# Patient Record
Sex: Male | Born: 1950
Health system: Southern US, Community
[De-identification: ages and names within clinical notes are randomized; demographics above are authoritative.]

## PROBLEM LIST (undated history)

## (undated) DIAGNOSIS — C9110 Chronic lymphocytic leukemia of B-cell type not having achieved remission: Secondary | ICD-10-CM

## (undated) DIAGNOSIS — D72820 Lymphocytosis (symptomatic): Secondary | ICD-10-CM

## (undated) DIAGNOSIS — N400 Enlarged prostate without lower urinary tract symptoms: Secondary | ICD-10-CM

## (undated) DIAGNOSIS — I2510 Atherosclerotic heart disease of native coronary artery without angina pectoris: Secondary | ICD-10-CM

## (undated) DIAGNOSIS — C4491 Basal cell carcinoma of skin, unspecified: Secondary | ICD-10-CM

## (undated) DIAGNOSIS — N529 Male erectile dysfunction, unspecified: Secondary | ICD-10-CM

## (undated) HISTORY — DX: Benign prostatic hyperplasia without lower urinary tract symptoms: N40.0

## (undated) HISTORY — DX: Male erectile dysfunction, unspecified: N52.9

## (undated) HISTORY — PX: LAMINECTOMY: SHX219

## (undated) HISTORY — DX: Basal cell carcinoma of skin, unspecified: C44.91

## (undated) HISTORY — PX: TONSILLECTOMY: SUR1361

---

## 2002-08-28 ENCOUNTER — Emergency Department (HOSPITAL_COMMUNITY): Admission: EM | Admit: 2002-08-28 | Discharge: 2002-08-28 | Payer: Self-pay | Admitting: Emergency Medicine

## 2002-08-28 ENCOUNTER — Encounter: Payer: Self-pay | Admitting: Emergency Medicine

## 2003-09-16 ENCOUNTER — Ambulatory Visit (HOSPITAL_COMMUNITY): Admission: RE | Admit: 2003-09-16 | Discharge: 2003-09-16 | Payer: Self-pay | Admitting: Neurosurgery

## 2006-01-03 ENCOUNTER — Ambulatory Visit (HOSPITAL_COMMUNITY): Admission: RE | Admit: 2006-01-03 | Discharge: 2006-01-03 | Payer: Self-pay | Admitting: Orthopedic Surgery

## 2013-02-07 ENCOUNTER — Ambulatory Visit (INDEPENDENT_AMBULATORY_CARE_PROVIDER_SITE_OTHER): Payer: BC Managed Care – PPO | Admitting: Physician Assistant

## 2013-02-07 VITALS — BP 130/80 | HR 80 | Temp 97.8°F | Resp 16 | Ht 74.0 in | Wt 180.0 lb

## 2013-02-07 DIAGNOSIS — R350 Frequency of micturition: Secondary | ICD-10-CM

## 2013-02-07 DIAGNOSIS — R300 Dysuria: Secondary | ICD-10-CM

## 2013-02-07 DIAGNOSIS — N390 Urinary tract infection, site not specified: Secondary | ICD-10-CM

## 2013-02-07 LAB — POCT URINALYSIS DIPSTICK
Bilirubin, UA: NEGATIVE
Glucose, UA: NEGATIVE
Ketones, UA: NEGATIVE
Nitrite, UA: NEGATIVE
Protein, UA: NEGATIVE
Spec Grav, UA: <=1.005
Urobilinogen, UA: 0.2
pH, UA: 6

## 2013-02-07 LAB — POCT UA - MICROSCOPIC ONLY
Casts, Ur, LPF, POC: NEGATIVE
Crystals, Ur, HPF, POC: NEGATIVE
Mucus, UA: NEGATIVE
Yeast, UA: NEGATIVE

## 2013-02-07 MED ORDER — CIPROFLOXACIN HCL 500 MG PO TABS: 500.0000 mg | ORAL_TABLET | Freq: Two times a day (BID) | ORAL | Status: DC

## 2013-02-07 MED ORDER — PHENAZOPYRIDINE HCL 200 MG PO TABS: 200.0000 mg | ORAL_TABLET | Freq: Three times a day (TID) | ORAL | Status: DC | PRN

## 2013-02-08 NOTE — Progress Notes (Signed)
Patient ID: Jake Kennedy MRN: [id number], DOB: 1950-10-18, 63 y.o. Date of Encounter: 02/08/2013, 11:34 AM  Primary Physician: No primary provider on file.  Chief Complaint: urinary frequency and dysuria  HPI: 63 y.o. year old male with presents with 5-6 day history of urinary frequency, dysuria, and suprapubic pressure. Denies flank pain, nausea, vomiting, fever, chills, abdominal pain. Has tried drinking cranberry juice. Believes that symptoms started after taking Tamiflu 1 week ago.  Has also taken OTC cold medications. Has had hx of difficulty urinating after taking OTC preparations - typically resolves spontaneously. He is married and has no concerns about STI's.  Patient is otherwise doing well without issues or complaints.  History reviewed. No pertinent past medical history.   Home Meds: Prior to Admission medications   Medication Sig Start Date End Date Taking? Authorizing Provider  aspirin 81 MG tablet Take 81 mg by mouth daily.   Yes Historical Provider, MD  beta carotene w/minerals (OCUVITE) tablet Take 1 tablet by mouth daily.   Yes Historical Provider, MD  Multiple Vitamin (MULTIVITAMIN) tablet Take 1 tablet by mouth daily.   Yes Historical Provider, MD  Omega-3 Fatty Acids (FISH OIL CONCENTRATE PO) Take by mouth.   Yes Historical Provider, MD  ciprofloxacin (CIPRO) 500 MG tablet Take 1 tablet (500 mg total) by mouth 2 (two) times daily. 02/07/13   Shikita Vaillancourt Elnora Morrison, PA-C  phenazopyridine (PYRIDIUM) 200 MG tablet Take 1 tablet (200 mg total) by mouth 3 (three) times daily as needed for pain. 02/07/13   Collene Leyden, PA-C    Allergies: Not on File  History   Social History  . Marital Status: Married    Spouse Name: N/A    Number of Children: N/A  . Years of Education: N/A   Occupational History  . Not on file.   Social History Main Topics  . Smoking status: Never Smoker   . Smokeless tobacco: Not on file  . Alcohol Use: Not on file  . Drug Use: Not on file  . Sexual  Activity: Not on file   Other Topics Concern  . Not on file   Social History Narrative  . No narrative on file     Review of Systems: Constitutional: negative for chills, fever, night sweats, weight changes, or fatigue  HEENT: negative for vision changes, hearing loss, congestion, rhinorrhea, ST, epistaxis, or sinus pressure Cardiovascular: negative for chest pain or palpitations Respiratory: negative for cough, hemoptysis, wheezing, shortness of breath. Abdominal: positive for suprapubic abdominal pain,   No nausea, vomiting, diarrhea, or constipation Genitourinary: positive for urinary frequency and dysuria.   Dermatological: negative for rashes. Neurologic: negative for headache, dizziness, or syncope   Physical Exam: Blood pressure 130/80, pulse 80, temperature 97.8 F (36.6 C), temperature source Oral, resp. rate 16, height 6\' 2"  (1.88 m), weight 180 lb (81.647 kg), SpO2 97.00%., Body mass index is 23.1 kg/(m^2). General: Well developed, well nourished, in no acute distress. Head: Normocephalic, atraumatic, eyes without discharge, sclera non-icteric, nares are without discharge. External ear normal in appearance. Neck: Supple. No thyromegaly. Full ROM. No lymphadenopathy. Lungs: Clear bilaterally to auscultation without wheezes, rales, or rhonchi. Breathing is unlabored. Heart: RRR with S1 S2. No murmurs, rubs, or gallops appreciated. Abdominal: +BS x 4. No hepatosplenomegaly, rebound tenderness, or guarding. Positive suprapubic tenderness. No CVA tenderness bilaterally.  Msk:  Strength and tone normal for age. Extremities/Skin: Warm and dry. No clubbing or cyanosis. No edema.  Neuro: Alert and oriented X 3. Moves all extremities  spontaneously. Gait is normal. CNII-XII grossly in tact. Psych:  Responds to questions appropriately with a normal affect.   Labs: Results for orders placed in visit on 02/07/13  POCT URINALYSIS DIPSTICK      Result Value Range   Color, UA light  yellow     Clarity, UA clear     Glucose, UA neg     Bilirubin, UA neg     Ketones, UA neg     Spec Grav, UA <=1.005     Blood, UA trace-lysed     pH, UA 6.0     Protein, UA neg     Urobilinogen, UA 0.2     Nitrite, UA neg     Leukocytes, UA moderate (2+)    POCT UA - MICROSCOPIC ONLY      Result Value Range   WBC, Ur, HPF, POC 3-5     RBC, urine, microscopic 1-3     Bacteria, U Microscopic trace     Mucus, UA neg     Epithelial cells, urine per micros 0-1     Crystals, Ur, HPF, POC neg     Casts, Ur, LPF, POC neg     Yeast, UA neg       ASSESSMENT AND PLAN:  63 y.o. year old male with urinary tract infection Urine culture sent Increase fluids Cipro 500 mg bid x 14 days Pyridium tid as needed for symptomatic relief Follow up if symptoms worsen or fail to improve.  Angela Nevin, PA-C 02/08/2013 11:34 AM

## 2013-02-09 ENCOUNTER — Telehealth: Payer: Self-pay | Admitting: Radiology

## 2013-02-09 NOTE — Telephone Encounter (Signed)
Pt notified. Still symptomatic but he is getting better. Let him know we would call him when the urine culture was back

## 2013-02-09 NOTE — Telephone Encounter (Signed)
Cx is not final yet, await sensitivity.  He will be notified when results are final

## 2013-02-09 NOTE — Telephone Encounter (Signed)
Patient has called wanting results for his urine culture, please review and advise.

## 2013-02-10 LAB — URINE CULTURE: Colony Count: 40000

## 2013-02-11 ENCOUNTER — Encounter: Payer: Self-pay | Admitting: Family Medicine

## 2015-01-31 ENCOUNTER — Telehealth (HOSPITAL_COMMUNITY): Payer: Self-pay | Admitting: *Deleted

## 2015-01-31 ENCOUNTER — Other Ambulatory Visit (HOSPITAL_COMMUNITY): Payer: Self-pay | Admitting: Internal Medicine

## 2015-01-31 DIAGNOSIS — R079 Chest pain, unspecified: Secondary | ICD-10-CM

## 2015-01-31 NOTE — Telephone Encounter (Signed)
Patient given detailed instructions per Myocardial Perfusion Study Information Sheet for the test on 02/02/15 at 0745. Patient notified to arrive 15 minutes early and that it is imperative to arrive on time for appointment to keep from having the test rescheduled.  If you need to cancel or reschedule your appointment, please call the office within 24 hours of your appointment. Failure to do so may result in a cancellation of your appointment, and a $50 no show fee. Patient verbalized understanding.Donta Fuster, Ranae Palms

## 2015-02-02 ENCOUNTER — Ambulatory Visit (HOSPITAL_COMMUNITY): Payer: BLUE CROSS/BLUE SHIELD | Attending: Cardiovascular Disease

## 2015-02-02 ENCOUNTER — Encounter: Payer: Self-pay | Admitting: Physician Assistant

## 2015-02-02 ENCOUNTER — Ambulatory Visit (INDEPENDENT_AMBULATORY_CARE_PROVIDER_SITE_OTHER): Payer: BLUE CROSS/BLUE SHIELD | Admitting: Physician Assistant

## 2015-02-02 ENCOUNTER — Other Ambulatory Visit: Payer: Self-pay | Admitting: Physician Assistant

## 2015-02-02 VITALS — BP 140/87 | HR 56 | Ht 74.0 in | Wt 180.0 lb

## 2015-02-02 DIAGNOSIS — R079 Chest pain, unspecified: Secondary | ICD-10-CM

## 2015-02-02 DIAGNOSIS — R9439 Abnormal result of other cardiovascular function study: Secondary | ICD-10-CM | POA: Diagnosis not present

## 2015-02-02 LAB — MYOCARDIAL PERFUSION IMAGING
CHL CUP RESTING HR STRESS: 56 {beats}/min
CHL RATE OF PERCEIVED EXERTION: 18
CSEPED: 10 min
CSEPEDS: 45 s
CSEPEW: 12.9 METS
CSEPPHR: 137 {beats}/min
LV dias vol: 135 mL
LVSYSVOL: 65 mL
MPHR: 156 {beats}/min
Percent HR: 88 %
RATE: 0.24
SDS: 2
SRS: 12
SSS: 14
TID: 0.97

## 2015-02-02 MED ORDER — TECHNETIUM TC 99M SESTAMIBI GENERIC - CARDIOLITE
10.2000 | Freq: Once | INTRAVENOUS | Status: AC | PRN
  Administered 2015-02-02: 10 via INTRAVENOUS

## 2015-02-02 MED ORDER — TECHNETIUM TC 99M SESTAMIBI GENERIC - CARDIOLITE
32.8000 | Freq: Once | INTRAVENOUS | Status: AC | PRN
  Administered 2015-02-02: 32.8 via INTRAVENOUS

## 2015-02-02 MED ORDER — METOPROLOL TARTRATE 25 MG PO TABS: 12.5000 mg | ORAL_TABLET | Freq: Two times a day (BID) | ORAL | Status: DC

## 2015-02-02 MED ORDER — NITROGLYCERIN 0.4 MG SL SUBL: 0.4000 mg | SUBLINGUAL_TABLET | SUBLINGUAL | Status: DC | PRN

## 2015-02-02 NOTE — Progress Notes (Signed)
Cardiology Office Note   Date:  02/02/2015   ID:  Jake Kennedy, DOB [birth year], MRN [id number]  PCP:  Dr. [name] with [facility]  Cardiologist:  New - Dr. Burt Knack  Chief Complaint  Patient presents with  . New Patient (Initial Visit)    seen for Dr. Burt Knack, DOD, add-on after abnormal stress test  . Chest Pain      History of Present Illness: Jake Kennedy is a 64 y.o. male who presents for new office visit directly after he finished treadmill nuc on the same day for abnormal myoview. He started having chest pain on 01/05/2015 while running on the beach. One week later he started having substernal chest discomfort at rest. It is occasionally relieved by burping, he has not noticed other exacerbating or alleviating factors. The symptom was not associated with SOB or diaphoresis. He saw his PCP, Dr. Laurann Kennedy who placed him on Nexium BID dosing. Since he was placed on Nexium, he continue to have intermittent symptom. The last episode was a week ago.   He presents today for outpatient stress test, during the stress test he has chest discomfort on treadmill. EKG portion showed TWI in V1-V2 wilth mild ST elevation during stress and TWI in aVL. Image portion showed some decreased perfusion in inferior and apex with stress, final report pending. Patient was add on to flex schedule for abnormal myoview.  He is currently chest pain free    Past Medical History  Diagnosis Date  . BPH (benign prostatic hypertrophy)   . ED (erectile dysfunction)   . Basal cell carcinoma     moles removed in 2007 and 2012 from L nose and L hip    Past Surgical History  Procedure Laterality Date  . Laminectomy       Current Outpatient Prescriptions  Medication Sig Dispense Refill  . aspirin 81 MG tablet Take 81 mg by mouth daily.    . beta carotene w/minerals (OCUVITE) tablet Take 1 tablet by mouth daily.    . Esomeprazole Magnesium (NEXIUM 24HR PO) Take 1 tablet by mouth 2 (two) times  daily.     Marland Kitchen FLUARIX QUADRIVALENT 0.5 ML injection inject 0.5 milliliter intramuscularly  0  . LEVITRA 20 MG tablet Take 20 mg by mouth daily as needed for erectile dysfunction.   0  . metoprolol tartrate (LOPRESSOR) 25 MG tablet Take 0.5 tablets (12.5 mg total) by mouth 2 (two) times daily. 90 tablet 3  . Multiple Vitamin (MULTIVITAMIN) tablet Take 1 tablet by mouth daily.    . nitroGLYCERIN (NITROSTAT) 0.4 MG SL tablet Place 1 tablet (0.4 mg total) under the tongue every 5 (five) minutes as needed for chest pain. 25 tablet 3  . Omega-3 Fatty Acids (FISH OIL CONCENTRATE PO) Take by mouth.     No current facility-administered medications for this visit.    Allergies:   Latex    Social History:  The patient  reports that he has never smoked. He does not have any smokeless tobacco history on file. He reports that he drinks alcohol. He reports that he does not use illicit drugs.   Family History:  The patient's family history includes Diabetes in his mother; Healthy in his brother, brother, maternal grandfather, maternal grandmother, paternal grandfather, paternal grandmother, and sister; Heart attack in his mother; Heart failure in his mother.    ROS:  Please see the history of present illness.   Otherwise, review of systems are positive for chest pain.  All other systems are reviewed and negative.    PHYSICAL EXAM: VS:  BP 140/87 mmHg  Pulse 56  Ht 6\' 2"  (1.88 m)  Wt 180 lb (81.647 kg)  BMI 23.10 kg/m2 , BMI Body mass index is 23.1 kg/(m^2). GEN: Well nourished, well developed, in no acute distress HEENT: normal Neck: no JVD, carotid bruits, or masses Cardiac: RRR; no murmurs, rubs, or gallops,no edema  Respiratory:  clear to auscultation bilaterally, normal work of breathing GI: soft, nontender, nondistended, + BS MS: no deformity or atrophy Skin: warm and dry, no rash Neuro:  Strength and sensation are intact Psych: euthymic mood, full affect   EKG:  EKG is not ordered today  as patient had EKG during treadmill nuc test   Recent Labs: No results found for requested labs within last 365 days.    Lipid Panel No results found for: CHOL, TRIG, HDL, CHOLHDL, VLDL, LDLCALC, LDLDIRECT    Wt Readings from Last 3 Encounters:  02/02/15 180 lb (81.647 kg)  02/02/15 180 lb (81.647 kg)  02/07/13 180 lb (81.647 kg)      Other studies Reviewed: Additional studies/ records that were reviewed today include:  Abnormal treadmill nuc result, last office visit note  Review of the above records demonstrates:   Intermittent chest pain since 12/1, initially with exertion, then later at rest.    ASSESSMENT AND PLAN:  1.  Abnormal myoview  - symptom concerning for unstable angina, some chest pain with treadmill earlier, resolved now. Start ASA, metorpolol 12.5mg  BID and add PRN nitroglycerine  - Risk and benefit of procedure explained to the patient who display clear understanding and agree to proceed. Discussed with patient possible procedural risk include bleeding, vascular injury, renal injury, arrythmia, MI, stroke and loss of limb or life.  - he says his last lipid test was borderline high, will need to followup on lipid level. Cath arranged with Dr. Burt Knack on 02/09/2014   Current medicines are reviewed at length with the patient today.  The patient does not have concerns regarding medicines.  The following changes have been made:  Add ASA, metoprolol. PRN nitroglycerine  Labs/ tests ordered today include:   Orders Placed This Encounter  Procedures  . CBC w/Diff  . Basic Metabolic Panel (BMET)  . INR/PT     Disposition:   FU with after cardiac catherization.   Hilbert Corrigan PA 02/02/2015 2:03 PM    [facility] [place], [place], [place]  [address] Phone: [phone]; Fax: [phone]

## 2015-02-02 NOTE — Patient Instructions (Addendum)
Medication Instructions:  Your physician has recommended you make the following change in your medication:  1.  START taking Nitroglycerin 0.4 S/L tablet only as needed for chest pain.  Take as directed on the bottle. 2.  START the Metoprolol 25 mg taking 1/2 tablet twice a day  Labwork: Tuesday 02/07/15:  BMET, PT/INR, CBC W/DIFF  Testing/Procedures: Your physician has requested that you have a cardiac catheterization. Please give [place] a call when you are ready to schedule.  Cardiac catheterization is used to diagnose and/or treat various heart conditions. Doctors may recommend this procedure for a number of different reasons. The most common reason is to evaluate chest pain. Chest pain can be a symptom of coronary artery disease (CAD), and cardiac catheterization can show whether plaque is narrowing or blocking your heart's arteries. This procedure is also used to evaluate the valves, as well as measure the blood flow and oxygen levels in different parts of your heart. For further information please visit [email or web address]. Please follow instruction sheet, as given.  You are scheduled for Friday, 02/10/15, arrive at [facility] at registration @ 10:00 a.m.  NOTHING TO EAT OR DRINK AFTER MIDNIGHT THE NIGHT BEFORE, EXCEPT FOR ENOUGH WATER TO TAKE YOUR MEDICATIONS THE MORNING OF YOUR TEST.   Follow-Up: Will depend upon your procedure   Any Other Special Instructions Will Be Listed Below (If Applicable).  Coronary Angiogram A coronary angiogram, also called coronary angiography, is an X-ray procedure used to look at the arteries in the heart. In this procedure, a dye (contrast dye) is injected through a long, hollow tube (catheter). The catheter is about the size of a piece of cooked spaghetti and is inserted through your groin, wrist, or arm. The dye is injected into each artery, and X-rays are then taken to show if there is a blockage in the arteries of your heart. LET [facility] CARE  PROVIDER KNOW ABOUT:  Any allergies you have, including allergies to shellfish or contrast dye.   All medicines you are taking, including vitamins, herbs, eye drops, creams, and over-the-counter medicines.   Previous problems you or members of your family have had with the use of anesthetics.   Any blood disorders you have.   Previous surgeries you have had.  History of kidney problems or failure.   Other medical conditions you have. RISKS AND COMPLICATIONS  Generally, a coronary angiogram is a safe procedure. However, problems can occur and include:  Allergic reaction to the dye.  Bleeding from the access site or other locations.  Kidney injury, especially in people with impaired kidney function.  Stroke (rare).  Heart attack (rare). BEFORE THE PROCEDURE   Do not eat or drink anything after midnight the night before the procedure or as directed by your health care provider.   Ask your health care provider about changing or stopping your regular medicines. This is especially important if you are taking diabetes medicines or blood thinners. PROCEDURE  You may be given a medicine to help you relax (sedative) before the procedure. This medicine is given through an intravenous (IV) access tube that is inserted into one of your veins.   The area where the catheter will be inserted will be washed and shaved. This is usually done in the groin but may be done in the fold of your arm (near your elbow) or in the wrist.   A medicine will be given to numb the area where the catheter will be inserted (local anesthetic).   The  health care provider will insert the catheter into an artery. The catheter will be guided by using a special type of X-ray (fluoroscopy) of the blood vessel being examined.   A special dye will then be injected into the catheter, and X-rays will be taken. The dye will help to show where any narrowing or blockages are located in the heart arteries.   AFTER THE PROCEDURE   If the procedure is done through the leg, you will be kept in bed lying flat for several hours. You will be instructed to not bend or cross your legs.  The insertion site will be checked frequently.   The pulse in your feet or wrist will be checked frequently.   Additional blood tests, X-rays, and an electrocardiogram may be done.    This information is not intended to replace advice given to you by your health care provider. Make sure you discuss any questions you have with your health care provider.   Document Released: 07/28/2002 Document Revised: 02/11/2014 Document Reviewed: 06/15/2012 Elsevier Interactive Patient Education [employer].   If you need a refill on your cardiac medications before your next appointment, please call your pharmacy.

## 2015-02-03 ENCOUNTER — Encounter: Payer: Self-pay | Admitting: Cardiovascular Disease

## 2015-02-03 NOTE — Telephone Encounter (Signed)
This encounter was created in error - please disregard.

## 2015-02-03 NOTE — Telephone Encounter (Signed)
°  New Prob   Pt is requesting a sooner appointment for cardiac cath if possible. Please call.

## 2015-02-07 ENCOUNTER — Encounter (HOSPITAL_COMMUNITY): Admission: RE | Payer: Self-pay | Source: Ambulatory Visit

## 2015-02-07 ENCOUNTER — Other Ambulatory Visit (INDEPENDENT_AMBULATORY_CARE_PROVIDER_SITE_OTHER): Payer: BLUE CROSS/BLUE SHIELD | Admitting: *Deleted

## 2015-02-07 ENCOUNTER — Ambulatory Visit (HOSPITAL_COMMUNITY): Admission: RE | Admit: 2015-02-07 | Payer: BLUE CROSS/BLUE SHIELD | Source: Ambulatory Visit | Admitting: Cardiology

## 2015-02-07 DIAGNOSIS — R079 Chest pain, unspecified: Secondary | ICD-10-CM | POA: Diagnosis not present

## 2015-02-07 DIAGNOSIS — R9439 Abnormal result of other cardiovascular function study: Secondary | ICD-10-CM | POA: Diagnosis not present

## 2015-02-07 LAB — BASIC METABOLIC PANEL
BUN: 19 mg/dL (ref 7–25)
CALCIUM: 8.8 mg/dL (ref 8.6–10.3)
CHLORIDE: 103 mmol/L (ref 98–110)
CO2: 26 mmol/L (ref 20–31)
CREATININE: 0.89 mg/dL (ref 0.70–1.25)
GLUCOSE: 77 mg/dL (ref 65–99)
Potassium: 4.5 mmol/L (ref 3.5–5.3)
Sodium: 140 mmol/L (ref 135–146)

## 2015-02-07 SURGERY — LEFT HEART CATH AND CORONARY ANGIOGRAPHY
Anesthesia: LOCAL

## 2015-02-07 NOTE — Addendum Note (Signed)
Addended by: Eulis Foster on: 02/07/2015 10:56 AM   Modules accepted: Orders

## 2015-02-08 LAB — CBC WITH DIFFERENTIAL/PLATELET
Basophils Absolute: 0 10*3/uL (ref 0.0–0.1)
Basophils Relative: 0 % (ref 0–1)
EOS ABS: 0.2 10*3/uL (ref 0.0–0.7)
Eosinophils Relative: 1 % (ref 0–5)
HEMATOCRIT: 43.6 % (ref 39.0–52.0)
Hemoglobin: 15.2 g/dL (ref 13.0–17.0)
LYMPHS ABS: 12.7 10*3/uL — AB (ref 0.7–4.0)
LYMPHS PCT: 68 % — AB (ref 12–46)
MCH: 31.1 pg (ref 26.0–34.0)
MCHC: 34.9 g/dL (ref 30.0–36.0)
MCV: 89.3 fL (ref 78.0–100.0)
MONOS PCT: 10 % (ref 3–12)
MPV: 10.5 fL (ref 8.6–12.4)
Monocytes Absolute: 1.9 10*3/uL — ABNORMAL HIGH (ref 0.1–1.0)
NEUTROS PCT: 21 % — AB (ref 43–77)
Neutro Abs: 3.9 10*3/uL (ref 1.7–7.7)
PLATELETS: 225 10*3/uL (ref 150–400)
RBC: 4.88 MIL/uL (ref 4.22–5.81)
RDW: 14.1 % (ref 11.5–15.5)
WBC: 18.7 10*3/uL — ABNORMAL HIGH (ref 4.0–10.5)

## 2015-02-08 LAB — PROTIME-INR
INR: 0.89 (ref <1.50)
Prothrombin Time: 12.1 seconds (ref 11.6–15.2)

## 2015-02-08 LAB — PATHOLOGIST SMEAR REVIEW

## 2015-02-10 ENCOUNTER — Ambulatory Visit (HOSPITAL_COMMUNITY)
Admission: RE | Admit: 2015-02-10 | Discharge: 2015-02-11 | Disposition: A | Discharge status: Home / Self Care | Payer: BLUE CROSS/BLUE SHIELD | Source: Ambulatory Visit | Attending: Cardiovascular Disease | Admitting: Cardiovascular Disease

## 2015-02-10 ENCOUNTER — Other Ambulatory Visit: Payer: Self-pay

## 2015-02-10 ENCOUNTER — Encounter (HOSPITAL_COMMUNITY)
Admission: RE | Disposition: A | Discharge status: Home / Self Care | Payer: Self-pay | Source: Ambulatory Visit | Attending: Cardiovascular Disease

## 2015-02-10 ENCOUNTER — Encounter (HOSPITAL_COMMUNITY): Payer: Self-pay | Admitting: General Practice

## 2015-02-10 ENCOUNTER — Ambulatory Visit (HOSPITAL_COMMUNITY): Payer: BLUE CROSS/BLUE SHIELD

## 2015-02-10 DIAGNOSIS — N400 Enlarged prostate without lower urinary tract symptoms: Secondary | ICD-10-CM | POA: Insufficient documentation

## 2015-02-10 DIAGNOSIS — D729 Disorder of white blood cells, unspecified: Secondary | ICD-10-CM | POA: Insufficient documentation

## 2015-02-10 DIAGNOSIS — I208 Other forms of angina pectoris: Secondary | ICD-10-CM | POA: Diagnosis present

## 2015-02-10 DIAGNOSIS — Z85828 Personal history of other malignant neoplasm of skin: Secondary | ICD-10-CM | POA: Insufficient documentation

## 2015-02-10 DIAGNOSIS — I25118 Atherosclerotic heart disease of native coronary artery with other forms of angina pectoris: Secondary | ICD-10-CM

## 2015-02-10 DIAGNOSIS — I25110 Atherosclerotic heart disease of native coronary artery with unstable angina pectoris: Secondary | ICD-10-CM | POA: Insufficient documentation

## 2015-02-10 DIAGNOSIS — Z91040 Latex allergy status: Secondary | ICD-10-CM | POA: Insufficient documentation

## 2015-02-10 DIAGNOSIS — D72820 Lymphocytosis (symptomatic): Secondary | ICD-10-CM | POA: Insufficient documentation

## 2015-02-10 DIAGNOSIS — N529 Male erectile dysfunction, unspecified: Secondary | ICD-10-CM | POA: Insufficient documentation

## 2015-02-10 DIAGNOSIS — Z8249 Family history of ischemic heart disease and other diseases of the circulatory system: Secondary | ICD-10-CM | POA: Diagnosis not present

## 2015-02-10 DIAGNOSIS — Z7982 Long term (current) use of aspirin: Secondary | ICD-10-CM | POA: Diagnosis not present

## 2015-02-10 DIAGNOSIS — R9439 Abnormal result of other cardiovascular function study: Secondary | ICD-10-CM

## 2015-02-10 HISTORY — PX: CARDIAC CATHETERIZATION: SHX172

## 2015-02-10 HISTORY — DX: Atherosclerotic heart disease of native coronary artery without angina pectoris: I25.10

## 2015-02-10 HISTORY — PX: CORONARY STENT PLACEMENT: SHX1402

## 2015-02-10 HISTORY — DX: Lymphocytosis (symptomatic): D72.820

## 2015-02-10 LAB — CBC WITH DIFFERENTIAL/PLATELET
BASOS ABS: 0 10*3/uL (ref 0.0–0.1)
BASOS PCT: 0 %
EOS ABS: 0.2 10*3/uL (ref 0.0–0.7)
Eosinophils Relative: 1 %
HCT: 48.9 % (ref 39.0–52.0)
Hemoglobin: 15.9 g/dL (ref 13.0–17.0)
LYMPHS PCT: 69 %
Lymphs Abs: 12.2 10*3/uL — ABNORMAL HIGH (ref 0.7–4.0)
MCH: 30.6 pg (ref 26.0–34.0)
MCHC: 32.5 g/dL (ref 30.0–36.0)
MCV: 94 fL (ref 78.0–100.0)
MONO ABS: 1.2 10*3/uL — AB (ref 0.1–1.0)
Monocytes Relative: 7 %
NEUTROS ABS: 4 10*3/uL (ref 1.7–7.7)
Neutrophils Relative %: 23 %
PLATELETS: 196 10*3/uL (ref 150–400)
RBC: 5.2 MIL/uL (ref 4.22–5.81)
RDW: 13.5 % (ref 11.5–15.5)
WBC: 17.6 10*3/uL — ABNORMAL HIGH (ref 4.0–10.5)

## 2015-02-10 LAB — URINALYSIS, ROUTINE W REFLEX MICROSCOPIC
BILIRUBIN URINE: NEGATIVE
GLUCOSE, UA: NEGATIVE mg/dL
Hgb urine dipstick: NEGATIVE
KETONES UR: NEGATIVE mg/dL
LEUKOCYTES UA: NEGATIVE
NITRITE: NEGATIVE
PH: 6.5 (ref 5.0–8.0)
PROTEIN: NEGATIVE mg/dL
Specific Gravity, Urine: 1.016 (ref 1.005–1.030)

## 2015-02-10 LAB — POCT ACTIVATED CLOTTING TIME
ACTIVATED CLOTTING TIME: 209 s
ACTIVATED CLOTTING TIME: 255 s

## 2015-02-10 LAB — PATHOLOGIST SMEAR REVIEW

## 2015-02-10 SURGERY — LEFT HEART CATH AND CORONARY ANGIOGRAPHY

## 2015-02-10 MED ORDER — METOPROLOL TARTRATE 12.5 MG HALF TABLET
12.5000 mg | ORAL_TABLET | Freq: Two times a day (BID) | ORAL | Status: DC
  Administered 2015-02-11: 12.5 mg via ORAL
  Filled 2015-02-10: qty 1

## 2015-02-10 MED ORDER — HEPARIN SODIUM (PORCINE) 1000 UNIT/ML IJ SOLN
INTRAMUSCULAR | Status: AC
  Filled 2015-02-10: qty 1

## 2015-02-10 MED ORDER — MIDAZOLAM HCL 2 MG/2ML IJ SOLN
INTRAMUSCULAR | Status: AC
  Filled 2015-02-10: qty 2

## 2015-02-10 MED ORDER — ONDANSETRON HCL 4 MG/2ML IJ SOLN: 4.0000 mg | Freq: Four times a day (QID) | INTRAMUSCULAR | Status: DC | PRN

## 2015-02-10 MED ORDER — SODIUM CHLORIDE 0.9 % IV SOLN: 250.0000 mL | INTRAVENOUS | Status: DC | PRN

## 2015-02-10 MED ORDER — SODIUM CHLORIDE 0.9 % IJ SOLN: 3.0000 mL | Freq: Two times a day (BID) | INTRAMUSCULAR | Status: DC

## 2015-02-10 MED ORDER — HEPARIN SODIUM (PORCINE) 1000 UNIT/ML IJ SOLN
INTRAMUSCULAR | Status: DC | PRN
  Administered 2015-02-10: 5000 [IU] via INTRAVENOUS
  Administered 2015-02-10: 4000 [IU] via INTRAVENOUS
  Administered 2015-02-10: 3000 [IU] via INTRAVENOUS

## 2015-02-10 MED ORDER — SODIUM CHLORIDE 0.9 % IJ SOLN
3.0000 mL | Freq: Two times a day (BID) | INTRAMUSCULAR | Status: DC
  Administered 2015-02-10: 3 mL via INTRAVENOUS

## 2015-02-10 MED ORDER — SODIUM CHLORIDE 0.9 % IV SOLN: INTRAVENOUS | Status: AC

## 2015-02-10 MED ORDER — CLOPIDOGREL BISULFATE 300 MG PO TABS
ORAL_TABLET | ORAL | Status: AC
  Filled 2015-02-10: qty 2

## 2015-02-10 MED ORDER — CLOPIDOGREL BISULFATE 75 MG PO TABS
75.0000 mg | ORAL_TABLET | Freq: Every day | ORAL | Status: DC
  Administered 2015-02-11: 75 mg via ORAL
  Filled 2015-02-10: qty 1

## 2015-02-10 MED ORDER — FENTANYL CITRATE (PF) 100 MCG/2ML IJ SOLN
INTRAMUSCULAR | Status: AC
  Filled 2015-02-10: qty 2

## 2015-02-10 MED ORDER — ASPIRIN 81 MG PO CHEW: 81.0000 mg | CHEWABLE_TABLET | ORAL | Status: DC

## 2015-02-10 MED ORDER — CLOPIDOGREL BISULFATE 300 MG PO TABS
ORAL_TABLET | ORAL | Status: DC | PRN
  Administered 2015-02-10: 600 mg via ORAL

## 2015-02-10 MED ORDER — NITROGLYCERIN 1 MG/10 ML FOR IR/CATH LAB
INTRA_ARTERIAL | Status: DC | PRN
  Administered 2015-02-10: 14:00:00

## 2015-02-10 MED ORDER — ATORVASTATIN CALCIUM 80 MG PO TABS
80.0000 mg | ORAL_TABLET | Freq: Every day | ORAL | Status: DC
  Administered 2015-02-10: 80 mg via ORAL
  Filled 2015-02-10: qty 1

## 2015-02-10 MED ORDER — MIDAZOLAM HCL 2 MG/2ML IJ SOLN
INTRAMUSCULAR | Status: DC | PRN
Start: 2015-02-10 — End: 2015-02-10
  Administered 2015-02-10: 2 mg via INTRAVENOUS

## 2015-02-10 MED ORDER — HEPARIN (PORCINE) IN NACL 2-0.9 UNIT/ML-% IJ SOLN
INTRAMUSCULAR | Status: AC
  Filled 2015-02-10: qty 1000

## 2015-02-10 MED ORDER — SODIUM CHLORIDE 0.9 % WEIGHT BASED INFUSION: 3.0000 mL/kg/h | INTRAVENOUS | Status: DC

## 2015-02-10 MED ORDER — ASPIRIN 81 MG PO CHEW
81.0000 mg | CHEWABLE_TABLET | Freq: Every day | ORAL | Status: DC
  Administered 2015-02-11: 81 mg via ORAL
  Filled 2015-02-10: qty 1

## 2015-02-10 MED ORDER — SODIUM CHLORIDE 0.9 % WEIGHT BASED INFUSION: 1.0000 mL/kg/h | INTRAVENOUS | Status: DC

## 2015-02-10 MED ORDER — SODIUM CHLORIDE 0.9 % IJ SOLN: 3.0000 mL | INTRAMUSCULAR | Status: DC | PRN

## 2015-02-10 MED ORDER — VERAPAMIL HCL 2.5 MG/ML IV SOLN
INTRAVENOUS | Status: AC
  Filled 2015-02-10: qty 2

## 2015-02-10 MED ORDER — IOHEXOL 350 MG/ML SOLN
INTRAVENOUS | Status: DC | PRN
  Administered 2015-02-10: 110 mL via INTRA_ARTERIAL

## 2015-02-10 MED ORDER — ACETAMINOPHEN 325 MG PO TABS: 650.0000 mg | ORAL_TABLET | ORAL | Status: DC | PRN

## 2015-02-10 MED ORDER — MORPHINE SULFATE (PF) 2 MG/ML IV SOLN: 2.0000 mg | INTRAVENOUS | Status: DC | PRN

## 2015-02-10 MED ORDER — ANGIOPLASTY BOOK
Freq: Once | Status: AC
  Administered 2015-02-10: 20:00:00
  Filled 2015-02-10: qty 1

## 2015-02-10 MED ORDER — FENTANYL CITRATE (PF) 100 MCG/2ML IJ SOLN
INTRAMUSCULAR | Status: DC | PRN
  Administered 2015-02-10: 25 ug via INTRAVENOUS

## 2015-02-10 MED ORDER — LIDOCAINE HCL (PF) 1 % IJ SOLN
INTRAMUSCULAR | Status: AC
  Filled 2015-02-10: qty 30

## 2015-02-10 MED ORDER — VERAPAMIL HCL 2.5 MG/ML IV SOLN
INTRAVENOUS | Status: DC | PRN
  Administered 2015-02-10: 14:00:00 via INTRA_ARTERIAL

## 2015-02-10 MED ORDER — OXYCODONE-ACETAMINOPHEN 5-325 MG PO TABS: 1.0000 | ORAL_TABLET | ORAL | Status: DC | PRN

## 2015-02-10 SURGICAL SUPPLY — 22 items
BALLN EMERGE MR 2.5X12 (BALLOONS) ×2
BALLN ~~LOC~~ EMERGE MR 3.25X12 (BALLOONS) ×2
BALLOON EMERGE MR 2.5X12 (BALLOONS) IMPLANT
BALLOON ~~LOC~~ EMERGE MR 3.25X12 (BALLOONS) IMPLANT
CATH INFINITI 5 FR AL2 (CATHETERS) ×1 IMPLANT
CATH INFINITI 5 FR JL3.5 (CATHETERS) ×2 IMPLANT
CATH INFINITI 5FR AL1 (CATHETERS) ×1 IMPLANT
CATH INFINITI 5FR ANG PIGTAIL (CATHETERS) ×2 IMPLANT
CATH INFINITI JR4 5F (CATHETERS) ×2 IMPLANT
CATH VISTA GUIDE 6FR XBLAD3.5 (CATHETERS) ×1 IMPLANT
DEVICE RAD COMP TR BAND LRG (VASCULAR PRODUCTS) ×2 IMPLANT
GLIDESHEATH SLEND SS 6F .021 (SHEATH) ×2 IMPLANT
KIT ENCORE 26 ADVANTAGE (KITS) ×1 IMPLANT
KIT HEART LEFT (KITS) ×2 IMPLANT
PACK CARDIAC CATHETERIZATION (CUSTOM PROCEDURE TRAY) ×2 IMPLANT
STENT PROMUS PREM MR 3.0X16 (Permanent Stent) ×1 IMPLANT
STOPCOCK MORSE 400PSI 3WAY (MISCELLANEOUS) ×1 IMPLANT
TRANSDUCER W/STOPCOCK (MISCELLANEOUS) ×2 IMPLANT
TUBING CIL FLEX 10 FLL-RA (TUBING) ×2 IMPLANT
VALVE MANIFOLD 3 PORT W/RA/ON (MISCELLANEOUS) ×1 IMPLANT
WIRE COUGAR XT STRL 190CM (WIRE) ×1 IMPLANT
WIRE SAFE-T 1.5MM-J .035X260CM (WIRE) ×2 IMPLANT

## 2015-02-10 NOTE — H&P (View-Only) (Signed)
Cardiology Office Note   Date:  02/02/2015   ID:  Keywan Reau, DOB 1950-11-11, MRN [id number]  PCP:  Dr. [name] with [facility]  Cardiologist:  New - Dr. Burt Knack  Chief Complaint  Patient presents with  . New Patient (Initial Visit)    seen for Dr. Burt Knack, DOD, add-on after abnormal stress test  . Chest Pain      History of Present Illness: Jake Kennedy is a 65 y.o. male who presents for new office visit directly after he finished treadmill nuc on the same day for abnormal myoview. He started having chest pain on 01/05/2015 while running on the beach. One week later he started having substernal chest discomfort at rest. It is occasionally relieved by burping, he has not noticed other exacerbating or alleviating factors. The symptom was not associated with SOB or diaphoresis. He saw his PCP, Dr. Laurann Montana who placed him on Nexium BID dosing. Since he was placed on Nexium, he continue to have intermittent symptom. The last episode was a week ago.   He presents today for outpatient stress test, during the stress test he has chest discomfort on treadmill. EKG portion showed TWI in V1-V2 wilth mild ST elevation during stress and TWI in aVL. Image portion showed some decreased perfusion in inferior and apex with stress, final report pending. Patient was add on to flex schedule for abnormal myoview.  He is currently chest pain free    Past Medical History  Diagnosis Date  . BPH (benign prostatic hypertrophy)   . ED (erectile dysfunction)   . Basal cell carcinoma     moles removed in 2007 and 2012 from L nose and L hip    Past Surgical History  Procedure Laterality Date  . Laminectomy       Current Outpatient Prescriptions  Medication Sig Dispense Refill  . aspirin 81 MG tablet Take 81 mg by mouth daily.    . beta carotene w/minerals (OCUVITE) tablet Take 1 tablet by mouth daily.    . Esomeprazole Magnesium (NEXIUM 24HR PO) Take 1 tablet by mouth 2 (two) times  daily.     Marland Kitchen FLUARIX QUADRIVALENT 0.5 ML injection inject 0.5 milliliter intramuscularly  0  . LEVITRA 20 MG tablet Take 20 mg by mouth daily as needed for erectile dysfunction.   0  . metoprolol tartrate (LOPRESSOR) 25 MG tablet Take 0.5 tablets (12.5 mg total) by mouth 2 (two) times daily. 90 tablet 3  . Multiple Vitamin (MULTIVITAMIN) tablet Take 1 tablet by mouth daily.    . nitroGLYCERIN (NITROSTAT) 0.4 MG SL tablet Place 1 tablet (0.4 mg total) under the tongue every 5 (five) minutes as needed for chest pain. 25 tablet 3  . Omega-3 Fatty Acids (FISH OIL CONCENTRATE PO) Take by mouth.     No current facility-administered medications for this visit.    Allergies:   Latex    Social History:  The patient  reports that he has never smoked. He does not have any smokeless tobacco history on file. He reports that he drinks alcohol. He reports that he does not use illicit drugs.   Family History:  The patient's family history includes Diabetes in his mother; Healthy in his brother, brother, maternal grandfather, maternal grandmother, paternal grandfather, paternal grandmother, and sister; Heart attack in his mother; Heart failure in his mother.    ROS:  Please see the history of present illness.   Otherwise, review of systems are positive for chest pain.  All other systems are reviewed and negative.    PHYSICAL EXAM: VS:  BP 140/87 mmHg  Pulse 56  Ht 6\' 2"  (1.88 m)  Wt 180 lb (81.647 kg)  BMI 23.10 kg/m2 , BMI Body mass index is 23.1 kg/(m^2). GEN: Well nourished, well developed, in no acute distress HEENT: normal Neck: no JVD, carotid bruits, or masses Cardiac: RRR; no murmurs, rubs, or gallops,no edema  Respiratory:  clear to auscultation bilaterally, normal work of breathing GI: soft, nontender, nondistended, + BS MS: no deformity or atrophy Skin: warm and dry, no rash Neuro:  Strength and sensation are intact Psych: euthymic mood, full affect   EKG:  EKG is not ordered today  as patient had EKG during treadmill nuc test   Recent Labs: No results found for requested labs within last 365 days.    Lipid Panel No results found for: CHOL, TRIG, HDL, CHOLHDL, VLDL, LDLCALC, LDLDIRECT    Wt Readings from Last 3 Encounters:  02/02/15 180 lb (81.647 kg)  02/02/15 180 lb (81.647 kg)  02/07/13 180 lb (81.647 kg)      Other studies Reviewed: Additional studies/ records that were reviewed today include:  Abnormal treadmill nuc result, last office visit note  Review of the above records demonstrates:   Intermittent chest pain since 12/1, initially with exertion, then later at rest.    ASSESSMENT AND PLAN:  1.  Abnormal myoview  - symptom concerning for unstable angina, some chest pain with treadmill earlier, resolved now. Start ASA, metorpolol 12.5mg  BID and add PRN nitroglycerine  - Risk and benefit of procedure explained to the patient who display clear understanding and agree to proceed. Discussed with patient possible procedural risk include bleeding, vascular injury, renal injury, arrythmia, MI, stroke and loss of limb or life.  - he says his last lipid test was borderline high, will need to followup on lipid level. Cath arranged with Dr. Burt Knack on 02/09/2014   Current medicines are reviewed at length with the patient today.  The patient does not have concerns regarding medicines.  The following changes have been made:  Add ASA, metoprolol. PRN nitroglycerine  Labs/ tests ordered today include:   Orders Placed This Encounter  Procedures  . CBC w/Diff  . Basic Metabolic Panel (BMET)  . INR/PT     Disposition:   FU with after cardiac catherization.   Hilbert Corrigan PA 02/02/2015 2:03 PM    [facility] [place], [place], [place]  [address] Phone: [phone]; Fax: [phone]

## 2015-02-10 NOTE — Research (Signed)
CADLAD Informed Consent   Subject Name: Jake Kennedy  Subject met inclusion and exclusion criteria.  The informed consent form, study requirements and expectations were reviewed with the subject and questions and concerns were addressed prior to the signing of the consent form.  The subject verbalized understanding of the trail requirements.  The subject agreed to participate in the CADLAD trial and signed the informed consent.  The informed consent was obtained prior to performance of any protocol-specific procedures for the subject.  A copy of the signed informed consent was given to the subject and a copy was placed in the subject's medical record.  Jake Bathe Jr. 02/10/2015, 1134 am

## 2015-02-10 NOTE — Progress Notes (Signed)
TR BAND REMOVAL  LOCATION:    right radial  DEFLATED PER PROTOCOL:    Yes.    TIME BAND OFF / DRESSING APPLIED:    1830   SITE UPON ARRIVAL:    Level 1(slight  Swelling/puffinness)  SITE AFTER BAND REMOVAL:    Level 0  CIRCULATION SENSATION AND MOVEMENT:    Within Normal Limits   Yes.    COMMENTS:    Tolerated procedure well

## 2015-02-10 NOTE — Interval H&P Note (Signed)
Cath Lab Visit (complete for each Cath Lab visit)  Clinical Evaluation Leading to the Procedure:   ACS: No.  Non-ACS:    Anginal Classification: CCS III  Anti-ischemic medical therapy: Minimal Therapy (1 class of medications)  Non-Invasive Test Results: High-risk stress test findings: cardiac mortality >3%/year  Prior CABG: No previous CABG      History and Physical Interval Note:  02/10/2015 1:26 PM  Dirk Tessmann  has presented today for surgery, with the diagnosis of cp  The various methods of treatment have been discussed with the patient and family. After consideration of risks, benefits and other options for treatment, the patient has consented to  Procedure(s): Left Heart Cath and Coronary Angiography (N/A) as a surgical intervention .  The patient's history has been reviewed, patient examined, no change in status, stable for surgery.  I have reviewed the patient's chart and labs.  Questions were answered to the patient's satisfaction.     Sherren Mocha

## 2015-02-11 ENCOUNTER — Other Ambulatory Visit: Payer: Self-pay | Admitting: Physician Assistant

## 2015-02-11 ENCOUNTER — Other Ambulatory Visit: Payer: Self-pay

## 2015-02-11 ENCOUNTER — Encounter (HOSPITAL_COMMUNITY): Payer: Self-pay | Admitting: Cardiology

## 2015-02-11 DIAGNOSIS — C9110 Chronic lymphocytic leukemia of B-cell type not having achieved remission: Secondary | ICD-10-CM | POA: Diagnosis not present

## 2015-02-11 DIAGNOSIS — I208 Other forms of angina pectoris: Secondary | ICD-10-CM

## 2015-02-11 DIAGNOSIS — D72820 Lymphocytosis (symptomatic): Secondary | ICD-10-CM | POA: Diagnosis present

## 2015-02-11 DIAGNOSIS — D729 Disorder of white blood cells, unspecified: Secondary | ICD-10-CM

## 2015-02-11 DIAGNOSIS — I25110 Atherosclerotic heart disease of native coronary artery with unstable angina pectoris: Secondary | ICD-10-CM | POA: Diagnosis not present

## 2015-02-11 DIAGNOSIS — N400 Enlarged prostate without lower urinary tract symptoms: Secondary | ICD-10-CM | POA: Diagnosis not present

## 2015-02-11 DIAGNOSIS — Z91040 Latex allergy status: Secondary | ICD-10-CM | POA: Diagnosis not present

## 2015-02-11 DIAGNOSIS — I2510 Atherosclerotic heart disease of native coronary artery without angina pectoris: Secondary | ICD-10-CM

## 2015-02-11 DIAGNOSIS — R9439 Abnormal result of other cardiovascular function study: Secondary | ICD-10-CM

## 2015-02-11 HISTORY — DX: Atherosclerotic heart disease of native coronary artery without angina pectoris: I25.10

## 2015-02-11 HISTORY — DX: Lymphocytosis (symptomatic): D72.820

## 2015-02-11 LAB — BASIC METABOLIC PANEL
Anion gap: 7 (ref 5–15)
BUN: 14 mg/dL (ref 6–20)
CO2: 27 mmol/L (ref 22–32)
Calcium: 8.7 mg/dL — ABNORMAL LOW (ref 8.9–10.3)
Chloride: 107 mmol/L (ref 101–111)
Creatinine, Ser: 0.84 mg/dL (ref 0.61–1.24)
GFR calc Af Amer: >60 mL/min (ref >60)
Glucose, Bld: 102 mg/dL — ABNORMAL HIGH (ref 65–99)
POTASSIUM: 4.4 mmol/L (ref 3.5–5.1)
SODIUM: 141 mmol/L (ref 135–145)

## 2015-02-11 LAB — CBC
HEMATOCRIT: 43.9 % (ref 39.0–52.0)
Hemoglobin: 14.2 g/dL (ref 13.0–17.0)
MCH: 30.2 pg (ref 26.0–34.0)
MCHC: 32.3 g/dL (ref 30.0–36.0)
MCV: 93.4 fL (ref 78.0–100.0)
PLATELETS: 179 10*3/uL (ref 150–400)
RBC: 4.7 MIL/uL (ref 4.22–5.81)
RDW: 13.6 % (ref 11.5–15.5)
WBC: 17 10*3/uL — AB (ref 4.0–10.5)

## 2015-02-11 MED ORDER — ATORVASTATIN CALCIUM 80 MG PO TABS: 80.0000 mg | ORAL_TABLET | Freq: Every day | ORAL | Status: DC

## 2015-02-11 MED ORDER — CLOPIDOGREL BISULFATE 75 MG PO TABS: 75.0000 mg | ORAL_TABLET | Freq: Every day | ORAL | Status: DC

## 2015-02-11 NOTE — Progress Notes (Signed)
SUBJECTIVE:  No complaints  OBJECTIVE:   Vitals:   Filed Vitals:   02/10/15 1928 02/10/15 2000 02/10/15 2127 02/11/15 0300  BP: 99/66   110/67  Pulse: 50 52 48 60  Temp: 97.8 F (36.6 C)   98 F (36.7 C)  TempSrc: Oral   Oral  Resp: 17 18  20   Height:      Weight:    172 lb 9.9 oz (78.3 kg)  SpO2: 96% 95%  95%   I&O's:   Intake/Output Summary (Last 24 hours) at 02/11/15 [id number] Last data filed at 02/11/15 0720  Gross per 24 hour  Intake    500 ml  Output   1475 ml  Net   -975 ml   TELEMETRY: Reviewed telemetry pt in NSR:     PHYSICAL EXAM General: Well developed, well nourished, in no acute distress Head: Eyes PERRLA, No xanthomas.   Normal cephalic and atramatic  Lungs:   Clear bilaterally to auscultation and percussion. Heart:   HRRR S1 S2 Pulses are 2+ & equal. Abdomen: Bowel sounds are positive, abdomen soft and non-tender without masses  Extremities:   No clubbing, cyanosis or edema.  DP +1 Neuro: Alert and oriented X 3. Psych:  Good affect, responds appropriately   LABS: Basic Metabolic Panel:  Recent Labs  02/11/15 0305  NA 141  K 4.4  CL 107  CO2 27  GLUCOSE 102*  BUN 14  CREATININE 0.84  CALCIUM 8.7*   Liver Function Tests: No results for input(s): AST, ALT, ALKPHOS, BILITOT, PROT, ALBUMIN in the last 72 hours. No results for input(s): LIPASE, AMYLASE in the last 72 hours. CBC:  Recent Labs  02/10/15 1125 02/11/15 0305  WBC 17.6* 17.0*  NEUTROABS 4.0  --   HGB 15.9 14.2  HCT 48.9 43.9  MCV 94.0 93.4  PLT 196 179   Cardiac Enzymes: No results for input(s): CKTOTAL, CKMB, CKMBINDEX, TROPONINI in the last 72 hours. BNP: Invalid input(s): POCBNP D-Dimer: No results for input(s): DDIMER in the last 72 hours. Hemoglobin A1C: No results for input(s): HGBA1C in the last 72 hours. Fasting Lipid Panel: No results for input(s): CHOL, HDL, LDLCALC, TRIG, CHOLHDL, LDLDIRECT in the last 72 hours. Thyroid Function Tests: No results for  input(s): TSH, T4TOTAL, T3FREE, THYROIDAB in the last 72 hours.  Invalid input(s): FREET3 Anemia Panel: No results for input(s): VITAMINB12, FOLATE, FERRITIN, TIBC, IRON, RETICCTPCT in the last 72 hours. Coag Panel:   Lab Results  Component Value Date   INR 0.89 02/07/2015    RADIOLOGY: Dg Chest 2 View  02/10/2015  CLINICAL DATA:  Abnormal white blood cell count. EXAM: CHEST  2 VIEW COMPARISON:  None. FINDINGS: The heart size and mediastinal contours are within normal limits. Both lungs are clear. No pneumothorax or pleural effusion is noted. The visualized skeletal structures are unremarkable. IMPRESSION: No active cardiopulmonary disease. Electronically Signed   By: Marijo Conception, M.D.   On: 02/10/2015 13:20     ASSESSMENT AND PLAN:  1. Abnormal myoview - symptom concerning for unstable angina 2.  ASCAD with cath showing 95% mid LAD, 30% mid LAD and 30% D1 and moderate diffuse 50% RCA stenosis s/p PCI of the mid LAD with DES now on DAPT/statin/BB.  Get outpt echo to evaluate LVF. 3.  Lymphocytosis with peripheral smear worrisome for lymphoproliferative disease.  Will ask Hematology to evaluate prior to discharge home.    Patient stable from cardiac standpoint for discharge.  Will get lymphocytosis evaluated by  hematology today and if outpt workup recommended then will discharge home.    Sueanne Margarita, MD  02/11/2015  8:08 AM

## 2015-02-11 NOTE — Consult Note (Signed)
[facility] CONSULT NOTE  Patient Care Team: [name], MD as PCP - General (Internal Medicine)  CHIEF COMPLAINTS/PURPOSE OF CONSULTATION:  Newly diagnosed lymphocytosis  HISTORY OF PRESENTING ILLNESS:  Jake Kennedy 65 y.o. male is here because of recent diagnosis of coronary artery disease who underwent a stent placement and incidentally was found to have elevated white blood cell count primarily elevated lymphocyte count. He was never informed previously that he had an elevated white blood cell count. He tried to find previous blood work but none of them had CBC done previously. His father-in-law had CLL and so they are very familiar with the diagnosis. He does not have any fevers chills night sweats or weight loss. Is otherwise healthy and fit prior to this infant with chest pain that led to a workup which revealed coronary artery disease.  MEDICAL HISTORY:  Past Medical History  Diagnosis Date  . BPH (benign prostatic hypertrophy)   . ED (erectile dysfunction)   . Coronary artery disease   . Basal cell carcinoma     moles removed in 2007 and 2012 from L nose and L hip  . CAD (coronary artery disease), native coronary artery 02/11/2015  . Lymphocytosis 02/11/2015    SURGICAL HISTORY: Past Surgical History  Procedure Laterality Date  . Laminectomy    . Coronary stent placement  02/10/2015    DES to LAD  . Cardiac catheterization  02/10/2015  . Tonsillectomy    . Cardiac catheterization N/A 02/10/2015    Procedure: Left Heart Cath and Coronary Angiography;  Surgeon: Sherren Mocha, MD;  Location: [facility];  Service: Cardiovascular;  Laterality: N/A;  . Cardiac catheterization N/A 02/10/2015    Procedure: Coronary Stent Intervention;  Surgeon: Sherren Mocha, MD;  Location: [facility];  Service: Cardiovascular;  Laterality: N/A;    SOCIAL HISTORY: Social History   Social History  . Marital Status: Married    Spouse Name: N/A  . Number of Children:  N/A  . Years of Education: N/A   Occupational History  . Not on file.   Social History Main Topics  . Smoking status: Never Smoker   . Smokeless tobacco: Never Used  . Alcohol Use: 0.0 oz/week    0 Standard drinks or equivalent per week     Comment: occasional  . Drug Use: No  . Sexual Activity: Not on file   Other Topics Concern  . Not on file   Social History Narrative    FAMILY HISTORY: Family History  Problem Relation Age of Onset  . Diabetes Mother   . Heart failure Mother   . Heart attack Mother     occured in 72s.   . Healthy Sister   . Healthy Brother   . Healthy Maternal Grandmother   . Healthy Maternal Grandfather   . Healthy Paternal Grandmother   . Healthy Paternal Grandfather   . Healthy Brother     ALLERGIES:  is allergic to latex.  MEDICATIONS:  Current Facility-Administered Medications  Medication Dose Route Frequency Provider Last Rate Last Dose  . 0.9 %  sodium chloride infusion  250 mL Intravenous PRN Sherren Mocha, MD      . acetaminophen (TYLENOL) tablet 650 mg  650 mg Oral Q4H PRN Sherren Mocha, MD      . aspirin chewable tablet 81 mg  81 mg Oral Daily Sherren Mocha, MD   81 mg at 02/11/15 [id number]  . atorvastatin (LIPITOR) tablet 80 mg  80 mg Oral q1800  Sherren Mocha, MD   80 mg at 02/10/15 1744  . clopidogrel (PLAVIX) tablet 75 mg  75 mg Oral Q breakfast Sherren Mocha, MD   75 mg at 02/11/15 0900  . metoprolol tartrate (LOPRESSOR) tablet 12.5 mg  12.5 mg Oral BID Sherren Mocha, MD   12.5 mg at 02/11/15 0907  . morphine 2 MG/ML injection 2 mg  2 mg Intravenous Q2H PRN Sherren Mocha, MD      . ondansetron [facility]) injection 4 mg  4 mg Intravenous Q6H PRN Sherren Mocha, MD      . oxyCODONE-acetaminophen (PERCOCET/ROXICET) 5-325 MG per tablet 1-2 tablet  1-2 tablet Oral Q4H PRN Sherren Mocha, MD      . sodium chloride 0.9 % injection 3 mL  3 mL Intravenous Q12H Sherren Mocha, MD   3 mL at 02/10/15 1745  . sodium chloride 0.9 % injection 3  mL  3 mL Intravenous PRN Sherren Mocha, MD        REVIEW OF SYSTEMS:   Constitutional: Denies fevers, chills or abnormal night sweats Eyes: Denies blurriness of vision, double vision or watery eyes Ears, nose, mouth, throat, and face: Denies mucositis or sore throat Respiratory: Denies cough, dyspnea or wheezes Cardiovascular: Denies palpitation, chest discomfort or lower extremity swelling Gastrointestinal:  Denies nausea, heartburn or change in bowel habits Skin: Denies abnormal skin rashes Lymphatics: Denies new lymphadenopathy or easy bruising Neurological:Denies numbness, tingling or new weaknesses Behavioral/Psych: Mood is stable, no new changes  All other systems were reviewed with the patient and are negative.  PHYSICAL EXAMINATION: ECOG PERFORMANCE STATUS: 1 - Symptomatic but completely ambulatory  Filed Vitals:   02/11/15 0720 02/11/15 0837  BP:  138/84  Pulse: 55 71  Temp:  98.1 F (36.7 C)  Resp:  18   Filed Weights   02/10/15 1004 02/11/15 0300  Weight: 182 lb (82.555 kg) 172 lb 9.9 oz (78.3 kg)    GENERAL:alert, no distress and comfortable SKIN: skin color, texture, turgor are normal, no rashes or significant lesions EYES: normal, conjunctiva are pink and non-injected, sclera clear OROPHARYNX:no exudate, no erythema and lips, buccal mucosa, and tongue normal  NECK: supple, thyroid normal size, non-tender, without nodularity LYMPH:  no palpable lymphadenopathy in the cervical, axillary or inguinal LUNGS: clear to auscultation and percussion with normal breathing effort HEART: regular rate & rhythm and no murmurs and no lower extremity edema ABDOMEN:abdomen soft, non-tender and normal bowel sounds Musculoskeletal:no cyanosis of digits and no clubbing  PSYCH: alert & oriented x 3 with fluent speech NEURO: no focal motor/sensory deficits  LABORATORY DATA:  I have reviewed the data as listed Lab Results  Component Value Date   WBC 17.0* 02/11/2015   HGB  14.2 02/11/2015   HCT 43.9 02/11/2015   MCV 93.4 02/11/2015   PLT 179 02/11/2015   Lab Results  Component Value Date   NA 141 02/11/2015   K 4.4 02/11/2015   CL 107 02/11/2015   CO2 27 02/11/2015    ASSESSMENT AND PLAN:  1. Lymphocytosis: Most likely CLL. I discussed with him the pathophysiology and natural history of CLL. I discussed the different stages of CLL I discussed the prognosis and indications for treatment of CLL  Recommendation: 1. Obtain flow cytometry on peripheral blood 2. FISH panel for CLL Unfortunately the status cannot be performed as an inpatient. I will call him on Monday to have him come more to the cancer center to have these tests drawn. I will follow with him in 2  weeks' time to discuss the results of this test and plan further follow-up and surveillance. Patient can be discharged home from hematology standpoint.   All questions were answered. The patient knows to call the clinic with any problems, questions or concerns.    Rulon Eisenmenger, MD @T @

## 2015-02-11 NOTE — Discharge Summary (Signed)
Discharge Summary   Patient ID: Jake Kennedy,  MRN: [id number], DOB/AGE: Jan 23, 1951 65 y.o.  Admit date: 02/10/2015 Discharge date: 02/11/2015  Primary Care Provider: Irven Kennedy Primary Cardiologist: Dr. Burt Kennedy  Discharge Diagnoses Principal Problem:   Exertional angina [facility]) Active Problems:   CAD (coronary artery disease), native coronary artery   Lymphocytosis   Abnormal WBC count   Abnormal stress test   Allergies Allergies  Allergen Reactions  . Latex Itching    Blisters and itching    Procedures  Cardiac catheterization 02/10/2015 Conclusion     Mid LAD-2 lesion, 95% stenosed. Post intervention, there is a 0% residual stenosis.  Mid LAD-1 lesion, 30% stenosed.  1st Diag lesion, 30% stenosed.  1. Severe LAD stenosis, treated successfully with PCI using a drug-eluting stent platform 2. Moderate diffuse RCA stenosis, recommend medical therapy 3. Patent left circumflex without significant stenosis  Recommend dual antiplatelet therapy with aspirin and Plavix for 12 months without interruption. The patient needs aggressive risk reduction. A high-dose statin drug will be initiated.       Hospital Course  Jake Kennedy is a pleasant 65 year old male with no significant PMH who was seen in the office on 02/02/2015 after his treadmill nuclear stress test on the same day was severely abnormal. According to the patient, he started having exertional symptom around December 1st while running on the beach. During the stress test, he did have chest discomfort on the treadmill, which will later went away after rest and EKG portion showed T-wave inversion in V1 and V2 and a mild ST elevation during stress and T-wave inversion in aVL. Image portion showed some decreased perfusion in inferior and apex with stress test. After discussing various options with the patient, he agreed to undergo cardiac catheterization.  Precath workup showed significantly elevated white blood cell  count of 18 despite him not exhibiting any sign of infection. Pathology review showed absolute lymphocytosis and atypical lymphocytes suggestive of a lymphoproliferative process. he presented to [facility] on 02/10/2015 which noted 95 eccentric mid LAD stenosis treated with a 3.0 x 16 mm Promus DES, 30% mid LAD lesion, 30% D1 stenosis. Postprocedure, he was placed on aspirin and Plavix along with Lipitor and metoprolol. He was seen in the morning of 02/11/2015, his white blood cell count continued to be elevated despite negative urinalysis and chest x-ray. Hematology/oncology was consulted, he was seen by Dr. Lindi Kennedy who felt patient likely has CLL however does not warrant inpatient workup. He will need FISH panel for CLL and obtain flow cytometry. He denies any significant chest discomfort or shortness of breath. He is deemed stable for discharge from cardiology perspective. He will need to follow-up with hematology/oncology as outpatient. I have called his pharmacy which will remain open until 6 PM today despite the snow. Emphasis has been placed on compliance with DAPT. I have stopped his Levitra due to interaction with nitroglycerin. He will obtain an outpatient echocardiogram and a follow-up with Dr. Burt Kennedy or his APP in the office in 2-4 weeks.    Discharge Vitals Blood pressure 138/84, pulse 71, temperature 98.1 F (36.7 C), temperature source Oral, resp. rate 18, height 6\' 2"  (1.88 m), weight 172 lb 9.9 oz (78.3 kg), SpO2 96 %.  Filed Weights   02/10/15 1004 02/11/15 0300  Weight: 182 lb (82.555 kg) 172 lb 9.9 oz (78.3 kg)    Labs  CBC  Recent Labs  02/10/15 1125 02/11/15 0305  WBC 17.6* 17.0*  NEUTROABS 4.0  --  HGB 15.9 14.2  HCT 48.9 43.9  MCV 94.0 93.4  PLT 196 0000000   Basic Metabolic Panel  Recent Labs  02/11/15 0305  NA 141  K 4.4  CL 107  CO2 27  GLUCOSE 102*  BUN 14  CREATININE 0.84  CALCIUM 8.7*    Disposition  Pt is being discharged home today in good  condition.  Follow-up Plans & Appointments      Follow-up Information    Follow up with Jake Mocha, MD.   Specialty:  Cardiology   Why:  Office scheduler will contact you to arrange a followup and Echocardiogram, please give [place] a call if you do not hear from [place] in 2 business days   Contact information:   [address] [address] [phone]       Follow up with Jake Eisenmenger, MD.   Specialty:  Hematology and Oncology   Why:  Please followup with hematology as outpatient.   Contact information:   [place] [address] [phone]       Discharge Medications    Medication List    STOP taking these medications        FLUARIX QUADRIVALENT 0.5 ML injection  Generic drug:  Influenza vac split quadrivalent PF     LEVITRA 20 MG tablet  Generic drug:  vardenafil     NEXIUM 24HR PO      TAKE these medications        aspirin 81 MG tablet  Take 81 mg by mouth daily.  Notes to Patient:  Prevents clotting in stent and heart attack     atorvastatin 80 MG tablet  Commonly known as:  LIPITOR  Take 1 tablet (80 mg total) by mouth daily at 6 PM.  Notes to Patient:  Cholesterol       *NEW*     beta carotene w/minerals tablet  Take 1 tablet by mouth daily.  Notes to Patient:  Supplement      clopidogrel 75 MG tablet  Commonly known as:  PLAVIX  Take 1 tablet (75 mg total) by mouth daily with breakfast.  Notes to Patient:  Prevents clotting in stent and heart attack      *NEW*     FISH OIL CONCENTRATE PO  Take 1 capsule by mouth daily.  Notes to Patient:  Cholesterol      metoprolol tartrate 25 MG tablet  Commonly known as:  LOPRESSOR  Take 0.5 tablets (12.5 mg total) by mouth 2 (two) times daily.  Notes to Patient:  Decreases work of the heart, heart rate and blood pressure     multivitamin tablet  Take 1 tablet by mouth daily.  Notes to Patient:  Supplement      nitroGLYCERIN 0.4 MG SL tablet  Commonly  known as:  NITROSTAT  Place 1 tablet (0.4 mg total) under the tongue every 5 (five) minutes as needed for chest pain.  Notes to Patient:  Emergency chest pain        Outstanding Labs/Studies  Outpatient Echocardiogram  Duration of Discharge Encounter   Greater than 30 minutes including physician time.  Hilbert Corrigan PA-C Pager: [phone] 02/11/2015, 12:29 PM

## 2015-02-11 NOTE — Discharge Instructions (Signed)
No driving for 24 hours. No lifting over 5 lbs for 1 week. No sexual activity for 1 week. Keep procedure site clean & dry. If you notice increased pain, swelling, bleeding or pus, call/return!  You may shower, but no soaking baths/hot tubs/pools for 1 week.    Do Not take Levitra within 48 hours of taking nitroglycerin as combination of the 2 can significantly drop blood pressure.

## 2015-02-11 NOTE — Progress Notes (Signed)
CARDIAC REHAB PHASE I   PRE:  Rate/Rhythm: 64 sinus  BP:  Supine: 133/72      SaO2: 98 room air  MODE:  Ambulation: 600 ft   POST:  Rate/Rhythem: 74 sinus  BP:  Sitting: 138/84       Pt ambulated 600 ft without complaint and tolerated well.  VSS. Reviewed diet and exercise guidelines with pt and wife.  Discussed restrictions, stent book, NTG use, and when to call 911/MD.  Also reviewed CR Phase II with referral sent to [place].  Pt and wife voiced understanding. Alberteen Sam, MA, ACSM RCEP  Luan Pulling, Nita Sells

## 2015-02-13 ENCOUNTER — Other Ambulatory Visit: Payer: Self-pay | Admitting: Hematology and Oncology

## 2015-02-13 ENCOUNTER — Other Ambulatory Visit (HOSPITAL_COMMUNITY)
Admission: RE | Admit: 2015-02-13 | Discharge: 2015-02-13 | Disposition: A | Discharge status: Home / Self Care | Payer: BLUE CROSS/BLUE SHIELD | Source: Ambulatory Visit | Attending: Hematology and Oncology | Admitting: Hematology and Oncology

## 2015-02-13 ENCOUNTER — Other Ambulatory Visit (HOSPITAL_BASED_OUTPATIENT_CLINIC_OR_DEPARTMENT_OTHER): Payer: BLUE CROSS/BLUE SHIELD

## 2015-02-13 DIAGNOSIS — D72820 Lymphocytosis (symptomatic): Secondary | ICD-10-CM | POA: Insufficient documentation

## 2015-02-15 LAB — FLOW CYTOMETRY

## 2015-02-16 ENCOUNTER — Telehealth: Payer: Self-pay | Admitting: Cardiovascular Disease

## 2015-02-16 NOTE — Telephone Encounter (Signed)
New Message   Pt calling to speak w/ RN- c/o of bruising and discolored skin near but NOT directly on wound site from recent CATH- same arm- Please call back and discuss.

## 2015-02-16 NOTE — Telephone Encounter (Signed)
I spoke with the pt and he complains of bruising on his right forearm.  The bruise is dark purple.  The pt denies any pain, redness, warmth, swelling, numbness or tingling in extremity. Puncture site is healing with no drainage. The pt will continue with observation at this time and contact the office with any other questions or concerns.

## 2015-02-20 ENCOUNTER — Telehealth: Payer: Self-pay | Admitting: *Deleted

## 2015-02-20 ENCOUNTER — Ambulatory Visit (HOSPITAL_COMMUNITY)
Admission: RE | Admit: 2015-02-20 | Discharge: 2015-02-20 | Disposition: A | Discharge status: Home / Self Care | Payer: BLUE CROSS/BLUE SHIELD | Source: Ambulatory Visit | Attending: Cardiology | Admitting: Cardiology

## 2015-02-20 ENCOUNTER — Telehealth: Payer: Self-pay | Admitting: Cardiovascular Disease

## 2015-02-20 DIAGNOSIS — M79601 Pain in right arm: Secondary | ICD-10-CM | POA: Insufficient documentation

## 2015-02-20 NOTE — Telephone Encounter (Signed)
Cath done 02/10/15 radial approach. See other phone note// pt continues with arm pain down right arm that wakes him up at night. Pt states he is black and blue from wrist to elbow. Denies swelling, warmth or redness. States the numb pain wakes him up at night. Reviewed with Truitt Merle NP and orders followed for an arm doppler. Orders place and patient was updated.

## 2015-02-20 NOTE — Telephone Encounter (Signed)
New Message   Pt calling to speak w/ RN- pt c/o of surgical site/whole arm is becoming more painful since speaking w/ RN last week- especially at night. Please call back and discuss.

## 2015-02-20 NOTE — Telephone Encounter (Signed)
FISH results received from [facility], reviewed by Dr. Lindi Adie, sent to scan.

## 2015-02-22 ENCOUNTER — Encounter: Payer: Self-pay | Admitting: Hematology and Oncology

## 2015-02-27 ENCOUNTER — Other Ambulatory Visit: Payer: Self-pay | Admitting: *Deleted

## 2015-02-27 DIAGNOSIS — D72820 Lymphocytosis (symptomatic): Secondary | ICD-10-CM

## 2015-02-28 ENCOUNTER — Ambulatory Visit (HOSPITAL_COMMUNITY): Payer: BLUE CROSS/BLUE SHIELD | Attending: Cardiovascular Disease

## 2015-02-28 ENCOUNTER — Other Ambulatory Visit: Payer: Self-pay

## 2015-02-28 ENCOUNTER — Telehealth: Payer: Self-pay | Admitting: Hematology and Oncology

## 2015-02-28 ENCOUNTER — Other Ambulatory Visit (HOSPITAL_BASED_OUTPATIENT_CLINIC_OR_DEPARTMENT_OTHER): Payer: BLUE CROSS/BLUE SHIELD

## 2015-02-28 ENCOUNTER — Encounter: Payer: Self-pay | Admitting: Hematology and Oncology

## 2015-02-28 ENCOUNTER — Ambulatory Visit (HOSPITAL_BASED_OUTPATIENT_CLINIC_OR_DEPARTMENT_OTHER): Payer: BLUE CROSS/BLUE SHIELD | Admitting: Hematology and Oncology

## 2015-02-28 VITALS — BP 130/84 | HR 61 | Temp 97.9°F | Resp 18 | Ht 74.0 in | Wt 179.1 lb

## 2015-02-28 DIAGNOSIS — I517 Cardiomegaly: Secondary | ICD-10-CM | POA: Diagnosis not present

## 2015-02-28 DIAGNOSIS — I071 Rheumatic tricuspid insufficiency: Secondary | ICD-10-CM | POA: Diagnosis not present

## 2015-02-28 DIAGNOSIS — I340 Nonrheumatic mitral (valve) insufficiency: Secondary | ICD-10-CM | POA: Diagnosis not present

## 2015-02-28 DIAGNOSIS — D72820 Lymphocytosis (symptomatic): Secondary | ICD-10-CM

## 2015-02-28 DIAGNOSIS — C9110 Chronic lymphocytic leukemia of B-cell type not having achieved remission: Secondary | ICD-10-CM

## 2015-02-28 DIAGNOSIS — I2510 Atherosclerotic heart disease of native coronary artery without angina pectoris: Secondary | ICD-10-CM | POA: Diagnosis not present

## 2015-02-28 DIAGNOSIS — Z8249 Family history of ischemic heart disease and other diseases of the circulatory system: Secondary | ICD-10-CM | POA: Insufficient documentation

## 2015-02-28 LAB — CBC WITH DIFFERENTIAL/PLATELET
BASO%: 0.3 % (ref 0.0–2.0)
Basophils Absolute: 0 10*3/uL (ref 0.0–0.1)
EOS ABS: 0.2 10*3/uL (ref 0.0–0.5)
EOS%: 1.9 % (ref 0.0–7.0)
HEMATOCRIT: 44.9 % (ref 38.4–49.9)
HEMOGLOBIN: 15 g/dL (ref 13.0–17.1)
LYMPH#: 7.3 10*3/uL — AB (ref 0.9–3.3)
LYMPH%: 62 % — ABNORMAL HIGH (ref 14.0–49.0)
MCH: 31.1 pg (ref 27.2–33.4)
MCHC: 33.4 g/dL (ref 32.0–36.0)
MCV: 93 fL (ref 79.3–98.0)
MONO#: 0.9 10*3/uL (ref 0.1–0.9)
MONO%: 7.6 % (ref 0.0–14.0)
NEUT%: 28.2 % — ABNORMAL LOW (ref 39.0–75.0)
NEUTROS ABS: 3.3 10*3/uL (ref 1.5–6.5)
PLATELETS: 157 10*3/uL (ref 140–400)
RBC: 4.83 10*6/uL (ref 4.20–5.82)
RDW: 13.3 % (ref 11.0–14.6)
WBC: 11.8 10*3/uL — AB (ref 4.0–10.3)

## 2015-02-28 LAB — COMPREHENSIVE METABOLIC PANEL
ALBUMIN: 4.1 g/dL (ref 3.5–5.0)
ALK PHOS: 62 U/L (ref 40–150)
ALT: 27 U/L (ref 0–55)
ANION GAP: 7 meq/L (ref 3–11)
AST: 19 U/L (ref 5–34)
BILIRUBIN TOTAL: 0.66 mg/dL (ref 0.20–1.20)
BUN: 16.1 mg/dL (ref 7.0–26.0)
CO2: 29 mEq/L (ref 22–29)
CREATININE: 1 mg/dL (ref 0.7–1.3)
Calcium: 8.9 mg/dL (ref 8.4–10.4)
Chloride: 105 mEq/L (ref 98–109)
EGFR: 83 mL/min/{1.73_m2} — AB (ref >90)
Glucose: 97 mg/dl (ref 70–140)
Potassium: 4.5 mEq/L (ref 3.5–5.1)
Sodium: 141 mEq/L (ref 136–145)
TOTAL PROTEIN: 6.6 g/dL (ref 6.4–8.3)

## 2015-02-28 LAB — TECHNOLOGIST REVIEW

## 2015-02-28 NOTE — Assessment & Plan Note (Addendum)
CLL stage 0: Flow cytometry revealed a monoclonal B-cell population, lambda restricted expressing pan B cell antigens including CD20 with associated CD5 expression. Differential diagnosis CLL versus Mantle cell lymphoma FISH panel for CLL prognostic markers: trisomy 12 ( intermediate prognosis), no evidence of chromosome 13 mutation, p53, ATM deletions   Recommendation: Translocation 11:14 will be requested when he comes back to see [place] in 6 months to rule out mantle cell lymphoma. Since his disease course is extremely indolent, I did not ask him to get additional blood work today.  Previously I discussed with him about pathophysiology, staging, prognosis and indications for treatment of CLL.  Treatment plan: Observation  Return to clinic in 6 months with lab work and follow-up and after that we will see him once a year

## 2015-02-28 NOTE — Progress Notes (Signed)
Unable to get in to room prior to MD.  No assessment performed.

## 2015-02-28 NOTE — Assessment & Plan Note (Deleted)
Lymphocytosis: Flow cytometry revealed a monoclonal B-cell population, lambda restricted expressing pan B cell antigens including CD20 with associated CD5 expression. Differential diagnosis CLL versus Mantle cell lymphoma FISH panel for CLL prognostic markers is pending  Recommendation: Translocation 11:14 will be requested to rule out mantle cell lymphoma. I strongly suspect that the patient has CLL. Previously I discussed with him about pathophysiology, staging, prognosis and indications for treatment of CLL.  Treatment plan: Observation  Return to clinic in 3 months with lab work and follow-up

## 2015-02-28 NOTE — Telephone Encounter (Signed)
Appointments made and avs printed °

## 2015-02-28 NOTE — Progress Notes (Signed)
Patient Care Team: [name], MD as PCP - General (Internal Medicine)  DIAGNOSIS: CLL stage 0  SUMMARY OF ONCOLOGIC HISTORY:   Chronic lymphocytic leukemia (CLL), B-cell ([place])   02/14/2015 Initial Diagnosis Monoclonal B-cell population CD5 and CD20 positive, differential diagnosis CLL versus mantle cell lymphoma ( patient had absolute lymphocyte count of 8000 in 2010), remained stable.    CHIEF COMPLIANT: follow-up to discuss blood results  INTERVAL HISTORY: Jake Kennedy is a 65 year old with above-mentioned history of elevated lymphocyte count when seen in the hospital (admitted for coronary artery disease) and he had extensive blood work performed which included flow cytometry and FISH panel for CLL. He is here today to discuss the results. He does not have any problems with chest pain or shortness of breath.  REVIEW OF SYSTEMS:   Constitutional: Denies fevers, chills or abnormal weight loss Eyes: Denies blurriness of vision Ears, nose, mouth, throat, and face: Denies mucositis or sore throat Respiratory: Denies cough, dyspnea or wheezes Cardiovascular: Denies palpitation, chest discomfort Gastrointestinal:  Denies nausea, heartburn or change in bowel habits Skin: Denies abnormal skin rashes Lymphatics: Denies new lymphadenopathy or easy bruising Neurological:Denies numbness, tingling or new weaknesses Behavioral/Psych: Mood is stable, no new changes  Extremities: No lower extremity edema All other systems were reviewed with the patient and are negative.  I have reviewed the past medical history, past surgical history, social history and family history with the patient and they are unchanged from previous note.  ALLERGIES:  is allergic to latex.  MEDICATIONS:  Current Outpatient Prescriptions  Medication Sig Dispense Refill  . aspirin 81 MG tablet Take 81 mg by mouth daily.    Marland Kitchen atorvastatin (LIPITOR) 80 MG tablet Take 1 tablet (80 mg total) by mouth daily at 6 PM. 90  tablet 3  . beta carotene w/minerals (OCUVITE) tablet Take 1 tablet by mouth daily.    . clopidogrel (PLAVIX) 75 MG tablet Take 1 tablet (75 mg total) by mouth daily with breakfast. 90 tablet 3  . metoprolol tartrate (LOPRESSOR) 25 MG tablet Take 0.5 tablets (12.5 mg total) by mouth 2 (two) times daily. 90 tablet 3  . Multiple Vitamin (MULTIVITAMIN) tablet Take 1 tablet by mouth daily.    . nitroGLYCERIN (NITROSTAT) 0.4 MG SL tablet Place 1 tablet (0.4 mg total) under the tongue every 5 (five) minutes as needed for chest pain. 25 tablet 3  . Omega-3 Fatty Acids (FISH OIL CONCENTRATE PO) Take 1 capsule by mouth daily.      No current facility-administered medications for this visit.    PHYSICAL EXAMINATION: ECOG PERFORMANCE STATUS: 0 - Asymptomatic  Filed Vitals:   02/28/15 1456  BP: 130/84  Pulse: 61  Temp: 97.9 F (36.6 C)  Resp: 18   Filed Weights   02/28/15 1456  Weight: 179 lb 1.6 oz (81.239 kg)    GENERAL:alert, no distress and comfortable SKIN: skin color, texture, turgor are normal, no rashes or significant lesions EYES: normal, Conjunctiva are pink and non-injected, sclera clear OROPHARYNX:no exudate, no erythema and lips, buccal mucosa, and tongue normal  NECK: supple, thyroid normal size, non-tender, without nodularity LYMPH:  no palpable lymphadenopathy in the cervical, axillary or inguinal LUNGS: clear to auscultation and percussion with normal breathing effort HEART: regular rate & rhythm and no murmurs and no lower extremity edema ABDOMEN:abdomen soft, non-tender and normal bowel sounds MUSCULOSKELETAL:no cyanosis of digits and no clubbing  NEURO: alert & oriented x 3 with fluent speech, no focal motor/sensory deficits EXTREMITIES: No lower  extremity edema LABORATORY DATA:  I have reviewed the data as listed   Chemistry      Component Value Date/Time   NA 141 02/28/2015 1444   NA 141 02/11/2015 0305   K 4.5 02/28/2015 1444   K 4.4 02/11/2015 0305   CL 107  02/11/2015 0305   CO2 29 02/28/2015 1444   CO2 27 02/11/2015 0305   BUN 16.1 02/28/2015 1444   BUN 14 02/11/2015 0305   CREATININE 1.0 02/28/2015 1444   CREATININE 0.84 02/11/2015 0305   CREATININE 0.89 02/07/2015 1056      Component Value Date/Time   CALCIUM 8.9 02/28/2015 1444   CALCIUM 8.7* 02/11/2015 0305   ALKPHOS 62 02/28/2015 1444   AST 19 02/28/2015 1444   ALT 27 02/28/2015 1444   BILITOT 0.66 02/28/2015 1444       Lab Results  Component Value Date   WBC 11.8* 02/28/2015   HGB 15.0 02/28/2015   HCT 44.9 02/28/2015   MCV 93.0 02/28/2015   PLT 157 02/28/2015   NEUTROABS 3.3 02/28/2015   ASSESSMENT & PLAN:  Chronic lymphocytic leukemia (CLL), B-cell ([facility]) CLL stage 0: Flow cytometry revealed a monoclonal B-cell population, lambda restricted expressing pan B cell antigens including CD20 with associated CD5 expression. Differential diagnosis CLL versus Mantle cell lymphoma FISH panel for CLL prognostic markers: trisomy 12 ( intermediate prognosis), no evidence of chromosome 13 mutation, p53, ATM deletions   Recommendation: Translocation 11:14 will be requested when he comes back to see [place] in 6 months to rule out mantle cell lymphoma. Since his disease course is extremely indolent, I did not ask him to get additional blood work today.  Previously I discussed with him about pathophysiology, staging, prognosis and indications for treatment of CLL.  Treatment plan: Observation  Return to clinic in 6 months with lab work and follow-up and after that we will see him once a year   Orders Placed This Encounter  Procedures  . CBC with Differential    Standing Status: Future     Number of Occurrences:      Standing Expiration Date: 02/28/2016  . FISH, Peripheral Blood    11:14 Translocation    Standing Status: Future     Number of Occurrences:      Standing Expiration Date: 02/28/2016   The patient has a good understanding of the overall plan. he agrees with it. he will  call with any problems that may develop before the next visit here.   Rulon Eisenmenger, MD 02/28/2015

## 2015-03-03 LAB — FISH,CLL PROGNOSTIC PANEL

## 2015-03-06 ENCOUNTER — Ambulatory Visit (INDEPENDENT_AMBULATORY_CARE_PROVIDER_SITE_OTHER): Payer: BLUE CROSS/BLUE SHIELD | Admitting: Sports Medicine

## 2015-03-06 ENCOUNTER — Encounter: Payer: Self-pay | Admitting: Sports Medicine

## 2015-03-06 VITALS — BP 114/75 | HR 65 | Ht 74.0 in | Wt 182.0 lb

## 2015-03-06 DIAGNOSIS — M7701 Medial epicondylitis, right elbow: Secondary | ICD-10-CM

## 2015-03-06 NOTE — Patient Instructions (Signed)

## 2015-03-06 NOTE — Progress Notes (Signed)
   Subjective:    Patient ID: Jake Kennedy, male    DOB: [birth year], 65 y.o.   MRN: [id number]  HPI chief complaint: Right elbow pain  Very pleasant 65 year old male comes in today complaining of medial sided right elbow pain that has been present since the summer of 2016. He denies any specific injury but rather describes a gradual onset of pain and swelling that he localizes to the medial elbow. He decided to take a period of rest away from both golf as well as from exercise and as a result his symptoms did improve but never completely resolved. In December of last year, he saw Dr. Berenice Primas and was given a cortisone injection which did not help. X-rays at that time were unremarkable per the patient's report. His symptoms are intermittent and present primarily with any sort of activity that requires him to flex his right wrist. He denies numbness and tingling. No prior elbow surgeries. No pain at night. He has been trying some home exercises that he found online and he thinks they have been somewhat helpful.  Past medical history reviewed. It is significant for a cardiac stent which was placed on January 6 Medications reviewed Allergies reviewed    Review of Systems    as above Objective:   Physical Exam  Well-developed, well-nourished. No acute distress. Awake alert and oriented 3. Vital signs reviewed  Right elbow: Full range of motion. No effusion. No soft tissue swelling. There is tenderness to palpation directly over the medial epicondyle with reproducible pain with resisted wrist flexion and ulnar deviation. Negative Tinel's at the cubital tunnel. No tenderness over the lateral epicondyle. Good grip strength. Good radial and ulnar pulses.  MSK ultrasound of the right elbow was performed. Limited images of the medial elbow were obtained. There is an area of calcification in the common flexor tendon just distal to the medial epicondyle. This calcification has several hypoechoic areas  surrounding it. Findings are consistent with chronic medial epicondylitis with interstitial tearing.       Assessment & Plan:  Chronic right elbow pain secondary to medial epicondylitis  Although the patient has failed conservative treatment to date, including home exercises and a cortisone injection, I think we need to continue with conservative treatment for the time being. I would like to send him for formal physical [facility] I would also like to try a topical nitroglycerin protocol. I do not think there is a contraindication with his recent cardiac procedure but he will double check with his cardiologist tomorrow and let me know. As long as the cardiologist does not have any objections, we will call in a prescription when I hear back from the patient. Patient will follow-up with me in 4 weeks for reevaluation to include a repeat ultrasound.

## 2015-03-06 NOTE — Progress Notes (Signed)
Cardiology Office Note   Date:  03/07/2015   ID:  Jake Kennedy, DOB [birth year], MRN [id number]  PCP:  Irven Shelling, MD  Cardiologist:  Dr. Sherren Mocha  Chief Complaint  Patient presents with  . Follow-up    seen for Dr. Burt Knack, CAD      History of Present Illness: Leven Droessler is a 65 y.o. male who presents for office followup. He has a PMH of CAD s/p DES to mid LAD 02/10/2015 and lymphocytosis undergoing workup for possible CLL vs Mantle cell lymphoma. Cardiology originally saw the patient on 02/02/2015 after notified by nuc med staff that [facility] referred by PCP was severely abnormal. After discussing with the patient, it seem he was having exertional symptom since at least Dec 1st 2016. He agreed to undergo cardiac catheterization with possible PCI. Preprocedural labs shows abnormal leukocytosis. He underwent cardiac cath on 02/10/2015 which showed 95% mid LAD, 30% mid LAD and 30% D1 and moderate diffuse 50% RCA stenosis s/p PCI of the mid LAD with DES. He was placed on DAPT, statin and BB. Prior to discharge, hematology/oncology Dr. Lindi Adie was consulted regarding abnormal leukocytosis who felt patient might have either CLL or Mantle cell lymphoma. Flow cytometry revealed monoclonal B-cell population, with differential diagnosis including CLL vs Mantle cell lymphoma. Due to indolent nature of his disease, 6 month followup was recommended. Post cath he did have significant bruising of R elbow, arterial and venous doppler obtained on 03/02/2015 was negative for acute process. Outpatient echo obtained on 02/28/2015 showed EF 60-65%, no RWMA. Due to R elbow pain, he also saw sports medicine Dr. Micheline Chapman who recommended topical nitroglycerin protocol since he failed previous cortisone injection.   He presents today for cardiology followup. He has been doing well ambulating. He denies any recent chest pain or SOB. He has occasional dizziness and fatigue after taking metoprolol.     Past  Medical History  Diagnosis Date  . BPH (benign prostatic hypertrophy)   . ED (erectile dysfunction)   . Coronary artery disease   . Basal cell carcinoma     moles removed in 2007 and 2012 from L nose and L hip  . CAD (coronary artery disease), native coronary artery 02/11/2015    cath 02/10/2015 95 eccentric mid LAD stenosis treated with a 3.0 x 16 mm Promus DES, 30% mid LAD lesion, 30% D1 stenosis  . Lymphocytosis 02/11/2015    seen by Dr. Lindi Adie 02/11/2015, working up for possible CLL    Past Surgical History  Procedure Laterality Date  . Laminectomy    . Coronary stent placement  02/10/2015    DES to LAD  . Cardiac catheterization  02/10/2015  . Tonsillectomy    . Cardiac catheterization N/A 02/10/2015    Procedure: Left Heart Cath and Coronary Angiography;  Surgeon: Sherren Mocha, MD;  Location: [facility];  Service: Cardiovascular;  Laterality: N/A;  . Cardiac catheterization N/A 02/10/2015    Procedure: Coronary Stent Intervention;  Surgeon: Sherren Mocha, MD;  Location: [place] [facility];  Service: Cardiovascular;  Laterality: N/A;     Current Outpatient Prescriptions  Medication Sig Dispense Refill  . aspirin 81 MG tablet Take 81 mg by mouth daily.    Marland Kitchen atorvastatin (LIPITOR) 80 MG tablet Take 1 tablet (80 mg total) by mouth daily at 6 PM. 90 tablet 3  . beta carotene w/minerals (OCUVITE) tablet Take 1 tablet by mouth daily.    . clopidogrel (PLAVIX) 75 MG tablet Take 1 tablet (  75 mg total) by mouth daily with breakfast. 90 tablet 3  . metoprolol tartrate (LOPRESSOR) 25 MG tablet Take 0.5 tablets (12.5 mg total) by mouth 2 (two) times daily. 90 tablet 3  . Multiple Vitamin (MULTIVITAMIN) tablet Take 1 tablet by mouth daily.    . nitroGLYCERIN (NITROSTAT) 0.4 MG SL tablet Place 1 tablet (0.4 mg total) under the tongue every 5 (five) minutes as needed for chest pain. 25 tablet 3  . Omega-3 Fatty Acids (FISH OIL CONCENTRATE PO) Take 1 capsule by mouth daily.      No current  facility-administered medications for this visit.    Allergies:   Latex    Social History:  The patient  reports that he has never smoked. He has never used smokeless tobacco. He reports that he drinks alcohol. He reports that he does not use illicit drugs.   Family History:  The patient's family history includes Diabetes in his mother; Healthy in his brother, brother, maternal grandfather, maternal grandmother, paternal grandfather, paternal grandmother, and sister; Heart attack in his mother; Heart failure in his mother.    ROS:  Please see the history of present illness.   Otherwise, review of systems are positive for none.   All other systems are reviewed and negative.    PHYSICAL EXAM: VS:  BP 100/64 mmHg  Pulse 54  Ht 6\' 2"  (1.88 m)  Wt 179 lb 12.8 oz (81.557 kg)  BMI 23.08 kg/m2 , BMI Body mass index is 23.08 kg/(m^2). GEN: Well nourished, well developed, in no acute distress HEENT: normal Neck: no JVD, carotid bruits, or masses Cardiac: RRR; no murmurs, rubs, or gallops,no edema  Respiratory:  clear to auscultation bilaterally, normal work of breathing GI: soft, nontender, nondistended, + BS MS: no deformity or atrophy Skin: warm and dry, no rash Neuro:  Strength and sensation are intact Psych: euthymic mood, full affect   EKG:  EKG is ordered today. The ekg ordered today demonstrates sinus brady with PAC   Recent Labs: 02/28/2015: ALT 27; BUN 16.1; Creatinine 1.0; HGB 15.0; Platelets 157; Potassium 4.5; Sodium 141    Lipid Panel No results found for: CHOL, TRIG, HDL, CHOLHDL, VLDL, LDLCALC, LDLDIRECT    Wt Readings from Last 3 Encounters:  03/07/15 179 lb 12.8 oz (81.557 kg)  03/06/15 182 lb (82.555 kg)  02/28/15 179 lb 1.6 oz (81.239 kg)      Other studies Reviewed: Additional studies/ records that were reviewed today include:   Cardiac cath 02/10/2015 Conclusion     Mid LAD-2 lesion, 95% stenosed. Post intervention, there is a 0% residual  stenosis.  Mid LAD-1 lesion, 30% stenosed.  1st Diag lesion, 30% stenosed.  1. Severe LAD stenosis, treated successfully with PCI using a drug-eluting stent platform 2. Moderate diffuse RCA stenosis, recommend medical therapy 3. Patent left circumflex without significant stenosis  Recommend dual antiplatelet therapy with aspirin and Plavix for 12 months without interruption. The patient needs aggressive risk reduction. A high-dose statin drug will be initiated.     Echo 02/28/2015 LV EF: 60% -  65%  ------------------------------------------------------------------- Indications:   CAD (I25.10).  ------------------------------------------------------------------- History:  PMH: Basal Cell Carcinoma, Coronary Stent. Coronary artery disease. Risk factors: Family history of coronary artery disease.  ------------------------------------------------------------------- Study Conclusions  - Left ventricle: The cavity size was normal. There was mild focal basal hypertrophy of the septum. Systolic function was normal. The estimated ejection fraction was in the range of 60% to 65%. Wall motion was normal; there were no regional  wall motion abnormalities. Left ventricular diastolic function parameters were normal. - Aortic valve: Transvalvular velocity was within the normal range. There was no stenosis. There was no regurgitation. - Aorta: Ascending aortic diameter: 3.72 mm (S). - Mitral valve: Transvalvular velocity was within the normal range. There was no evidence for stenosis. There was trivial regurgitation. - Right ventricle: The cavity size was mildly dilated. Wall thickness was normal. Systolic function was normal. - Tricuspid valve: There was trivial regurgitation. - Pulmonic valve: Transvalvular velocity was within the normal range. There was no evidence for stenosis. There was mild regurgitation. - Inferior vena cava: The vessel was normal  in size. The respirophasic diameter changes were in the normal range (= 50%), consistent with normal central venous pressure.  Review of the above records demonstrates: Seen in Dec as new patient for exertional chest pain and abnormal myoview. Cathed on 02/10/2015 DES to mid LAD. Normal Echo post cath. Periprocedural labs shows abnormal leukocytosis. Now follows Dr. Lindi Adie for possible CLL vs Mantle cell lymphoma.     ASSESSMENT AND PLAN:  1.  CAD s/p DES to mid LAD on 02/10/2015. Echo obtained on 02/28/2015 shows normal EF and no significant valvular disease. Continue ASA and plavix for at least 1 year. Continue lipitor and metorpolol. Obtain fasting lipid panel, LFT and hgb A1C in 1 month. Followup with Dr. Burt Knack in 3 month. Refer for cardiac rehab. He also asked about his colonoscopy, I told him if his GI doctor is ok with doing it on ASA and plavix, I am ok with it, however if he needs to come off the ASA and plavix prior to GI procedure, I would prefer him to hold off this elective procedure until 1 year after being on DAPT given fresh stent. Although he mentioned some occasional dizziness and fatigue after taking metoprolol, his BP today 100/64, I still think BB is beneficial to him at this point. I told him to keep himself hydrated and keep BP diary with 1 recording at 2 hrs after taking morning med and another recording at [facility] and bring the diary to his next followup.    2. Leukocytosis with possible CLL vs Mantle cell lymphoma: see flow cytometry and FISH Panel test result. Followed by Dr. Lindi Adie. Per Dr. Lindi Adie, due to indolent nature of his disease, plan for 6 month followup and yearly after that. Last CBC shows WBC has improved from previous 18.7 down to 11.  3. R elbow pain: seeing sports medicine. No obvious contraindication with topical nitroglycerin therapy or rehab from cardiac perspective. I told him not to take any Viagra, Levitra and Cialis when taking nitroglycerin.       Current medicines are reviewed at length with the patient today.  The patient does not have concerns regarding medicines.  The following changes have been made:  no change  Labs/ tests ordered today include:   Orders Placed This Encounter  Procedures  . Lipid panel  . Hepatic function panel  . HgB A1c  . EKG 12-Lead     Disposition:   FU with Dr. Burt Knack in 3 months  Signed, Almyra Deforest, [place]  03/07/2015 8:52 AM    [address], [place], [place]  [address] Phone: [phone]; Fax: [phone]

## 2015-03-07 ENCOUNTER — Other Ambulatory Visit: Payer: Self-pay | Admitting: *Deleted

## 2015-03-07 ENCOUNTER — Ambulatory Visit (INDEPENDENT_AMBULATORY_CARE_PROVIDER_SITE_OTHER): Payer: BLUE CROSS/BLUE SHIELD | Admitting: Physician Assistant

## 2015-03-07 ENCOUNTER — Encounter: Payer: Self-pay | Admitting: Physician Assistant

## 2015-03-07 VITALS — BP 100/64 | HR 54 | Ht 74.0 in | Wt 179.8 lb

## 2015-03-07 DIAGNOSIS — C9111 Chronic lymphocytic leukemia of B-cell type in remission: Secondary | ICD-10-CM | POA: Diagnosis not present

## 2015-03-07 DIAGNOSIS — I2510 Atherosclerotic heart disease of native coronary artery without angina pectoris: Secondary | ICD-10-CM | POA: Diagnosis not present

## 2015-03-07 DIAGNOSIS — M25521 Pain in right elbow: Secondary | ICD-10-CM

## 2015-03-07 MED ORDER — NITROGLYCERIN 0.2 MG/HR TD PT24: MEDICATED_PATCH | TRANSDERMAL | Status: DC

## 2015-03-07 NOTE — Patient Instructions (Signed)
Medication Instructions:  Your physician recommends that you continue on your current medications as directed. Please refer to the Current Medication list given to you today.   Labwork: Your physician recommends that you return for a FASTING lipid profile, lft, hgA1c in 1 month   Testing/Procedures: None ordered  Follow-Up: Your physician recommends that you schedule a follow-up appointment in: 3 months with Dr.Cooper  You have been referred to [place]    Any Other Special Instructions Will Be Listed Below (If Applicable). Do not take Viagra, Cialis, or Levitra within 48hrs of using Nitro-glycerin     If you need a refill on your cardiac medications before your next appointment, please call your pharmacy.

## 2015-03-08 ENCOUNTER — Other Ambulatory Visit: Payer: Self-pay | Admitting: *Deleted

## 2015-03-08 DIAGNOSIS — M25521 Pain in right elbow: Secondary | ICD-10-CM

## 2015-03-09 ENCOUNTER — Telehealth (HOSPITAL_COMMUNITY): Payer: Self-pay | Admitting: *Deleted

## 2015-03-09 NOTE — Telephone Encounter (Signed)
Returned pt call from message left earlier today.  Message left on pt voicemail.  Received signed MD from Dr. Burt Knack. Message for pt to please contact cardiac rehab for sign up.  Contact information provided. Cherre Huger, BSN

## 2015-03-10 ENCOUNTER — Telehealth (HOSPITAL_COMMUNITY): Payer: Self-pay | Admitting: *Deleted

## 2015-03-10 NOTE — Telephone Encounter (Signed)
-----   Message from Thurman Coyer, DO sent at 03/10/2015 10:08 AM EST ----- Regarding: RE: Any activity restrictions at Cardiac rehab Only restriction would be if he has any elbow pain with using the hand weights. Otherwise, no restrictions.  ----- Message -----    From: Rowe Pavy, RN    Sent: 03/10/2015   9:42 AM      To: Thurman Coyer, DO Subject: Any activity restrictions at Cardiac rehab     Dr. Micheline Chapman,  Pt has been referred to the Cardiac Rehab program s/p coronary stent to LAD on 1/6. Pt will start exercise on 2/13.  Pt noted to have been seen by you for chronic medial epicondylitis with interstial tearing to his right elbow. Pt will walk on the treadmill, recumbent stepper, air[employer]dy[employer] bike and light hand weights.  Pt will participate on mondays wednesdays and fridays  Any restrictions to his Right arm, any exercises to avoid.?  Thank you for your input Maurice Small RN

## 2015-03-16 ENCOUNTER — Encounter (HOSPITAL_COMMUNITY)
Admission: RE | Admit: 2015-03-16 | Discharge: 2015-03-16 | Disposition: A | Discharge status: Home / Self Care | Payer: BLUE CROSS/BLUE SHIELD | Source: Ambulatory Visit | Attending: Cardiovascular Disease | Admitting: Cardiovascular Disease

## 2015-03-16 DIAGNOSIS — I2582 Chronic total occlusion of coronary artery: Secondary | ICD-10-CM | POA: Insufficient documentation

## 2015-03-16 DIAGNOSIS — Z955 Presence of coronary angioplasty implant and graft: Secondary | ICD-10-CM | POA: Insufficient documentation

## 2015-03-16 NOTE — Progress Notes (Signed)
Cardiac Rehab Medication Review by a Pharmacist  Does the patient  feel that his/her medications are working for him/her?  yes  Has the patient been experiencing any side effects to the medications prescribed?  no  Does the patient measure his/her own blood pressure or blood glucose at home?  yes   Does the patient have any problems obtaining medications due to transportation or finances?   no  Understanding of regimen: excellent Understanding of indications: excellent Potential of compliance: excellent    Pharmacist comments: Patient is new to medications. Does not want to be on medications but is very aware of why he is taking the medications he is on. He is serious about his medical management and being adherent to his providers plans for his therapy.   Melburn Popper, PharmD Clinical Pharmacy Resident Pager: [phone] 03/16/2015 8:31 AM

## 2015-03-20 ENCOUNTER — Encounter (HOSPITAL_COMMUNITY)
Admission: RE | Admit: 2015-03-20 | Discharge: 2015-03-20 | Disposition: A | Discharge status: Home / Self Care | Payer: BLUE CROSS/BLUE SHIELD | Source: Ambulatory Visit | Attending: Cardiovascular Disease | Admitting: Cardiovascular Disease

## 2015-03-20 DIAGNOSIS — I2582 Chronic total occlusion of coronary artery: Secondary | ICD-10-CM | POA: Diagnosis not present

## 2015-03-20 DIAGNOSIS — Z955 Presence of coronary angioplasty implant and graft: Secondary | ICD-10-CM | POA: Diagnosis not present

## 2015-03-20 NOTE — Progress Notes (Signed)
Pt started exercise today in the Phase II cardiac rehab program.  Pt tolerated light exercise without difficulty. VSS, telemetry-Sr with PAC's, asymptomatic.  Medication list reconciled.  Pt verbalized compliance with medications and denies barriers to compliance. PSYCHOSOCIAL ASSESSMENT:  PHQ-0. Pt exhibits positive coping skills, hopeful outlook with supportive family. Pt feels very positive about his outlook and is excited to be in the program.  Pt anticipates being in the program for a short period of time due to receiving the okay to resume actiivites by his cardiologist. No psychosocial needs identified at this time, no psychosocial interventions necessary.    Pt enjoys playing with grand kids, jogging and playing golf.   Pt cardiac rehab  goal is  to back to his exercise routine.  Pt desires to resume light jogging.  Pt encouraged to participate in home exercise and educational classes particular exercising on your own to increase ability to achieve these goals.   Pt long term cardiac rehab goal is be able to play golf. Will periodic check in with pt to monitor progress toward meeting this goal. Pt oriented to exercise equipment and routine.  Understanding verbalized. Cherre Huger, BSN

## 2015-03-22 ENCOUNTER — Encounter (HOSPITAL_COMMUNITY)
Admission: RE | Admit: 2015-03-22 | Discharge: 2015-03-22 | Disposition: A | Discharge status: Home / Self Care | Payer: BLUE CROSS/BLUE SHIELD | Source: Ambulatory Visit | Attending: Cardiovascular Disease | Admitting: Cardiovascular Disease

## 2015-03-22 DIAGNOSIS — Z955 Presence of coronary angioplasty implant and graft: Secondary | ICD-10-CM | POA: Diagnosis not present

## 2015-03-22 NOTE — Progress Notes (Signed)
QUALITY OF LIFE SCORE REVIEW  Pt completed Quality of Life survey as a participant in Cardiac Rehab. Scores 21.0 or below are considered low. Pt scored the following Overall 28.68, Health and Function 28.67, socioeconomic 27.80, physiological and spiritual 28.93, family 30.00.  Pt scored well above the low threshold.  Pt demonstrates positive and healthy outlook on life.  Pt with appropriate healthy coping skills.  Will continue to monitor and intervene as necessary.  Cherre Huger, BSN

## 2015-03-23 ENCOUNTER — Telehealth: Payer: Self-pay | Admitting: Cardiovascular Disease

## 2015-03-23 NOTE — Telephone Encounter (Signed)
Pt had a stent put in yesterday. Pt says he is not feeling right,just a little tightness in his chest and blood pressure is a little low. He wonder if he might need to be seen?

## 2015-03-23 NOTE — Telephone Encounter (Signed)
I spoke with the pt and he has been feeling well since his stent placement 02/10/15.  The pt ate dinner (whole wheat pasta with cajun red sauce and chicken) last night and for the first time the pt felt a tightness in his chest and he belched.  Belching did improve symptoms.  The tightness in his chest was not similar to the sharp pain in his chest he felt prior to stent. The pt does not have a history of reflux.  I advised the pt that his symptoms sound like indigestion and he can try OTC tums if he has similar symptoms in the future. The pt is also concerned about his BP running low. The pt monitors his BP and it runs 90/60 and 130/80 with exercise at cardiac rehab.  The pt would like to know if he needs to stop his metoprolol. I will forward this information to Dr Burt Knack to review.

## 2015-03-23 NOTE — Telephone Encounter (Signed)
I spoke with the pt and made him aware of Dr Antionette Char recommendations.  The pt will stop metoprolol and continue to monitor BP.

## 2015-03-23 NOTE — Telephone Encounter (Signed)
Would be ok to stop metoprolol in setting of low BP and take zantac or pepcid as needed for GERD symptoms

## 2015-03-24 ENCOUNTER — Encounter (HOSPITAL_COMMUNITY): Payer: BLUE CROSS/BLUE SHIELD

## 2015-03-27 ENCOUNTER — Encounter (HOSPITAL_COMMUNITY)
Admission: RE | Admit: 2015-03-27 | Discharge: 2015-03-27 | Disposition: A | Discharge status: Home / Self Care | Payer: BLUE CROSS/BLUE SHIELD | Source: Ambulatory Visit | Attending: Cardiovascular Disease | Admitting: Cardiovascular Disease

## 2015-03-27 ENCOUNTER — Telehealth (HOSPITAL_COMMUNITY): Payer: Self-pay | Admitting: *Deleted

## 2015-03-27 DIAGNOSIS — Z955 Presence of coronary angioplasty implant and graft: Secondary | ICD-10-CM | POA: Diagnosis not present

## 2015-03-27 NOTE — Progress Notes (Signed)
Reviewed home exercise with pt today.  Pt plans to continue walking and jogging at home for exercise.  Reviewed THR, pulse, RPE, sign and symptoms, NTG use, and when to call 911 or MD.  Pt voiced understanding. Alberteen Sam, MA, ACSM RCEP

## 2015-03-27 NOTE — Telephone Encounter (Signed)
-----   Message from Sherren Mocha, MD sent at 03/27/2015  1:28 PM EST ----- Regarding: RE: Exercise Prescription Change All of that is ok. I would try to keep upper limit < 150 bpm. thx ----- Message -----    From: Clotilde Dieter    Sent: 03/27/2015  11:47 AM      To: Sherren Mocha, MD Subject: Exercise Prescription Change                   Dr. Burt Knack,  Pt is interested in doing high intensity interval training (HIIT) in Cardiac Rehab.  They have been in program for 2 weeks and have been doing great.  We would like to change their exercise prescription to include HIIT.  Their current THR is 62-125 (40-80%) and we would like to increase it to 148 max (95 %) for HIIT.  Would this be a good upper limit now that he has stopped he metoprolol.  Or should it be closer to 160?  Their RPE levels for the HIIT would reach up to 16-17 and active rest would be 11-13.  They would start at 2 min of active rest and 30 sec of high intensity and progress as tolerated.   Also, he has returned to jogging at home and would like to do so here as well.  Would this be okay with you?  If you are agreeable to this change in exercise prescription please let us know.   Thanks so much for your help! Alberteen Sam, MA, ACSM RCEP

## 2015-03-29 ENCOUNTER — Encounter (HOSPITAL_COMMUNITY)
Admission: RE | Admit: 2015-03-29 | Discharge: 2015-03-29 | Disposition: A | Discharge status: Home / Self Care | Payer: BLUE CROSS/BLUE SHIELD | Source: Ambulatory Visit | Attending: Cardiovascular Disease | Admitting: Cardiovascular Disease

## 2015-03-29 DIAGNOSIS — Z955 Presence of coronary angioplasty implant and graft: Secondary | ICD-10-CM | POA: Diagnosis not present

## 2015-03-31 ENCOUNTER — Encounter (HOSPITAL_COMMUNITY)
Admission: RE | Admit: 2015-03-31 | Discharge: 2015-03-31 | Disposition: A | Discharge status: Home / Self Care | Payer: BLUE CROSS/BLUE SHIELD | Source: Ambulatory Visit | Attending: Cardiovascular Disease | Admitting: Cardiovascular Disease

## 2015-03-31 DIAGNOSIS — Z955 Presence of coronary angioplasty implant and graft: Secondary | ICD-10-CM | POA: Diagnosis not present

## 2015-04-03 ENCOUNTER — Encounter (HOSPITAL_COMMUNITY)
Admission: RE | Admit: 2015-04-03 | Discharge: 2015-04-03 | Disposition: A | Discharge status: Home / Self Care | Payer: BLUE CROSS/BLUE SHIELD | Source: Ambulatory Visit | Attending: Cardiovascular Disease | Admitting: Cardiovascular Disease

## 2015-04-03 DIAGNOSIS — Z955 Presence of coronary angioplasty implant and graft: Secondary | ICD-10-CM | POA: Diagnosis not present

## 2015-04-04 ENCOUNTER — Encounter: Payer: Self-pay | Admitting: Sports Medicine

## 2015-04-04 ENCOUNTER — Ambulatory Visit (INDEPENDENT_AMBULATORY_CARE_PROVIDER_SITE_OTHER): Payer: BLUE CROSS/BLUE SHIELD | Admitting: Sports Medicine

## 2015-04-04 VITALS — BP 118/84 | Ht 74.0 in | Wt 183.0 lb

## 2015-04-04 DIAGNOSIS — M7701 Medial epicondylitis, right elbow: Secondary | ICD-10-CM

## 2015-04-04 NOTE — Progress Notes (Signed)
   Subjective:    Patient ID: Jake Kennedy, male    DOB: [birth year], 65 y.o.   MRN: [id number]  HPI   Patient comes in today for follow-up on chronic right elbow medial epicondylitis. He has had several physical therapy visits and feels like he is making progress. However, he is not completely symptom free. The numbness and swelling that he was experiencing along the medial elbow has resolved. His pain is more intermittent now. However, it does reoccur with repetitive motion or with lifting heavier objects. He is tolerating his nitroglycerin patches. He is 4 weeks into his treatment. An ultrasound done at his last office visit showed calcification within the common flexor tendon with some surrounding hypoechoic changes consistent with chronic interstitial tearing.    Review of Systems     Objective:   Physical Exam Well-developed, well-nourished. No acute distress. Vital signs reviewed  Right elbow: Full range of motion. No effusion. No soft tissue swelling. Mild tenderness to palpation directly over the medial epicondyle. Mild reproducible pain with resisted wrist flexion and ulnar deviation. Good elbow stability. Neurovascularly intact distally.       Assessment & Plan:   Chronic right elbow medial epicondylitis  Patient is making slow improvement in physical therapy. I decided to wait on a repeat ultrasound today and instead I will repeat his scan at his follow-up visit in 6-8 weeks. He understands the importance of continuing with physical therapy until discharge and he also understands the importance of continuing with his home exercise program thereafter. Continue with nitroglycerin as well. I've also given a new prescription for the physical therapist to consider iontophoresis. Patient is anxious to return to golf but I think he should wait on that for now. I did discuss the possibility of more advanced imaging and surgical referral if his symptoms do not resolve but he is currently on  Plavix so any type of elective surgery will not be entertained until he is off of all anticoagulants.

## 2015-04-05 ENCOUNTER — Encounter (HOSPITAL_COMMUNITY)
Admission: RE | Admit: 2015-04-05 | Discharge: 2015-04-05 | Disposition: A | Discharge status: Home / Self Care | Payer: BLUE CROSS/BLUE SHIELD | Source: Ambulatory Visit | Attending: Cardiovascular Disease | Admitting: Cardiovascular Disease

## 2015-04-05 ENCOUNTER — Other Ambulatory Visit (INDEPENDENT_AMBULATORY_CARE_PROVIDER_SITE_OTHER): Payer: BLUE CROSS/BLUE SHIELD | Admitting: *Deleted

## 2015-04-05 DIAGNOSIS — Z955 Presence of coronary angioplasty implant and graft: Secondary | ICD-10-CM | POA: Diagnosis not present

## 2015-04-05 DIAGNOSIS — I2582 Chronic total occlusion of coronary artery: Secondary | ICD-10-CM | POA: Insufficient documentation

## 2015-04-05 DIAGNOSIS — I2510 Atherosclerotic heart disease of native coronary artery without angina pectoris: Secondary | ICD-10-CM

## 2015-04-05 LAB — LIPID PANEL
CHOL/HDL RATIO: 3.4 ratio (ref <=5.0)
CHOLESTEROL: 105 mg/dL — AB (ref 125–200)
HDL: 31 mg/dL — AB (ref >=40)
LDL Cholesterol: 54 mg/dL (ref <130)
TRIGLYCERIDES: 98 mg/dL (ref <150)
VLDL: 20 mg/dL (ref <30)

## 2015-04-05 LAB — HEPATIC FUNCTION PANEL
ALBUMIN: 4.2 g/dL (ref 3.6–5.1)
ALT: 42 U/L (ref 9–46)
AST: 24 U/L (ref 10–35)
Alkaline Phosphatase: 61 U/L (ref 40–115)
Bilirubin, Direct: 0.2 mg/dL (ref <=0.2)
Indirect Bilirubin: 0.5 mg/dL (ref 0.2–1.2)
TOTAL PROTEIN: 6.3 g/dL (ref 6.1–8.1)
Total Bilirubin: 0.7 mg/dL (ref 0.2–1.2)

## 2015-04-05 LAB — HEMOGLOBIN A1C
HEMOGLOBIN A1C: 5.7 % — AB (ref <5.7)
MEAN PLASMA GLUCOSE: 117 mg/dL — AB (ref <117)

## 2015-04-07 ENCOUNTER — Encounter (HOSPITAL_COMMUNITY)
Admission: RE | Admit: 2015-04-07 | Discharge: 2015-04-07 | Disposition: A | Discharge status: Home / Self Care | Payer: BLUE CROSS/BLUE SHIELD | Source: Ambulatory Visit | Attending: Cardiovascular Disease | Admitting: Cardiovascular Disease

## 2015-04-07 DIAGNOSIS — Z955 Presence of coronary angioplasty implant and graft: Secondary | ICD-10-CM | POA: Diagnosis not present

## 2015-04-10 ENCOUNTER — Telehealth (HOSPITAL_COMMUNITY): Payer: Self-pay | Admitting: *Deleted

## 2015-04-10 ENCOUNTER — Encounter (HOSPITAL_COMMUNITY): Payer: BLUE CROSS/BLUE SHIELD

## 2015-04-12 ENCOUNTER — Encounter (HOSPITAL_COMMUNITY)
Admission: RE | Admit: 2015-04-12 | Discharge: 2015-04-12 | Disposition: A | Discharge status: Home / Self Care | Payer: BLUE CROSS/BLUE SHIELD | Source: Ambulatory Visit | Attending: Cardiovascular Disease | Admitting: Cardiovascular Disease

## 2015-04-12 DIAGNOSIS — Z955 Presence of coronary angioplasty implant and graft: Secondary | ICD-10-CM | POA: Diagnosis not present

## 2015-04-14 ENCOUNTER — Encounter (HOSPITAL_COMMUNITY): Payer: BLUE CROSS/BLUE SHIELD

## 2015-04-17 ENCOUNTER — Encounter (HOSPITAL_COMMUNITY)
Admission: RE | Admit: 2015-04-17 | Discharge: 2015-04-17 | Disposition: A | Discharge status: Home / Self Care | Payer: BLUE CROSS/BLUE SHIELD | Source: Ambulatory Visit | Attending: Cardiovascular Disease | Admitting: Cardiovascular Disease

## 2015-04-17 DIAGNOSIS — Z955 Presence of coronary angioplasty implant and graft: Secondary | ICD-10-CM | POA: Diagnosis not present

## 2015-04-17 NOTE — Progress Notes (Signed)
Jake Kennedy 65 y.o. male Nutrition Note Spoke with pt. Nutrition Survey reviewed with pt. Pt is following Step 2 of the Therapeutic Lifestyle Changes diet. Pt is aware of pre-diabetes dx. Pt states his mom had DM. Pre-DM discussed. Pt expressed understanding of the information reviewed. Pt aware of nutrition education classes offered. Lab Results  Component Value Date   HGBA1C 5.7* 04/05/2015   Wt Readings from Last 3 Encounters:  04/04/15 183 lb (83.008 kg)  03/16/15 182 lb 12.2 oz (82.9 kg)  03/07/15 179 lb 12.8 oz (81.557 kg)   Nutrition Diagnosis ? Food-and nutrition-related knowledge deficit related to lack of exposure to information as related to diagnosis of: ? CVD ? Pre-DM Nutrition Intervention ? Benefits of adopting Therapeutic Lifestyle Changes discussed when Medficts reviewed. ? Pt to attend the Portion Distortion class ? Pt to attend the  ? Nutrition I class                        ? Nutrition II class ? Pt given handouts for: ? Nutrition I class ? Nutrition II class ? Continue client-centered nutrition education by RD, as part of interdisciplinary care.  Goal(s) ? Pt to describe the benefit of including fruits, vegetables, whole grains, and low-fat dairy products in a heart healthy meal plan.  Monitor and Evaluate progress toward nutrition goal with team.  Derek Mound, M.Ed, RD, LDN, CDE 04/17/2015 11:59 AM

## 2015-04-19 ENCOUNTER — Encounter (HOSPITAL_COMMUNITY)
Admission: RE | Admit: 2015-04-19 | Discharge: 2015-04-19 | Disposition: A | Discharge status: Home / Self Care | Payer: BLUE CROSS/BLUE SHIELD | Source: Ambulatory Visit | Attending: Cardiovascular Disease | Admitting: Cardiovascular Disease

## 2015-04-19 DIAGNOSIS — Z955 Presence of coronary angioplasty implant and graft: Secondary | ICD-10-CM | POA: Diagnosis not present

## 2015-04-21 ENCOUNTER — Encounter (HOSPITAL_COMMUNITY)
Admission: RE | Admit: 2015-04-21 | Discharge: 2015-04-21 | Disposition: A | Discharge status: Home / Self Care | Payer: BLUE CROSS/BLUE SHIELD | Source: Ambulatory Visit | Attending: Cardiovascular Disease | Admitting: Cardiovascular Disease

## 2015-04-21 DIAGNOSIS — Z955 Presence of coronary angioplasty implant and graft: Secondary | ICD-10-CM | POA: Diagnosis not present

## 2015-04-24 ENCOUNTER — Encounter (HOSPITAL_COMMUNITY)
Admission: RE | Admit: 2015-04-24 | Discharge: 2015-04-24 | Disposition: A | Discharge status: Home / Self Care | Payer: BLUE CROSS/BLUE SHIELD | Source: Ambulatory Visit | Attending: Cardiovascular Disease | Admitting: Cardiovascular Disease

## 2015-04-24 DIAGNOSIS — Z955 Presence of coronary angioplasty implant and graft: Secondary | ICD-10-CM | POA: Diagnosis not present

## 2015-04-26 ENCOUNTER — Encounter (HOSPITAL_COMMUNITY)
Admission: RE | Admit: 2015-04-26 | Discharge: 2015-04-26 | Disposition: A | Discharge status: Home / Self Care | Payer: BLUE CROSS/BLUE SHIELD | Source: Ambulatory Visit | Attending: Cardiovascular Disease | Admitting: Cardiovascular Disease

## 2015-04-26 DIAGNOSIS — Z955 Presence of coronary angioplasty implant and graft: Secondary | ICD-10-CM | POA: Diagnosis not present

## 2015-04-28 ENCOUNTER — Encounter (HOSPITAL_COMMUNITY)
Admission: RE | Admit: 2015-04-28 | Discharge: 2015-04-28 | Disposition: A | Discharge status: Home / Self Care | Payer: BLUE CROSS/BLUE SHIELD | Source: Ambulatory Visit | Attending: Cardiovascular Disease | Admitting: Cardiovascular Disease

## 2015-04-28 DIAGNOSIS — Z955 Presence of coronary angioplasty implant and graft: Secondary | ICD-10-CM | POA: Diagnosis not present

## 2015-05-01 ENCOUNTER — Encounter (HOSPITAL_COMMUNITY)
Admission: RE | Admit: 2015-05-01 | Discharge: 2015-05-01 | Disposition: A | Discharge status: Home / Self Care | Payer: BLUE CROSS/BLUE SHIELD | Source: Ambulatory Visit | Attending: Cardiovascular Disease | Admitting: Cardiovascular Disease

## 2015-05-01 DIAGNOSIS — Z955 Presence of coronary angioplasty implant and graft: Secondary | ICD-10-CM | POA: Diagnosis not present

## 2015-05-03 ENCOUNTER — Encounter (HOSPITAL_COMMUNITY)
Admission: RE | Admit: 2015-05-03 | Discharge: 2015-05-03 | Disposition: A | Discharge status: Home / Self Care | Payer: BLUE CROSS/BLUE SHIELD | Source: Ambulatory Visit | Attending: Cardiovascular Disease | Admitting: Cardiovascular Disease

## 2015-05-03 DIAGNOSIS — Z955 Presence of coronary angioplasty implant and graft: Secondary | ICD-10-CM | POA: Diagnosis not present

## 2015-05-05 ENCOUNTER — Encounter (HOSPITAL_COMMUNITY): Payer: BLUE CROSS/BLUE SHIELD

## 2015-05-08 ENCOUNTER — Encounter (HOSPITAL_COMMUNITY): Payer: BLUE CROSS/BLUE SHIELD

## 2015-05-09 DIAGNOSIS — M7701 Medial epicondylitis, right elbow: Secondary | ICD-10-CM | POA: Diagnosis not present

## 2015-05-09 DIAGNOSIS — M25521 Pain in right elbow: Secondary | ICD-10-CM | POA: Diagnosis not present

## 2015-05-09 DIAGNOSIS — M6281 Muscle weakness (generalized): Secondary | ICD-10-CM | POA: Diagnosis not present

## 2015-05-09 DIAGNOSIS — R293 Abnormal posture: Secondary | ICD-10-CM | POA: Diagnosis not present

## 2015-05-10 ENCOUNTER — Encounter (HOSPITAL_COMMUNITY)
Admission: RE | Admit: 2015-05-10 | Discharge: 2015-05-10 | Disposition: A | Discharge status: Home / Self Care | Payer: BLUE CROSS/BLUE SHIELD | Source: Ambulatory Visit | Attending: Cardiovascular Disease | Admitting: Cardiovascular Disease

## 2015-05-10 DIAGNOSIS — I2582 Chronic total occlusion of coronary artery: Secondary | ICD-10-CM | POA: Diagnosis not present

## 2015-05-10 DIAGNOSIS — Z955 Presence of coronary angioplasty implant and graft: Secondary | ICD-10-CM | POA: Diagnosis not present

## 2015-05-11 ENCOUNTER — Telehealth: Payer: Self-pay | Admitting: Cardiovascular Disease

## 2015-05-11 DIAGNOSIS — M7701 Medial epicondylitis, right elbow: Secondary | ICD-10-CM | POA: Diagnosis not present

## 2015-05-11 DIAGNOSIS — M25521 Pain in right elbow: Secondary | ICD-10-CM | POA: Diagnosis not present

## 2015-05-11 DIAGNOSIS — R293 Abnormal posture: Secondary | ICD-10-CM | POA: Diagnosis not present

## 2015-05-11 DIAGNOSIS — M6281 Muscle weakness (generalized): Secondary | ICD-10-CM | POA: Diagnosis not present

## 2015-05-11 NOTE — Telephone Encounter (Addendum)
Patient st he has not taken Metoprolol in a while, but was asked about it at Cardiac Rehab. Confirmed with patient he was to STOP METOPROLOL as of 2/16. Med list updated. Patient was grateful for call.

## 2015-05-11 NOTE — Telephone Encounter (Signed)
New message   Pt is calling to speak to rn   He wants to confirm that he is not to take Metoprolol   He is not having any medication issues

## 2015-05-12 ENCOUNTER — Encounter (HOSPITAL_COMMUNITY)
Admission: RE | Admit: 2015-05-12 | Discharge: 2015-05-12 | Disposition: A | Discharge status: Home / Self Care | Payer: BLUE CROSS/BLUE SHIELD | Source: Ambulatory Visit | Attending: Cardiovascular Disease | Admitting: Cardiovascular Disease

## 2015-05-12 DIAGNOSIS — I2582 Chronic total occlusion of coronary artery: Secondary | ICD-10-CM | POA: Diagnosis not present

## 2015-05-12 DIAGNOSIS — Z955 Presence of coronary angioplasty implant and graft: Secondary | ICD-10-CM | POA: Diagnosis not present

## 2015-05-15 ENCOUNTER — Encounter (HOSPITAL_COMMUNITY)
Admission: RE | Admit: 2015-05-15 | Discharge: 2015-05-15 | Disposition: A | Discharge status: Home / Self Care | Payer: BLUE CROSS/BLUE SHIELD | Source: Ambulatory Visit | Attending: Cardiovascular Disease | Admitting: Cardiovascular Disease

## 2015-05-15 DIAGNOSIS — I2582 Chronic total occlusion of coronary artery: Secondary | ICD-10-CM | POA: Diagnosis not present

## 2015-05-15 DIAGNOSIS — Z955 Presence of coronary angioplasty implant and graft: Secondary | ICD-10-CM | POA: Diagnosis not present

## 2015-05-16 DIAGNOSIS — M6281 Muscle weakness (generalized): Secondary | ICD-10-CM | POA: Diagnosis not present

## 2015-05-16 DIAGNOSIS — R293 Abnormal posture: Secondary | ICD-10-CM | POA: Diagnosis not present

## 2015-05-16 DIAGNOSIS — M7701 Medial epicondylitis, right elbow: Secondary | ICD-10-CM | POA: Diagnosis not present

## 2015-05-16 DIAGNOSIS — M25521 Pain in right elbow: Secondary | ICD-10-CM | POA: Diagnosis not present

## 2015-05-17 ENCOUNTER — Encounter (HOSPITAL_COMMUNITY): Payer: BLUE CROSS/BLUE SHIELD

## 2015-05-19 ENCOUNTER — Encounter (HOSPITAL_COMMUNITY): Admission: RE | Admit: 2015-05-19 | Payer: BLUE CROSS/BLUE SHIELD | Source: Ambulatory Visit

## 2015-05-22 ENCOUNTER — Encounter (HOSPITAL_COMMUNITY)
Admission: RE | Admit: 2015-05-22 | Discharge: 2015-05-22 | Disposition: A | Discharge status: Home / Self Care | Payer: BLUE CROSS/BLUE SHIELD | Source: Ambulatory Visit | Attending: Cardiovascular Disease | Admitting: Cardiovascular Disease

## 2015-05-22 DIAGNOSIS — Z955 Presence of coronary angioplasty implant and graft: Secondary | ICD-10-CM | POA: Diagnosis not present

## 2015-05-22 DIAGNOSIS — I2582 Chronic total occlusion of coronary artery: Secondary | ICD-10-CM | POA: Diagnosis not present

## 2015-05-23 ENCOUNTER — Ambulatory Visit (INDEPENDENT_AMBULATORY_CARE_PROVIDER_SITE_OTHER): Payer: BLUE CROSS/BLUE SHIELD | Admitting: Sports Medicine

## 2015-05-23 ENCOUNTER — Encounter: Payer: Self-pay | Admitting: Sports Medicine

## 2015-05-23 VITALS — Ht 74.0 in | Wt 180.0 lb

## 2015-05-23 DIAGNOSIS — M25521 Pain in right elbow: Secondary | ICD-10-CM | POA: Diagnosis not present

## 2015-05-23 DIAGNOSIS — M7701 Medial epicondylitis, right elbow: Secondary | ICD-10-CM | POA: Diagnosis not present

## 2015-05-23 DIAGNOSIS — R293 Abnormal posture: Secondary | ICD-10-CM | POA: Diagnosis not present

## 2015-05-23 DIAGNOSIS — M6281 Muscle weakness (generalized): Secondary | ICD-10-CM | POA: Diagnosis not present

## 2015-05-23 NOTE — Progress Notes (Signed)
   Subjective:    Patient ID: Jake Kennedy, male    DOB: [birth year], 65 y.o.   MRN: [id number]  HPI  Patient comes in today for follow-up on right elbow medial epicondylitis. Overall, he has noticed improvement in his symptoms. In fact, his pain in the medial epicondyle has resolved. He has had no pain for the past 2 weeks. He has been very diligent about attending physical therapy for the past 2 months. They were initially having some success with iontophoresis but when they switched to using a patch to deliver the electrical current, he developed a contact dermatitis and so has been unable to get any more treatments. He continues with his nitroglycerin patch. He is tolerating that without difficulty. He has yet to return to golf. Although his pain has resolved he has still noticing some intermittent swelling in the medial elbow. He denies numbness or tingling. No weakness.   Review of Systems     Objective:   Physical Exam  Well-developed, well-nourished. No acute distress.  Right elbow: Full range of motion. No effusion. There is a resolving contact dermatitis in the volar forearm. No signs of infection. No drainage. There is prominence of the pronator teres muscle as the patient pronates his forearm. It is nontender to palpation. He has no tenderness to palpation over the medial epicondyle. No pain with resisted wrist flexion and ulnar deviation. Good grip strength. Neurovascularly intact distally.  MSK ultrasound of the right elbow was performed. Limited images of the medial elbow were obtained. The calcification and hypoechoic changes seen on his previous ultrasound in the common flexor tendon are much less obvious today. A scan over the pronator teres shows an intact muscle with no abnormality and no soft tissue swelling.       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  I think the bulge that he is noticing along the medial elbow may in fact be due to a small fascial herniation of the bicipital aponeurosis which allows for herniation of the pronator teres muscle with forearm pronation. It is certainly not painful. He will try a compression sleeve with activity. Physical therapy was offering dry needling to the area but I do not think that is necessary. I also think that he needs to hold on further iontophoresis treatments if they are going to have to use the contact pads that caused his previous contact dermatitis. He will continue with his rehab exercises under the guidance of physical therapy and will follow-up with me again in 6 weeks.  Call me with questions or concerns in the interim.

## 2015-05-24 ENCOUNTER — Encounter (HOSPITAL_COMMUNITY)
Admission: RE | Admit: 2015-05-24 | Discharge: 2015-05-24 | Disposition: A | Discharge status: Home / Self Care | Payer: BLUE CROSS/BLUE SHIELD | Source: Ambulatory Visit | Attending: Cardiovascular Disease | Admitting: Cardiovascular Disease

## 2015-05-24 DIAGNOSIS — I2582 Chronic total occlusion of coronary artery: Secondary | ICD-10-CM | POA: Diagnosis not present

## 2015-05-24 DIAGNOSIS — Z955 Presence of coronary angioplasty implant and graft: Secondary | ICD-10-CM | POA: Diagnosis not present

## 2015-05-25 DIAGNOSIS — R293 Abnormal posture: Secondary | ICD-10-CM | POA: Diagnosis not present

## 2015-05-25 DIAGNOSIS — M7701 Medial epicondylitis, right elbow: Secondary | ICD-10-CM | POA: Diagnosis not present

## 2015-05-25 DIAGNOSIS — M25521 Pain in right elbow: Secondary | ICD-10-CM | POA: Diagnosis not present

## 2015-05-25 DIAGNOSIS — M6281 Muscle weakness (generalized): Secondary | ICD-10-CM | POA: Diagnosis not present

## 2015-05-26 ENCOUNTER — Encounter (HOSPITAL_COMMUNITY)
Admission: RE | Admit: 2015-05-26 | Discharge: 2015-05-26 | Disposition: A | Discharge status: Home / Self Care | Payer: BLUE CROSS/BLUE SHIELD | Source: Ambulatory Visit | Attending: Cardiovascular Disease | Admitting: Cardiovascular Disease

## 2015-05-26 DIAGNOSIS — Z955 Presence of coronary angioplasty implant and graft: Secondary | ICD-10-CM | POA: Diagnosis not present

## 2015-05-26 DIAGNOSIS — I2582 Chronic total occlusion of coronary artery: Secondary | ICD-10-CM | POA: Diagnosis not present

## 2015-05-29 ENCOUNTER — Encounter (HOSPITAL_COMMUNITY)
Admission: RE | Admit: 2015-05-29 | Discharge: 2015-05-29 | Disposition: A | Discharge status: Home / Self Care | Payer: BLUE CROSS/BLUE SHIELD | Source: Ambulatory Visit | Attending: Cardiovascular Disease | Admitting: Cardiovascular Disease

## 2015-05-29 DIAGNOSIS — I2582 Chronic total occlusion of coronary artery: Secondary | ICD-10-CM | POA: Diagnosis not present

## 2015-05-29 DIAGNOSIS — Z955 Presence of coronary angioplasty implant and graft: Secondary | ICD-10-CM | POA: Diagnosis not present

## 2015-05-30 DIAGNOSIS — R293 Abnormal posture: Secondary | ICD-10-CM | POA: Diagnosis not present

## 2015-05-30 DIAGNOSIS — M7701 Medial epicondylitis, right elbow: Secondary | ICD-10-CM | POA: Diagnosis not present

## 2015-05-30 DIAGNOSIS — M6281 Muscle weakness (generalized): Secondary | ICD-10-CM | POA: Diagnosis not present

## 2015-05-30 DIAGNOSIS — M25521 Pain in right elbow: Secondary | ICD-10-CM | POA: Diagnosis not present

## 2015-05-31 ENCOUNTER — Encounter (HOSPITAL_COMMUNITY)
Admission: RE | Admit: 2015-05-31 | Discharge: 2015-05-31 | Disposition: A | Discharge status: Home / Self Care | Payer: BLUE CROSS/BLUE SHIELD | Source: Ambulatory Visit | Attending: Cardiovascular Disease | Admitting: Cardiovascular Disease

## 2015-05-31 DIAGNOSIS — I2582 Chronic total occlusion of coronary artery: Secondary | ICD-10-CM | POA: Diagnosis not present

## 2015-05-31 DIAGNOSIS — Z955 Presence of coronary angioplasty implant and graft: Secondary | ICD-10-CM | POA: Diagnosis not present

## 2015-06-01 DIAGNOSIS — M6281 Muscle weakness (generalized): Secondary | ICD-10-CM | POA: Diagnosis not present

## 2015-06-01 DIAGNOSIS — R293 Abnormal posture: Secondary | ICD-10-CM | POA: Diagnosis not present

## 2015-06-01 DIAGNOSIS — M25521 Pain in right elbow: Secondary | ICD-10-CM | POA: Diagnosis not present

## 2015-06-01 DIAGNOSIS — M7701 Medial epicondylitis, right elbow: Secondary | ICD-10-CM | POA: Diagnosis not present

## 2015-06-02 ENCOUNTER — Encounter (HOSPITAL_COMMUNITY)
Admission: RE | Admit: 2015-06-02 | Discharge: 2015-06-02 | Disposition: A | Discharge status: Home / Self Care | Payer: BLUE CROSS/BLUE SHIELD | Source: Ambulatory Visit | Attending: Cardiovascular Disease | Admitting: Cardiovascular Disease

## 2015-06-02 DIAGNOSIS — Z955 Presence of coronary angioplasty implant and graft: Secondary | ICD-10-CM | POA: Diagnosis not present

## 2015-06-02 DIAGNOSIS — I2582 Chronic total occlusion of coronary artery: Secondary | ICD-10-CM | POA: Diagnosis not present

## 2015-06-05 ENCOUNTER — Encounter (HOSPITAL_COMMUNITY)
Admission: RE | Admit: 2015-06-05 | Discharge: 2015-06-05 | Disposition: A | Discharge status: Home / Self Care | Payer: BLUE CROSS/BLUE SHIELD | Source: Ambulatory Visit | Attending: Cardiovascular Disease | Admitting: Cardiovascular Disease

## 2015-06-05 DIAGNOSIS — I2582 Chronic total occlusion of coronary artery: Secondary | ICD-10-CM | POA: Diagnosis not present

## 2015-06-05 DIAGNOSIS — Z955 Presence of coronary angioplasty implant and graft: Secondary | ICD-10-CM | POA: Insufficient documentation

## 2015-06-06 DIAGNOSIS — M25521 Pain in right elbow: Secondary | ICD-10-CM | POA: Diagnosis not present

## 2015-06-06 DIAGNOSIS — R293 Abnormal posture: Secondary | ICD-10-CM | POA: Diagnosis not present

## 2015-06-06 DIAGNOSIS — M7701 Medial epicondylitis, right elbow: Secondary | ICD-10-CM | POA: Diagnosis not present

## 2015-06-06 DIAGNOSIS — M6281 Muscle weakness (generalized): Secondary | ICD-10-CM | POA: Diagnosis not present

## 2015-06-07 ENCOUNTER — Encounter (HOSPITAL_COMMUNITY)
Admission: RE | Admit: 2015-06-07 | Discharge: 2015-06-07 | Disposition: A | Discharge status: Home / Self Care | Payer: BLUE CROSS/BLUE SHIELD | Source: Ambulatory Visit | Attending: Cardiovascular Disease | Admitting: Cardiovascular Disease

## 2015-06-07 DIAGNOSIS — Z955 Presence of coronary angioplasty implant and graft: Secondary | ICD-10-CM | POA: Diagnosis not present

## 2015-06-07 DIAGNOSIS — I2582 Chronic total occlusion of coronary artery: Secondary | ICD-10-CM | POA: Diagnosis not present

## 2015-06-08 DIAGNOSIS — R293 Abnormal posture: Secondary | ICD-10-CM | POA: Diagnosis not present

## 2015-06-08 DIAGNOSIS — M25521 Pain in right elbow: Secondary | ICD-10-CM | POA: Diagnosis not present

## 2015-06-08 DIAGNOSIS — M6281 Muscle weakness (generalized): Secondary | ICD-10-CM | POA: Diagnosis not present

## 2015-06-08 DIAGNOSIS — M7701 Medial epicondylitis, right elbow: Secondary | ICD-10-CM | POA: Diagnosis not present

## 2015-06-09 ENCOUNTER — Encounter (HOSPITAL_COMMUNITY): Payer: BLUE CROSS/BLUE SHIELD

## 2015-06-12 ENCOUNTER — Encounter (HOSPITAL_COMMUNITY): Admission: RE | Admit: 2015-06-12 | Payer: BLUE CROSS/BLUE SHIELD | Source: Ambulatory Visit

## 2015-06-13 DIAGNOSIS — M6281 Muscle weakness (generalized): Secondary | ICD-10-CM | POA: Diagnosis not present

## 2015-06-13 DIAGNOSIS — R293 Abnormal posture: Secondary | ICD-10-CM | POA: Diagnosis not present

## 2015-06-13 DIAGNOSIS — M25521 Pain in right elbow: Secondary | ICD-10-CM | POA: Diagnosis not present

## 2015-06-13 DIAGNOSIS — M7701 Medial epicondylitis, right elbow: Secondary | ICD-10-CM | POA: Diagnosis not present

## 2015-06-14 ENCOUNTER — Encounter (HOSPITAL_COMMUNITY)
Admission: RE | Admit: 2015-06-14 | Discharge: 2015-06-14 | Disposition: A | Discharge status: Home / Self Care | Payer: BLUE CROSS/BLUE SHIELD | Source: Ambulatory Visit | Attending: Cardiovascular Disease | Admitting: Cardiovascular Disease

## 2015-06-14 DIAGNOSIS — I2582 Chronic total occlusion of coronary artery: Secondary | ICD-10-CM | POA: Diagnosis not present

## 2015-06-14 DIAGNOSIS — Z955 Presence of coronary angioplasty implant and graft: Secondary | ICD-10-CM | POA: Diagnosis not present

## 2015-06-15 DIAGNOSIS — M7701 Medial epicondylitis, right elbow: Secondary | ICD-10-CM | POA: Diagnosis not present

## 2015-06-15 DIAGNOSIS — M6281 Muscle weakness (generalized): Secondary | ICD-10-CM | POA: Diagnosis not present

## 2015-06-15 DIAGNOSIS — M25521 Pain in right elbow: Secondary | ICD-10-CM | POA: Diagnosis not present

## 2015-06-15 DIAGNOSIS — R293 Abnormal posture: Secondary | ICD-10-CM | POA: Diagnosis not present

## 2015-06-16 ENCOUNTER — Encounter (HOSPITAL_COMMUNITY)
Admission: RE | Admit: 2015-06-16 | Discharge: 2015-06-16 | Disposition: A | Discharge status: Home / Self Care | Payer: BLUE CROSS/BLUE SHIELD | Source: Ambulatory Visit | Attending: Cardiovascular Disease | Admitting: Cardiovascular Disease

## 2015-06-16 DIAGNOSIS — Z955 Presence of coronary angioplasty implant and graft: Secondary | ICD-10-CM | POA: Diagnosis not present

## 2015-06-16 DIAGNOSIS — I2582 Chronic total occlusion of coronary artery: Secondary | ICD-10-CM | POA: Diagnosis not present

## 2015-06-19 ENCOUNTER — Ambulatory Visit (INDEPENDENT_AMBULATORY_CARE_PROVIDER_SITE_OTHER): Payer: BLUE CROSS/BLUE SHIELD | Admitting: Cardiovascular Disease

## 2015-06-19 ENCOUNTER — Encounter (HOSPITAL_COMMUNITY)
Admission: RE | Admit: 2015-06-19 | Discharge: 2015-06-19 | Disposition: A | Discharge status: Home / Self Care | Payer: BLUE CROSS/BLUE SHIELD | Source: Ambulatory Visit | Attending: Cardiovascular Disease | Admitting: Cardiovascular Disease

## 2015-06-19 ENCOUNTER — Encounter: Payer: Self-pay | Admitting: Cardiovascular Disease

## 2015-06-19 VITALS — BP 112/62 | HR 56 | Ht 74.0 in | Wt 176.8 lb

## 2015-06-19 DIAGNOSIS — I2510 Atherosclerotic heart disease of native coronary artery without angina pectoris: Secondary | ICD-10-CM | POA: Diagnosis not present

## 2015-06-19 DIAGNOSIS — I2582 Chronic total occlusion of coronary artery: Secondary | ICD-10-CM | POA: Diagnosis not present

## 2015-06-19 DIAGNOSIS — Z955 Presence of coronary angioplasty implant and graft: Secondary | ICD-10-CM | POA: Diagnosis not present

## 2015-06-19 MED ORDER — CLOPIDOGREL BISULFATE 75 MG PO TABS: 75.0000 mg | ORAL_TABLET | Freq: Every day | ORAL | Status: DC

## 2015-06-19 MED ORDER — ATORVASTATIN CALCIUM 80 MG PO TABS: 80.0000 mg | ORAL_TABLET | Freq: Every day | ORAL | Status: DC

## 2015-06-19 MED ORDER — NITROGLYCERIN 0.4 MG SL SUBL: 0.4000 mg | SUBLINGUAL_TABLET | SUBLINGUAL | Status: DC | PRN

## 2015-06-19 NOTE — Progress Notes (Signed)
Cardiology Office Note Date:  06/19/2015   ID:  Jake Kennedy, DOB [birth year], MRN [id number]  PCP:  Irven Shelling, MD  Cardiologist:  Sherren Mocha, MD    Chief Complaint  Patient presents with  . CAD native coronary artery    denies cp,sob,lee,or claudication   History of Present Illness: Jake Kennedy is a 65 y.o. male who presents for follow-up of CAD. Presented with exertional angina in December 2016. He underwent stress testing which demonstrated inferoapical ischemia. Catheterization demonstrated severe LAD stenosis and he was treated with a drug-eluting stent. LV function is normal.  He has done well since PCI. He's had no further problems. He was unable to tolerate a beta blocker at low dose because of fatigue. He's participating in cardiac rehab. Today, he denies symptoms of palpitations, chest pain, shortness of breath, orthopnea, PND, lower extremity edema, dizziness, or syncope.  Past Medical History  Diagnosis Date  . BPH (benign prostatic hypertrophy)   . ED (erectile dysfunction)   . Coronary artery disease   . Basal cell carcinoma     moles removed in 2007 and 2012 from L nose and L hip  . CAD (coronary artery disease), native coronary artery 02/11/2015    cath 02/10/2015 95 eccentric mid LAD stenosis treated with a 3.0 x 16 mm Promus DES, 30% mid LAD lesion, 30% D1 stenosis  . Lymphocytosis 02/11/2015    seen by Dr. Lindi Adie 02/11/2015, working up for possible CLL    Past Surgical History  Procedure Laterality Date  . Laminectomy    . Coronary stent placement  02/10/2015    DES to LAD  . Cardiac catheterization  02/10/2015  . Tonsillectomy    . Cardiac catheterization N/A 02/10/2015    Procedure: Left Heart Cath and Coronary Angiography;  Surgeon: Sherren Mocha, MD;  Location: [facility];  Service: Cardiovascular;  Laterality: N/A;  . Cardiac catheterization N/A 02/10/2015    Procedure: Coronary Stent Intervention;  Surgeon: Sherren Mocha, MD;   Location: [facility];  Service: Cardiovascular;  Laterality: N/A;    Current Outpatient Prescriptions  Medication Sig Dispense Refill  . nitroGLYCERIN (NITRODUR - DOSED IN MG/24 HR) 0.2 mg/hr patch Place 0.05 mg onto the skin daily. Place 1/4 patch to affected area daily    . aspirin 81 MG tablet Take 81 mg by mouth daily.    Marland Kitchen atorvastatin (LIPITOR) 80 MG tablet Take 1 tablet (80 mg total) by mouth daily at 6 PM. 90 tablet 3  . beta carotene w/minerals (OCUVITE) tablet Take 1 tablet by mouth daily.    . clopidogrel (PLAVIX) 75 MG tablet Take 1 tablet (75 mg total) by mouth daily with breakfast. 90 tablet 3  . Multiple Vitamin (MULTIVITAMIN) tablet Take 1 tablet by mouth daily.    . nitroGLYCERIN (NITROSTAT) 0.4 MG SL tablet Place 1 tablet (0.4 mg total) under the tongue every 5 (five) minutes as needed for chest pain. 25 tablet 3  . Omega-3 Fatty Acids (FISH OIL CONCENTRATE PO) Take 1 capsule by mouth daily.      No current facility-administered medications for this visit.   Allergies:   Latex   Social History:  The patient  reports that he has never smoked. He has never used smokeless tobacco. He reports that he drinks alcohol. He reports that he does not use illicit drugs.   Family History:  The patient's  family history includes Diabetes in his mother; Healthy in his brother, brother, maternal grandfather,  maternal grandmother, paternal grandfather, paternal grandmother, and sister; Heart attack in his mother; Heart failure in his mother.    ROS:  Please see the history of present illness. All other systems are reviewed and negative.    PHYSICAL EXAM: VS:  BP 112/62 mmHg  Pulse 56  Ht 6' 2" (1.88 m)  Wt 176 lb 12.8 oz (80.196 kg)  BMI 22.69 kg/m2 , BMI Body mass index is 22.69 kg/(m^2). GEN: Well nourished, well developed, in no acute distress HEENT: normal Neck: no JVD, no masses. No carotid bruits Cardiac: RRR without murmur or gallop                Respiratory:   clear to auscultation bilaterally, normal work of breathing GI: soft, nontender, nondistended, + BS MS: no deformity or atrophy Ext: no pretibial edema, pedal pulses 2+= bilaterally Skin: warm and dry, no rash Neuro:  Strength and sensation are intact Psych: euthymic mood, full affect  EKG:  EKG is not ordered today.  Recent Labs: 02/28/2015: BUN 16.1; Creatinine 1.0; HGB 15.0; Platelets 157; Potassium 4.5; Sodium 141 04/05/2015: ALT 42   Lipid Panel     Component Value Date/Time   CHOL 105* 04/05/2015 0757   TRIG 98 04/05/2015 0757   HDL 31* 04/05/2015 0757   CHOLHDL 3.4 04/05/2015 0757   VLDL 20 04/05/2015 0757   LDLCALC 54 04/05/2015 0757      Wt Readings from Last 3 Encounters:  06/19/15 176 lb 12.8 oz (80.196 kg)  05/23/15 180 lb (81.647 kg)  04/04/15 183 lb (83.008 kg)     Cardiac Studies Reviewed: Echo 02/28/2015: Study Conclusions  - Left ventricle: The cavity size was normal. There was mild focal  basal hypertrophy of the septum. Systolic function was normal.  The estimated ejection fraction was in the range of 60% to 65%.  Wall motion was normal; there were no regional wall motion  abnormalities. Left ventricular diastolic function parameters  were normal. - Aortic valve: Transvalvular velocity was within the normal range.  There was no stenosis. There was no regurgitation. - Aorta: Ascending aortic diameter: 3.72 mm (S). - Mitral valve: Transvalvular velocity was within the normal range.  There was no evidence for stenosis. There was trivial  regurgitation. - Right ventricle: The cavity size was mildly dilated. Wall  thickness was normal. Systolic function was normal. - Tricuspid valve: There was trivial regurgitation. - Pulmonic valve: Transvalvular velocity was within the normal  range. There was no evidence for stenosis. There was mild  regurgitation. - Inferior vena cava: The vessel was normal in size. The  respirophasic diameter changes  were in the normal range (= 50%),  consistent with normal central venous pressure.  Cath 02/10/2015: Conclusion     Mid LAD-2 lesion, 95% stenosed. Post intervention, there is a 0% residual stenosis.  Mid LAD-1 lesion, 30% stenosed.  1st Diag lesion, 30% stenosed.  1. Severe LAD stenosis, treated successfully with PCI using a drug-eluting stent platform 2. Moderate diffuse RCA stenosis, recommend medical therapy 3. Patent left circumflex without significant stenosis  Recommend dual antiplatelet therapy with aspirin and Plavix for 12 months without interruption. The patient needs aggressive risk reduction. A high-dose statin drug will be initiated.     Indications    Exertional angina (HCC) [I20.8 (ICD-10-CM)]    Technique and Indications    INDICATION: Exertional angina, high risk nuclear study  PROCEDURAL DETAILS: The right wrist was prepped, draped, and anesthetized with 1% lidocaine. Using the modified Seldinger technique,  a 5/6 [place] Slender sheath was introduced into the right radial artery. 3 mg of verapamil was administered through the sheath, weight-based unfractionated heparin was administered intravenously. Standard Judkins catheters were used for selective coronary angiography. PCI's performed after the diagnostic procedure, see PCI portion of the note for details. Catheter exchanges were performed over an exchange length guidewire. There were no immediate procedural complications. A TR band was used for radial hemostasis at the completion of the procedure. The patient was transferred to the post catheterization recovery area for further monitoring.  Estimated blood loss <50 mL. There were no immediate complications during the procedure.    Coronary Findings    Dominance: Right   Left Anterior Descending   . Mid LAD-1 lesion, 30% stenosed.   . Mid LAD-2 lesion, 95% stenosed. Eccentric.   Marland Kitchen PCI: The pre-interventional distal flow is normal (TIMI 3). Pre-stent  angioplasty was performed. A drug-eluting stent was placed. Post-stent angioplasty was performed. Maximum pressure: 16 atm. The post-interventional distal flow is normal (TIMI 3). The intervention was successful. No complications occurred at this lesion. There is 95% eccentric mid LAD stenosis. This is a large vessel that supplies the apex and wraps around to supply the inferoapex. Heparin is used for anticoagulation. A therapeutic ACT was achieved. The patient is preloaded with clopidogrel 600 mg on the table. An XB LAD 3.5 cm guide catheter was used. A cougar guidewire was advanced beyond the lesion. The lesion is predilated with a 2.5 mm balloon, stented with a 3.0 x 16 mm Promus DES, and the stent was postdilated with a 3.25 mm noncompliant balloon to 16 atm.  . There is no residual stenosis post intervention.     . First Diagonal Branch   . 1st Diag lesion, 30% stenosed.     Ramus Intermedius  . Vessel is angiographically normal.     Left Circumflex  . Vessel is angiographically normal.     Right Coronary Artery   . Mid RCA lesion, 50% stenosed. Diffuse.      Coronary Diagrams    Diagnostic Diagram           Post-Intervention Diagram            Implants     Permanent Stent   Stent Promus Prem Mr 3.0x16 - [id number] - Implanted       ASSESSMENT AND PLAN: 1.  CAD, native vessel, without angina. The patient is doing very well after PCI. He's had no recurrence of exertional symptoms and he is participating in the high-intensity workouts at cardiac rehabilitation. He will continue on dual antiplatelet therapy through January 2018 then discontinue. He should remain on long-term aspirin. I will see him back in one year. He did have moderate stenosis of the RCA that is appropriate for medical therapy. Might consider stress testing down the road.  2. Hyperlipidemia: Recent lipids reviewed and at goal area he does have low HDL cholesterol. Since his PCI procedure, he has  learned that multiple family members have CAD and it seems to run on his mother's side of the famliy.   Current medicines are reviewed with the patient today.  The patient does not have concerns regarding medicines.  Labs/ tests ordered today include:  No orders of the defined types were placed in this encounter.    Disposition:   FU one year  Signed, Sherren Mocha, MD  06/19/2015 11:03 AM    [facility] [place], [place],   [address] Phone: [phone]; Fax: [phone]

## 2015-06-19 NOTE — Patient Instructions (Signed)
Medication Instructions:  Your physician recommends that you continue on your current medications as directed. Please refer to the Current Medication list given to you today.  You will be able to stop Plavix in January 2018.  Labwork: No new orders.   Testing/Procedures: No new orders.   Follow-Up: Your physician wants you to follow-up in: 1 YEAR with Dr Burt Knack.  You will receive a reminder letter in the mail two months in advance. If you don't receive a letter, please call our office to schedule the follow-up appointment.   Any Other Special Instructions Will Be Listed Below (If Applicable).     If you need a refill on your cardiac medications before your next appointment, please call your pharmacy.

## 2015-06-20 DIAGNOSIS — M25521 Pain in right elbow: Secondary | ICD-10-CM | POA: Diagnosis not present

## 2015-06-20 DIAGNOSIS — M6281 Muscle weakness (generalized): Secondary | ICD-10-CM | POA: Diagnosis not present

## 2015-06-20 DIAGNOSIS — M7701 Medial epicondylitis, right elbow: Secondary | ICD-10-CM | POA: Diagnosis not present

## 2015-06-20 DIAGNOSIS — R293 Abnormal posture: Secondary | ICD-10-CM | POA: Diagnosis not present

## 2015-06-21 ENCOUNTER — Encounter (HOSPITAL_COMMUNITY)
Admission: RE | Admit: 2015-06-21 | Discharge: 2015-06-21 | Disposition: A | Discharge status: Home / Self Care | Payer: BLUE CROSS/BLUE SHIELD | Source: Ambulatory Visit | Attending: Cardiovascular Disease | Admitting: Cardiovascular Disease

## 2015-06-21 DIAGNOSIS — I2582 Chronic total occlusion of coronary artery: Secondary | ICD-10-CM | POA: Diagnosis not present

## 2015-06-21 DIAGNOSIS — Z955 Presence of coronary angioplasty implant and graft: Secondary | ICD-10-CM | POA: Diagnosis not present

## 2015-06-22 DIAGNOSIS — M6281 Muscle weakness (generalized): Secondary | ICD-10-CM | POA: Diagnosis not present

## 2015-06-22 DIAGNOSIS — M25521 Pain in right elbow: Secondary | ICD-10-CM | POA: Diagnosis not present

## 2015-06-22 DIAGNOSIS — R293 Abnormal posture: Secondary | ICD-10-CM | POA: Diagnosis not present

## 2015-06-22 DIAGNOSIS — M7701 Medial epicondylitis, right elbow: Secondary | ICD-10-CM | POA: Diagnosis not present

## 2015-06-23 ENCOUNTER — Encounter (HOSPITAL_COMMUNITY)
Admission: RE | Admit: 2015-06-23 | Discharge: 2015-06-23 | Disposition: A | Discharge status: Home / Self Care | Payer: BLUE CROSS/BLUE SHIELD | Source: Ambulatory Visit | Attending: Cardiovascular Disease | Admitting: Cardiovascular Disease

## 2015-06-23 DIAGNOSIS — I2582 Chronic total occlusion of coronary artery: Secondary | ICD-10-CM | POA: Diagnosis not present

## 2015-06-23 DIAGNOSIS — Z955 Presence of coronary angioplasty implant and graft: Secondary | ICD-10-CM | POA: Diagnosis not present

## 2015-06-26 ENCOUNTER — Encounter (HOSPITAL_COMMUNITY)
Admission: RE | Admit: 2015-06-26 | Discharge: 2015-06-26 | Disposition: A | Discharge status: Home / Self Care | Payer: BLUE CROSS/BLUE SHIELD | Source: Ambulatory Visit | Attending: Cardiovascular Disease | Admitting: Cardiovascular Disease

## 2015-06-26 DIAGNOSIS — Z955 Presence of coronary angioplasty implant and graft: Secondary | ICD-10-CM | POA: Diagnosis not present

## 2015-06-26 DIAGNOSIS — I2582 Chronic total occlusion of coronary artery: Secondary | ICD-10-CM | POA: Diagnosis not present

## 2015-06-28 ENCOUNTER — Encounter (HOSPITAL_COMMUNITY)
Admission: RE | Admit: 2015-06-28 | Discharge: 2015-06-28 | Disposition: A | Discharge status: Home / Self Care | Payer: BLUE CROSS/BLUE SHIELD | Source: Ambulatory Visit | Attending: Cardiovascular Disease | Admitting: Cardiovascular Disease

## 2015-06-28 DIAGNOSIS — I2582 Chronic total occlusion of coronary artery: Secondary | ICD-10-CM | POA: Diagnosis not present

## 2015-06-28 DIAGNOSIS — Z955 Presence of coronary angioplasty implant and graft: Secondary | ICD-10-CM | POA: Diagnosis not present

## 2015-06-29 NOTE — Progress Notes (Signed)
Cardiac Individual Treatment Plan  Patient Details  Name: ROSETTA VIETH MRN: [id number] Date of Birth: [birth year] Referring Provider:    Initial Encounter Date:   Visit Diagnosis: No diagnosis found.  Patient's Home Medications on Admission:  Current outpatient prescriptions:  .  aspirin 81 MG tablet, Take 81 mg by mouth daily., Disp: , Rfl:  .  atorvastatin (LIPITOR) 80 MG tablet, Take 1 tablet (80 mg total) by mouth daily at 6 PM., Disp: 90 tablet, Rfl: 3 .  beta carotene w/minerals (OCUVITE) tablet, Take 1 tablet by mouth daily., Disp: , Rfl:  .  clopidogrel (PLAVIX) 75 MG tablet, Take 1 tablet (75 mg total) by mouth daily with breakfast., Disp: 90 tablet, Rfl: 3 .  Multiple Vitamin (MULTIVITAMIN) tablet, Take 1 tablet by mouth daily., Disp: , Rfl:  .  nitroGLYCERIN (NITRODUR - DOSED IN MG/24 HR) 0.2 mg/hr patch, Place 0.05 mg onto the skin daily. Place 1/4 patch to affected area daily, Disp: , Rfl:  .  nitroGLYCERIN (NITROSTAT) 0.4 MG SL tablet, Place 1 tablet (0.4 mg total) under the tongue every 5 (five) minutes as needed for chest pain., Disp: 25 tablet, Rfl: 3 .  Omega-3 Fatty Acids (FISH OIL CONCENTRATE PO), Take 1 capsule by mouth daily. , Disp: , Rfl:   Past Medical History: Past Medical History  Diagnosis Date  . BPH (benign prostatic hypertrophy)   . ED (erectile dysfunction)   . Coronary artery disease   . Basal cell carcinoma     moles removed in 2007 and 2012 from L nose and L hip  . CAD (coronary artery disease), native coronary artery 02/11/2015    cath 02/10/2015 95 eccentric mid LAD stenosis treated with a 3.0 x 16 mm Promus DES, 30% mid LAD lesion, 30% D1 stenosis  . Lymphocytosis 02/11/2015    seen by Dr. Lindi Adie 02/11/2015, working up for possible CLL    Tobacco Use: History  Smoking status  . Never Smoker   Smokeless tobacco  . Never Used    Labs: Recent Review Flowsheet Data    Labs for ITP Cardiac and Pulmonary Rehab Latest Ref Rng 04/05/2015   Cholestrol  125 - 200 mg/dL 105(L)   LDLCALC <130 mg/dL 54   HDL >=40 mg/dL 31(L)   Trlycerides <150 mg/dL 98   Hemoglobin A1c <5.7 % 5.7(H)      Capillary Blood Glucose: No results found for: GLUCAP   Exercise Target Goals:    Exercise Program Goal: Individual exercise prescription set with THRR, safety & activity barriers. Participant demonstrates ability to understand and report RPE using BORG scale, to self-measure pulse accurately, and to acknowledge the importance of the exercise prescription.  Exercise Prescription Goal: Starting with aerobic activity 30 plus minutes a day, 3 days per week for initial exercise prescription. Provide home exercise prescription and guidelines that participant acknowledges understanding prior to discharge.  Activity Barriers & Risk Stratification:   6 Minute Walk:   Initial Exercise Prescription:   Perform Capillary Blood Glucose checks as needed.  Exercise Prescription Changes:   Exercise Comments:   Discharge Exercise Prescription (Final Exercise Prescription Changes):   Nutrition:  Target Goals: Understanding of nutrition guidelines, daily intake of sodium 1500mg , cholesterol 200mg , calories 30% from fat and 7% or less from saturated fats, daily to have 5 or more servings of fruits and vegetables.  Biometrics:    Nutrition Therapy Plan and Nutrition Goals:   Nutrition Discharge: Nutrition Scores:   Nutrition Goals Re-Evaluation:   Psychosocial: Target  Goals: Acknowledge presence or absence of depression, maximize coping skills, provide positive support system. Participant is able to verbalize types and ability to use techniques and skills needed for reducing stress and depression.  Initial Review & Psychosocial Screening:   Quality of Life Scores:   PHQ-9: Recent Review Flowsheet Data    Depression screen [facility] 2/9 04/04/2015 03/20/2015 03/06/2015   Decreased Interest 0 0 0   Down, Depressed, Hopeless 0 0 0   PHQ - 2  Score 0 0 0      Psychosocial Evaluation and Intervention:   Psychosocial Re-Evaluation:   Vocational Rehabilitation: Provide vocational rehab assistance to qualifying candidates.   Vocational Rehab Evaluation & Intervention:   Education: Education Goals: Education classes will be provided on a weekly basis, covering required topics. Participant will state understanding/return demonstration of topics presented.  Learning Barriers/Preferences:   Education Topics: Count Your Pulse:  -Group instruction provided by verbal instruction, demonstration, patient participation and written materials to support subject.  Instructors address importance of being able to find your pulse and how to count your pulse when at home without a heart monitor.  Patients get hands on experience counting their pulse with staff help and individually.   Heart Attack, Angina, and Risk Factor Modification:  -Group instruction provided by verbal instruction, video, and written materials to support subject.  Instructors address signs and symptoms of angina and heart attacks.    Also discuss risk factors for heart disease and how to make changes to improve heart health risk factors.   Functional Fitness:  -Group instruction provided by verbal instruction, demonstration, patient participation, and written materials to support subject.  Instructors address safety measures for doing things around the house.  Discuss how to get up and down off the floor, how to pick things up properly, how to safely get out of a chair without assistance, and balance training.   Meditation and Mindfulness:  -Group instruction provided by verbal instruction, patient participation, and written materials to support subject.  Instructor addresses importance of mindfulness and meditation practice to help reduce stress and improve awareness.  Instructor also leads participants through a meditation exercise.    Stretching for Flexibility  and Mobility:  -Group instruction provided by verbal instruction, patient participation, and written materials to support subject.  Instructors lead participants through series of stretches that are designed to increase flexibility thus improving mobility.  These stretches are additional exercise for major muscle groups that are typically performed during regular warm up and cool down.   Hands Only CPR Anytime:  -Group instruction provided by verbal instruction, video, patient participation and written materials to support subject.  Instructors co-teach with AHA video for hands only CPR.  Participants get hands on experience with mannequins.   Nutrition I class: Heart Healthy Eating:  -Group instruction provided by PowerPoint slides, verbal discussion, and written materials to support subject matter. The instructor gives an explanation and review of the Therapeutic Lifestyle Changes diet recommendations, which includes a discussion on lipid goals, dietary fat, sodium, fiber, plant stanol/sterol esters, sugar, and the components of a well-balanced, healthy diet.   Nutrition II class: Lifestyle Skills:  -Group instruction provided by PowerPoint slides, verbal discussion, and written materials to support subject matter. The instructor gives an explanation and review of label reading, grocery shopping for heart health, heart healthy recipe modifications, and ways to make healthier choices when eating out.   Diabetes Question & Answer:  -Group instruction provided by PowerPoint slides, verbal discussion, and written materials  to support subject matter. The instructor gives an explanation and review of diabetes co-morbidities, pre- and post-prandial blood glucose goals, pre-exercise blood glucose goals, signs, symptoms, and treatment of hypoglycemia and hyperglycemia, and foot care basics.   Diabetes Blitz:  -Group instruction provided by PowerPoint slides, verbal discussion, and written materials to  support subject matter. The instructor gives an explanation and review of the physiology behind type 1 and type 2 diabetes, diabetes medications and rational behind using different medications, pre- and post-prandial blood glucose recommendations and Hemoglobin A1c goals, diabetes diet, and exercise including blood glucose guidelines for exercising safely.    Portion Distortion:  -Group instruction provided by PowerPoint slides, verbal discussion, written materials, and food models to support subject matter. The instructor gives an explanation of serving size versus portion size, changes in portions sizes over the last 20 years, and what consists of a serving from each food group.   Stress Management:  -Group instruction provided by verbal instruction, video, and written materials to support subject matter.  Instructors review role of stress in heart disease and how to cope with stress positively.     Exercising on Your Own:  -Group instruction provided by verbal instruction, power point, and written materials to support subject.  Instructors discuss benefits of exercise, components of exercise, frequency and intensity of exercise, and end points for exercise.  Also discuss use of nitroglycerin and activating EMS.  Review options of places to exercise outside of rehab.  Review guidelines for sex with heart disease.   Cardiac Drugs I:  -Group instruction provided by verbal instruction and written materials to support subject.  Instructor reviews cardiac drug classes: antiplatelets, anticoagulants, beta blockers, and statins.  Instructor discusses reasons, side effects, and lifestyle considerations for each drug class.   Cardiac Drugs II:  -Group instruction provided by verbal instruction and written materials to support subject.  Instructor reviews cardiac drug classes: angiotensin converting enzyme inhibitors (ACE-I), angiotensin II receptor blockers (ARBs), nitrates, and calcium channel  blockers.  Instructor discusses reasons, side effects, and lifestyle considerations for each drug class.   Anatomy and Physiology of the Circulatory System:  -Group instruction provided by verbal instruction, video, and written materials to support subject.  Reviews functional anatomy of heart, how it relates to various diagnoses, and what role the heart plays in the overall system.   Knowledge Questionnaire Score:   Core Components/Risk Factors/Patient Goals at Admission:   Core Components/Risk Factors/Patient Goals Review:    Core Components/Risk Factors/Patient Goals at Discharge (Final Review):    ITP Comments:     ITP Comments      04/05/15 1142           ITP Comments see paper chart for previous documention          Comments:  Pt is making expected progress toward personal goals after completing 35sessions. Recommend continued exercise and life style modification education including  stress management and relaxation techniques to decrease cardiac risk profile. Cherre Huger, BSN

## 2015-06-30 ENCOUNTER — Encounter (HOSPITAL_COMMUNITY)
Admission: RE | Admit: 2015-06-30 | Discharge: 2015-06-30 | Disposition: A | Discharge status: Home / Self Care | Payer: BLUE CROSS/BLUE SHIELD | Source: Ambulatory Visit | Attending: Cardiovascular Disease | Admitting: Cardiovascular Disease

## 2015-06-30 DIAGNOSIS — Z955 Presence of coronary angioplasty implant and graft: Secondary | ICD-10-CM | POA: Diagnosis not present

## 2015-06-30 DIAGNOSIS — I2582 Chronic total occlusion of coronary artery: Secondary | ICD-10-CM | POA: Diagnosis not present

## 2015-06-30 NOTE — Progress Notes (Signed)
Pt graduated from cardiac rehab program today with completion of 36 exercise sessions in Phase II. Pt maintained good attendance to exercise and education.  Pt made excellent progressduring his participation in rehab as evidenced by increased MET level.  Pt increased from 4.1 to 13.4.  This was due to the addition of high intensity interval training. Medication list reconciled. Repeat  PHQ score-0. Pt with improved quality of life scores post exercise.  Pt wife had a recent medical scare that turned out to be breast cyst.  Pt has made significant lifestyle changes and should be commended for his success. Pt feels he has achieved his goals during cardiac rehab.   Pt has returned to his exercise and routine and once his elbow improves he will resume golf.  Pt plans to continue exercise at local gym, walking and jogging 5-6 days a week for 30-45 minutes.  It was a delight to have this patient in the cardiac rehab program. Maurice Small RN, BSN

## 2015-07-04 ENCOUNTER — Ambulatory Visit (INDEPENDENT_AMBULATORY_CARE_PROVIDER_SITE_OTHER): Payer: BLUE CROSS/BLUE SHIELD | Admitting: Sports Medicine

## 2015-07-04 ENCOUNTER — Encounter: Payer: Self-pay | Admitting: Sports Medicine

## 2015-07-04 VITALS — BP 114/84 | Ht 74.0 in | Wt 180.0 lb

## 2015-07-04 DIAGNOSIS — M7701 Medial epicondylitis, right elbow: Secondary | ICD-10-CM | POA: Diagnosis not present

## 2015-07-04 DIAGNOSIS — M25521 Pain in right elbow: Secondary | ICD-10-CM | POA: Diagnosis not present

## 2015-07-04 NOTE — Progress Notes (Signed)
  Jake Kennedy - 65 y.o. male MRN [id number]  Date of birth: 12/30/1950  SUBJECTIVE:     Jake Kennedy is a 65 y.o. male returning for follow up of right medial epicondylitis.   After being symptom-free for several weeks around 6 weeks ago at our last visit, he returned to golfing which caused return of pain to the right elbow. Pain was the same character as previously on the medial elbow, and caused him to stop playing. He also had an episode of pain caused by resisted pronation when playing with his grandson 2 weeks ago. At that time he noted some swelling without bruising. He continues to go to PT (has reached 30 visits) where he is getting ionto periodically and needling, and wearing NTG patch. Not taking medications for pain.    ROS:     No numbness, tingling, or weakness.   PERTINENT  PMH / PSH FH / / SH:  Past Medical, Surgical, Social, and Family History Reviewed & Updated in the EMR.  Pertinent findings include:  CAD: Had DES placed in the first week of January 2017 to the LAD.   OBJECTIVE: BP 114/84 mmHg  Ht 6\' 2"  (1.88 m)  Wt 180 lb (81.647 kg)  BMI 23.10 kg/m2  Physical Exam:  Vital signs are reviewed. Gen: Well-appearing 65 y.o.male in NAD Right elbow: Mild erythema underlying NTG patch anterior to medial epicondyle, otherwise normal inspection. Full ROM with pronation, supination, flexion, extension. There is focal tenderness to palpation at the medial epicondyle. Strength is normal. Stable to varus, valgus stress. Neurovascularly intact.   ASSESSMENT & PLAN: 65 y.o. male with medial epicondylitis of the right elbow, unimproved.  His medial epicondylitis has worsened since the last visit, failing an attempt at graded return to activities. This has continued to be bothersome despite conservative management for almost a year. We will check MRI of the right elbow (safe with drug-eluting stent placed almost 6 months ago), and follow up pending those results.  Patient seen and  evaluated with the resident. I agree with the above plan of care. Patient has chronic medial epicondylitis that has been unresponsive to conservative treatment including exhaustive physical therapy, rest, topical nitroglycerin, and cortisone injections. I would like to proceed with an MRI scan of his elbow to rule out a high-grade tear of the common extensor tendon. I will call him with those results once available. If a high-grade tear is confirmed, patient may not be able to pursue surgery for several months since he is on anticoagulation currently. Alternatively, we could have him continue with a little more PT. He would definitely be interested in trying more iontophoresis (it sounds like the therapist was unsuccessful in getting a consistent treatment plan regarding this modality).

## 2015-07-05 ENCOUNTER — Ambulatory Visit
Admission: RE | Admit: 2015-07-05 | Discharge: 2015-07-05 | Disposition: A | Discharge status: Home / Self Care | Payer: BLUE CROSS/BLUE SHIELD | Source: Ambulatory Visit | Attending: Sports Medicine | Admitting: Sports Medicine

## 2015-07-05 ENCOUNTER — Other Ambulatory Visit: Payer: BLUE CROSS/BLUE SHIELD

## 2015-07-05 DIAGNOSIS — S56811A Strain of other muscles, fascia and tendons at forearm level, right arm, initial encounter: Secondary | ICD-10-CM | POA: Diagnosis not present

## 2015-07-05 DIAGNOSIS — M25521 Pain in right elbow: Secondary | ICD-10-CM

## 2015-07-06 ENCOUNTER — Other Ambulatory Visit: Payer: BLUE CROSS/BLUE SHIELD

## 2015-07-07 ENCOUNTER — Telehealth: Payer: Self-pay | Admitting: Sports Medicine

## 2015-07-07 NOTE — Telephone Encounter (Signed)
I spoke with the patient on the phone today after reviewing the MRI of his right elbow. He has findings consistent with moderate common flexor tendinosis and partial tearing but there is no evidence of a full-thickness tear. The remainder of his elbow is unremarkable. Based on these findings, we are going to continue with conservative treatment for now. He is on a maintenance program with his physical therapist which involves dry needling and a continuation of his home exercises. I think he should also continue with his nitroglycerin patch. Follow-up with me in 6 weeks for reevaluation and a repeat ultrasound. Since there is no evidence of a full-thickness tear, I definitely think we can hold on surgical referral for now. In fact, he is on blood thinners which would likely preclude him from surgery at this time anyway. However, if his symptoms persist, I would reconsider this once he is off of his blood thinners.

## 2015-08-01 DIAGNOSIS — H52213 Irregular astigmatism, bilateral: Secondary | ICD-10-CM | POA: Diagnosis not present

## 2015-08-01 DIAGNOSIS — H5211 Myopia, right eye: Secondary | ICD-10-CM | POA: Diagnosis not present

## 2015-08-14 ENCOUNTER — Ambulatory Visit: Payer: BLUE CROSS/BLUE SHIELD | Admitting: Sports Medicine

## 2015-08-21 ENCOUNTER — Ambulatory Visit: Payer: BLUE CROSS/BLUE SHIELD | Admitting: Sports Medicine

## 2015-08-23 ENCOUNTER — Telehealth: Payer: Self-pay | Admitting: Hematology and Oncology

## 2015-08-23 NOTE — Telephone Encounter (Signed)
Spoke with patient. Appointment confirmed for 09/25/15. Jake Kennedy.

## 2015-08-24 DIAGNOSIS — H5211 Myopia, right eye: Secondary | ICD-10-CM | POA: Diagnosis not present

## 2015-08-24 DIAGNOSIS — H52213 Irregular astigmatism, bilateral: Secondary | ICD-10-CM | POA: Diagnosis not present

## 2015-08-28 ENCOUNTER — Other Ambulatory Visit: Payer: BLUE CROSS/BLUE SHIELD

## 2015-08-28 ENCOUNTER — Ambulatory Visit: Payer: BLUE CROSS/BLUE SHIELD | Admitting: Hematology and Oncology

## 2015-09-05 ENCOUNTER — Encounter: Payer: Self-pay | Admitting: Sports Medicine

## 2015-09-05 ENCOUNTER — Ambulatory Visit (INDEPENDENT_AMBULATORY_CARE_PROVIDER_SITE_OTHER): Payer: BLUE CROSS/BLUE SHIELD | Admitting: Sports Medicine

## 2015-09-05 VITALS — BP 118/74 | HR 63 | Ht 74.0 in | Wt 180.0 lb

## 2015-09-05 DIAGNOSIS — M25521 Pain in right elbow: Secondary | ICD-10-CM

## 2015-09-05 DIAGNOSIS — M7701 Medial epicondylitis, right elbow: Secondary | ICD-10-CM

## 2015-09-05 NOTE — Progress Notes (Signed)
   Subjective:    Patient ID: Jake Kennedy, male    DOB: Nov 13, 1950, 65 y.o.   MRN: [id number]  HPI   Patient comes in today for follow-up on right elbow medial epicondylitis. Despite exhaustive conservative treatment he is still having intermittent pain particularly with activity such as pushing a swing or casting a fishing rod. His symptoms have been present now for about a year. He has had exhaustive physical therapy, is currently using nitroglycerin patches (which he has been doing for the past 6 months), and wearing a counterforce brace with activity. Despite all of this his symptoms persist. Recent MRI of his elbow showed a partial tear through the common flexor tendon which was consistent with what was seen on ultrasound.    Review of Systems     Objective:   Physical Exam  Well-developed, well-nourished. No acute distress  Right elbow: Full range of motion. No effusion. Mild tenderness over the medial epicondyle as well as a little more medial. No soft tissue swelling. Neurovascularly intact distally.      Assessment & Plan:   Chronic right elbow pain secondary to common flexor tendinopathy/partial tearing  Given his failure to improve with conservative treatment along with the fact that his symptoms have been present now for a year, I would like to refer the patient to Dr. Laurelyn Sickle to discuss possible debridement. The patient is currently on Plavix and it is not clear whether or not he would be able to come off of his Plavix before October. Nonetheless, I would still like for him to meet with Dr. Amedeo Plenty to discuss other options for treatment including surgery. I'll defer further workup and treatment to the discretion of Dr. Amedeo Plenty and the patient will follow-up with me as needed.

## 2015-09-05 NOTE — Patient Instructions (Signed)
Referral to  [facility] [place], [place]  [phone] Dr. Amedeo Plenty

## 2015-09-08 DIAGNOSIS — L821 Other seborrheic keratosis: Secondary | ICD-10-CM | POA: Diagnosis not present

## 2015-09-08 DIAGNOSIS — L570 Actinic keratosis: Secondary | ICD-10-CM | POA: Diagnosis not present

## 2015-09-08 DIAGNOSIS — C44519 Basal cell carcinoma of skin of other part of trunk: Secondary | ICD-10-CM | POA: Diagnosis not present

## 2015-09-08 DIAGNOSIS — D1801 Hemangioma of skin and subcutaneous tissue: Secondary | ICD-10-CM | POA: Diagnosis not present

## 2015-09-08 DIAGNOSIS — Z85828 Personal history of other malignant neoplasm of skin: Secondary | ICD-10-CM | POA: Diagnosis not present

## 2015-09-13 DIAGNOSIS — M7701 Medial epicondylitis, right elbow: Secondary | ICD-10-CM | POA: Diagnosis not present

## 2015-09-13 DIAGNOSIS — M25521 Pain in right elbow: Secondary | ICD-10-CM | POA: Diagnosis not present

## 2015-09-25 ENCOUNTER — Encounter: Payer: Self-pay | Admitting: Hematology and Oncology

## 2015-09-25 ENCOUNTER — Other Ambulatory Visit (HOSPITAL_BASED_OUTPATIENT_CLINIC_OR_DEPARTMENT_OTHER): Payer: BLUE CROSS/BLUE SHIELD

## 2015-09-25 ENCOUNTER — Ambulatory Visit (HOSPITAL_BASED_OUTPATIENT_CLINIC_OR_DEPARTMENT_OTHER): Payer: BLUE CROSS/BLUE SHIELD | Admitting: Hematology and Oncology

## 2015-09-25 ENCOUNTER — Other Ambulatory Visit (HOSPITAL_COMMUNITY)
Admission: RE | Admit: 2015-09-25 | Discharge: 2015-09-25 | Disposition: A | Discharge status: Home / Self Care | Payer: BLUE CROSS/BLUE SHIELD | Source: Ambulatory Visit | Attending: Hematology and Oncology | Admitting: Hematology and Oncology

## 2015-09-25 DIAGNOSIS — C9110 Chronic lymphocytic leukemia of B-cell type not having achieved remission: Secondary | ICD-10-CM

## 2015-09-25 LAB — CBC WITH DIFFERENTIAL/PLATELET
BASO%: 0.7 % (ref 0.0–2.0)
Basophils Absolute: 0.1 10*3/uL (ref 0.0–0.1)
EOS ABS: 0.1 10*3/uL (ref 0.0–0.5)
EOS%: 0.4 % (ref 0.0–7.0)
HCT: 46.7 % (ref 38.4–49.9)
HGB: 15.1 g/dL (ref 13.0–17.1)
LYMPH%: 61.4 % — AB (ref 14.0–49.0)
MCH: 29.9 pg (ref 27.2–33.4)
MCHC: 32.2 g/dL (ref 32.0–36.0)
MCV: 92.9 fL (ref 79.3–98.0)
MONO#: 0.8 10*3/uL (ref 0.1–0.9)
MONO%: 5.2 % (ref 0.0–14.0)
NEUT%: 32.3 % — ABNORMAL LOW (ref 39.0–75.0)
NEUTROS ABS: 4.8 10*3/uL (ref 1.5–6.5)
Platelets: 172 10*3/uL (ref 140–400)
RBC: 5.03 10*6/uL (ref 4.20–5.82)
RDW: 14.3 % (ref 11.0–14.6)
WBC: 14.9 10*3/uL — AB (ref 4.0–10.3)
lymph#: 9.1 10*3/uL — ABNORMAL HIGH (ref 0.9–3.3)

## 2015-09-25 LAB — TECHNOLOGIST REVIEW

## 2015-09-25 NOTE — Assessment & Plan Note (Signed)
CLL stage 0: Flow cytometry revealed a monoclonal B-cell population, lambda restricted expressing pan B cell antigens including CD20 with associated CD5 expression. Differential diagnosis CLL versus Mantle cell lymphoma FISH panel for CLL prognostic markers: trisomy 12 ( intermediate prognosis), no evidence of chromosome 13 mutation, p53, ATM deletions   Recommendation: Translocation 11:14 will be requested when he comes back to see [place] in 6 months to rule out mantle cell lymphoma. Since his disease course is extremely indolent, I did not ask him to get additional blood work today.  Previously I discussed with him about pathophysiology, staging, prognosis and indications for treatment of CLL.  Treatment plan: Observation  Return to clinic in 1 year for follow-up.

## 2015-09-25 NOTE — Progress Notes (Addendum)
Patient Care Team: [name], MD as PCP - General (Internal Medicine)  DIAGNOSIS: CLL stage 0 SUMMARY OF ONCOLOGIC HISTORY:   Chronic lymphocytic leukemia (CLL), B-cell ([place])   02/14/2015 Initial Diagnosis    Monoclonal B-cell population CD5 and CD20 positive, differential diagnosis CLL versus mantle cell lymphoma ( patient had absolute lymphocyte count of 8000 in 2010), remained stable.       CHIEF COMPLIANT: Follow-up of CLL  INTERVAL HISTORY: Jake Kennedy is a 65 year old with above-mentioned history of B-cell CLL who is here for his 7 month follow-up. He reports no new problems or concerns. He denies any fevers chills, night sweats, weight loss or any lymphadenopathy. He is staying fairly active and exercises regularly. Denies any fatigue.  REVIEW OF SYSTEMS:   Constitutional: Denies fevers, chills or abnormal weight loss Eyes: Denies blurriness of vision Ears, nose, mouth, throat, and face: Denies mucositis or sore throat Respiratory: Denies cough, dyspnea or wheezes Cardiovascular: Denies palpitation, chest discomfort Gastrointestinal:  Denies nausea, heartburn or change in bowel habits Skin: Denies abnormal skin rashes Lymphatics: Denies new lymphadenopathy or easy bruising Neurological:Denies numbness, tingling or new weaknesses Behavioral/Psych: Mood is stable, no new changes  Extremities: No lower extremity edema Breast:  denies any pain or lumps or nodules in either breasts All other systems were reviewed with the patient and are negative.  I have reviewed the past medical history, past surgical history, social history and family history with the patient and they are unchanged from previous note.  ALLERGIES:  is allergic to latex.  MEDICATIONS:  Current Outpatient Prescriptions  Medication Sig Dispense Refill  . aspirin 81 MG tablet Take 81 mg by mouth daily.    Marland Kitchen atorvastatin (LIPITOR) 80 MG tablet Take 1 tablet (80 mg total) by mouth daily at 6 PM. 90  tablet 3  . beta carotene w/minerals (OCUVITE) tablet Take 1 tablet by mouth daily.    . clopidogrel (PLAVIX) 75 MG tablet Take 1 tablet (75 mg total) by mouth daily with breakfast. 90 tablet 3  . metoprolol tartrate (LOPRESSOR) 25 MG tablet     . Multiple Vitamin (MULTIVITAMIN) tablet Take 1 tablet by mouth daily.    . nitroGLYCERIN (NITRODUR - DOSED IN MG/24 HR) 0.2 mg/hr patch Place 0.05 mg onto the skin daily. Place 1/4 patch to affected area daily    . nitroGLYCERIN (NITROSTAT) 0.4 MG SL tablet Place 1 tablet (0.4 mg total) under the tongue every 5 (five) minutes as needed for chest pain. 25 tablet 3  . Omega-3 Fatty Acids (FISH OIL CONCENTRATE PO) Take 1 capsule by mouth daily.      No current facility-administered medications for this visit.     PHYSICAL EXAMINATION: ECOG PERFORMANCE STATUS: 0 - Asymptomatic  Vitals:   09/25/15 1131  BP: 125/86  Pulse: 64  Resp: 18  Temp: 98.4 F (36.9 C)   Filed Weights   09/25/15 1131  Weight: 178 lb 8 oz (81 kg)    GENERAL:alert, no distress and comfortable SKIN: skin color, texture, turgor are normal, no rashes or significant lesions EYES: normal, Conjunctiva are pink and non-injected, sclera clear OROPHARYNX:no exudate, no erythema and lips, buccal mucosa, and tongue normal  NECK: supple, thyroid normal size, non-tender, without nodularity LYMPH:  no palpable lymphadenopathy in the cervical, axillary or inguinal LUNGS: clear to auscultation and percussion with normal breathing effort HEART: regular rate & rhythm and no murmurs and no lower extremity edema ABDOMEN:abdomen soft, non-tender and normal bowel sounds, No hepatosplenomegaly  MUSCULOSKELETAL:no cyanosis of digits and no clubbing  NEURO: alert & oriented x 3 with fluent speech, no focal motor/sensory deficits EXTREMITIES: No lower extremity edema  LABORATORY DATA:  I have reviewed the data as listed   Chemistry      Component Value Date/Time   NA 141 02/28/2015 1444    K 4.5 02/28/2015 1444   CL 107 02/11/2015 0305   CO2 29 02/28/2015 1444   BUN 16.1 02/28/2015 1444   CREATININE 1.0 02/28/2015 1444      Component Value Date/Time   CALCIUM 8.9 02/28/2015 1444   ALKPHOS 61 04/05/2015 0757   ALKPHOS 62 02/28/2015 1444   AST 24 04/05/2015 0757   AST 19 02/28/2015 1444   ALT 42 04/05/2015 0757   ALT 27 02/28/2015 1444   BILITOT 0.7 04/05/2015 0757   BILITOT 0.66 02/28/2015 1444       Lab Results  Component Value Date   WBC 14.9 (H) 09/25/2015   HGB 15.1 09/25/2015   HCT 46.7 09/25/2015   MCV 92.9 09/25/2015   PLT 172 09/25/2015   NEUTROABS 4.8 09/25/2015     ASSESSMENT & PLAN:  Chronic lymphocytic leukemia (CLL), B-cell ([facility]) CLL stage 0: Flow cytometry revealed a monoclonal B-cell population, lambda restricted expressing pan B cell antigens including CD20 with associated CD5 expression. Differential diagnosis CLL versus Mantle cell lymphoma FISH panel for CLL prognostic markers: trisomy 12 ( intermediate prognosis), no evidence of chromosome 13 mutation, p53, ATM deletions   Recommendation: Translocation 11:14 will be requested when he comes back to see [place] in 6 months to rule out mantle cell lymphoma. Since his disease course is extremely indolent, I did not ask him to get additional blood work today.  Previously I discussed with him about pathophysiology, staging, prognosis and indications for treatment of CLL.  Treatment plan: Observation  Return to clinic in 1 year for follow-up.   No orders of the defined types were placed in this encounter.  The patient has a good understanding of the overall plan. he agrees with it. he will call with any problems that may develop before the next visit here.   Rulon Eisenmenger, MD 09/25/15  Addendum: After discussing the follow-up plan with the patient, he wanted to follow-up with Dr. Laurann Montana his primary care physician once a year with blood work. If there is a change in his blood counts or  his symptoms, he will call and be seen by [place] in the oncology clinic. The following laparotomy for would warrant an immediate reconsultation 1. Rapid doubling of lymphocyte count less than 6 months 2. anemia less than 10 g hemoglobin 3. Thrombocytopenia less than 100,000 platelets 4. B symptoms of fevers chills, night sweats, weight loss, lymphadenopathy that is palpable and painful I request Dr. Laurann Montana to obtain a CBC with differential annually so that this can be monitored. LDH is a tumor marker that would also be helpful if they are able to obtain once a year.

## 2015-09-26 ENCOUNTER — Emergency Department (HOSPITAL_COMMUNITY)
Admit: 2015-09-26 | Discharge: 2015-09-26 | Disposition: A | Discharge status: Home / Self Care | Payer: BLUE CROSS/BLUE SHIELD | Attending: Dermatology | Admitting: Dermatology

## 2015-09-26 ENCOUNTER — Encounter (HOSPITAL_COMMUNITY): Payer: Self-pay | Admitting: Emergency Medicine

## 2015-09-26 DIAGNOSIS — Z5321 Procedure and treatment not carried out due to patient leaving prior to being seen by health care provider: Secondary | ICD-10-CM | POA: Insufficient documentation

## 2015-09-26 DIAGNOSIS — Y829 Unspecified medical devices associated with adverse incidents: Secondary | ICD-10-CM | POA: Diagnosis not present

## 2015-09-26 DIAGNOSIS — C44519 Basal cell carcinoma of skin of other part of trunk: Secondary | ICD-10-CM | POA: Diagnosis not present

## 2015-09-26 DIAGNOSIS — L905 Scar conditions and fibrosis of skin: Secondary | ICD-10-CM | POA: Diagnosis not present

## 2015-09-26 DIAGNOSIS — T8131XA Disruption of external operation (surgical) wound, not elsewhere classified, initial encounter: Secondary | ICD-10-CM | POA: Diagnosis present

## 2015-09-26 NOTE — ED Notes (Signed)
Patient called this Probation officer into room. Spoke with a Dr. Ubaldo Glassing with [facility], who states she is going to meet patient at the office now to see patient for wound dehiscence. Patient is going to leave, A&O x4, in NAD, escorted by wife.

## 2015-09-26 NOTE — ED Triage Notes (Signed)
Patient presents for wound dehiscence. Had mole removed from right shoulder. Reports he was sitting in recliner and felt blood dripping down arm. Plavix therapy. Denies pain, lightheadedness, dizziness. A&O x4.

## 2015-09-26 NOTE — ED Notes (Signed)
Wound reinforced with tefla, a pack of 4X4's and clean dressing. Has not bled through dressing at this time.

## 2015-09-27 ENCOUNTER — Emergency Department (HOSPITAL_COMMUNITY)
Admission: EM | Admit: 2015-09-27 | Discharge: 2015-09-27 | Disposition: A | Discharge status: Home / Self Care | Payer: BLUE CROSS/BLUE SHIELD | Attending: Emergency Medicine | Admitting: Emergency Medicine

## 2015-09-27 ENCOUNTER — Encounter (HOSPITAL_COMMUNITY): Payer: Self-pay

## 2015-09-27 DIAGNOSIS — Z79899 Other long term (current) drug therapy: Secondary | ICD-10-CM | POA: Insufficient documentation

## 2015-09-27 DIAGNOSIS — Z91040 Latex allergy status: Secondary | ICD-10-CM | POA: Insufficient documentation

## 2015-09-27 DIAGNOSIS — Z85828 Personal history of other malignant neoplasm of skin: Secondary | ICD-10-CM | POA: Insufficient documentation

## 2015-09-27 DIAGNOSIS — L7622 Postprocedural hemorrhage and hematoma of skin and subcutaneous tissue following other procedure: Secondary | ICD-10-CM | POA: Insufficient documentation

## 2015-09-27 DIAGNOSIS — I2510 Atherosclerotic heart disease of native coronary artery without angina pectoris: Secondary | ICD-10-CM | POA: Insufficient documentation

## 2015-09-27 DIAGNOSIS — Z955 Presence of coronary angioplasty implant and graft: Secondary | ICD-10-CM | POA: Diagnosis not present

## 2015-09-27 DIAGNOSIS — Z7982 Long term (current) use of aspirin: Secondary | ICD-10-CM | POA: Diagnosis not present

## 2015-09-27 DIAGNOSIS — Z856 Personal history of leukemia: Secondary | ICD-10-CM | POA: Diagnosis not present

## 2015-09-27 DIAGNOSIS — S40011A Contusion of right shoulder, initial encounter: Secondary | ICD-10-CM | POA: Diagnosis not present

## 2015-09-27 DIAGNOSIS — Z48817 Encounter for surgical aftercare following surgery on the skin and subcutaneous tissue: Secondary | ICD-10-CM | POA: Diagnosis present

## 2015-09-27 DIAGNOSIS — T148XXA Other injury of unspecified body region, initial encounter: Secondary | ICD-10-CM

## 2015-09-27 NOTE — ED Notes (Signed)
Wound dressed with Xeroform and gauze

## 2015-09-27 NOTE — ED Notes (Signed)
Bed: WA07 Expected date:  Expected time:  Means of arrival:  Comments: TCI-wound bleeding

## 2015-09-27 NOTE — ED Triage Notes (Signed)
Pt had an excision done at dermatologist office today. Pt noticed bleeding earlier this evening. Pt has hx of cardiac stent and dermatologist reports that pt hematoma continues to expand. Pt A&Ox4 and ambulatory.

## 2015-09-27 NOTE — ED Provider Notes (Signed)
[facility] DEPT Provider Note   CSN: [id number] Arrival date & time: 09/27/15  0019  By signing my name below, I, Jake Kennedy, attest that this documentation has been prepared under the direction and in the presence of Everlene Balls, MD. Electronically Signed: Judithann Kennedy, ED Scribe. 09/27/15. 1:12 AM.    History   Chief Complaint Chief Complaint  Patient presents with  . Post-op Problem    HPI Comments: Jake Kennedy is a 65 y.o. male with a hx of who presents to the Emergency Department for evaluation of bleeding from surgical site to his right posterior shoulder s/p excision that was done at a dermatologist office at 2 pm yesterday. He explains that he noticed the bleeding at 7 pm yesterday. He states that he has applied pressure to the area for the last 2 hours which has controlled the bleeding. No medications PTA. He reports that he is currently on Plavix. No fever, chills, generalized rash, lightheadedness, or any dizziness.   The history is provided by the patient. No language interpreter was used.    Past Medical History:  Diagnosis Date  . Basal cell carcinoma    moles removed in 2007 and 2012 from L nose and L hip  . BPH (benign prostatic hypertrophy)   . CAD (coronary artery disease), native coronary artery 02/11/2015   cath 02/10/2015 95 eccentric mid LAD stenosis treated with a 3.0 x 16 mm Promus DES, 30% mid LAD lesion, 30% D1 stenosis  . Coronary artery disease   . ED (erectile dysfunction)   . Lymphocytosis 02/11/2015   seen by Dr. Lindi Adie 02/11/2015, working up for possible CLL    Patient Active Problem List   Diagnosis Date Noted  . Right elbow pain 03/07/2015  . Chronic lymphocytic leukemia (CLL), B-cell ([place]) 02/28/2015  . CAD (coronary artery disease), native coronary artery 02/11/2015  . Abnormal stress test 02/11/2015  . Abnormal WBC count   . Exertional angina ([place]) 02/10/2015    Past Surgical History:  Procedure Laterality Date  . CARDIAC  CATHETERIZATION  02/10/2015  . CARDIAC CATHETERIZATION N/A 02/10/2015   Procedure: Left Heart Cath and Coronary Angiography;  Surgeon: Sherren Mocha, MD;  Location: [facility];  Service: Cardiovascular;  Laterality: N/A;  . CARDIAC CATHETERIZATION N/A 02/10/2015   Procedure: Coronary Stent Intervention;  Surgeon: Sherren Mocha, MD;  Location: [facility];  Service: Cardiovascular;  Laterality: N/A;  . CORONARY STENT PLACEMENT  02/10/2015   DES to LAD  . LAMINECTOMY    . TONSILLECTOMY         Home Medications    Prior to Admission medications   Medication Sig Start Date End Date Taking? Authorizing Provider  aspirin 81 MG tablet Take 81 mg by mouth daily.   Yes Historical Provider, MD  atorvastatin (LIPITOR) 80 MG tablet Take 1 tablet (80 mg total) by mouth daily at 6 PM. 06/19/15  Yes Sherren Mocha, MD  beta carotene w/minerals (OCUVITE) tablet Take 1 tablet by mouth daily.   Yes Historical Provider, MD  clopidogrel (PLAVIX) 75 MG tablet Take 1 tablet (75 mg total) by mouth daily with breakfast. 06/19/15  Yes Sherren Mocha, MD  Multiple Vitamin (MULTIVITAMIN) tablet Take 1 tablet by mouth daily.   Yes Historical Provider, MD  nitroGLYCERIN (NITRODUR - DOSED IN MG/24 HR) 0.2 mg/hr patch Place 0.05 mg onto the skin daily. Place 1/4 patch to affected area daily   Yes Historical Provider, MD  nitroGLYCERIN (NITROSTAT) 0.4 MG SL tablet Place  1 tablet (0.4 mg total) under the tongue every 5 (five) minutes as needed for chest pain. 06/19/15  Yes Sherren Mocha, MD  Omega-3 Fatty Acids (FISH OIL CONCENTRATE PO) Take 1 capsule by mouth daily.    Yes Historical Provider, MD    Family History Family History  Problem Relation Age of Onset  . Diabetes Mother   . Heart failure Mother   . Heart attack Mother     occured in 30s.   . Healthy Sister   . Healthy Brother   . Healthy Maternal Grandmother   . Healthy Maternal Grandfather   . Healthy Paternal Grandmother   . Healthy  Paternal Grandfather   . Healthy Brother     Social History Social History  Substance Use Topics  . Smoking status: Never Smoker  . Smokeless tobacco: Never Used  . Alcohol use 0.0 oz/week     Comment: occasional     Allergies   Latex   Review of Systems Review of Systems  Constitutional: Negative for chills and fever.  Skin: Positive for wound. Negative for rash.  Neurological: Negative for dizziness and light-headedness.  All other systems reviewed and are negative.    Physical Exam Updated Vital Signs BP 146/100   Pulse (!) 59   Resp 16   SpO2 100%   Physical Exam  Constitutional: He is oriented to person, place, and time. Vital signs are normal. He appears well-developed and well-nourished.  Non-toxic appearance. He does not appear ill. No distress.  HENT:  Head: Normocephalic and atraumatic.  Nose: Nose normal.  Mouth/Throat: Oropharynx is clear and moist. No oropharyngeal exudate.  Eyes: Conjunctivae and EOM are normal. Pupils are equal, round, and reactive to light. No scleral icterus.  Neck: Normal range of motion. Neck supple. No tracheal deviation, no edema, no erythema and normal range of motion present. No thyroid mass and no thyromegaly present.  Cardiovascular: Normal rate, regular rhythm, S1 normal, S2 normal, normal heart sounds, intact distal pulses and normal pulses.  Exam reveals no gallop and no friction rub.   No murmur heard. Pulmonary/Chest: Effort normal and breath sounds normal. No respiratory distress. He has no wheezes. He has no rhonchi. He has no rales.  Abdominal: Soft. Normal appearance and bowel sounds are normal. He exhibits no distension, no ascites and no mass. There is no hepatosplenomegaly. There is no tenderness. There is no rebound, no guarding and no CVA tenderness.  Musculoskeletal: Normal range of motion. He exhibits no edema or tenderness.  Lymphadenopathy:    He has no cervical adenopathy.  Neurological: He is alert and  oriented to person, place, and time. He has normal strength. No cranial nerve deficit or sensory deficit.  Skin: Skin is warm, dry and intact. No petechiae and no rash noted. He is not diaphoretic. No erythema. No pallor.  4 cm linear excision over his right posterior shoulder, 2 stitches remain in place, there is a soft tissue hematoma. Not currently expanding.   Nursing note and vitals reviewed.    ED Treatments / Results  DIAGNOSTIC STUDIES: Oxygen Saturation is 100% on RA, normal by my interpretation.    COORDINATION OF CARE: 1:06 AM- Pt advised of plan for treatment and pt agrees. Pt will be monitored to determine if CT scan is appropriate to determine if hematoma is expanding.    Labs (all labs ordered are listed, but only abnormal results are displayed) Labs Reviewed - No data to display  EKG  EKG Interpretation None  Radiology No results found.  Procedures Procedures (including critical care time)  Medications Ordered in ED Medications - No data to display   Initial Impression / Assessment and Plan / ED Course  Everlene Balls, MD has reviewed the triage vital signs and the nursing notes.  Pertinent labs & imaging results that were available during my care of the patient were reviewed by me and considered in my medical decision making (see chart for details).  Clinical Course    Patient presents to the ED for expanding hematoma on his back after surgical procedure.  He is on ASA and plavix.  2:01 AM He was observed for 1hr and the wound has still not expanded. Ibelieve the bleeding as stopped.  I apply dermabond and and steri strips.  Will cover with xeroform and gauze for protection. Wound care home advice given.  He appears well and in NAD. Vs remain within his normal limits and he is safe for DC.  Final Clinical Impressions(s) / ED Diagnoses   Final diagnoses:  None    New Prescriptions New Prescriptions   No medications on file      I  personally performed the services described in this documentation, which was scribed in my presence. The recorded information has been reviewed and is accurate.      Everlene Balls, MD 09/27/15 [phone]

## 2015-09-28 ENCOUNTER — Telehealth: Payer: Self-pay | Admitting: Cardiovascular Disease

## 2015-09-28 LAB — TISSUE HYBRIDIZATION TO NCBH

## 2015-09-28 NOTE — Telephone Encounter (Signed)
Called patient back with Dr. Antionette Char advise. Per Dr. Burt Knack, patient can hold plavix for 2-3 days, but he should stay on his aspirin. Patient verbalized understanding and thanked the Dr. Burt Knack for a quick response.

## 2015-09-28 NOTE — Telephone Encounter (Signed)
Patient went to the Dermatologist on Tuesday afternoon, at that time the Dermatologist removed a mole off of patient's right posterior shoulder. Patient later in the evening had bleeding from the site and had a hematoma that was reported to be expanding. Patient had reinforcement applied (dermabond and steri strips) in ED early Wednesday morning. Patient stated he went back to the dermatologist yesterday, and they removed dermabond and steri strips, and replaced with stitches. Patient stated he has not noticed any bleeding for the last 24 hours. Patient has been on Plavix and ASA, and did not hold medications at any time during this whole process.  Patient wants to know, if it is safe to continue his Plavix and ASA, or does he need to stop for a couple of days until his wound heals more? Will forward to Dr. Burt Knack.

## 2015-09-28 NOTE — Telephone Encounter (Signed)
He can hold plavix for 2-3 days but should stay on ASA. PCI was > 6 months but less than one year ago.

## 2015-09-28 NOTE — Telephone Encounter (Signed)
Jake Kennedy is calling in reference to some issues that came up when he had dermatology surgery while on Plavix and he has some questions . Please call

## 2015-10-03 ENCOUNTER — Telehealth: Payer: Self-pay | Admitting: Cardiovascular Disease

## 2015-10-03 ENCOUNTER — Telehealth: Payer: Self-pay | Admitting: Interventional Cardiology

## 2015-10-03 NOTE — Telephone Encounter (Signed)
New Message:   Please call,question about his Plavix.

## 2015-10-03 NOTE — Telephone Encounter (Signed)
Do not need this encounter °

## 2015-10-03 NOTE — Telephone Encounter (Signed)
I spoke with the pt and his last dosage of Plavix was last Wednesday.  The pt said he does have a hematoma about the size of a baseball which has remained stable in size.  The pt saw his dermatologist yesterday and they advised the pt to touch base with our office about further instruction in regards to restarting plavix.  Per notes from Dr Burt Knack he had advised that the pt remain off this medication 2-3 days and continue ASA. I instructed the pt to restart plavix at this time and monitor hematoma.  If he has any signs of bleeding or increased size of hematoma then he will apply pressure and contact our office for further instruction in regards to his plavix.  Pt agreed with plan.

## 2015-10-04 NOTE — Telephone Encounter (Signed)
agree

## 2015-10-05 NOTE — Telephone Encounter (Signed)
I spoke with the pt to check on how he is doing since he restarted plavix.  The pt's said he is doing fine and that his wife has been keeping an eye on his hematoma and she felt it was a little smaller today.  The pt will contact the office if he has any further questions or concerns.

## 2015-10-12 DIAGNOSIS — Z23 Encounter for immunization: Secondary | ICD-10-CM | POA: Diagnosis not present

## 2015-10-12 DIAGNOSIS — T148 Other injury of unspecified body region: Secondary | ICD-10-CM | POA: Diagnosis not present

## 2015-10-17 ENCOUNTER — Telehealth: Payer: Self-pay

## 2015-10-17 NOTE — Telephone Encounter (Signed)
Called pt to let him know results from cytogenetics were negative as requested by Dr. Lindi Adie.  Pt without further questions or concerns at time of call.

## 2015-11-03 ENCOUNTER — Other Ambulatory Visit: Payer: Self-pay | Admitting: *Deleted

## 2015-11-03 ENCOUNTER — Encounter: Payer: Self-pay | Admitting: Sports Medicine

## 2015-11-03 ENCOUNTER — Ambulatory Visit (INDEPENDENT_AMBULATORY_CARE_PROVIDER_SITE_OTHER): Payer: BLUE CROSS/BLUE SHIELD | Admitting: Sports Medicine

## 2015-11-03 VITALS — BP 126/77 | HR 53 | Ht 74.0 in | Wt 180.0 lb

## 2015-11-03 DIAGNOSIS — M7701 Medial epicondylitis, right elbow: Secondary | ICD-10-CM | POA: Diagnosis not present

## 2015-11-03 NOTE — Progress Notes (Signed)
   Subjective:    Patient ID: Jake Kennedy, male    DOB: [birth year], 65 y.o.   MRN: [id number]  HPI   Patient comes in today to follow-up on chronic right elbow medial epicondylitis. During his last office visit on August 1, he had made a referral to [facility]. He met with Dr.Gramig who recommended proceeding with conservative treatment. Over the past several weeks, the pain that he was experiencing over the medial epicondyle has improved dramatically. He has been able to slowly increase his activity without returning pain. His main complaint is persistent swelling and mild discomfort just distal to the medial epicondyle in the area of the pronator teres muscle group. This was also an area of concern for him initially in his treatment for medial epicondylitis. He does have a full length elbow compression sleeve that he wears with activity but it does not seem to help. Main reason for today's visit is to see whether or not I think that this area of swelling and discomfort is related to his medial epicondylitis. He is concerned that his pain will start to return to the level that it was at before. He has discontinued his topical nitroglycerin patches and is wondering whether he should restart those and he is also asking about the possibility of PRP.    Review of Systems     Objective:   Physical Exam  Well-developed, well-nourished. No acute distress. Vital signs reviewed  Right elbow: Full range of motion. No effusion. No obvious soft tissue swelling. There is no tenderness to palpation today over the medial epicondyle. No reproducible pain with resisted wrist flexion and ulnar deviation. He points to the pronator teres as his area of concern and states that it is most noticeable to him with pronation of the forearm. There is no palpable tenderness here. No palpable defect. Neurovascularly he is intact distally.        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 His medial epicondylitis is definitely improving. I would not recommend repeating nitroglycerin and I certainly would not recommend proceeding with PRP at this point in time. I think his area of concern correlates with a possible pronator teres muscle hernia through the bicipital aponeurosis. This has not been appreciated on previous imaging including ultrasound and MRI but clinically I definitely think he has some sort of involvement of his pronator teres. He will return to physical therapy briefly to see if there are any additional exercises or treatment that they would like to do for this area. I've reassured him that this should not interfere with his ability to continue to increase his activity over the next several weeks. We've also recommended a different type of body helix compression sleeve which is a smaller sleeve that compresses the area of concern and not the total elbow. He will wear this with activity. Follow-up with me as needed.

## 2015-11-03 NOTE — Patient Instructions (Signed)
  I think the muscle and near forearm that keeps swelling is her pronator teres. You may have a tear through the bicipital aponeurosis (fascia) through which the pronator teres will bolds from time to time. You may see if physical therapy has any suggestions for this. It certainly should not be a source of pain that keeps you from doing what you want to do.

## 2015-11-13 DIAGNOSIS — M7701 Medial epicondylitis, right elbow: Secondary | ICD-10-CM | POA: Diagnosis not present

## 2015-11-13 DIAGNOSIS — M25521 Pain in right elbow: Secondary | ICD-10-CM | POA: Diagnosis not present

## 2015-11-23 LAB — FISH, PERIPHERAL BLOOD

## 2015-11-29 ENCOUNTER — Telehealth: Payer: Self-pay | Admitting: *Deleted

## 2015-11-29 NOTE — Telephone Encounter (Signed)
VM left by pt, returned call to pt. Advised I am unable to locate a scan from August taken at [facility] to access care everywhere. Recommended pt call [facility] for results as this is where pt has advised scan was performed.

## 2016-02-13 DIAGNOSIS — C44612 Basal cell carcinoma of skin of right upper limb, including shoulder: Secondary | ICD-10-CM | POA: Diagnosis not present

## 2016-02-13 DIAGNOSIS — L820 Inflamed seborrheic keratosis: Secondary | ICD-10-CM | POA: Diagnosis not present

## 2016-02-13 DIAGNOSIS — C44519 Basal cell carcinoma of skin of other part of trunk: Secondary | ICD-10-CM | POA: Diagnosis not present

## 2016-02-13 DIAGNOSIS — L910 Hypertrophic scar: Secondary | ICD-10-CM | POA: Diagnosis not present

## 2016-02-13 DIAGNOSIS — L570 Actinic keratosis: Secondary | ICD-10-CM | POA: Diagnosis not present

## 2016-02-28 DIAGNOSIS — Z0000 Encounter for general adult medical examination without abnormal findings: Secondary | ICD-10-CM | POA: Diagnosis not present

## 2016-02-28 DIAGNOSIS — Z23 Encounter for immunization: Secondary | ICD-10-CM | POA: Diagnosis not present

## 2016-02-28 DIAGNOSIS — N400 Enlarged prostate without lower urinary tract symptoms: Secondary | ICD-10-CM | POA: Diagnosis not present

## 2016-02-28 DIAGNOSIS — Z125 Encounter for screening for malignant neoplasm of prostate: Secondary | ICD-10-CM | POA: Diagnosis not present

## 2016-02-28 DIAGNOSIS — N529 Male erectile dysfunction, unspecified: Secondary | ICD-10-CM | POA: Diagnosis not present

## 2016-02-28 DIAGNOSIS — I2510 Atherosclerotic heart disease of native coronary artery without angina pectoris: Secondary | ICD-10-CM | POA: Diagnosis not present

## 2016-02-28 DIAGNOSIS — C9110 Chronic lymphocytic leukemia of B-cell type not having achieved remission: Secondary | ICD-10-CM | POA: Diagnosis not present

## 2016-03-14 DIAGNOSIS — H5213 Myopia, bilateral: Secondary | ICD-10-CM | POA: Diagnosis not present

## 2016-03-22 DIAGNOSIS — M25511 Pain in right shoulder: Secondary | ICD-10-CM | POA: Diagnosis not present

## 2016-03-22 DIAGNOSIS — M6281 Muscle weakness (generalized): Secondary | ICD-10-CM | POA: Diagnosis not present

## 2016-03-22 DIAGNOSIS — M6240 Contracture of muscle, unspecified site: Secondary | ICD-10-CM | POA: Diagnosis not present

## 2016-03-22 DIAGNOSIS — R293 Abnormal posture: Secondary | ICD-10-CM | POA: Diagnosis not present

## 2016-03-28 ENCOUNTER — Other Ambulatory Visit: Payer: Self-pay | Admitting: Gastroenterology

## 2016-03-28 DIAGNOSIS — R293 Abnormal posture: Secondary | ICD-10-CM | POA: Diagnosis not present

## 2016-03-28 DIAGNOSIS — M6240 Contracture of muscle, unspecified site: Secondary | ICD-10-CM | POA: Diagnosis not present

## 2016-03-28 DIAGNOSIS — M25511 Pain in right shoulder: Secondary | ICD-10-CM | POA: Diagnosis not present

## 2016-03-28 DIAGNOSIS — M6281 Muscle weakness (generalized): Secondary | ICD-10-CM | POA: Diagnosis not present

## 2016-04-04 DIAGNOSIS — M25511 Pain in right shoulder: Secondary | ICD-10-CM | POA: Diagnosis not present

## 2016-04-04 DIAGNOSIS — M7541 Impingement syndrome of right shoulder: Secondary | ICD-10-CM | POA: Diagnosis not present

## 2016-06-18 ENCOUNTER — Ambulatory Visit (HOSPITAL_COMMUNITY)
Admission: RE | Admit: 2016-06-18 | Payer: BLUE CROSS/BLUE SHIELD | Source: Ambulatory Visit | Admitting: Gastroenterology

## 2016-06-18 ENCOUNTER — Encounter (HOSPITAL_COMMUNITY): Admission: RE | Payer: Self-pay | Source: Ambulatory Visit

## 2016-06-18 SURGERY — COLONOSCOPY WITH PROPOFOL
Anesthesia: Monitor Anesthesia Care

## 2016-06-24 DIAGNOSIS — T148XXA Other injury of unspecified body region, initial encounter: Secondary | ICD-10-CM | POA: Diagnosis not present

## 2016-06-24 DIAGNOSIS — R1031 Right lower quadrant pain: Secondary | ICD-10-CM | POA: Diagnosis not present

## 2016-06-27 DIAGNOSIS — L910 Hypertrophic scar: Secondary | ICD-10-CM | POA: Diagnosis not present

## 2016-06-27 DIAGNOSIS — C44519 Basal cell carcinoma of skin of other part of trunk: Secondary | ICD-10-CM | POA: Diagnosis not present

## 2016-06-27 DIAGNOSIS — L820 Inflamed seborrheic keratosis: Secondary | ICD-10-CM | POA: Diagnosis not present

## 2016-06-27 DIAGNOSIS — L570 Actinic keratosis: Secondary | ICD-10-CM | POA: Diagnosis not present

## 2016-07-03 ENCOUNTER — Encounter: Payer: Self-pay | Admitting: Cardiovascular Disease

## 2016-07-08 ENCOUNTER — Telehealth: Payer: Self-pay | Admitting: Cardiovascular Disease

## 2016-07-08 NOTE — Telephone Encounter (Signed)
New message     Pt is calling because he said when he exercts himself or is exercising he has a small twinge in his chest. He said it goes away when he quits and it's been going on for the last couple days. Pt states that he has no other symptoms.

## 2016-07-08 NOTE — Telephone Encounter (Signed)
Pt states that when he is exercising or over exerting himself, he has a twinge feeling in his chest which causes him to not finish his work out. He states that is the only feeling he is having and denies symptoms of CP, SOB, left arm tingling, and blurry vision.  He is only taking the atorvastatin and Aspirin. He is not taking Plavix because he stated he only took it for 1 year after his cath back in January of 2017. So he stopped taking the Plavix in January of 2018. He did not take any NTG for his twinge feeling in his chest. He says its not anything to go to the ER for. Pt says before his cath in 2017 he was feeling more aggressive twinges in his chest prior to the cath. He had a stent placed to Mid LAD-2 lesion in Jan 2017. Pt states the twinges started coming back 2 days ago during exercise but they are not as aggressive as what he felt in his chest before his stent. Advised pt to relax and not to overexert himself until his doctor is aware of his symptoms. Advised pt to keep appt with Dr. Burt Knack on 07/24/2016 with Dr. Burt Knack. Will route to Dr. Burt Knack for further recommendation.

## 2016-07-09 NOTE — Telephone Encounter (Signed)
I spoke with the pt and he called the office to find out if Dr Burt Knack had any thoughts about his symptoms. The pt is scheduled to go out of town next week and he is anxious about his symptoms.  I have scheduled the pt to see Dr Burt Knack on 07/11/16.

## 2016-07-09 NOTE — Telephone Encounter (Signed)
Follow up    Pt is calling back about this today. He would like to speak with RN.

## 2016-07-11 ENCOUNTER — Ambulatory Visit (INDEPENDENT_AMBULATORY_CARE_PROVIDER_SITE_OTHER): Payer: BLUE CROSS/BLUE SHIELD | Admitting: Cardiovascular Disease

## 2016-07-11 ENCOUNTER — Encounter: Payer: Self-pay | Admitting: Cardiovascular Disease

## 2016-07-11 VITALS — BP 118/78 | HR 52 | Ht 74.0 in | Wt 176.8 lb

## 2016-07-11 DIAGNOSIS — R079 Chest pain, unspecified: Secondary | ICD-10-CM | POA: Diagnosis not present

## 2016-07-11 DIAGNOSIS — I208 Other forms of angina pectoris: Secondary | ICD-10-CM

## 2016-07-11 MED ORDER — NITROGLYCERIN 0.4 MG SL SUBL: 0.4000 mg | SUBLINGUAL_TABLET | SUBLINGUAL | 3 refills | Status: DC | PRN

## 2016-07-11 MED ORDER — CLOPIDOGREL BISULFATE 75 MG PO TABS: 75.0000 mg | ORAL_TABLET | Freq: Every day | ORAL | 3 refills | Status: DC

## 2016-07-11 MED ORDER — ATORVASTATIN CALCIUM 80 MG PO TABS: 80.0000 mg | ORAL_TABLET | Freq: Every day | ORAL | 3 refills | Status: DC

## 2016-07-11 NOTE — Progress Notes (Signed)
Cardiology Office Note Date:  07/12/2016   ID:  Jake Kennedy, DOB [birth year], MRN [id number]  PCP:  [name], MD  Cardiologist:  Sherren Mocha, MD    Chief Complaint  Patient presents with  . Chest Pain   History of Present Illness: Jake Kennedy is a 66 y.o. male who presents for follow-up of CAD. Presented with exertional angina in December 2016. He underwent stress testing which demonstrated inferoapical ischemia. Catheterization demonstrated severe LAD stenosis and he was treated with a drug-eluting stent. LV function is normal.  The patient has done very well since his PCI procedure until 3 days ago. He was exercise treadmill developed left-sided chest pressure. He was working out at high level at the time. He is otherwise able to physically active problems. The patient stopped to rest his symptoms went away with minutes. He got back on the treadmill to exercise again and symptoms recurred. Again he stopped and his symptoms resolved. Patient has had a few other very brief episodes of chest discomfort. He states his symptoms are very similar to what he experienced before his stent but lower in intensity. He has no shortness of breath, diaphoresis, nausea, vomiting, like swelling, orthopnea, or PND.   Past Medical History:  Diagnosis Date  . Basal cell carcinoma    moles removed in 2007 and 2012 from L nose and L hip  . BPH (benign prostatic hypertrophy)   . CAD (coronary artery disease), native coronary artery 02/11/2015   cath 02/10/2015 95 eccentric mid LAD stenosis treated with a 3.0 x 16 mm Promus DES, 30% mid LAD lesion, 30% D1 stenosis  . Coronary artery disease   . ED (erectile dysfunction)   . Lymphocytosis 02/11/2015   seen by Dr. Lindi Adie 02/11/2015, working up for possible CLL    Past Surgical History:  Procedure Laterality Date  . CARDIAC CATHETERIZATION  02/10/2015  . CARDIAC CATHETERIZATION N/A 02/10/2015   Procedure: Left Heart Cath and Coronary Angiography;   Surgeon: Sherren Mocha, MD;  Location: [facility];  Service: Cardiovascular;  Laterality: N/A;  . CARDIAC CATHETERIZATION N/A 02/10/2015   Procedure: Coronary Stent Intervention;  Surgeon: Sherren Mocha, MD;  Location: [facility];  Service: Cardiovascular;  Laterality: N/A;  . CORONARY STENT PLACEMENT  02/10/2015   DES to LAD  . LAMINECTOMY    . TONSILLECTOMY      Current Outpatient Prescriptions  Medication Sig Dispense Refill  . aspirin 81 MG tablet Take 81 mg by mouth daily.    Marland Kitchen atorvastatin (LIPITOR) 80 MG tablet Take 1 tablet (80 mg total) by mouth daily at 6 PM. 90 tablet 3  . Multiple Vitamin (MULTIVITAMIN) tablet Take 1 tablet by mouth daily.    . Multiple Vitamins-Minerals (PRESERVISION AREDS PO) Take 2 capsules by mouth daily.    . nitroGLYCERIN (NITROSTAT) 0.4 MG SL tablet Place 1 tablet (0.4 mg total) under the tongue every 5 (five) minutes as needed for chest pain. 25 tablet 3  . Omega-3 Fatty Acids (FISH OIL CONCENTRATE PO) Take 1 capsule by mouth daily.     . clopidogrel (PLAVIX) 75 MG tablet Take 1 tablet (75 mg total) by mouth daily. 90 tablet 3   No current facility-administered medications for this visit.     Allergies:   Adhesive  [tape] and Latex   Social History:  The patient  reports that he has never smoked. He has never used smokeless tobacco. He reports that he drinks alcohol. He reports that  he does not use drugs.   Family History:  The patient's  family history includes Diabetes in his mother; Healthy in his brother, brother, maternal grandfather, maternal grandmother, paternal grandfather, paternal grandmother, and sister; Heart attack in his mother; Heart failure in his mother.   ROS:  Please see the history of present illness.  Otherwise, review of systems is positive for dizziness.  All other systems are reviewed and negative.   PHYSICAL EXAM: VS:  BP 118/78   Pulse (!) 52   Ht 6\' 2"  (1.88 m)   Wt 176 lb 12.8 oz (80.2 kg)   BMI 22.70  kg/m  , BMI Body mass index is 22.7 kg/m. GEN: Well nourished, well developed, in no acute distress  HEENT: normal  Neck: no JVD, no masses. No carotid bruits Cardiac: RRR without murmur or gallop                Respiratory:  clear to auscultation bilaterally, normal work of breathing GI: soft, nontender, nondistended, + BS MS: no deformity or atrophy  Ext: no pretibial edema, pedal pulses 2+= bilaterally Skin: warm and dry, no rash Neuro:  Strength and sensation are intact Psych: euthymic mood, full affect  EKG:  EKG is ordered today. The ekg ordered today shows sinus bradycardia 52 bpm, LAFB  Recent Labs: 07/11/2016: BUN 21; Creatinine, Ser 0.96; Hemoglobin 15.7; Platelets 171; Potassium 4.8; Sodium 144   Lipid Panel     Component Value Date/Time   CHOL 105 (L) 04/05/2015 0757   TRIG 98 04/05/2015 0757   HDL 31 (L) 04/05/2015 0757   CHOLHDL 3.4 04/05/2015 0757   VLDL 20 04/05/2015 0757   LDLCALC 54 04/05/2015 0757      Wt Readings from Last 3 Encounters:  07/11/16 176 lb 12.8 oz (80.2 kg)  11/03/15 180 lb (81.6 kg)  09/25/15 178 lb 8 oz (81 kg)     Cardiac Studies Reviewed: Cardiac Cath 02-10-2015 Conclusion    Mid LAD-2 lesion, 95% stenosed. Post intervention, there is a 0% residual stenosis.  Mid LAD-1 lesion, 30% stenosed.  1st Diag lesion, 30% stenosed.   1. Severe LAD stenosis, treated successfully with PCI using a drug-eluting stent platform 2. Moderate diffuse RCA stenosis, recommend medical therapy 3. Patent left circumflex without significant stenosis  Recommend dual antiplatelet therapy with aspirin and Plavix for 12 months without interruption. The patient needs aggressive risk reduction. A high-dose statin drug will be initiated.   ASSESSMENT AND PLAN: 1.  Coronary artery disease, native vessel, with angina: The patient reports new-onset anginal symptoms are typical of his previous cardiac pain. We discussed potential diagnostic strategies including  stress testing versus invasive evaluation with cardiac catheterization. Considering his typical symptoms related to exertion and the fact that his symptoms are identical to his previous cardiac pain, we agree that going directly to cardiac catheterization is the most efficient way to evaluate him. I do not think stress testing will help guide treatment or therapy considering high pretest probability. The patient is instructed on use of sublingual nitroglycerin as needed. He is also counseled to seek immediate medical attention if he has resting symptoms or progressive angina.  I have reviewed the risks, indications, and alternatives to cardiac catheterization, possible angioplasty, and stenting with the patient. Risks include but are not limited to bleeding, infection, vascular injury, stroke, myocardial infection, arrhythmia, kidney injury, radiation-related injury in the case of prolonged fluoroscopy use, emergency cardiac surgery, and death. The patient understands the risks of serious complication is  1-2 in 1000 with diagnostic cardiac cath and 1-2% or less with angioplasty/stenting.   2. Hyperlipidemia: The patient is treated with a high intensity statin drug (atorvastatin 80 mg)  3. CLL: Will check precath labs to include a CBC. The patient appears to be asymptomatic from this perspective.  Current medicines are reviewed with the patient today.  The patient does not have concerns regarding medicines.  Labs/ tests ordered today include:   Orders Placed This Encounter  Procedures  . Basic metabolic panel  . CBC  . INR/PT  . EKG 12-Lead    Disposition:   FU post-cath depending on findings  Signed, Sherren Mocha, MD  07/12/2016 5:20 PM    [facility] [address], [place], [place]  [address] Phone: [phone]; Fax: [phone]

## 2016-07-11 NOTE — Patient Instructions (Addendum)
Medication Instructions:  Your physician has recommended you make the following change in your medication:  1. START Plavix (clopidogrel) 75mg  take one tablet by mouth daily 2. Nitroglycerin refill sent to pharmacy  Labwork: Your physician recommends that you have lab work today: BMP, CBC and PT/INR  Testing/Procedures: Your physician has requested that you have a cardiac catheterization. Cardiac catheterization is used to diagnose and/or treat various heart conditions. Doctors may recommend this procedure for a number of different reasons. The most common reason is to evaluate chest pain. Chest pain can be a symptom of coronary artery disease (CAD), and cardiac catheterization can show whether plaque is narrowing or blocking your heart's arteries. This procedure is also used to evaluate the valves, as well as measure the blood flow and oxygen levels in different parts of your heart. For further information please visit [facility]. Please follow instruction sheet, as given.    [facility] [address], [address] [place] [address] Dept: [phone] Loc: [phone]  RIDER ERMIS  07/11/2016  You are scheduled for a Cardiac Catheterization on Tuesday, June 19 with Dr. Sherren Mocha.  1. Please arrive at the [facility] (Main Entrance A) at [facility]: [address] [place], [place] [address] at 5:30 AM (two hours before your procedure to ensure your preparation). Free valet parking service is available.   Special note: Every effort is made to have your procedure done on time. Please understand that emergencies sometimes delay scheduled procedures.  2. Diet: Do not eat or drink anything after midnight prior to your procedure except sips of water to take medications.  3. Labs: Labs will be performed in the office today.   4. Medication instructions in preparation for your  procedure:  On the morning of your procedure, take your Aspirin and Plavix/Clopidogrel and morning medicines.  You may use sips of water.  5. Plan for one night stay--bring personal belongings.  6. Bring a current list of your medications and current insurance cards.  7. You MUST have a responsible person to drive you home.  8. Someone MUST be with you the first 24 hours after you arrive home or your discharge will be delayed.  9. Please wear clothes that are easy to get on and off and wear slip-on shoes.  Thank you for allowing [place] to care for you!   -- [facility]   Follow-Up: We will arrange further follow-up after cardiac catheterization.   Any Other Special Instructions Will Be Listed Below (If Applicable).     If you need a refill on your cardiac medications before your next appointment, please call your pharmacy.

## 2016-07-12 LAB — BASIC METABOLIC PANEL
BUN/Creatinine Ratio: 22 (ref 10–24)
BUN: 21 mg/dL (ref 8–27)
CALCIUM: 9.6 mg/dL (ref 8.6–10.2)
CHLORIDE: 101 mmol/L (ref 96–106)
CO2: 29 mmol/L (ref 18–29)
Creatinine, Ser: 0.96 mg/dL (ref 0.76–1.27)
GFR calc Af Amer: 95 mL/min/{1.73_m2} (ref >59)
GFR calc non Af Amer: 82 mL/min/{1.73_m2} (ref >59)
GLUCOSE: 100 mg/dL — AB (ref 65–99)
POTASSIUM: 4.8 mmol/L (ref 3.5–5.2)
SODIUM: 144 mmol/L (ref 134–144)

## 2016-07-12 LAB — CBC
HEMOGLOBIN: 15.7 g/dL (ref 13.0–17.7)
Hematocrit: 47.3 % (ref 37.5–51.0)
MCH: 31.8 pg (ref 26.6–33.0)
MCHC: 33.2 g/dL (ref 31.5–35.7)
MCV: 96 fL (ref 79–97)
PLATELETS: 171 10*3/uL (ref 150–379)
RBC: 4.94 x10E6/uL (ref 4.14–5.80)
RDW: 14.1 % (ref 12.3–15.4)
WBC: 17.9 10*3/uL — ABNORMAL HIGH (ref 3.4–10.8)

## 2016-07-12 LAB — PROTIME-INR
INR: 1 (ref 0.8–1.2)
PROTHROMBIN TIME: 10.5 s (ref 9.1–12.0)

## 2016-07-13 ENCOUNTER — Other Ambulatory Visit: Payer: Self-pay

## 2016-07-13 ENCOUNTER — Emergency Department (HOSPITAL_COMMUNITY): Payer: BLUE CROSS/BLUE SHIELD

## 2016-07-13 ENCOUNTER — Observation Stay (HOSPITAL_COMMUNITY)
Admission: EM | Admit: 2016-07-13 | Discharge: 2016-07-15 | Disposition: A | Discharge status: Home / Self Care | Payer: BLUE CROSS/BLUE SHIELD | Attending: Internal Medicine | Admitting: Internal Medicine

## 2016-07-13 ENCOUNTER — Encounter (HOSPITAL_COMMUNITY): Payer: Self-pay | Admitting: Emergency Medicine

## 2016-07-13 DIAGNOSIS — C9110 Chronic lymphocytic leukemia of B-cell type not having achieved remission: Secondary | ICD-10-CM | POA: Diagnosis present

## 2016-07-13 DIAGNOSIS — Z7902 Long term (current) use of antithrombotics/antiplatelets: Secondary | ICD-10-CM | POA: Diagnosis not present

## 2016-07-13 DIAGNOSIS — R0789 Other chest pain: Principal | ICD-10-CM | POA: Insufficient documentation

## 2016-07-13 DIAGNOSIS — Z91040 Latex allergy status: Secondary | ICD-10-CM | POA: Insufficient documentation

## 2016-07-13 DIAGNOSIS — I2510 Atherosclerotic heart disease of native coronary artery without angina pectoris: Secondary | ICD-10-CM | POA: Diagnosis present

## 2016-07-13 DIAGNOSIS — D729 Disorder of white blood cells, unspecified: Secondary | ICD-10-CM

## 2016-07-13 DIAGNOSIS — Z955 Presence of coronary angioplasty implant and graft: Secondary | ICD-10-CM | POA: Diagnosis not present

## 2016-07-13 DIAGNOSIS — R001 Bradycardia, unspecified: Secondary | ICD-10-CM | POA: Insufficient documentation

## 2016-07-13 DIAGNOSIS — N400 Enlarged prostate without lower urinary tract symptoms: Secondary | ICD-10-CM | POA: Insufficient documentation

## 2016-07-13 DIAGNOSIS — R079 Chest pain, unspecified: Secondary | ICD-10-CM | POA: Diagnosis not present

## 2016-07-13 DIAGNOSIS — I208 Other forms of angina pectoris: Secondary | ICD-10-CM

## 2016-07-13 DIAGNOSIS — E785 Hyperlipidemia, unspecified: Secondary | ICD-10-CM | POA: Diagnosis not present

## 2016-07-13 DIAGNOSIS — Z8249 Family history of ischemic heart disease and other diseases of the circulatory system: Secondary | ICD-10-CM | POA: Insufficient documentation

## 2016-07-13 DIAGNOSIS — Z7982 Long term (current) use of aspirin: Secondary | ICD-10-CM | POA: Insufficient documentation

## 2016-07-13 DIAGNOSIS — R0602 Shortness of breath: Secondary | ICD-10-CM | POA: Diagnosis not present

## 2016-07-13 HISTORY — DX: Chronic lymphocytic leukemia of B-cell type not having achieved remission: C91.10

## 2016-07-13 LAB — TROPONIN I: Troponin I: <0.03 ng/mL (ref <0.03)

## 2016-07-13 LAB — CBC
HCT: 50.9 % (ref 39.0–52.0)
HCT: 45.1 % (ref 39.0–52.0)
HEMOGLOBIN: 16.8 g/dL (ref 13.0–17.0)
HEMOGLOBIN: 14.7 g/dL (ref 13.0–17.0)
MCH: 32.3 pg (ref 26.0–34.0)
MCH: 31.2 pg (ref 26.0–34.0)
MCHC: 33 g/dL (ref 30.0–36.0)
MCHC: 32.6 g/dL (ref 30.0–36.0)
MCV: 97.9 fL (ref 78.0–100.0)
MCV: 95.8 fL (ref 78.0–100.0)
PLATELETS: 133 10*3/uL — AB (ref 150–400)
Platelets: 130 10*3/uL — ABNORMAL LOW (ref 150–400)
RBC: 5.2 MIL/uL (ref 4.22–5.81)
RBC: 4.71 MIL/uL (ref 4.22–5.81)
RDW: 13.6 % (ref 11.5–15.5)
RDW: 13.4 % (ref 11.5–15.5)
WBC: 18.2 10*3/uL — ABNORMAL HIGH (ref 4.0–10.5)
WBC: 16.6 10*3/uL — AB (ref 4.0–10.5)

## 2016-07-13 LAB — I-STAT TROPONIN, ED: TROPONIN I, POC: 0 ng/mL (ref 0.00–0.08)

## 2016-07-13 LAB — PROTIME-INR
INR: 0.95
PROTHROMBIN TIME: 12.6 s (ref 11.4–15.2)

## 2016-07-13 LAB — BASIC METABOLIC PANEL
Anion gap: 6 (ref 5–15)
BUN: 22 mg/dL — ABNORMAL HIGH (ref 6–20)
CALCIUM: 9 mg/dL (ref 8.9–10.3)
CHLORIDE: 103 mmol/L (ref 101–111)
CO2: 28 mmol/L (ref 22–32)
CREATININE: 1 mg/dL (ref 0.61–1.24)
GFR calc Af Amer: >60 mL/min (ref >60)
GFR calc non Af Amer: >60 mL/min (ref >60)
GLUCOSE: 111 mg/dL — AB (ref 65–99)
Potassium: 4.6 mmol/L (ref 3.5–5.1)
Sodium: 137 mmol/L (ref 135–145)

## 2016-07-13 LAB — CREATININE, SERUM: CREATININE: 0.94 mg/dL (ref 0.61–1.24)

## 2016-07-13 MED ORDER — ONDANSETRON HCL 4 MG/2ML IJ SOLN: 4.0000 mg | Freq: Four times a day (QID) | INTRAMUSCULAR | Status: DC | PRN

## 2016-07-13 MED ORDER — NITROGLYCERIN 0.4 MG SL SUBL: 0.4000 mg | SUBLINGUAL_TABLET | SUBLINGUAL | Status: DC | PRN

## 2016-07-13 MED ORDER — ASPIRIN 81 MG PO CHEW
324.0000 mg | CHEWABLE_TABLET | Freq: Once | ORAL | Status: AC
  Administered 2016-07-13: 324 mg via ORAL
  Filled 2016-07-13: qty 4

## 2016-07-13 MED ORDER — SODIUM CHLORIDE 0.9% FLUSH: 3.0000 mL | INTRAVENOUS | Status: DC | PRN

## 2016-07-13 MED ORDER — CLOPIDOGREL BISULFATE 75 MG PO TABS
75.0000 mg | ORAL_TABLET | Freq: Every day | ORAL | Status: DC
  Administered 2016-07-14 – 2016-07-15 (×2): 75 mg via ORAL
  Filled 2016-07-13 (×2): qty 1

## 2016-07-13 MED ORDER — SODIUM CHLORIDE 0.9% FLUSH
3.0000 mL | Freq: Two times a day (BID) | INTRAVENOUS | Status: DC
  Administered 2016-07-13 – 2016-07-14 (×3): 3 mL via INTRAVENOUS

## 2016-07-13 MED ORDER — ACETAMINOPHEN 325 MG PO TABS: 650.0000 mg | ORAL_TABLET | ORAL | Status: DC | PRN

## 2016-07-13 MED ORDER — ATORVASTATIN CALCIUM 80 MG PO TABS
80.0000 mg | ORAL_TABLET | Freq: Every day | ORAL | Status: DC
  Administered 2016-07-13 – 2016-07-14 (×2): 80 mg via ORAL
  Filled 2016-07-13 (×2): qty 1

## 2016-07-13 MED ORDER — HEPARIN SODIUM (PORCINE) 5000 UNIT/ML IJ SOLN
5000.0000 [IU] | Freq: Three times a day (TID) | INTRAMUSCULAR | Status: DC
  Administered 2016-07-13: 5000 [IU] via SUBCUTANEOUS

## 2016-07-13 MED ORDER — SODIUM CHLORIDE 0.9 % IV SOLN: 250.0000 mL | INTRAVENOUS | Status: DC | PRN

## 2016-07-13 MED ORDER — ASPIRIN EC 81 MG PO TBEC
81.0000 mg | DELAYED_RELEASE_TABLET | Freq: Every day | ORAL | Status: DC
  Administered 2016-07-14 – 2016-07-15 (×2): 81 mg via ORAL
  Filled 2016-07-13 (×2): qty 1

## 2016-07-13 NOTE — ED Notes (Signed)
Cardiology at bedside.

## 2016-07-13 NOTE — ED Notes (Signed)
Card PA at bedside  

## 2016-07-13 NOTE — H&P (Signed)
Patient ID: Jake Kennedy MRN: [id number], DOB/AGE: Jun 14, 1950   Admit date: 07/13/2016   Primary Physician: [name], MD Primary Cardiologist: Dr.  Burt Knack Reason for admission: chest pain  HPI:  Jake Kennedy is a 66 y.o. male with a history of CAD s/p DES to LAD (01/2015), HLD, and CLL who presented to [facility] ED today with chest pain.   He initially presented with exertional angina in 01/2015. He underwent stress testing which demonstrated inferoapical ischemia. Subsequent catheterization demonstrated severe LAD stenosis and he was treated with a drug-eluting stent. LV function is normal.  He was seen by Dr Burt Knack in the office on 07/11/16 for evaluation of exertional chest pressure that occurred while working out on a treadmill. His symptoms were identical to those in 01/2015. Given his high pre test probability for obstructive CAD, he was set up for a heart cath on 07/23/16 with Dr. Burt Knack.   Since his appointment with Dr. Burt Knack he has had a couple episode of chest pain. This AM he had some chest tightness associated with numbness/tingling in his fingers and diaphoresis this AM. He just "wasn't feeling quite right." He took a SL NTG and then broke out in a sweat and felt dizzy. He called his wife to take him to the ER. He is now chest pain free but feels like he has a "funny feeling in his chest." No LE edema, orthopnea or PND. No dizziness or syncope. No blood in stool or urine.    Problem List  Past Medical History:  Diagnosis Date  . Basal cell carcinoma    moles removed in 2007 and 2012 from L nose and L hip  . BPH (benign prostatic hypertrophy)   . CAD (coronary artery disease), native coronary artery 02/11/2015   cath 02/10/2015 95 eccentric mid LAD stenosis treated with a 3.0 x 16 mm Promus DES, 30% mid LAD lesion, 30% D1 stenosis  . Coronary artery disease   . ED (erectile dysfunction)   . Lymphocytosis 02/11/2015   seen by Dr. Lindi Adie 02/11/2015, working up for possible CLL      Past Surgical History:  Procedure Laterality Date  . CARDIAC CATHETERIZATION  02/10/2015  . CARDIAC CATHETERIZATION N/A 02/10/2015   Procedure: Left Heart Cath and Coronary Angiography;  Surgeon: Sherren Mocha, MD;  Location: [facility];  Service: Cardiovascular;  Laterality: N/A;  . CARDIAC CATHETERIZATION N/A 02/10/2015   Procedure: Coronary Stent Intervention;  Surgeon: Sherren Mocha, MD;  Location: [facility];  Service: Cardiovascular;  Laterality: N/A;  . CORONARY STENT PLACEMENT  02/10/2015   DES to LAD  . LAMINECTOMY    . TONSILLECTOMY       Allergies  Allergies  Allergen Reactions  . Adhesive  [Tape] Other (See Comments)  . Latex Itching    Blisters and itching     Home Medications  Prior to Admission medications   Medication Sig Start Date End Date Taking? Authorizing Provider  aspirin 81 MG tablet Take 81 mg by mouth daily.   Yes [provider]  atorvastatin (LIPITOR) 80 MG tablet Take 1 tablet (80 mg total) by mouth daily at 6 PM. 07/11/16  Yes Sherren Mocha, MD  clopidogrel (PLAVIX) 75 MG tablet Take 1 tablet (75 mg total) by mouth daily. 07/11/16  Yes Sherren Mocha, MD  Multiple Vitamin (MULTIVITAMIN) tablet Take 1 tablet by mouth daily.   Yes [provider]  Multiple Vitamins-Minerals (PRESERVISION AREDS PO) Take 2 capsules by mouth  daily.   Yes [provider]  nitroGLYCERIN (NITROSTAT) 0.4 MG SL tablet Place 1 tablet (0.4 mg total) under the tongue every 5 (five) minutes as needed for chest pain. 07/11/16  Yes Sherren Mocha, MD  Omega-3 Fatty Acids (FISH OIL CONCENTRATE PO) Take 1 capsule by mouth daily.    Yes [provider]    Family History  Family History  Problem Relation Age of Onset  . Diabetes Mother   . Heart failure Mother   . Heart attack Mother        occured in 38s.   . Healthy Sister   . Healthy Brother   . Healthy Maternal Grandmother   . Healthy Maternal Grandfather   . Healthy  Paternal Grandmother   . Healthy Paternal Grandfather   . Healthy Brother    Family Status  Relation Status  . Mother Alive  . Father Deceased       throat cancer  . Sister Alive  . Brother Alive  . MGM Deceased  . MGF Deceased  . PGM Deceased  . PGF Deceased  . Brother Alive  . Brother Alive  . Brother Alive  . Brother Alive     Social History  Social History   Social History  . Marital status: Married    Spouse name: N/A  . Number of children: N/A  . Years of education: N/A   Occupational History  . Not on file.   Social History Main Topics  . Smoking status: Never Smoker  . Smokeless tobacco: Never Used  . Alcohol use 0.0 oz/week     Comment: occasional  . Drug use: No  . Sexual activity: Not on file   Other Topics Concern  . Not on file   Social History Narrative  . No narrative on file     Review of Systems General:  No chills, fever, night sweats or weight changes.  Cardiovascular:  +++ chest pain, No dyspnea on exertion, edema, orthopnea, palpitations, paroxysmal nocturnal dyspnea. Dermatological: No rash, lesions/masses Respiratory: No cough, dyspnea Urologic: No hematuria, dysuria Abdominal:   No nausea, vomiting, diarrhea, bright red blood per rectum, melena, or hematemesis Neurologic:  No visual changes, wkns, changes in mental status. All other systems reviewed and are otherwise negative except as noted above.  Physical Exam  Blood pressure 132/90, pulse (!) 58, temperature 97.6 F (36.4 C), temperature source Oral, resp. rate (!) 22, height 6\' 2"  (1.88 m), weight 176 lb (79.8 kg), SpO2 98 %.  General: Pleasant, NAD, healthy appearing white male Psych: Normal affect. Neuro: Alert and oriented X 3. Moves all extremities spontaneously. HEENT: Normal  Neck: Supple without bruits or JVD. Lungs:  Resp regular and unlabored, CTA. Heart: RRR no s3, s4, or murmurs. Abdomen: Soft, non-tender, non-distended, BS + x 4.  Extremities: No  clubbing, cyanosis or edema. DP/PT/Radials 2+ and equal bilaterally.  Labs  No results for input(s): CKTOTAL, CKMB, TROPONINI in the last 72 hours. Lab Results  Component Value Date   WBC 18.2 (H) 07/13/2016   HGB 16.8 07/13/2016   HCT 50.9 07/13/2016   MCV 97.9 07/13/2016   PLT 130 (L) 07/13/2016    Recent Labs Lab 07/13/16 1000  NA 137  K 4.6  CL 103  CO2 28  BUN 22*  CREATININE 1.00  CALCIUM 9.0  GLUCOSE 111*   Lab Results  Component Value Date   CHOL 105 (L) 04/05/2015   HDL 31 (L) 04/05/2015   LDLCALC 54 04/05/2015  TRIG 98 04/05/2015   No results found for: DDIMER   Radiology/Studies  Dg Chest 2 View  Result Date: 07/13/2016 CLINICAL DATA:  Chest pain and shortness of breath EXAM: CHEST  2 VIEW COMPARISON:  February 10, 2015 FINDINGS: Lungs are clear. Heart size and pulmonary vascularity are normal. No adenopathy. There are areas of degenerative change in the thoracic spine. IMPRESSION: No edema or consolidation. Electronically Signed   By: Lowella Grip III M.D.   On: 07/13/2016 10:12   2D ECHO: 02/28/2015 LV EF: 60% -   65% Study Conclusions - Left ventricle: The cavity size was normal. There was mild focal   basal hypertrophy of the septum. Systolic function was normal.   The estimated ejection fraction was in the range of 60% to 65%.   Wall motion was normal; there were no regional wall motion   abnormalities. Left ventricular diastolic function parameters   were normal. - Aortic valve: Transvalvular velocity was within the normal range.   There was no stenosis. There was no regurgitation. - Aorta: Ascending aortic diameter: 3.72 mm (S). - Mitral valve: Transvalvular velocity was within the normal range.   There was no evidence for stenosis. There was trivial   regurgitation. - Right ventricle: The cavity size was mildly dilated. Wall   thickness was normal. Systolic function was normal. - Tricuspid valve: There was trivial regurgitation. -  Pulmonic valve: Transvalvular velocity was within the normal   range. There was no evidence for stenosis. There was mild   regurgitation. - Inferior vena cava: The vessel was normal in size. The   respirophasic diameter changes were in the normal range (= 50%),   consistent with normal central venous pressure.   ECG  Sinus bradycardia with marked sinus arrhythmia, HR 57. LAFB. TWI in AVL that are mildly more pronounced than previous   ASSESSMENT AND PLAN Jake Kennedy is a 66 y.o. male with a history of CAD s/p DES to LAD (01/2015), HLD, and CLL who presented to [facility] ED today with chest pain.   Chest pain: troponin neg x 1 and ECG without acute ST or TW changes. There was some discussion about letting him go home and come back for outpatient cath on Monday, but that was not able to be arranged. We will admit him, cycle enzymes and schedule heart cath on Monday. Will start heparin if enzymes return positive.   CAD: continue ASA/plavix, statin. No BB 2/2 sinus bradycardia.   CLL: this has been stable  HLD: continue statin    Signed, Angelena Form, PA-C 07/13/2016, 12:20 PM  Pager [phone]  Cardiology attending  Patient seen and examined. Agree with the findings as documented above by Angelena Form, PAC. The patient is a 66 year old man with a history of known coronary artery disease status post drug-eluting stent of the LAD approximately a year and a half ago. He also has CLL. He presents today after experiencing chest pain with extreme exertion, with the symptoms similar to his chest pain with his acute coronary syndrome previously. He is now pain-free. His initial set of cardiac enzymes were negative. His EKG demonstrated no acute ST segment changes. His chest discomfort resolved with nitroglycerin. He denies medical noncompliance.  On physical exam he is a pleasant well-appearing 66 year old man in no acute distress. The blood pressure was 132/90, the pulse was in the 60s and  regular respirations were 18 cardiovascular exam revealed a regular rate rhythm and the lungs are clear bilaterally. Extremities demonstrated no edema.  Neurologic exam was nonfocal. EKG demonstrates normal sinus rhythm.  Assessment and plan 1. Exertional chest pain - the patient is pain-free and did not have evidence initially of acute myocardial ischemia or injury. We plan to repeat his troponin blood test and if negative, consider early discharge with outpatient follow-up with the possibility of a left heart catheterization being high. If his enzymes are elevated, we'll plan to admit the patient and start intravenous heparin and plan for left heart catheterization on Monday. 2. Known coronary disease - he is status post PCI in the past. He will continue his current medical therapy, and we will start intravenous heparin if his enzymes are abnormal.  Cristopher Peru, M.D.

## 2016-07-13 NOTE — ED Provider Notes (Signed)
[facility] DEPT Provider Note   CSN: [id number] Arrival date & time: 07/13/16  0945     History   Chief Complaint Chief Complaint  Patient presents with  . Chest Pain    HPI Jake Kennedy is a 66 y.o. male.   Chest Pain   This is a recurrent problem. The current episode started more than 2 days ago. The problem occurs daily. The problem has been gradually worsening. The pain is associated with exertion and rest. The pain is present in the substernal region. The pain is at a severity of 6/10. The pain is moderate. The quality of the pain is described as heavy and pressure-like. The pain does not radiate. Associated symptoms include dizziness. Pertinent negatives include no abdominal pain, no nausea, no numbness, no syncope, no vomiting and no weakness. He has tried nitroglycerin for the symptoms. The treatment provided mild relief.  His past medical history is significant for CAD.    Past Medical History:  Diagnosis Date  . Basal cell carcinoma    moles removed in 2007 and 2012 from L nose and L hip  . BPH (benign prostatic hypertrophy)   . CAD (coronary artery disease), native coronary artery 02/11/2015   a. 02/10/2015 95% mid LAD stenosis s/p DES  . CLL (chronic lymphocytic leukemia) ([place])   . ED (erectile dysfunction)   . Lymphocytosis 02/11/2015   seen by Dr. Lindi Adie 02/11/2015, working up for possible CLL    Patient Active Problem List   Diagnosis Date Noted  . HLD (hyperlipidemia) 07/14/2016  . Unstable angina ([place]) 07/13/2016  . Right elbow pain 03/07/2015  . Chronic lymphocytic leukemia (CLL), B-cell ([place]) 02/28/2015  . CAD (coronary artery disease), native coronary artery 02/11/2015  . Abnormal WBC count   . Exertional angina ([place]) 02/10/2015    Past Surgical History:  Procedure Laterality Date  . CARDIAC CATHETERIZATION  02/10/2015  . CARDIAC CATHETERIZATION N/A 02/10/2015   Procedure: Left Heart Cath and Coronary Angiography;  Surgeon: Sherren Mocha, MD;   Location: [facility];  Service: Cardiovascular;  Laterality: N/A;  . CARDIAC CATHETERIZATION N/A 02/10/2015   Procedure: Coronary Stent Intervention;  Surgeon: Sherren Mocha, MD;  Location: [facility];  Service: Cardiovascular;  Laterality: N/A;  . CORONARY STENT PLACEMENT  02/10/2015   DES to LAD  . LAMINECTOMY    . TONSILLECTOMY         Home Medications    Prior to Admission medications   Medication Sig Start Date End Date Taking? Authorizing Provider  aspirin 81 MG tablet Take 81 mg by mouth daily.   Yes [provider]  atorvastatin (LIPITOR) 80 MG tablet Take 1 tablet (80 mg total) by mouth daily at 6 PM. 07/11/16  Yes Sherren Mocha, MD  clopidogrel (PLAVIX) 75 MG tablet Take 1 tablet (75 mg total) by mouth daily. 07/11/16  Yes Sherren Mocha, MD  Multiple Vitamin (MULTIVITAMIN) tablet Take 1 tablet by mouth daily.   Yes [provider]  Multiple Vitamins-Minerals (PRESERVISION AREDS PO) Take 2 capsules by mouth daily.   Yes [provider]  nitroGLYCERIN (NITROSTAT) 0.4 MG SL tablet Place 1 tablet (0.4 mg total) under the tongue every 5 (five) minutes as needed for chest pain. 07/11/16  Yes Sherren Mocha, MD  Omega-3 Fatty Acids (FISH OIL CONCENTRATE PO) Take 1 capsule by mouth daily.    Yes [provider]    Family History Family History  Problem Relation Age of Onset  . Diabetes Mother   .  Heart failure Mother   . Heart attack Mother        occured in 35s.   . Healthy Sister   . Healthy Brother   . Healthy Maternal Grandmother   . Healthy Maternal Grandfather   . Healthy Paternal Grandmother   . Healthy Paternal Grandfather   . Healthy Brother     Social History Social History  Substance Use Topics  . Smoking status: Never Smoker  . Smokeless tobacco: Never Used  . Alcohol use 0.0 oz/week     Comment: occasional     Allergies   Adhesive  [tape] and Latex   Review of Systems Review of Systems    Cardiovascular: Positive for chest pain. Negative for syncope.  Gastrointestinal: Negative for abdominal pain, nausea and vomiting.  Neurological: Positive for dizziness. Negative for weakness and numbness.  All other systems reviewed and are negative.    Physical Exam Updated Vital Signs BP 110/71 (BP Location: Right Arm)   Pulse 75   Temp 98.2 F (36.8 C) (Oral)   Resp 16   Ht 6\' 2"  (1.88 m)   Wt 79.8 kg (176 lb)   SpO2 100%   BMI 22.60 kg/m   Physical Exam  Constitutional: He appears well-developed and well-nourished.  HENT:  Head: Normocephalic and atraumatic.  Eyes: Conjunctivae and EOM are normal.  Neck: Normal range of motion.  Cardiovascular: Normal rate and regular rhythm.  Exam reveals no friction rub.   No murmur heard. No pulse deficits  Pulmonary/Chest: Effort normal. No respiratory distress.  Abdominal: Soft. He exhibits no distension.  Musculoskeletal: Normal range of motion. He exhibits no edema, tenderness or deformity.  Neurological: He is alert.  Skin: Skin is warm and dry. Capillary refill takes less than 2 seconds.  Nursing note and vitals reviewed.    ED Treatments / Results  Labs (all labs ordered are listed, but only abnormal results are displayed) Labs Reviewed  BASIC METABOLIC PANEL - Abnormal; Notable for the following:       Result Value   Glucose, Bld 111 (*)    BUN 22 (*)    All other components within normal limits  CBC - Abnormal; Notable for the following:    WBC 18.2 (*)    Platelets 130 (*)    All other components within normal limits  CBC - Abnormal; Notable for the following:    WBC 16.6 (*)    Platelets 133 (*)    All other components within normal limits  COMPREHENSIVE METABOLIC PANEL - Abnormal; Notable for the following:    Total Protein 5.8 (*)    All other components within normal limits  LIPID PANEL - Abnormal; Notable for the following:    HDL 27 (*)    All other components within normal limits  CBC -  Abnormal; Notable for the following:    WBC 15.1 (*)    Platelets 141 (*)    All other components within normal limits  TROPONIN I  PROTIME-INR  CREATININE, SERUM  TROPONIN I  TROPONIN I  TROPONIN I  PROTIME-INR  HEMOGLOBIN A1C  I-STAT TROPOININ, ED    EKG  EKG Interpretation  Date/Time:  Saturday July 13 2016 09:54:28 EDT Ventricular Rate:  57 PR Interval:  188 QRS Duration: 102 QT Interval:  420 QTC Calculation: 408 R Axis:   -45 Text Interpretation:  Sinus bradycardia with marked sinus arrhythmia Left anterior fascicular block Abnormal ECG minimal t wave changes in aVL compared to january 2017 Confirmed by  Myeshia Fojtik, Corene Cornea [phone]) on 07/13/2016 10:50:02 AM       Radiology Dg Chest 2 View  Result Date: 07/13/2016 CLINICAL DATA:  Chest pain and shortness of breath EXAM: CHEST  2 VIEW COMPARISON:  February 10, 2015 FINDINGS: Lungs are clear. Heart size and pulmonary vascularity are normal. No adenopathy. There are areas of degenerative change in the thoracic spine. IMPRESSION: No edema or consolidation. Electronically Signed   By: Lowella Grip III M.D.   On: 07/13/2016 10:12    Procedures Procedures (including critical care time)  Medications Ordered in ED Medications  clopidogrel (PLAVIX) tablet 75 mg (not administered)  aspirin EC tablet 81 mg (not administered)  sodium chloride flush (NS) 0.9 % injection 3 mL (3 mLs Intravenous Given 07/13/16 2004)  sodium chloride flush (NS) 0.9 % injection 3 mL (not administered)  0.9 %  sodium chloride infusion (not administered)  nitroGLYCERIN (NITROSTAT) SL tablet 0.4 mg (not administered)  acetaminophen (TYLENOL) tablet 650 mg (not administered)  ondansetron (ZOFRAN) injection 4 mg (not administered)  heparin injection 5,000 Units (5,000 Units Subcutaneous Not Given 07/14/16 1572)  atorvastatin (LIPITOR) tablet 80 mg (80 mg Oral Given 07/13/16 2153)  aspirin chewable tablet 324 mg (324 mg Oral Given 07/13/16 1133)     Initial  Impression / Assessment and Plan / ED Course  I have reviewed the triage vital signs and the nursing notes.  Pertinent labs & imaging results that were available during my care of the patient were reviewed by me and considered in my medical decision making (see chart for details).    H/o stent 1.5 years ago, started having cp with exertion on Sunday, improved with rest (saw his cardiologist Thursday, cath scheduled for the 19th) but is now having it with rest as well, associated with dizziness. Took ntg which helped.  Here appears well. Pulses equal. Lungs clear. ecg ok. Troponin negative.  Concern for unstable angina. Will discuss with cardiology regarding cath/admission/rule out.  1118: Spoke with Dr. Lovena Le, will evaluate in ER.  Cardiology to admit for cath.  Final Clinical Impressions(s) / ED Diagnoses   Final diagnoses:  Chest pain, unspecified type      Merrily Pew, MD 07/14/16 6203

## 2016-07-13 NOTE — ED Triage Notes (Signed)
Pt c/o mid chest pain that radiates to left side onset for about 1 week. Pt seen by his cardiologist for same and has a cath scheduled. Pt took 1 nitro PTA with relief of pain, but now has dizziness.

## 2016-07-13 NOTE — ED Notes (Signed)
ED Provider at bedside. 

## 2016-07-14 ENCOUNTER — Encounter (HOSPITAL_COMMUNITY): Payer: Self-pay | Admitting: Physician Assistant

## 2016-07-14 DIAGNOSIS — I2510 Atherosclerotic heart disease of native coronary artery without angina pectoris: Secondary | ICD-10-CM | POA: Diagnosis not present

## 2016-07-14 DIAGNOSIS — I200 Unstable angina: Secondary | ICD-10-CM

## 2016-07-14 DIAGNOSIS — R0789 Other chest pain: Secondary | ICD-10-CM | POA: Diagnosis not present

## 2016-07-14 DIAGNOSIS — R001 Bradycardia, unspecified: Secondary | ICD-10-CM | POA: Diagnosis not present

## 2016-07-14 DIAGNOSIS — C9110 Chronic lymphocytic leukemia of B-cell type not having achieved remission: Secondary | ICD-10-CM | POA: Diagnosis not present

## 2016-07-14 DIAGNOSIS — E785 Hyperlipidemia, unspecified: Secondary | ICD-10-CM

## 2016-07-14 LAB — COMPREHENSIVE METABOLIC PANEL
ALBUMIN: 3.8 g/dL (ref 3.5–5.0)
ALT: 33 U/L (ref 17–63)
ANION GAP: 8 (ref 5–15)
AST: 25 U/L (ref 15–41)
Alkaline Phosphatase: 52 U/L (ref 38–126)
BUN: 19 mg/dL (ref 6–20)
CO2: 29 mmol/L (ref 22–32)
Calcium: 8.9 mg/dL (ref 8.9–10.3)
Chloride: 103 mmol/L (ref 101–111)
Creatinine, Ser: 1.03 mg/dL (ref 0.61–1.24)
GFR calc non Af Amer: >60 mL/min (ref >60)
GLUCOSE: 99 mg/dL (ref 65–99)
POTASSIUM: 4.8 mmol/L (ref 3.5–5.1)
SODIUM: 140 mmol/L (ref 135–145)
TOTAL PROTEIN: 5.8 g/dL — AB (ref 6.5–8.1)
Total Bilirubin: 1 mg/dL (ref 0.3–1.2)

## 2016-07-14 LAB — CBC
HEMATOCRIT: 47.1 % (ref 39.0–52.0)
Hemoglobin: 14.9 g/dL (ref 13.0–17.0)
MCH: 31.1 pg (ref 26.0–34.0)
MCHC: 31.6 g/dL (ref 30.0–36.0)
MCV: 98.3 fL (ref 78.0–100.0)
PLATELETS: 141 10*3/uL — AB (ref 150–400)
RBC: 4.79 MIL/uL (ref 4.22–5.81)
RDW: 13.7 % (ref 11.5–15.5)
WBC: 15.1 10*3/uL — AB (ref 4.0–10.5)

## 2016-07-14 LAB — LIPID PANEL
CHOL/HDL RATIO: 3.6 ratio
Cholesterol: 96 mg/dL (ref 0–200)
HDL: 27 mg/dL — AB (ref >40)
LDL CALC: 54 mg/dL (ref 0–99)
Triglycerides: 77 mg/dL (ref <150)
VLDL: 15 mg/dL (ref 0–40)

## 2016-07-14 LAB — HEMOGLOBIN A1C
HEMOGLOBIN A1C: 5.8 % — AB (ref 4.8–5.6)
Mean Plasma Glucose: 120 mg/dL

## 2016-07-14 LAB — TROPONIN I

## 2016-07-14 LAB — PROTIME-INR
INR: 1.03
Prothrombin Time: 13.5 seconds (ref 11.4–15.2)

## 2016-07-14 MED ORDER — SODIUM CHLORIDE 0.9% FLUSH: 3.0000 mL | Freq: Two times a day (BID) | INTRAVENOUS | Status: DC

## 2016-07-14 MED ORDER — SODIUM CHLORIDE 0.9 % WEIGHT BASED INFUSION: 1.0000 mL/kg/h | INTRAVENOUS | Status: DC

## 2016-07-14 MED ORDER — SODIUM CHLORIDE 0.9 % WEIGHT BASED INFUSION
3.0000 mL/kg/h | INTRAVENOUS | Status: DC
  Administered 2016-07-15: 3 mL/kg/h via INTRAVENOUS

## 2016-07-14 MED ORDER — SODIUM CHLORIDE 0.9 % IV SOLN: 250.0000 mL | INTRAVENOUS | Status: DC | PRN

## 2016-07-14 MED ORDER — SODIUM CHLORIDE 0.9% FLUSH: 3.0000 mL | INTRAVENOUS | Status: DC | PRN

## 2016-07-14 NOTE — Plan of Care (Signed)
Problem: Pain Managment: Goal: General experience of comfort will improve Outcome: Progressing Pt had no complaints of cp this shift.

## 2016-07-14 NOTE — Progress Notes (Signed)
Patient Name: Jake Kennedy Date of Encounter: 07/14/2016  Primary Cardiologist: Dr. Tyrell Kennedy Problem List     Principal Problem:   Unstable angina [facility]) Active Problems:   CAD (coronary artery disease), native coronary artery   Abnormal WBC count   Chronic lymphocytic leukemia (CLL), B-cell ([facility])   HLD (hyperlipidemia)     Subjective   No more chest pain. Has questions about cath   Inpatient Medications    Scheduled Meds: . aspirin EC  81 mg Oral Daily  . atorvastatin  80 mg Oral q1800  . clopidogrel  75 mg Oral Daily  . heparin  5,000 Units Subcutaneous Q8H  . sodium chloride flush  3 mL Intravenous Q12H   Continuous Infusions: . sodium chloride     PRN Meds: sodium chloride, acetaminophen, nitroGLYCERIN, ondansetron (ZOFRAN) IV, sodium chloride flush   Vital Signs    Vitals:   07/13/16 1547 07/13/16 2118 07/13/16 2232 07/14/16 0559  BP:  121/76 120/79 110/71  Pulse:  (!) 48  75  Resp:  16  16  Temp:  97.6 F (36.4 C)  98.2 F (36.8 C)  TempSrc:  Oral  Oral  SpO2: 97% 100%  100%  Weight:      Height:       No intake or output data in the 24 hours ending 07/14/16 0818 Filed Weights   07/13/16 0952  Weight: 176 lb (79.8 kg)    Physical Exam    GEN: Well nourished, well developed, in no acute distress.  HEENT: Grossly normal.  Neck: Supple, no JVD, carotid bruits, or masses. Cardiac: RRR, no murmurs, rubs, or gallops. No clubbing, cyanosis, edema.  Radials/DP/PT 2+ and equal bilaterally.  Respiratory:  Respirations regular and unlabored, clear to auscultation bilaterally. GI: Soft, nontender, nondistended, BS + x 4. MS: no deformity or atrophy. Skin: warm and dry, no rash. Neuro:  Strength and sensation are intact. Psych: AAOx3.  Normal affect.  Labs    CBC  Recent Labs  07/13/16 1555 07/14/16 0406  WBC 16.6* 15.1*  HGB 14.7 14.9  HCT 45.1 47.1  MCV 95.8 98.3  PLT 133* 962*   Basic Metabolic Panel  Recent Labs   07/13/16 1000 07/13/16 1555 07/14/16 0406  NA 137  --  140  K 4.6  --  4.8  CL 103  --  103  CO2 28  --  29  GLUCOSE 111*  --  99  BUN 22*  --  19  CREATININE 1.00 0.94 1.03  CALCIUM 9.0  --  8.9   Liver Function Tests  Recent Labs  07/14/16 0406  AST 25  ALT 33  ALKPHOS 52  BILITOT 1.0  PROT 5.8*  ALBUMIN 3.8   No results for input(s): LIPASE, AMYLASE in the last 72 hours. Cardiac Enzymes  Recent Labs  07/13/16 1555 07/13/16 2151 07/14/16 0406  TROPONINI <0.03 <0.03 <0.03   BNP Invalid input(s): POCBNP D-Dimer No results for input(s): DDIMER in the last 72 hours. Hemoglobin A1C No results for input(s): HGBA1C in the last 72 hours. Fasting Lipid Panel  Recent Labs  07/14/16 0406  CHOL 96  HDL 27*  LDLCALC 54  TRIG 77  CHOLHDL 3.6   Thyroid Function Tests No results for input(s): TSH, T4TOTAL, T3FREE, THYROIDAB in the last 72 hours.  Invalid input(s): FREET3  Telemetry    Sinus bradycardia with some PACs - Personally Reviewed  ECG    Sinus bradycardia with marked sinus arrhythmia, HR 57. LAFB.  TWI in AVL that are mildly more pronounced than previous - Personally Reviewed  Radiology    Dg Chest 2 View  Result Date: 07/13/2016 CLINICAL DATA:  Chest pain and shortness of breath EXAM: CHEST  2 VIEW COMPARISON:  February 10, 2015 FINDINGS: Lungs are clear. Heart size and pulmonary vascularity are normal. No adenopathy. There are areas of degenerative change in the thoracic spine. IMPRESSION: No edema or consolidation. Electronically Signed   By: Lowella Grip III M.D.   On: 07/13/2016 10:12    Cardiac Studies   none  Patient Profile      Jake Kennedy is a 66 y.o. male with a history of CAD s/p DES to LAD (01/2015), HLD, and CLL who presented to [facility] ED on 07/13/16 with chest pain.    Assessment & Plan    Chest pain: He has ruled out for MI with negative serial cardiac enzymes. ECG without acute ST or TW changes. Plan for cardiac cath on  Monday. Orders have been placed.  I have reviewed the risks, indications, and alternatives to cardiac catheterization and possible angioplasty/stenting with the patient. Risks include but are not limited to bleeding, infection, vascular injury, stroke, myocardial infection, arrhythmia, kidney injury, radiation-related injury in the case of prolonged fluoroscopy use, emergency cardiac surgery, and death. The patient understands the risks of serious complication is low (<0%).    CAD: continue ASA/plavix, statin. No BB 2/2 sinus bradycardia.   CLL: white count chronically elevated. Followed by PCP and Dr. Sonny Dandy in the cancer center  HLD: LDL at goal @54 . Continue statin   Signed, Angelena Form, PA-C  07/14/2016, 8:18 AM   Patient examined chart reviewed Anxious for cath Monday No active pain good right radial pulse Exam normal all questions answered orders done on board  [name]

## 2016-07-14 NOTE — Progress Notes (Signed)
Pt hr in 40's. Asymptomatic and vs wnl. Cardiology updated and instructed to page back if pt becomes symptomatic or if he has pauses. Will continue to monitor and assess pt this shift.

## 2016-07-15 ENCOUNTER — Encounter (HOSPITAL_COMMUNITY): Payer: Self-pay | Admitting: Cardiology

## 2016-07-15 ENCOUNTER — Encounter (HOSPITAL_COMMUNITY)
Admission: EM | Disposition: A | Discharge status: Home / Self Care | Payer: Self-pay | Source: Home / Self Care | Attending: Emergency Medicine

## 2016-07-15 DIAGNOSIS — R079 Chest pain, unspecified: Secondary | ICD-10-CM

## 2016-07-15 DIAGNOSIS — I25119 Atherosclerotic heart disease of native coronary artery with unspecified angina pectoris: Secondary | ICD-10-CM | POA: Diagnosis not present

## 2016-07-15 DIAGNOSIS — I2510 Atherosclerotic heart disease of native coronary artery without angina pectoris: Secondary | ICD-10-CM | POA: Diagnosis present

## 2016-07-15 HISTORY — PX: LEFT HEART CATH AND CORONARY ANGIOGRAPHY: CATH118249

## 2016-07-15 SURGERY — LEFT HEART CATH AND CORONARY ANGIOGRAPHY
Anesthesia: LOCAL

## 2016-07-15 MED ORDER — VERAPAMIL HCL 2.5 MG/ML IV SOLN
INTRAVENOUS | Status: DC | PRN
  Administered 2016-07-15: 10 mL via INTRA_ARTERIAL

## 2016-07-15 MED ORDER — SODIUM CHLORIDE 0.9 % WEIGHT BASED INFUSION: 1.0000 mL/kg/h | INTRAVENOUS | Status: AC

## 2016-07-15 MED ORDER — IOPAMIDOL (ISOVUE-370) INJECTION 76%
INTRAVENOUS | Status: AC
  Filled 2016-07-15: qty 100

## 2016-07-15 MED ORDER — MIDAZOLAM HCL 2 MG/2ML IJ SOLN
INTRAMUSCULAR | Status: DC | PRN
  Administered 2016-07-15: 1 mg via INTRAVENOUS

## 2016-07-15 MED ORDER — MIDAZOLAM HCL 2 MG/2ML IJ SOLN
INTRAMUSCULAR | Status: AC
  Filled 2016-07-15: qty 2

## 2016-07-15 MED ORDER — SODIUM CHLORIDE 0.9% FLUSH: 3.0000 mL | Freq: Two times a day (BID) | INTRAVENOUS | Status: DC

## 2016-07-15 MED ORDER — LIDOCAINE HCL (PF) 1 % IJ SOLN
INTRAMUSCULAR | Status: DC | PRN
  Administered 2016-07-15: 2 mL via SUBCUTANEOUS

## 2016-07-15 MED ORDER — FENTANYL CITRATE (PF) 100 MCG/2ML IJ SOLN
INTRAMUSCULAR | Status: AC
  Filled 2016-07-15: qty 2

## 2016-07-15 MED ORDER — FENTANYL CITRATE (PF) 100 MCG/2ML IJ SOLN
INTRAMUSCULAR | Status: DC | PRN
  Administered 2016-07-15: 25 ug via INTRAVENOUS

## 2016-07-15 MED ORDER — LIDOCAINE HCL (PF) 1 % IJ SOLN
INTRAMUSCULAR | Status: AC
  Filled 2016-07-15: qty 30

## 2016-07-15 MED ORDER — IOPAMIDOL (ISOVUE-370) INJECTION 76%
INTRAVENOUS | Status: DC | PRN
  Administered 2016-07-15: 80 mL via INTRA_ARTERIAL

## 2016-07-15 MED ORDER — SODIUM CHLORIDE 0.9% FLUSH: 3.0000 mL | INTRAVENOUS | Status: DC | PRN

## 2016-07-15 MED ORDER — HEPARIN (PORCINE) IN NACL 2-0.9 UNIT/ML-% IJ SOLN
INTRAMUSCULAR | Status: AC
  Filled 2016-07-15: qty 1000

## 2016-07-15 MED ORDER — VERAPAMIL HCL 2.5 MG/ML IV SOLN
INTRAVENOUS | Status: AC
  Filled 2016-07-15: qty 2

## 2016-07-15 MED ORDER — SODIUM CHLORIDE 0.9 % IV SOLN: 250.0000 mL | INTRAVENOUS | Status: DC | PRN

## 2016-07-15 MED ORDER — HEPARIN (PORCINE) IN NACL 2-0.9 UNIT/ML-% IJ SOLN
INTRAMUSCULAR | Status: AC | PRN
  Administered 2016-07-15: 1000 mL

## 2016-07-15 MED ORDER — HEPARIN SODIUM (PORCINE) 1000 UNIT/ML IJ SOLN
INTRAMUSCULAR | Status: DC | PRN
  Administered 2016-07-15: 4000 [IU] via INTRAVENOUS

## 2016-07-15 MED ORDER — HEPARIN SODIUM (PORCINE) 1000 UNIT/ML IJ SOLN
INTRAMUSCULAR | Status: AC
  Filled 2016-07-15: qty 1

## 2016-07-15 MED ORDER — AMLODIPINE BESYLATE 2.5 MG PO TABS: 2.5000 mg | ORAL_TABLET | Freq: Every day | ORAL | 11 refills | Status: DC

## 2016-07-15 SURGICAL SUPPLY — 12 items
CATH INFINITI 5 FR JL3.5 (CATHETERS) ×1 IMPLANT
CATH INFINITI 5FR AL1 (CATHETERS) ×1 IMPLANT
CATH INFINITI 5FR ANG PIGTAIL (CATHETERS) ×1 IMPLANT
DEVICE RAD COMP TR BAND LRG (VASCULAR PRODUCTS) ×1 IMPLANT
GLIDESHEATH SLEND SS 6F .021 (SHEATH) ×1 IMPLANT
GUIDEWIRE INQWIRE 1.5J.035X260 (WIRE) IMPLANT
INQWIRE 1.5J .035X260CM (WIRE) ×4
KIT HEART LEFT (KITS) ×2 IMPLANT
PACK CARDIAC CATHETERIZATION (CUSTOM PROCEDURE TRAY) ×2 IMPLANT
SYR MEDRAD MARK V 150ML (SYRINGE) ×2 IMPLANT
TRANSDUCER W/STOPCOCK (MISCELLANEOUS) ×2 IMPLANT
TUBING CIL FLEX 10 FLL-RA (TUBING) ×2 IMPLANT

## 2016-07-15 NOTE — H&P (View-Only) (Signed)
Patient Name: Jake Kennedy Date of Encounter: 07/14/2016  Primary Cardiologist: Dr. Tyrell Kennedy Problem List     Principal Problem:   Unstable angina [facility]) Active Problems:   CAD (coronary artery disease), native coronary artery   Abnormal WBC count   Chronic lymphocytic leukemia (CLL), B-cell ([facility])   HLD (hyperlipidemia)     Subjective   No more chest pain. Has questions about cath   Inpatient Medications    Scheduled Meds: . aspirin EC  81 mg Oral Daily  . atorvastatin  80 mg Oral q1800  . clopidogrel  75 mg Oral Daily  . heparin  5,000 Units Subcutaneous Q8H  . sodium chloride flush  3 mL Intravenous Q12H   Continuous Infusions: . sodium chloride     PRN Meds: sodium chloride, acetaminophen, nitroGLYCERIN, ondansetron (ZOFRAN) IV, sodium chloride flush   Vital Signs    Vitals:   07/13/16 1547 07/13/16 2118 07/13/16 2232 07/14/16 0559  BP:  121/76 120/79 110/71  Pulse:  (!) 48  75  Resp:  16  16  Temp:  97.6 F (36.4 C)  98.2 F (36.8 C)  TempSrc:  Oral  Oral  SpO2: 97% 100%  100%  Weight:      Height:       No intake or output data in the 24 hours ending 07/14/16 0818 Filed Weights   07/13/16 0952  Weight: 176 lb (79.8 kg)    Physical Exam    GEN: Well nourished, well developed, in no acute distress.  HEENT: Grossly normal.  Neck: Supple, no JVD, carotid bruits, or masses. Cardiac: RRR, no murmurs, rubs, or gallops. No clubbing, cyanosis, edema.  Radials/DP/PT 2+ and equal bilaterally.  Respiratory:  Respirations regular and unlabored, clear to auscultation bilaterally. GI: Soft, nontender, nondistended, BS + x 4. MS: no deformity or atrophy. Skin: warm and dry, no rash. Neuro:  Strength and sensation are intact. Psych: AAOx3.  Normal affect.  Labs    CBC  Recent Labs  07/13/16 1555 07/14/16 0406  WBC 16.6* 15.1*  HGB 14.7 14.9  HCT 45.1 47.1  MCV 95.8 98.3  PLT 133* 867*   Basic Metabolic Panel  Recent Labs   07/13/16 1000 07/13/16 1555 07/14/16 0406  NA 137  --  140  K 4.6  --  4.8  CL 103  --  103  CO2 28  --  29  GLUCOSE 111*  --  99  BUN 22*  --  19  CREATININE 1.00 0.94 1.03  CALCIUM 9.0  --  8.9   Liver Function Tests  Recent Labs  07/14/16 0406  AST 25  ALT 33  ALKPHOS 52  BILITOT 1.0  PROT 5.8*  ALBUMIN 3.8   No results for input(s): LIPASE, AMYLASE in the last 72 hours. Cardiac Enzymes  Recent Labs  07/13/16 1555 07/13/16 2151 07/14/16 0406  TROPONINI <0.03 <0.03 <0.03   BNP Invalid input(s): POCBNP D-Dimer No results for input(s): DDIMER in the last 72 hours. Hemoglobin A1C No results for input(s): HGBA1C in the last 72 hours. Fasting Lipid Panel  Recent Labs  07/14/16 0406  CHOL 96  HDL 27*  LDLCALC 54  TRIG 77  CHOLHDL 3.6   Thyroid Function Tests No results for input(s): TSH, T4TOTAL, T3FREE, THYROIDAB in the last 72 hours.  Invalid input(s): FREET3  Telemetry    Sinus bradycardia with some PACs - Personally Reviewed  ECG    Sinus bradycardia with marked sinus arrhythmia, HR 57. LAFB.  TWI in AVL that are mildly more pronounced than previous - Personally Reviewed  Radiology    Dg Chest 2 View  Result Date: 07/13/2016 CLINICAL DATA:  Chest pain and shortness of breath EXAM: CHEST  2 VIEW COMPARISON:  February 10, 2015 FINDINGS: Lungs are clear. Heart size and pulmonary vascularity are normal. No adenopathy. There are areas of degenerative change in the thoracic spine. IMPRESSION: No edema or consolidation. Electronically Signed   By: Jake Kennedy M.D.   On: 07/13/2016 10:12    Cardiac Studies   none  Patient Profile      Jake Kennedy is a 66 y.o. male with a history of CAD s/p DES to LAD (01/2015), HLD, and CLL who presented to [facility] ED on 07/13/16 with chest pain.    Assessment & Plan    Chest pain: He has ruled out for MI with negative serial cardiac enzymes. ECG without acute ST or TW changes. Plan for cardiac cath on  Monday. Orders have been placed.  I have reviewed the risks, indications, and alternatives to cardiac catheterization and possible angioplasty/stenting with the patient. Risks include but are not limited to bleeding, infection, vascular injury, stroke, myocardial infection, arrhythmia, kidney injury, radiation-related injury in the case of prolonged fluoroscopy use, emergency cardiac surgery, and death. The patient understands the risks of serious complication is low (<7%).    CAD: continue ASA/plavix, statin. No BB 2/2 sinus bradycardia.   CLL: white count chronically elevated. Followed by PCP and Dr. Sonny Kennedy in the cancer center  HLD: LDL at goal @54 . Continue statin   Signed, Jake Form, PA-C  07/14/2016, 8:18 AM   Patient examined chart reviewed Anxious for cath Monday No active pain good right radial pulse Exam normal all questions answered orders done on board  [name]

## 2016-07-15 NOTE — Discharge Instructions (Signed)
We have added a small dose of amlodipine to see if this helps your chest pain, in the case that coronary vasospasm was contributing.    Radial Site Care Refer to this sheet in the next few weeks. These instructions provide you with information on caring for yourself after your procedure. Your caregiver may also give you more specific instructions. Your treatment has been planned according to current medical practices, but problems sometimes occur. Call your caregiver if you have any problems or questions after your procedure. HOME CARE INSTRUCTIONS  You may shower the day after the procedure.Remove the bandage (dressing) and gently wash the site with plain soap and water.Gently pat the site dry.   Do not apply powder or lotion to the site.   Do not submerge the affected site in water for 3 to 5 days.   Inspect the site at least twice daily.   Do not flex or bend the affected arm for 24 hours.   No lifting over 5 pounds (2.3 kg) for 5 days after your procedure.   Do not drive home if you are discharged the same day of the procedure. Have someone else drive you.   You may drive 24 hours after the procedure unless otherwise instructed by your caregiver.  What to expect:  Any bruising will usually fade within 1 to 2 weeks.   Blood that collects in the tissue (hematoma) may be painful to the touch. It should usually decrease in size and tenderness within 1 to 2 weeks.  SEEK IMMEDIATE MEDICAL CARE IF:  You have unusual pain at the radial site.   You have redness, warmth, swelling, or pain at the radial site.   You have drainage (other than a small amount of blood on the dressing).   You have chills.   You have a fever or persistent symptoms for more than 72 hours.   You have a fever and your symptoms suddenly get worse.   Your arm becomes pale, cool, tingly, or numb.   You have heavy bleeding from the site. Hold pressure on the site.

## 2016-07-15 NOTE — Progress Notes (Signed)
Progress Note  Patient Name: Jake Kennedy Date of Encounter: 07/15/2016  Primary Cardiologist: Burt Knack  Subjective   No chest pain, anxious to have cath done today.   Inpatient Medications    Scheduled Meds: . aspirin EC  81 mg Oral Daily  . atorvastatin  80 mg Oral q1800  . clopidogrel  75 mg Oral Daily  . heparin  5,000 Units Subcutaneous Q8H  . sodium chloride flush  3 mL Intravenous Q12H  . sodium chloride flush  3 mL Intravenous Q12H   Continuous Infusions: . sodium chloride    . sodium chloride    . sodium chloride 1 mL/kg/hr (07/15/16 0700)   PRN Meds: sodium chloride, sodium chloride, acetaminophen, nitroGLYCERIN, ondansetron (ZOFRAN) IV, sodium chloride flush, sodium chloride flush   Vital Signs    Vitals:   07/14/16 0559 07/14/16 1500 07/14/16 2045 07/15/16 0500  BP: 110/71 126/87 127/82 135/76  Pulse: 75 76 (!) 56 72  Resp: 16 18 18 18   Temp: 98.2 F (36.8 C) 98.1 F (36.7 C) 98 F (36.7 C) 97.9 F (36.6 C)  TempSrc: Oral Oral Oral Oral  SpO2: 100%  100% 100%  Weight:    170 lb 11.2 oz (77.4 kg)  Height:        Intake/Output Summary (Last 24 hours) at 07/15/16 0817 Last data filed at 07/14/16 1737  Gross per 24 hour  Intake              760 ml  Output                0 ml  Net              760 ml   Filed Weights   07/13/16 0952 07/15/16 0500  Weight: 176 lb (79.8 kg) 170 lb 11.2 oz (77.4 kg)    Telemetry    SB, PACs - Personally Reviewed  ECG    N/A - Personally Reviewed  Physical Exam   General: Well developed, well nourished, male appearing in no acute distress. Head: Normocephalic, atraumatic.  Neck: Supple without bruits, JVD. Lungs:  Resp regular and unlabored, CTA. Heart: RRR, S1, S2, no S3, S4, or murmur; no rub. Abdomen: Soft, non-tender, non-distended with normoactive bowel sounds. No hepatomegaly. No rebound/guarding. No obvious abdominal masses. Extremities: No clubbing, cyanosis, edema. Distal pedal pulses are 2+  bilaterally. Neuro: Alert and oriented X 3. Moves all extremities spontaneously. Psych: Normal affect.  Labs    Chemistry Recent Labs Lab 07/11/16 1503 07/13/16 1000 07/13/16 1555 07/14/16 0406  NA 144 137  --  140  K 4.8 4.6  --  4.8  CL 101 103  --  103  CO2 29 28  --  29  GLUCOSE 100* 111*  --  99  BUN 21 22*  --  19  CREATININE 0.96 1.00 0.94 1.03  CALCIUM 9.6 9.0  --  8.9  PROT  --   --   --  5.8*  ALBUMIN  --   --   --  3.8  AST  --   --   --  25  ALT  --   --   --  33  ALKPHOS  --   --   --  52  BILITOT  --   --   --  1.0  GFRNONAA 82 >60 >60 >60  GFRAA 95 >60 >60 >60  ANIONGAP  --  6  --  8     Hematology Recent Labs Lab  07/13/16 1000 07/13/16 1555 07/14/16 0406  WBC 18.2* 16.6* 15.1*  RBC 5.20 4.71 4.79  HGB 16.8 14.7 14.9  HCT 50.9 45.1 47.1  MCV 97.9 95.8 98.3  MCH 32.3 31.2 31.1  MCHC 33.0 32.6 31.6  RDW 13.6 13.4 13.7  PLT 130* 133* 141*    Cardiac Enzymes Recent Labs Lab 07/13/16 1340 07/13/16 1555 07/13/16 2151 07/14/16 0406  TROPONINI <0.03 <0.03 <0.03 <0.03    Recent Labs Lab 07/13/16 1005  TROPIPOC 0.00    Lipid Panel     Component Value Date/Time   CHOL 96 07/14/2016 0406   TRIG 77 07/14/2016 0406   HDL 27 (L) 07/14/2016 0406   CHOLHDL 3.6 07/14/2016 0406   VLDL 15 07/14/2016 0406   LDLCALC 54 07/14/2016 0406   BNPNo results for input(s): BNP, PROBNP in the last 168 hours.   DDimer No results for input(s): DDIMER in the last 168 hours.    Radiology    Dg Chest 2 View  Result Date: 07/13/2016 CLINICAL DATA:  Chest pain and shortness of breath EXAM: CHEST  2 VIEW COMPARISON:  February 10, 2015 FINDINGS: Lungs are clear. Heart size and pulmonary vascularity are normal. No adenopathy. There are areas of degenerative change in the thoracic spine. IMPRESSION: No edema or consolidation. Electronically Signed   By: Lowella Grip III M.D.   On: 07/13/2016 10:12    Cardiac Studies   N/A  Patient Profile     66 y.o.  male with a history of CAD s/p DES to LAD (01/2015), HLD, and CLL who presented to [facility] ED on 07/13/16 with chest pain.  Assessment & Plan    1. Chest pain: Ruled out with enzymes, no further chest pain. Was seen in the office last week and planned for cath this week, but presented with chest pain over the weekend.  -- planned for cath today. No questions this morning.   2. CAD: On ASA, plavix, statin.   3. CLL: Reports WBC is chronically elevated. He is followed by Dr. Sonny Dandy at the [facility].   4. HL: on statin  Signed, Reino Bellis, NP  07/15/2016, 8:17 AM    Cardiac Catheterization Conclusion     Mid RCA lesion, 50 %stenosed.  Mid LAD-2 lesion, 0 %stenosed.  A drug eluting is in place  Mid LAD-1 lesion, 30 %stenosed.  1st Diag lesion, 30 %stenosed.  The left ventricular systolic function is normal.  LV end diastolic pressure is normal.  The left ventricular ejection fraction is 55-65% by visual estimate.   1. Nonobstructive CAD. The stent in the mid LAD is widely patent. There is 30% narrowing proximal to the stent. The RCA is a large and ectatic vessel with 50% stenosis in the mid vessel. This is unchanged from prior studies. 2. Normal LV function 3. Normal LVEDP.  Plan: no culprit lesion to explain his current chest pain. Recommend continued medical therapy.      Patient seen and examined. Agree with assessment and plan. Cath completed; results as noted above. Continue stain, ASA/Plavix.  Pt had been off plavix for 5 months. LDL 54 on atorvastatin 80 mg.  CLL with stable WBC elevation.    Since patient had similar chest pain leading to today's cath will add low dose amlodipine 2.5 mg in event of a component of spasm. HR in low 60s presently. Plan dc today and f/u with Dr. Burt Knack.   Troy Sine, MD, [facility] 07/15/2016 11:13 AM

## 2016-07-15 NOTE — Care Management Note (Signed)
Case Management Note  Patient Details  Name: Jake Kennedy MRN: [id number] Date of Birth: 07/06/1950  Subjective/Objective:                 Patient with order to DC  To home today. No CM needs, consults, orders identified at this time.    Action/Plan:  DC to home.   Expected Discharge Date:  07/15/16               Expected Discharge Plan:  Home/Self Care  In-House Referral:     Discharge planning Services  CM Consult  Post Acute Care Choice:    Choice offered to:     DME Arranged:    DME Agency:     HH Arranged:    HH Agency:     Status of Service:  Completed, signed off  If discussed at [employer] of Stay Meetings, dates discussed:    Additional Comments:  Carles Collet, RN 07/15/2016, 12:09 PM

## 2016-07-15 NOTE — Care Management Obs Status (Addendum)
MEDICARE OBSERVATION STATUS NOTIFICATION   Patient Details  Name: Jake Kennedy MRN: [id number] Date of Birth: 1950/12/16   Medicare Observation Status Notification Given:    NO  Discharged at less than 24 hours in observation.   Carles Collet, RN 07/15/2016, 12:11 PM

## 2016-07-15 NOTE — Discharge Summary (Signed)
Discharge Summary    Patient ID: Jake Kennedy,  MRN: [id number], DOB/AGE: Nov 04, 1950 66 y.o.  Admit date: 07/13/2016 Discharge date: 07/15/2016  Primary Care Provider: [name] Primary Cardiologist: Dr. Burt Knack   Discharge Diagnoses    Principal Problem:   Chest pain- due to possible coronary vasospasm  Active Problems:   Abnormal WBC count   Chronic lymphocytic leukemia (CLL), B-cell (HCC)   HLD (hyperlipidemia)   CAD (coronary artery disease), native coronary artery   Allergies Allergies  Allergen Reactions  . Adhesive  [Tape] Other (See Comments)  . Latex Itching    Blisters and itching     History of Present Illness     Jake Kennedy is a 66 y.o. male with a history of CAD s/p DES to LAD (01/2015), HLD, and CLL who presented to [facility] ED on 07/13/16 with chest pain.   He initially presented with exertional angina in 01/2015. He underwent stress testing which demonstrated inferoapical ischemia. Subsequent catheterization demonstrated severe LAD stenosis and he was treated with a drug-eluting stent. LV function is normal.   He was seen by Dr Burt Knack in the office on 07/11/16 for evaluation of exertional chest pressure that occurred while working out on a treadmill. His symptoms were identical to those in 01/2015. Given his high pre test probability for obstructive CAD, he was set up for a heart cath on 07/23/16 with Dr. Burt Knack. He had been off the plavix for 5 months at that time but it was restarted in anticipation of cath.   Since his appointment with Dr. Burt Knack he had a couple episode of chest pain. He then had an episode associated with dizziness and hand numbness on Saturday AM, 07/13/16, prompting him to seek care in the ER. He was admitted for observation and cath the following Monday   Hospital Course     Consultants: none  Chest pain: He ruled out for MI with negative serial cardiac enzymes. ECG without acute ST or TW changes. He underwent cath today which  showed non obst CAD with a widely patent LAD stent, normal LV function and LVEDP. He was started on amlodipine 2.5mg  daily to treat possible spasm.   CAD: continue ASA/plavix and statin. No BB 2/2 sinus bradycardia. If doing okay, we will stop plavix at follow up outpatient appointment   CLL: white count chronically elevated. Followed by PCP and Dr. Sonny Dandy in the cancer center  HLD: LDL at goal @54 . Continue statin    The patient has had an uncomplicated hospital course and is recovering well. The radial catheter site is stable. He has been seen by Dr. Claiborne Billings today and deemed ready for discharge home. All follow-up appointments have been scheduled. Discharge medications are listed below.  _____________  Discharge Vitals Blood pressure 121/62, pulse 63, temperature 97.9 F (36.6 C), temperature source Oral, resp. rate 11, height 6\' 2"  (1.88 m), weight 170 lb 11.2 oz (77.4 kg), SpO2 97 %.  Filed Weights   07/13/16 0952 07/15/16 0500  Weight: 176 lb (79.8 kg) 170 lb 11.2 oz (77.4 kg)    Labs & Radiologic Studies     CBC  Recent Labs  07/13/16 1555 07/14/16 0406  WBC 16.6* 15.1*  HGB 14.7 14.9  HCT 45.1 47.1  MCV 95.8 98.3  PLT 133* 323*   Basic Metabolic Panel  Recent Labs  07/13/16 1000 07/13/16 1555 07/14/16 0406  NA 137  --  140  K 4.6  --  4.8  CL 103  --  103  CO2 28  --  29  GLUCOSE 111*  --  99  BUN 22*  --  19  CREATININE 1.00 0.94 1.03  CALCIUM 9.0  --  8.9   Liver Function Tests  Recent Labs  07/14/16 0406  AST 25  ALT 33  ALKPHOS 52  BILITOT 1.0  PROT 5.8*  ALBUMIN 3.8   No results for input(s): LIPASE, AMYLASE in the last 72 hours. Cardiac Enzymes  Recent Labs  07/13/16 1555 07/13/16 2151 07/14/16 0406  TROPONINI <0.03 <0.03 <0.03   BNP Invalid input(s): POCBNP D-Dimer No results for input(s): DDIMER in the last 72 hours. Hemoglobin A1C  Recent Labs  07/13/16 1555  HGBA1C 5.8*   Fasting Lipid Panel  Recent Labs   07/14/16 0406  CHOL 96  HDL 27*  LDLCALC 54  TRIG 77  CHOLHDL 3.6   Thyroid Function Tests No results for input(s): TSH, T4TOTAL, T3FREE, THYROIDAB in the last 72 hours.  Invalid input(s): FREET3  Dg Chest 2 View  Result Date: 07/13/2016 CLINICAL DATA:  Chest pain and shortness of breath EXAM: CHEST  2 VIEW COMPARISON:  February 10, 2015 FINDINGS: Lungs are clear. Heart size and pulmonary vascularity are normal. No adenopathy. There are areas of degenerative change in the thoracic spine. IMPRESSION: No edema or consolidation. Electronically Signed   By: Lowella Grip III M.D.   On: 07/13/2016 10:12     Diagnostic Studies/Procedures    07/15/16 Left Heart Cath and Coronary Angiography  Conclusion     Mid RCA lesion, 50 %stenosed.  Mid LAD-2 lesion, 0 %stenosed.  A drug eluting is in place  Mid LAD-1 lesion, 30 %stenosed.  1st Diag lesion, 30 %stenosed.  The left ventricular systolic function is normal.  LV end diastolic pressure is normal.  The left ventricular ejection fraction is 55-65% by visual estimate.   1. Nonobstructive CAD. The stent in the mid LAD is widely patent. There is 30% narrowing proximal to the stent. The RCA is a large and ectatic vessel with 50% stenosis in the mid vessel. This is unchanged from prior studies. 2. Normal LV function 3. Normal LVEDP.  Plan: no culprit lesion to explain his current chest pain. Recommend continued medical therapy.      _____________    Disposition   Pt is being discharged home today in good condition.  Follow-up Plans & Appointments    Follow-up Information    Eileen Stanford, PA-C. Go on 08/09/2016.   Specialties:  Cardiology, Radiology Why:  @ 9:30am Contact information: [place] [id number] [phone]            Discharge Medications     Medication List    TAKE these medications   amLODipine 2.5 MG tablet Commonly known as:  NORVASC Take 1  tablet (2.5 mg total) by mouth daily.   aspirin 81 MG tablet Take 81 mg by mouth daily.   atorvastatin 80 MG tablet Commonly known as:  LIPITOR Take 1 tablet (80 mg total) by mouth daily at 6 PM.   clopidogrel 75 MG tablet Commonly known as:  PLAVIX Take 1 tablet (75 mg total) by mouth daily.   FISH OIL CONCENTRATE PO Take 1 capsule by mouth daily.   multivitamin tablet Take 1 tablet by mouth daily.   nitroGLYCERIN 0.4 MG SL tablet Commonly known as:  NITROSTAT Place 1 tablet (0.4 mg total) under the tongue every 5 (five) minutes as needed for chest pain.  PRESERVISION AREDS PO Take 2 capsules by mouth daily.         Outstanding Labs/Studies   none  Duration of Discharge Encounter   Greater than 30 minutes including physician time.  Signed, Angelena Form PA-C 07/15/2016, 12:05 PM

## 2016-07-15 NOTE — Interval H&P Note (Signed)
History and Physical Interval Note:  07/15/2016 9:04 AM  Jake Kennedy  has presented today for surgery, with the diagnosis of unstable angina  The various methods of treatment have been discussed with the patient and family. After consideration of risks, benefits and other options for treatment, the patient has consented to  Procedure(s): Left Heart Cath and Coronary Angiography (N/A) as a surgical intervention .  The patient's history has been reviewed, patient examined, no change in status, stable for surgery.  I have reviewed the patient's chart and labs.  Questions were answered to the patient's satisfaction.   Cath Lab Visit (complete for each Cath Lab visit)  Clinical Evaluation Leading to the Procedure:   ACS: Yes.    Non-ACS:    Anginal Classification: CCS IV  Anti-ischemic medical therapy: No Therapy  Non-Invasive Test Results: No non-invasive testing performed  Prior CABG: No previous CABG        Collier Salina [facility] 07/15/2016 9:05 AM

## 2016-07-23 ENCOUNTER — Ambulatory Visit (HOSPITAL_COMMUNITY)
Admission: RE | Admit: 2016-07-23 | Payer: BLUE CROSS/BLUE SHIELD | Source: Ambulatory Visit | Admitting: Cardiovascular Disease

## 2016-07-23 ENCOUNTER — Encounter (HOSPITAL_COMMUNITY): Admission: RE | Payer: Self-pay | Source: Ambulatory Visit

## 2016-07-23 SURGERY — LEFT HEART CATH AND CORONARY ANGIOGRAPHY
Anesthesia: LOCAL

## 2016-07-24 ENCOUNTER — Ambulatory Visit: Payer: BLUE CROSS/BLUE SHIELD | Admitting: Cardiovascular Disease

## 2016-08-08 NOTE — Progress Notes (Signed)
Cardiology Office Note    Date:  08/09/2016   ID:  Jake Kennedy, DOB [birth year], MRN [id number]  PCP:  [name], MD  Cardiologist: Dr. Burt Knack  CC: post hospital follow up.  History of Present Illness:  Jake Kennedy is a 66 y.o. male with a history of CAD s/p DES to LAD (01/2015), HLD, and CLL who presents to clinic for post hospital follow up.   He initially presented with exertional angina in 01/2015. He underwent stress testing which demonstrated inferoapical ischemia. Subsequent catheterization demonstrated severe LAD stenosis and he was treated with a drug-eluting stent. LV function is normal.   He was seen by Dr Burt Knack in the office on 07/11/16 for evaluation of exertional chest pressure that occurred while working out on a treadmill. His symptoms were identical to those in 01/2015. Given his high pre test probability for obstructive CAD, he was set up for a heart cath on 07/23/16 with Dr. Burt Knack. He had been off the plavix for 5 months at that time but it was restarted in anticipation of cath.   Since his appointment with Dr. Burt Knack he had a couple episode of chest pain. He then had an episode associated with dizziness and hand numbness on Saturday AM, 07/13/16, prompting him to seek care in the ER. He was admitted for observation and cath the following Monday. He ruled out for MI and underwent cath which showed non obst CAD with a widely patent LAD stent, normal LV function and LVEDP. He was started on amlodipine 2.5mg  daily to treat possible spasm.  Today he presents to clinic for follow up. He is feeling great and totally back to normal. He has been working out on the treadmill everyday and doing well. Would like to know if he can come off the amlodipine and see how he does. Also would like to come off the plavix. No LE edema, orthopnea or PND. He has occasionally has dizziness when standing from sitting but no syncope. No blood in stool or urine. No palpitations. No claudication.       Past Medical History:  Diagnosis Date  . Basal cell carcinoma    moles removed in 2007 and 2012 from L nose and L hip  . BPH (benign prostatic hypertrophy)   . CAD (coronary artery disease), native coronary artery    a. 02/10/2015 95% mid LAD stenosis s/p DES  b. 07/15/16: LHC showed stable non obst dz and patent stent  . CLL (chronic lymphocytic leukemia) ([place])   . ED (erectile dysfunction)   . Lymphocytosis 02/11/2015   seen by Dr. Lindi Adie 02/11/2015, working up for possible CLL    Past Surgical History:  Procedure Laterality Date  . CARDIAC CATHETERIZATION  02/10/2015  . CARDIAC CATHETERIZATION Jake Kennedy 02/10/2015   Procedure: Left Heart Cath and Coronary Angiography;  Surgeon: Sherren Mocha, MD;  Location: [facility];  Service: Cardiovascular;  Laterality: Jake Kennedy;  . CARDIAC CATHETERIZATION Jake Kennedy 02/10/2015   Procedure: Coronary Stent Intervention;  Surgeon: Sherren Mocha, MD;  Location: [facility];  Service: Cardiovascular;  Laterality: Jake Kennedy;  . CORONARY STENT PLACEMENT  02/10/2015   DES to LAD  . LAMINECTOMY    . LEFT HEART CATH AND CORONARY ANGIOGRAPHY Jake Kennedy 07/15/2016   Procedure: Left Heart Cath and Coronary Angiography;  Surgeon: Martinique, Peter M, MD;  Location: [facility];  Service: Cardiovascular;  Laterality: Jake Kennedy;  . TONSILLECTOMY      Current Medications: Outpatient Medications Prior to Visit  Medication Sig Dispense Refill  . aspirin 81 MG tablet Take 81 mg by mouth daily.    Marland Kitchen atorvastatin (LIPITOR) 80 MG tablet Take 1 tablet (80 mg total) by mouth daily at 6 PM. 90 tablet 3  . Multiple Vitamin (MULTIVITAMIN) tablet Take 1 tablet by mouth daily.    . Multiple Vitamins-Minerals (PRESERVISION AREDS PO) Take 2 capsules by mouth daily.    . nitroGLYCERIN (NITROSTAT) 0.4 MG SL tablet Place 1 tablet (0.4 mg total) under the tongue every 5 (five) minutes as needed for chest pain. 25 tablet 3  . Omega-3 Fatty Acids (FISH OIL CONCENTRATE PO) Take 1 capsule by mouth  daily.     Marland Kitchen amLODipine (NORVASC) 2.5 MG tablet Take 1 tablet (2.5 mg total) by mouth daily. 30 tablet 11  . clopidogrel (PLAVIX) 75 MG tablet Take 1 tablet (75 mg total) by mouth daily. 90 tablet 3   No facility-administered medications prior to visit.      Allergies:   Adhesive [tape] and Latex   Social History   Social History  . Marital status: Married    Spouse name: Jake Kennedy  . Number of children: Jake Kennedy  . Years of education: Jake Kennedy   Social History Main Topics  . Smoking status: Never Smoker  . Smokeless tobacco: Never Used  . Alcohol use 0.0 oz/week     Comment: occasional  . Drug use: No  . Sexual activity: Not Asked   Other Topics Concern  . None   Social History Narrative  . None     Family History:  The patient's family history includes Diabetes in his mother; Healthy in his brother, brother, maternal grandfather, maternal grandmother, paternal grandfather, paternal grandmother, and sister; Heart attack in his mother; Heart failure in his mother.      ROS:   Please see the history of present illness.    ROS All other systems reviewed and are negative.   PHYSICAL EXAM:   VS:  BP 108/78   Pulse 62   Ht 6\' 2"  (1.88 m)   Wt 177 lb 6.4 oz (80.5 kg)   SpO2 98%   BMI 22.78 kg/m    GEN: Well nourished, well developed, in no acute distress, fit white male HEENT: normal  Neck: no JVD, carotid bruits, or masses Cardiac: RRR; no murmurs, rubs, or gallops,no edema  Respiratory:  clear to auscultation bilaterally, normal work of breathing GI: soft, nontender, nondistended, + BS MS: no deformity or atrophy  Skin: warm and dry, no rash Neuro:  Alert and Oriented x 3, Strength and sensation are intact Psych: euthymic mood, full affect   Wt Readings from Last 3 Encounters:  08/09/16 177 lb 6.4 oz (80.5 kg)  07/15/16 170 lb 11.2 oz (77.4 kg)  07/11/16 176 lb 12.8 oz (80.2 kg)      Studies/Labs Reviewed:   EKG:  EKG is NOT ordered today.    Recent  Labs: 07/14/2016: ALT 33; BUN 19; Creatinine, Ser 1.03; Hemoglobin 14.9; Platelets 141; Potassium 4.8; Sodium 140   Lipid Panel    Component Value Date/Time   CHOL 96 07/14/2016 0406   TRIG 77 07/14/2016 0406   HDL 27 (L) 07/14/2016 0406   CHOLHDL 3.6 07/14/2016 0406   VLDL 15 07/14/2016 0406   LDLCALC 54 07/14/2016 0406    Additional studies/ records that were reviewed today include:  07/15/16 Left Heart Cath and Coronary Angiography  Conclusion    Mid RCA lesion, 50 %stenosed.  Mid LAD-2 lesion, 0 %stenosed.  A drug eluting is in place  Mid LAD-1 lesion, 30 %stenosed.  1st Diag lesion, 30 %stenosed.  The left ventricular systolic function is normal.  LV end diastolic pressure is normal.  The left ventricular ejection fraction is 55-65% by visual estimate.  1. Nonobstructive CAD. The stent in the mid LAD is widely patent. There is 30% narrowing proximal to the stent. The RCA is a large and ectatic vessel with 50% stenosis in the mid vessel. This is unchanged from prior studies. 2. Normal LV function 3. Normal LVEDP.  Plan: no culprit lesion to explain his current chest pain. Recommend continued medical therapy.       ASSESSMENT & PLAN:   CAD: recent cath with non obst CAD and widely patent LAD stent. Started on amlodipine 2.5mg  daily for possible spasm. He would like to stop taking this as well as plavix. I think this is reasonable. He will continue on asa and statin. No BB given baseline sinus bradycardia  CLL: this has been stable. White count is chronically elevated. Followed by PCP and Dr. Sonny Dandy in the cancer center  HLD: LDL well controlled at 54. Continue statin    Medication Adjustments/Labs and Tests Ordered: Current medicines are reviewed at length with the patient today.  Concerns regarding medicines are outlined above.  Medication changes, Labs and Tests ordered today are listed in the Patient Instructions below. Patient Instructions  Medication  Instructions:  Your physician has recommended you make the following change in your medication:  1.  STOP Plavix 2.  STOP Amlodipine  Labwork: None ordered  Testing/Procedures: None ordered  Follow-Up: Your physician recommends that you schedule a follow-up appointment in: 3 MONTHS WITH DR.  Burt Knack   Any Other Special Instructions Will Be Listed Below (If Applicable).    If you need a refill on your cardiac medications before your next appointment, please call your pharmacy.      Signed, Angelena Form, PA-C  08/09/2016 9:51 AM    [facility] [address], [place], [place]  [address] Phone: [phone]; Fax: [phone]

## 2016-08-09 ENCOUNTER — Encounter: Payer: Self-pay | Admitting: Physician Assistant

## 2016-08-09 ENCOUNTER — Ambulatory Visit (INDEPENDENT_AMBULATORY_CARE_PROVIDER_SITE_OTHER): Payer: BLUE CROSS/BLUE SHIELD | Admitting: Physician Assistant

## 2016-08-09 VITALS — BP 108/78 | HR 62 | Ht 74.0 in | Wt 177.4 lb

## 2016-08-09 DIAGNOSIS — E785 Hyperlipidemia, unspecified: Secondary | ICD-10-CM

## 2016-08-09 DIAGNOSIS — I2510 Atherosclerotic heart disease of native coronary artery without angina pectoris: Secondary | ICD-10-CM | POA: Diagnosis not present

## 2016-08-09 DIAGNOSIS — C9111 Chronic lymphocytic leukemia of B-cell type in remission: Secondary | ICD-10-CM

## 2016-08-09 NOTE — Patient Instructions (Addendum)
Medication Instructions:  Your physician has recommended you make the following change in your medication:  1.  STOP Plavix 2.  STOP Amlodipine  Labwork: None ordered  Testing/Procedures: None ordered  Follow-Up: Your physician recommends that you schedule a follow-up appointment in: 3 MONTHS WITH DR.  Burt Knack   Any Other Special Instructions Will Be Listed Below (If Applicable).    If you need a refill on your cardiac medications before your next appointment, please call your pharmacy.

## 2016-09-18 DIAGNOSIS — Z1211 Encounter for screening for malignant neoplasm of colon: Secondary | ICD-10-CM | POA: Diagnosis not present

## 2016-09-22 DIAGNOSIS — Z1211 Encounter for screening for malignant neoplasm of colon: Secondary | ICD-10-CM | POA: Diagnosis not present

## 2016-09-22 DIAGNOSIS — Z1212 Encounter for screening for malignant neoplasm of rectum: Secondary | ICD-10-CM | POA: Diagnosis not present

## 2016-09-27 DIAGNOSIS — H52223 Regular astigmatism, bilateral: Secondary | ICD-10-CM | POA: Diagnosis not present

## 2016-09-27 DIAGNOSIS — H524 Presbyopia: Secondary | ICD-10-CM | POA: Diagnosis not present

## 2016-09-27 DIAGNOSIS — H52213 Irregular astigmatism, bilateral: Secondary | ICD-10-CM | POA: Diagnosis not present

## 2016-09-27 DIAGNOSIS — H5213 Myopia, bilateral: Secondary | ICD-10-CM | POA: Diagnosis not present

## 2016-10-09 DIAGNOSIS — H5213 Myopia, bilateral: Secondary | ICD-10-CM | POA: Diagnosis not present

## 2016-10-14 DIAGNOSIS — C44619 Basal cell carcinoma of skin of left upper limb, including shoulder: Secondary | ICD-10-CM | POA: Diagnosis not present

## 2016-10-14 DIAGNOSIS — D225 Melanocytic nevi of trunk: Secondary | ICD-10-CM | POA: Diagnosis not present

## 2016-10-14 DIAGNOSIS — L820 Inflamed seborrheic keratosis: Secondary | ICD-10-CM | POA: Diagnosis not present

## 2016-10-14 DIAGNOSIS — D2262 Melanocytic nevi of left upper limb, including shoulder: Secondary | ICD-10-CM | POA: Diagnosis not present

## 2016-10-14 DIAGNOSIS — L821 Other seborrheic keratosis: Secondary | ICD-10-CM | POA: Diagnosis not present

## 2016-10-14 DIAGNOSIS — Z85828 Personal history of other malignant neoplasm of skin: Secondary | ICD-10-CM | POA: Diagnosis not present

## 2016-10-25 DIAGNOSIS — Z23 Encounter for immunization: Secondary | ICD-10-CM | POA: Diagnosis not present

## 2016-10-29 DIAGNOSIS — H353131 Nonexudative age-related macular degeneration, bilateral, early dry stage: Secondary | ICD-10-CM | POA: Diagnosis not present

## 2016-11-01 DIAGNOSIS — R590 Localized enlarged lymph nodes: Secondary | ICD-10-CM | POA: Diagnosis not present

## 2016-11-05 DIAGNOSIS — H5210 Myopia, unspecified eye: Secondary | ICD-10-CM | POA: Diagnosis not present

## 2016-11-11 ENCOUNTER — Ambulatory Visit (INDEPENDENT_AMBULATORY_CARE_PROVIDER_SITE_OTHER): Payer: BLUE CROSS/BLUE SHIELD | Admitting: Cardiovascular Disease

## 2016-11-11 ENCOUNTER — Encounter: Payer: Self-pay | Admitting: Cardiovascular Disease

## 2016-11-11 VITALS — BP 108/70 | HR 58 | Ht 74.0 in | Wt 178.0 lb

## 2016-11-11 DIAGNOSIS — I2510 Atherosclerotic heart disease of native coronary artery without angina pectoris: Secondary | ICD-10-CM

## 2016-11-11 NOTE — Patient Instructions (Signed)

## 2016-11-11 NOTE — Progress Notes (Signed)
Cardiology Office Note Date:  11/11/2016   ID:  Jake Kennedy, DOB Sep 25, 1950, MRN [id number]  PCP:  [name], MD  Cardiologist:  Sherren Mocha, MD    Chief Complaint  Patient presents with  . Follow-up    CAD     History of Present Illness: Jake Kennedy is a 66 y.o. male who presents for follow-up of CAD. He underwent PCI of the LAD in 2016 when he presented with exertional angina and an abnormal stress Myoview study. He developed recurrence of exertional chest pain in June 2018 and cardiac cath was performed, demonstrating patency of his stent site and stable, nonobstructive disease elsewhere. He presents today for follow-up, here alone.  He is jogging 2 miles, walking 3/4 miles, and using exercise machines 4 days per week. He's also playing golf every week. He feels very well. Today, he denies symptoms of palpitations, chest pain, shortness of breath, orthopnea, PND, lower extremity edema, or syncope. He has occasional dizziness which he describes as mild.    Past Medical History:  Diagnosis Date  . Basal cell carcinoma    moles removed in 2007 and 2012 from L nose and L hip  . BPH (benign prostatic hypertrophy)   . CAD (coronary artery disease), native coronary artery    a. 02/10/2015 95% mid LAD stenosis s/p DES  b. 07/15/16: LHC showed stable non obst dz and patent stent  . CLL (chronic lymphocytic leukemia) ([place])   . ED (erectile dysfunction)   . Lymphocytosis 02/11/2015   seen by Dr. Lindi Adie 02/11/2015, working up for possible CLL    Past Surgical History:  Procedure Laterality Date  . CARDIAC CATHETERIZATION  02/10/2015  . CARDIAC CATHETERIZATION N/A 02/10/2015   Procedure: Left Heart Cath and Coronary Angiography;  Surgeon: Sherren Mocha, MD;  Location: [facility];  Service: Cardiovascular;  Laterality: N/A;  . CARDIAC CATHETERIZATION N/A 02/10/2015   Procedure: Coronary Stent Intervention;  Surgeon: Sherren Mocha, MD;  Location: [facility];  Service:  Cardiovascular;  Laterality: N/A;  . CORONARY STENT PLACEMENT  02/10/2015   DES to LAD  . LAMINECTOMY    . LEFT HEART CATH AND CORONARY ANGIOGRAPHY N/A 07/15/2016   Procedure: Left Heart Cath and Coronary Angiography;  Surgeon: Martinique, Peter M, MD;  Location: [facility];  Service: Cardiovascular;  Laterality: N/A;  . TONSILLECTOMY      Current Outpatient Prescriptions  Medication Sig Dispense Refill  . aspirin 81 MG tablet Take 81 mg by mouth daily.    Marland Kitchen atorvastatin (LIPITOR) 80 MG tablet Take 1 tablet (80 mg total) by mouth daily at 6 PM. 90 tablet 3  . Multiple Vitamin (MULTIVITAMIN) tablet Take 1 tablet by mouth daily.    . Multiple Vitamins-Minerals (PRESERVISION AREDS PO) Take 2 capsules by mouth daily.    . nitroGLYCERIN (NITROSTAT) 0.4 MG SL tablet Place 1 tablet (0.4 mg total) under the tongue every 5 (five) minutes as needed for chest pain. 25 tablet 3  . Omega-3 Fatty Acids (FISH OIL CONCENTRATE PO) Take 1 capsule by mouth daily.     . vardenafil (LEVITRA) 10 MG tablet Take 10 mg by mouth as directed.     No current facility-administered medications for this visit.     Allergies:   Adhesive [tape] and Latex   Social History:  The patient  reports that he has never smoked. He has never used smokeless tobacco. He reports that he drinks alcohol. He reports that he does  not use drugs.   Family History:  The patient's family history includes Diabetes in his mother; Healthy in his brother, brother, maternal grandfather, maternal grandmother, paternal grandfather, paternal grandmother, and sister; Heart attack in his mother; Heart failure in his mother.    ROS:  Please see the history of present illness.  Otherwise, review of systems is positive for dizziness.  All other systems are reviewed and negative.    PHYSICAL EXAM: VS:  BP 108/70   Pulse (!) 58   Ht 6\' 2"  (1.88 m)   Wt 178 lb (80.7 kg)   BMI 22.85 kg/m  , BMI Body mass index is 22.85 kg/m. GEN: Well  nourished, well developed, in no acute distress  HEENT: normal  Neck: no JVD, no masses. No carotid bruits Cardiac: RRR without murmur or gallop                Respiratory:  clear to auscultation bilaterally, normal work of breathing GI: soft, nontender, nondistended, + BS MS: no deformity or atrophy  Ext: no pretibial edema, pedal pulses 2+= bilaterally Skin: warm and dry, no rash Neuro:  Strength and sensation are intact Psych: euthymic mood, full affect  EKG:  EKG is not ordered today.  Recent Labs: 07/14/2016: ALT 33; BUN 19; Creatinine, Ser 1.03; Hemoglobin 14.9; Platelets 141; Potassium 4.8; Sodium 140   Lipid Panel     Component Value Date/Time   CHOL 96 07/14/2016 0406   TRIG 77 07/14/2016 0406   HDL 27 (L) 07/14/2016 0406   CHOLHDL 3.6 07/14/2016 0406   VLDL 15 07/14/2016 0406   LDLCALC 54 07/14/2016 0406      Wt Readings from Last 3 Encounters:  11/11/16 178 lb (80.7 kg)  08/09/16 177 lb 6.4 oz (80.5 kg)  07/15/16 170 lb 11.2 oz (77.4 kg)     Cardiac Studies Reviewed: Cardiac Cath 07-15-2016: Conclusion     Mid RCA lesion, 50 %stenosed.  Mid LAD-2 lesion, 0 %stenosed.  A drug eluting is in place  Mid LAD-1 lesion, 30 %stenosed.  1st Diag lesion, 30 %stenosed.  The left ventricular systolic function is normal.  LV end diastolic pressure is normal.  The left ventricular ejection fraction is 55-65% by visual estimate.   1. Nonobstructive CAD. The stent in the mid LAD is widely patent. There is 30% narrowing proximal to the stent. The RCA is a large and ectatic vessel with 50% stenosis in the mid vessel. This is unchanged from prior studies. 2. Normal LV function 3. Normal LVEDP.  Plan: no culprit lesion to explain his current chest pain. Recommend continued medical therapy.      ASSESSMENT AND PLAN: 1.  CAD, native vessel, without angina: continue same Rx. Cath study reviewed again today.   2. Hyperlipidemia: continue atorvastatin 80 mg  daily, lipids at goal.   3. CLL: followed by Dr Laurann Montana with stable leukocytosis.   Current medicines are reviewed with the patient today.  The patient does not have concerns regarding medicines.  Labs/ tests ordered today include:  No orders of the defined types were placed in this encounter.   Disposition:   FU one year  Signed, Sherren Mocha, MD  11/11/2016 5:46 PM    [facility] [address], [place], [place]  [address] Phone: [phone]; Fax: [phone]

## 2016-12-13 DIAGNOSIS — L720 Epidermal cyst: Secondary | ICD-10-CM | POA: Diagnosis not present

## 2016-12-13 DIAGNOSIS — L820 Inflamed seborrheic keratosis: Secondary | ICD-10-CM | POA: Diagnosis not present

## 2017-02-11 DIAGNOSIS — M7542 Impingement syndrome of left shoulder: Secondary | ICD-10-CM | POA: Diagnosis not present

## 2017-02-11 DIAGNOSIS — M19012 Primary osteoarthritis, left shoulder: Secondary | ICD-10-CM | POA: Diagnosis not present

## 2017-02-27 DIAGNOSIS — I2510 Atherosclerotic heart disease of native coronary artery without angina pectoris: Secondary | ICD-10-CM | POA: Diagnosis not present

## 2017-02-27 DIAGNOSIS — Z1159 Encounter for screening for other viral diseases: Secondary | ICD-10-CM | POA: Diagnosis not present

## 2017-02-27 DIAGNOSIS — Z23 Encounter for immunization: Secondary | ICD-10-CM | POA: Diagnosis not present

## 2017-02-27 DIAGNOSIS — Z0000 Encounter for general adult medical examination without abnormal findings: Secondary | ICD-10-CM | POA: Diagnosis not present

## 2017-02-27 DIAGNOSIS — C9110 Chronic lymphocytic leukemia of B-cell type not having achieved remission: Secondary | ICD-10-CM | POA: Diagnosis not present

## 2017-02-27 DIAGNOSIS — R7301 Impaired fasting glucose: Secondary | ICD-10-CM | POA: Diagnosis not present

## 2017-02-27 DIAGNOSIS — Z125 Encounter for screening for malignant neoplasm of prostate: Secondary | ICD-10-CM | POA: Diagnosis not present

## 2017-03-28 DIAGNOSIS — H2513 Age-related nuclear cataract, bilateral: Secondary | ICD-10-CM | POA: Diagnosis not present

## 2017-03-28 DIAGNOSIS — D7589 Other specified diseases of blood and blood-forming organs: Secondary | ICD-10-CM | POA: Diagnosis not present

## 2017-03-28 DIAGNOSIS — H00012 Hordeolum externum right lower eyelid: Secondary | ICD-10-CM | POA: Diagnosis not present

## 2017-03-28 DIAGNOSIS — H353131 Nonexudative age-related macular degeneration, bilateral, early dry stage: Secondary | ICD-10-CM | POA: Diagnosis not present

## 2017-03-28 DIAGNOSIS — H43813 Vitreous degeneration, bilateral: Secondary | ICD-10-CM | POA: Diagnosis not present

## 2017-06-16 DIAGNOSIS — Z85828 Personal history of other malignant neoplasm of skin: Secondary | ICD-10-CM | POA: Diagnosis not present

## 2017-06-16 DIAGNOSIS — C44519 Basal cell carcinoma of skin of other part of trunk: Secondary | ICD-10-CM | POA: Diagnosis not present

## 2017-06-16 DIAGNOSIS — L821 Other seborrheic keratosis: Secondary | ICD-10-CM | POA: Diagnosis not present

## 2017-06-16 DIAGNOSIS — L570 Actinic keratosis: Secondary | ICD-10-CM | POA: Diagnosis not present

## 2017-06-16 DIAGNOSIS — D225 Melanocytic nevi of trunk: Secondary | ICD-10-CM | POA: Diagnosis not present

## 2017-07-03 ENCOUNTER — Other Ambulatory Visit: Payer: Self-pay | Admitting: Cardiovascular Disease

## 2017-07-03 DIAGNOSIS — I208 Other forms of angina pectoris: Secondary | ICD-10-CM

## 2017-07-11 DIAGNOSIS — M25562 Pain in left knee: Secondary | ICD-10-CM | POA: Diagnosis not present

## 2017-08-01 DIAGNOSIS — M25562 Pain in left knee: Secondary | ICD-10-CM | POA: Diagnosis not present

## 2017-08-12 DIAGNOSIS — M2392 Unspecified internal derangement of left knee: Secondary | ICD-10-CM | POA: Diagnosis not present

## 2017-08-12 DIAGNOSIS — S83412A Sprain of medial collateral ligament of left knee, initial encounter: Secondary | ICD-10-CM | POA: Diagnosis not present

## 2017-09-30 DIAGNOSIS — H524 Presbyopia: Secondary | ICD-10-CM | POA: Diagnosis not present

## 2017-09-30 DIAGNOSIS — H5213 Myopia, bilateral: Secondary | ICD-10-CM | POA: Diagnosis not present

## 2017-09-30 DIAGNOSIS — H52223 Regular astigmatism, bilateral: Secondary | ICD-10-CM | POA: Diagnosis not present

## 2017-10-07 ENCOUNTER — Other Ambulatory Visit: Payer: Self-pay | Admitting: Cardiovascular Disease

## 2017-10-07 DIAGNOSIS — I208 Other forms of angina pectoris: Secondary | ICD-10-CM

## 2017-10-07 DIAGNOSIS — I2089 Other forms of angina pectoris: Secondary | ICD-10-CM

## 2017-10-07 NOTE — Telephone Encounter (Signed)
Outpatient Medication Detail    Disp Refills Start End   atorvastatin (LIPITOR) 80 MG tablet 90 tablet 1 07/03/2017    Sig: TAKE 1 TABLET BY MOUTH ONCE DAILY AT 6 PM   Sent to pharmacy as: atorvastatin (LIPITOR) 80 MG tablet   E-Prescribing Status: Receipt confirmed by pharmacy (07/03/2017 3:51 PM EDT)   Associated Diagnoses   Exercise-induced angina [facility])     Pharmacy   [id number] - [address], [address] AT [place]

## 2017-10-09 ENCOUNTER — Telehealth: Payer: Self-pay | Admitting: Cardiovascular Disease

## 2017-10-09 NOTE — Telephone Encounter (Signed)
° ° °*  STAT* If patient is at the pharmacy, call can be transferred to refill team.   1. Which medications need to be refilled? (please list name of each medication and dose if known) atorvastatin (LIPITOR) 80 MG tablet  2. Which pharmacy/location (including street and city if local pharmacy) is medication to be sent to?[id number] - [address], [address] AT [place]  3. Do they need a 30 day or 90 day supply?[place]

## 2017-10-09 NOTE — Telephone Encounter (Signed)
Per epic, patient should have enough medication through November. I have already spoke with pharmacist and gave a verbal authorization.

## 2017-11-03 DIAGNOSIS — C9110 Chronic lymphocytic leukemia of B-cell type not having achieved remission: Secondary | ICD-10-CM | POA: Diagnosis not present

## 2017-11-03 DIAGNOSIS — R350 Frequency of micturition: Secondary | ICD-10-CM | POA: Diagnosis not present

## 2017-11-11 ENCOUNTER — Ambulatory Visit: Payer: BLUE CROSS/BLUE SHIELD | Admitting: Physician Assistant

## 2017-11-11 ENCOUNTER — Encounter: Payer: Self-pay | Admitting: Physician Assistant

## 2017-11-11 VITALS — BP 110/60 | HR 55 | Ht 74.0 in | Wt 171.8 lb

## 2017-11-11 DIAGNOSIS — E785 Hyperlipidemia, unspecified: Secondary | ICD-10-CM | POA: Diagnosis not present

## 2017-11-11 DIAGNOSIS — I208 Other forms of angina pectoris: Secondary | ICD-10-CM

## 2017-11-11 DIAGNOSIS — I2510 Atherosclerotic heart disease of native coronary artery without angina pectoris: Secondary | ICD-10-CM

## 2017-11-11 LAB — LIPID PANEL
CHOLESTEROL TOTAL: 83 mg/dL — AB (ref 100–199)
Chol/HDL Ratio: 3.6 ratio (ref 0.0–5.0)
HDL: 23 mg/dL — AB (ref >39)
LDL Calculated: 45 mg/dL (ref 0–99)
TRIGLYCERIDES: 74 mg/dL (ref 0–149)
VLDL Cholesterol Cal: 15 mg/dL (ref 5–40)

## 2017-11-11 LAB — HEPATIC FUNCTION PANEL
ALK PHOS: 70 IU/L (ref 39–117)
ALT: 56 IU/L — ABNORMAL HIGH (ref 0–44)
AST: 29 IU/L (ref 0–40)
Albumin: 4 g/dL (ref 3.6–4.8)
BILIRUBIN, DIRECT: 0.19 mg/dL (ref 0.00–0.40)
Bilirubin Total: 0.7 mg/dL (ref 0.0–1.2)
TOTAL PROTEIN: 5.7 g/dL — AB (ref 6.0–8.5)

## 2017-11-11 MED ORDER — NITROGLYCERIN 0.4 MG SL SUBL: 0.4000 mg | SUBLINGUAL_TABLET | SUBLINGUAL | 3 refills | Status: DC | PRN

## 2017-11-11 MED ORDER — ATORVASTATIN CALCIUM 80 MG PO TABS: ORAL_TABLET | ORAL | 3 refills | Status: DC

## 2017-11-11 NOTE — Patient Instructions (Signed)
Medication Instructions:  REFILLS HAVE BEEN SENT IN FOR LIPITOR , NTG If you need a refill on your cardiac medications before your next appointment, please call your pharmacy.   Lab work: TODAY FASTING LIPID AND LIVER PANEL If you have labs (blood work) drawn today and your tests are completely normal, you will receive your results only by: Marland Kitchen MyChart Message (if you have MyChart) OR . A paper copy in the mail If you have any lab test that is abnormal or we need to change your treatment, we will call you to review the results.  Testing/Procedures: NONE ORDERED TODAY  Follow-Up: At [facility], you and your health needs are our priority.  As part of our continuing mission to provide you with exceptional heart care, we have created designated Provider Care Teams.  These Care Teams include your primary Cardiologist (physician) and Advanced Practice Providers (APPs -  Physician Assistants and Nurse Practitioners) who all work together to provide you with the care you need, when you need it. You will need a follow up appointment in:  1 years.  Please call our office 2 months in advance to schedule this appointment.  You may see Sherren Mocha, MD or one of the following Advanced Practice Providers on your designated Care Team: Richardson Dopp, PA-C [place], [place] . Daune Perch, NP  Any Other Special Instructions Will Be Listed Below (If Applicable).

## 2017-11-11 NOTE — Progress Notes (Signed)
Cardiology Office Note:    Date:  11/11/2017   ID:  Jake Kennedy, DOB Jan 18, 1951, MRN [id number]  PCP:  [name], MD  Cardiologist:  Sherren Mocha, MD   Electrophysiologist:  None   Referring MD: [name], MD   Chief Complaint  Patient presents with  . Follow-up    CAD     History of Present Illness:    Jake Kennedy is a 67 y.o. male with coronary artery disease status post stenting to the LAD in 2016, chronic lymphocytic leukemia, hyperlipidemia.  Cardiac catheterization in 2018 demonstrated patent LAD stent and mid RCA 50% stenosis.  He was last seen by Dr. Burt Knack in October 2018.     Mr. Jake Kennedy returns for follow up.  He is here alone.  Since last seen, he has not had any chest pain, shortness of breath, syncope.  He does get lightheaded when he stands quickly.  He has not had any paroxysmal nocturnal dyspnea, leg swelling.  He continues to do vigorous exercise several days a week.  He has not had any exercise intolerance.  But, he does feel tired later in the day after his exercise routine.   Prior CV studies:   The following studies were reviewed today:  Cardiac catheterization 07/15/2016 LM normal LAD mid 30 proximal to stent, mid stent patent, D1 30 RI normal LCx normal RCA mid 50 EF 55-65  Echo 02/28/2015 Mild focal basal septal hypertrophy, EF 60-65, normal wall motion, normal diastolic function, trivial TR, mild PI, ascending aorta 37 mm  Past Medical History:  Diagnosis Date  . Basal cell carcinoma    moles removed in 2007 and 2012 from L nose and L hip  . BPH (benign prostatic hypertrophy)   . CAD (coronary artery disease), native coronary artery    a. 02/10/2015 95% mid LAD stenosis s/p DES  b. 07/15/16: LHC showed stable non obst dz and patent stent  . CLL (chronic lymphocytic leukemia) ([place])   . ED (erectile dysfunction)   . Lymphocytosis 02/11/2015   seen by Dr. Lindi Adie 02/11/2015, working up for possible CLL   Surgical Hx: The patient  has a past  surgical history that includes Laminectomy; Coronary stent placement (02/10/2015); Cardiac catheterization (02/10/2015); Tonsillectomy; Cardiac catheterization (N/A, 02/10/2015); Cardiac catheterization (N/A, 02/10/2015); and LEFT HEART CATH AND CORONARY ANGIOGRAPHY (N/A, 07/15/2016).   Current Medications: Current Meds  Medication Sig  . aspirin 81 MG tablet Take 81 mg by mouth daily.  Marland Kitchen atorvastatin (LIPITOR) 80 MG tablet TAKE 1 TABLET BY MOUTH ONCE DAILY AT 6 PM  . Multiple Vitamin (MULTIVITAMIN) tablet Take 1 tablet by mouth daily.  . Multiple Vitamins-Minerals (PRESERVISION AREDS PO) Take 2 capsules by mouth daily.  . nitroGLYCERIN (NITROSTAT) 0.4 MG SL tablet Place 1 tablet (0.4 mg total) under the tongue every 5 (five) minutes as needed for chest pain.  . Omega-3 Fatty Acids (FISH OIL CONCENTRATE PO) Take 1 capsule by mouth daily.   . vardenafil (LEVITRA) 10 MG tablet Take 10 mg by mouth as directed.  . [DISCONTINUED] atorvastatin (LIPITOR) 80 MG tablet TAKE 1 TABLET BY MOUTH ONCE DAILY AT 6 PM  . [DISCONTINUED] nitroGLYCERIN (NITROSTAT) 0.4 MG SL tablet Place 1 tablet (0.4 mg total) under the tongue every 5 (five) minutes as needed for chest pain.     Allergies:   Adhesive [tape] and Latex   Social History   Tobacco Use  . Smoking status: Never Smoker  . Smokeless tobacco: Never Used  Substance Use  Topics  . Alcohol use: Yes    Alcohol/week: 0.0 standard drinks    Comment: occasional  . Drug use: No     Family Hx: The patient's family history includes Diabetes in his mother; Healthy in his brother, brother, maternal grandfather, maternal grandmother, paternal grandfather, paternal grandmother, and sister; Heart attack in his mother; Heart failure in his mother.  ROS:   Please see the history of present illness.    Review of Systems  Constitution: Positive for malaise/fatigue.  Neurological: Positive for dizziness.   All other systems reviewed and are  negative.   EKGs/Labs/Other Test Reviewed:    EKG:  EKG is  ordered today.  The ekg ordered today demonstrates sinus bradycardia, heart rate 55, left axis deviation, PACs, QTC 401, similar to old EKG  Recent Labs: No results found for requested labs within last 8760 hours.   Recent Lipid Panel Lab Results  Component Value Date/Time   CHOL 96 07/14/2016 04:06 AM   TRIG 77 07/14/2016 04:06 AM   HDL 27 (L) 07/14/2016 04:06 AM   CHOLHDL 3.6 07/14/2016 04:06 AM   LDLCALC 54 07/14/2016 04:06 AM    Physical Exam:    VS:  BP 110/60   Pulse (!) 55   Ht 6\' 2"  (1.88 m)   Wt 171 lb 12.8 oz (77.9 kg)   BMI 22.06 kg/m     Wt Readings from Last 3 Encounters:  11/11/17 171 lb 12.8 oz (77.9 kg)  11/11/16 178 lb (80.7 kg)  08/09/16 177 lb 6.4 oz (80.5 kg)     Physical Exam  Constitutional: He is oriented to person, place, and time. He appears well-developed and well-nourished. No distress.  HENT:  Head: Normocephalic and atraumatic.  Eyes: No scleral icterus.  Neck: No JVD present. Carotid bruit is not present.  Cardiovascular: Normal rate and regular rhythm.  No murmur heard. Pulmonary/Chest: Effort normal. He has no rales.  Abdominal: Soft.  Musculoskeletal: He exhibits no edema.  Neurological: He is alert and oriented to person, place, and time.  Skin: Skin is warm and dry.    ASSESSMENT & PLAN:    Coronary artery disease involving native coronary artery of native heart without angina pectoris  History of stenting to the LAD in 2016.  Cardiac catheterization in 2018 demonstrated patent LAD stent and 50% mid RCA stenosis.  He is not having any anginal symptoms.  He does feel fatigued after exercise.  He has not had exercise intolerance.  I have asked him to continue to monitor his symptoms.  If they seem to be progressing, I would recommend proceeding with stress testing.  He may also try holding his atorvastatin for 2 weeks to see if this helps his fatigue.  If he feels better  off of atorvastatin, consider changing to rosuvastatin.  Hyperlipidemia, unspecified hyperlipidemia type  As noted, he may try not taking atorvastatin for 2 weeks to see if he feels better.  If he does, we will try him on rosuvastatin.  Obtain follow-up lipids and LFTs.   Dispo:  Return in about 1 year (around 11/12/2018) for Routine Follow Up, w/ Dr. Burt Knack.   Medication Adjustments/Labs and Tests Ordered: Current medicines are reviewed at length with the patient today.  Concerns regarding medicines are outlined above.  Tests Ordered: Orders Placed This Encounter  Procedures  . Lipid Profile  . Hepatic function panel  . EKG 12-Lead   Medication Changes: Meds ordered this encounter  Medications  . atorvastatin (LIPITOR) 80 MG tablet  Sig: TAKE 1 TABLET BY MOUTH ONCE DAILY AT 6 PM    Dispense:  90 tablet    Refill:  3  . nitroGLYCERIN (NITROSTAT) 0.4 MG SL tablet    Sig: Place 1 tablet (0.4 mg total) under the tongue every 5 (five) minutes as needed for chest pain.    Dispense:  25 tablet    Refill:  3    Signed, Richardson Dopp, PA-C  11/11/2017 10:00 AM    [facility] [place], [place], [place]  [address] Phone: [phone]; Fax: [phone]

## 2017-11-12 ENCOUNTER — Telehealth: Payer: Self-pay | Admitting: *Deleted

## 2017-11-12 DIAGNOSIS — I2510 Atherosclerotic heart disease of native coronary artery without angina pectoris: Secondary | ICD-10-CM

## 2017-11-12 NOTE — Telephone Encounter (Signed)
Pt has been notified of lab results by phone with verbal understanding. Pt advised to increase dietary protein in his diet, went over some food recommendations. Pt advised need to repeat LFT in 4 weeks, lab scheduled for 12/08/17. Pt thanked me for the call.

## 2017-11-13 DIAGNOSIS — H5213 Myopia, bilateral: Secondary | ICD-10-CM | POA: Diagnosis not present

## 2017-11-27 DIAGNOSIS — R6882 Decreased libido: Secondary | ICD-10-CM | POA: Diagnosis not present

## 2017-11-27 DIAGNOSIS — R5383 Other fatigue: Secondary | ICD-10-CM | POA: Diagnosis not present

## 2017-12-08 ENCOUNTER — Other Ambulatory Visit: Payer: BLUE CROSS/BLUE SHIELD | Admitting: *Deleted

## 2017-12-08 DIAGNOSIS — I2510 Atherosclerotic heart disease of native coronary artery without angina pectoris: Secondary | ICD-10-CM

## 2017-12-09 LAB — HEPATIC FUNCTION PANEL
ALBUMIN: 4 g/dL (ref 3.6–4.8)
ALK PHOS: 62 IU/L (ref 39–117)
ALT: 25 IU/L (ref 0–44)
AST: 21 IU/L (ref 0–40)
BILIRUBIN, DIRECT: 0.16 mg/dL (ref 0.00–0.40)
Bilirubin Total: 0.5 mg/dL (ref 0.0–1.2)
TOTAL PROTEIN: 5.9 g/dL — AB (ref 6.0–8.5)

## 2017-12-10 ENCOUNTER — Telehealth: Payer: Self-pay | Admitting: Hematology and Oncology

## 2017-12-10 NOTE — Telephone Encounter (Signed)
Patient called to get a f/u appt witb Dr. Lindi Adie per his primary care physician.  Patient of Dr. Lindi Adie.

## 2017-12-23 NOTE — Progress Notes (Signed)
Patient Care Team: [name], MD as PCP - General (Internal Medicine) Sherren Mocha, MD as PCP - Cardiology (Cardiology)  DIAGNOSIS: Chronic lymphocytic leukemia of B-cell type not having achieved remission ([place]) - Plan: Vitamin B12, Lactate dehydrogenase (LDH), CBC with Differential ([place] Only), Reticulocytes, Flow Cytometry, ANA, IFA (with reflex), CMP ([place] only)  SUMMARY OF ONCOLOGIC HISTORY:   Chronic lymphocytic leukemia (CLL), B-cell ([place])   02/14/2015 Initial Diagnosis    Monoclonal B-cell population CD5 and CD20 positive, differential diagnosis CLL versus mantle cell lymphoma ( patient had absolute lymphocyte count of 8000 in 2010), remained stable.     CHIEF COMPLIANT: Follow up of CLL to review recent labs  INTERVAL HISTORY: Jake Kennedy is a 67 y.o. with above-mentioned history of B-cell CLL. I last saw the patient two years ago. In the interim he went to the ED on 07/13/16 for symptoms related to CAD and HLD that was followed by a cardiac catheterization on 07/15/17. He presents to the clinic today alone and brought new labs to review.  Patient felt in the left axilla  The lab reports he brought for 02/22/16 showed WBC 18, ALC 14.4 platelets 157. The CBC for 02/27/17 showed WBC 9.4, ALC 7.7, ANC 1.5, platelets 138, and decreased neutrophils and lymphocytes between the 02/22/16 and 02/27/17. His CBC on 11/04/17, when he reports he was sick, showed neutrophils 0.9, ALC 6.4, WBC 7.4, Hg 11.7. His CBC from 11/27/17 showed ANC 0.2, ALC 9.9, Hg 11.5, and platelets 142.  He had a cardiologist appointment recently and his protein showed a slight decrease from 6.0 to 5.7 and slightly elevated LFTs. He notes he is taking a statin and did not take an antibiotic when he was sick on 11/04/17. He notes he is exercising frequently, but no more than normal.    REVIEW OF SYSTEMS:   Constitutional: Denies fevers, chills or abnormal weight loss Eyes: Denies blurriness of  vision Ears, nose, mouth, throat, and face: Denies mucositis or sore throat Respiratory: Denies cough, dyspnea or wheezes Cardiovascular: Denies palpitation, chest discomfort Gastrointestinal:  Denies nausea, heartburn or change in bowel habits Skin: Denies abnormal skin rashes Lymphatics: Patient felt a lymph node in the left axilla Neurological:Denies numbness, tingling or new weaknesses Behavioral/Psych: Mood is stable, no new changes  Extremities: No lower extremity edema All other systems were reviewed with the patient and are negative.  I have reviewed the past medical history, past surgical history, social history and family history with the patient and they are unchanged from previous note.  ALLERGIES:  is allergic to adhesive [tape] and latex.  MEDICATIONS:  Current Outpatient Medications  Medication Sig Dispense Refill  . aspirin 81 MG tablet Take 81 mg by mouth daily.    Marland Kitchen atorvastatin (LIPITOR) 80 MG tablet TAKE 1 TABLET BY MOUTH ONCE DAILY AT 6 PM 90 tablet 3  . Multiple Vitamin (MULTIVITAMIN) tablet Take 1 tablet by mouth daily.    . Multiple Vitamins-Minerals (PRESERVISION AREDS PO) Take 2 capsules by mouth daily.    . nitroGLYCERIN (NITROSTAT) 0.4 MG SL tablet Place 1 tablet (0.4 mg total) under the tongue every 5 (five) minutes as needed for chest pain. 25 tablet 3  . Omega-3 Fatty Acids (FISH OIL CONCENTRATE PO) Take 1 capsule by mouth daily.     . vardenafil (LEVITRA) 10 MG tablet Take 10 mg by mouth as directed.     No current facility-administered medications for this visit.     PHYSICAL EXAMINATION: ECOG  PERFORMANCE STATUS: 1 - Symptomatic but completely ambulatory  Vitals:   12/25/17 1523  BP: 127/85  Pulse: 68  Resp: 18  Temp: 98.2 F (36.8 C)  SpO2: 100%   Filed Weights   12/25/17 1523  Weight: 173 lb 1.6 oz (78.5 kg)    GENERAL:alert, no distress and comfortable SKIN: skin color, texture, turgor are normal, no rashes or significant  lesions EYES: normal, Conjunctiva are pink and non-injected, sclera clear OROPHARYNX:no exudate, no erythema and lips, buccal mucosa, and tongue normal  NECK: supple, thyroid normal size, non-tender, without nodularity LYMPH: Palpable lymph node left axilla LUNGS: clear to auscultation and percussion with normal breathing effort HEART: regular rate & rhythm and no murmurs and no lower extremity edema ABDOMEN:abdomen soft, non-tender and normal bowel sounds MUSCULOSKELETAL:no cyanosis of digits and no clubbing  NEURO: alert & oriented x 3 with fluent speech, no focal motor/sensory deficits EXTREMITIES: No lower extremity edema  LABORATORY DATA:  I have reviewed the data as listed CMP Latest Ref Rng & Units 12/25/2017 12/08/2017 11/11/2017  Glucose 70 - 99 mg/dL 98 - -  BUN 8 - 23 mg/dL 29(H) - -  Creatinine 0.61 - 1.24 mg/dL 0.89 - -  Sodium 135 - 145 mmol/L 139 - -  Potassium 3.5 - 5.1 mmol/L 4.5 - -  Chloride 98 - 111 mmol/L 103 - -  CO2 22 - 32 mmol/L 27 - -  Calcium 8.9 - 10.3 mg/dL 9.0 - -  Total Protein 6.5 - 8.1 g/dL 6.3(L) 5.9(L) 5.7(L)  Total Bilirubin 0.3 - 1.2 mg/dL 0.7 0.5 0.7  Alkaline Phos 38 - 126 U/L 76 62 70  AST 15 - 41 U/L '23 21 29  '$ ALT 0 - 44 U/L 21 25 56(H)    Lab Results  Component Value Date   WBC 14.3 (H) 12/25/2017   HGB 10.7 (L) 12/25/2017   HCT 32.5 (L) 12/25/2017   MCV 116.9 (H) 12/25/2017   PLT 134 (L) 12/25/2017   NEUTROABS 0.7 (L) 12/25/2017    ASSESSMENT & PLAN:  Chronic lymphocytic leukemia (CLL), B-cell (HCC) CLL stage 0: Flow cytometry revealed a monoclonal B-cell population, lambda restricted expressing pan B cell antigens including CD20 with associated CD5 expression. Differential diagnosis CLL versus Mantle cell lymphoma FISH panel for CLL prognostic markers: trisomy 12 ( intermediate prognosis), no evidence of chromosome 13 mutation, p53, ATM deletions   Lab review over the last 2 years revealed decrease in the lymphocyte count as  well as decreasing the neutrophil count down to 0.2 He also developed anemia and mild thrombocytopenia along with an enlarged axillary lymph node   Recommendation:  1. Translocation 11:14  2. repeat CBC with differential 3.  Recheck flow cytometry 4.  TCR GR 5.  ANA 6.  B12  Lab review: WBC 14.3, hemoglobin 10.7, ANC 0.7, MCV 116.9 I recommended that we perform a bone marrow biopsy.  Based on his availability we elected to do this on December 2. Severe macrocytosis suggest a significant bone marrow disorder.  Bilateral axillary and cervical lymphadenopathy: Could be related to CLL or mantle cell.  We will await the results of 11: 14 translocation.    Orders Placed This Encounter  Procedures  . Vitamin B12    Standing Status:   Future    Number of Occurrences:   1    Standing Expiration Date:   12/25/2018  . Lactate dehydrogenase (LDH)    Standing Status:   Future    Number of Occurrences:  1    Standing Expiration Date:   12/25/2018  . CBC with Differential (Cancer Center Only)    Standing Status:   Future    Number of Occurrences:   1    Standing Expiration Date:   12/26/2018  . Reticulocytes    Standing Status:   Future    Number of Occurrences:   1    Standing Expiration Date:   12/26/2018  . Flow Cytometry    Standing Status:   Future    Number of Occurrences:   1    Standing Expiration Date:   12/25/2018  . ANA, IFA (with reflex)    Standing Status:   Future    Number of Occurrences:   1    Standing Expiration Date:   12/25/2018  . CMP ([place] only)    Standing Status:   Future    Number of Occurrences:   1    Standing Expiration Date:   12/26/2018  . Miscellaneous test  . T Cell Lymphoma, PCR   The patient has a good understanding of the overall plan. he agrees with it. he will call with any problems that may develop before the next visit here.   Harriette Ohara, MD 12/25/2017   I, Cloyde Reams Dorshimer, am acting as scribe for Nicholas Lose, MD.  I  have reviewed the above documentation for accuracy and completeness, and I agree with the above.

## 2017-12-25 ENCOUNTER — Inpatient Hospital Stay: Payer: BLUE CROSS/BLUE SHIELD | Attending: Hematology and Oncology | Admitting: Hematology and Oncology

## 2017-12-25 ENCOUNTER — Other Ambulatory Visit: Payer: Self-pay

## 2017-12-25 ENCOUNTER — Inpatient Hospital Stay: Payer: BLUE CROSS/BLUE SHIELD

## 2017-12-25 DIAGNOSIS — C9110 Chronic lymphocytic leukemia of B-cell type not having achieved remission: Secondary | ICD-10-CM

## 2017-12-25 DIAGNOSIS — R948 Abnormal results of function studies of other organs and systems: Secondary | ICD-10-CM | POA: Diagnosis not present

## 2017-12-25 DIAGNOSIS — E785 Hyperlipidemia, unspecified: Secondary | ICD-10-CM

## 2017-12-25 DIAGNOSIS — Z7982 Long term (current) use of aspirin: Secondary | ICD-10-CM | POA: Diagnosis not present

## 2017-12-25 DIAGNOSIS — I2510 Atherosclerotic heart disease of native coronary artery without angina pectoris: Secondary | ICD-10-CM | POA: Diagnosis not present

## 2017-12-25 DIAGNOSIS — D649 Anemia, unspecified: Secondary | ICD-10-CM | POA: Diagnosis not present

## 2017-12-25 DIAGNOSIS — D696 Thrombocytopenia, unspecified: Secondary | ICD-10-CM | POA: Diagnosis not present

## 2017-12-25 DIAGNOSIS — Z79899 Other long term (current) drug therapy: Secondary | ICD-10-CM | POA: Diagnosis not present

## 2017-12-25 DIAGNOSIS — C91Z0 Other lymphoid leukemia not having achieved remission: Secondary | ICD-10-CM | POA: Diagnosis not present

## 2017-12-25 LAB — CMP (CANCER CENTER ONLY)
ALK PHOS: 76 U/L (ref 38–126)
ALT: 21 U/L (ref 0–44)
AST: 23 U/L (ref 15–41)
Albumin: 3.7 g/dL (ref 3.5–5.0)
Anion gap: 9 (ref 5–15)
BUN: 29 mg/dL — ABNORMAL HIGH (ref 8–23)
CALCIUM: 9 mg/dL (ref 8.9–10.3)
CHLORIDE: 103 mmol/L (ref 98–111)
CO2: 27 mmol/L (ref 22–32)
CREATININE: 0.89 mg/dL (ref 0.61–1.24)
GFR, Est AFR Am: >60 mL/min (ref >60)
GFR, Estimated: >60 mL/min (ref >60)
GLUCOSE: 98 mg/dL (ref 70–99)
Potassium: 4.5 mmol/L (ref 3.5–5.1)
SODIUM: 139 mmol/L (ref 135–145)
Total Bilirubin: 0.7 mg/dL (ref 0.3–1.2)
Total Protein: 6.3 g/dL — ABNORMAL LOW (ref 6.5–8.1)

## 2017-12-25 LAB — CBC WITH DIFFERENTIAL (CANCER CENTER ONLY)
Abs Immature Granulocytes: 0 10*3/uL (ref 0.00–0.07)
BASOS ABS: 0 10*3/uL (ref 0.0–0.1)
Basophils Relative: 0 %
EOS PCT: 0 %
Eosinophils Absolute: 0 10*3/uL (ref 0.0–0.5)
HCT: 32.5 % — ABNORMAL LOW (ref 39.0–52.0)
Hemoglobin: 10.7 g/dL — ABNORMAL LOW (ref 13.0–17.0)
LYMPHS ABS: 13.3 10*3/uL — AB (ref 0.7–4.0)
LYMPHS PCT: 93 %
MCH: 38.5 pg — AB (ref 26.0–34.0)
MCHC: 32.9 g/dL (ref 30.0–36.0)
MCV: 116.9 fL — ABNORMAL HIGH (ref 80.0–100.0)
Monocytes Absolute: 0.3 10*3/uL (ref 0.1–1.0)
Monocytes Relative: 2 %
NEUTROS ABS: 0.7 10*3/uL — AB (ref 1.7–17.7)
NEUTROS PCT: 5 %
NRBC: 0 % (ref 0.0–0.2)
PLATELETS: 134 10*3/uL — AB (ref 150–400)
RBC: 2.78 MIL/uL — AB (ref 4.22–5.81)
RDW: 14.9 % (ref 11.5–15.5)
WBC: 14.3 10*3/uL — AB (ref 4.0–10.5)

## 2017-12-25 LAB — RETICULOCYTES
Immature Retic Fract: 25.5 % — ABNORMAL HIGH (ref 2.3–15.9)
RBC.: 2.78 MIL/uL — ABNORMAL LOW (ref 4.22–5.81)
Retic Count, Absolute: 51.2 10*3/uL (ref 19.0–186.0)
Retic Ct Pct: 1.8 % (ref 0.4–3.1)

## 2017-12-25 LAB — LACTATE DEHYDROGENASE: LDH: 207 U/L — AB (ref 98–192)

## 2017-12-25 LAB — VITAMIN B12: VITAMIN B 12: 304 pg/mL (ref 180–914)

## 2017-12-25 NOTE — Assessment & Plan Note (Signed)
CLL stage 0: Flow cytometry revealed a monoclonal B-cell population, lambda restricted expressing pan B cell antigens including CD20 with associated CD5 expression. Differential diagnosis CLL versus Mantle cell lymphoma FISH panel for CLL prognostic markers: trisomy 12 ( intermediate prognosis), no evidence of chromosome 13 mutation, p53, ATM deletions   Recommendation: Translocation 11:14 will be requested when he comes back to see [place] in 6 months to rule out mantle cell lymphoma. Since his disease course is extremely indolent, I did not ask him to get additional blood work today.  Previously I discussed with him about pathophysiology, staging, prognosis and indications for treatment of CLL.  Treatment plan: Observation Lab review 12/25/2017 Return to clinic in 1 year for follow-up.

## 2017-12-25 NOTE — Progress Notes (Signed)
Dr. Lindi Adie aware of [place] of 0.3.

## 2017-12-26 ENCOUNTER — Telehealth: Payer: Self-pay | Admitting: Hematology and Oncology

## 2017-12-26 ENCOUNTER — Other Ambulatory Visit: Payer: Self-pay

## 2017-12-26 ENCOUNTER — Encounter: Payer: Self-pay | Admitting: Hematology and Oncology

## 2017-12-26 DIAGNOSIS — C9110 Chronic lymphocytic leukemia of B-cell type not having achieved remission: Secondary | ICD-10-CM

## 2017-12-26 LAB — ANTINUCLEAR ANTIBODIES, IFA: ANA Ab, IFA: NEGATIVE

## 2017-12-26 NOTE — Progress Notes (Signed)
Pt calling to let Dr.Gudena know that he is available next tues for a Bone marrow biopsy. He would like to see if he can reschedule his original appt 12/2 to next tue 11/26. Discussed with Dr.Gudena and is available to do the bone marrow then. Confirmed appt with Flow cytometry lab and Melanie,AD for Tuesday. Pt was informed and aware of appt next week. He will get a confirmation call today from scheduling regarding the bone marrow appt. CBC lab entered in epic as well.

## 2017-12-26 NOTE — Telephone Encounter (Signed)
Patient is aware of appt scheduled per 11/22 sch message.

## 2017-12-26 NOTE — Telephone Encounter (Signed)
Per 11/21 los.  Bone marrow biopsy and lab appt already made (per sch msg) and on schedule for 11/26.  Therefore, follow-up appt added one week later for follow-up as requested by 11/21 los.

## 2017-12-30 ENCOUNTER — Inpatient Hospital Stay: Payer: BLUE CROSS/BLUE SHIELD

## 2017-12-30 ENCOUNTER — Inpatient Hospital Stay (HOSPITAL_BASED_OUTPATIENT_CLINIC_OR_DEPARTMENT_OTHER): Payer: BLUE CROSS/BLUE SHIELD | Admitting: Hematology and Oncology

## 2017-12-30 VITALS — BP 114/64 | HR 68 | Temp 98.0°F | Resp 17

## 2017-12-30 DIAGNOSIS — E785 Hyperlipidemia, unspecified: Secondary | ICD-10-CM

## 2017-12-30 DIAGNOSIS — C9110 Chronic lymphocytic leukemia of B-cell type not having achieved remission: Secondary | ICD-10-CM | POA: Diagnosis not present

## 2017-12-30 DIAGNOSIS — Z79899 Other long term (current) drug therapy: Secondary | ICD-10-CM | POA: Diagnosis not present

## 2017-12-30 DIAGNOSIS — D696 Thrombocytopenia, unspecified: Secondary | ICD-10-CM | POA: Diagnosis not present

## 2017-12-30 DIAGNOSIS — I2510 Atherosclerotic heart disease of native coronary artery without angina pectoris: Secondary | ICD-10-CM | POA: Diagnosis not present

## 2017-12-30 DIAGNOSIS — Z7982 Long term (current) use of aspirin: Secondary | ICD-10-CM | POA: Diagnosis not present

## 2017-12-30 DIAGNOSIS — R948 Abnormal results of function studies of other organs and systems: Secondary | ICD-10-CM

## 2017-12-30 DIAGNOSIS — D649 Anemia, unspecified: Secondary | ICD-10-CM | POA: Diagnosis not present

## 2017-12-30 LAB — CBC WITH DIFFERENTIAL (CANCER CENTER ONLY)
ABS IMMATURE GRANULOCYTES: 0 10*3/uL (ref 0.00–0.07)
BAND NEUTROPHILS: 2 %
BASOS PCT: 0 %
Basophils Absolute: 0 10*3/uL (ref 0.0–0.1)
Eosinophils Absolute: 0 10*3/uL (ref 0.0–0.5)
Eosinophils Relative: 0 %
HCT: 34 % — ABNORMAL LOW (ref 39.0–52.0)
Hemoglobin: 11.1 g/dL — ABNORMAL LOW (ref 13.0–17.0)
LYMPHS PCT: 94 %
Lymphs Abs: 13.7 10*3/uL — ABNORMAL HIGH (ref 0.7–4.0)
MCH: 38.4 pg — AB (ref 26.0–34.0)
MCHC: 32.6 g/dL (ref 30.0–36.0)
MCV: 117.6 fL — AB (ref 80.0–100.0)
MONO ABS: 0 10*3/uL — AB (ref 0.1–1.0)
Monocytes Relative: 0 %
NEUTROS ABS: 0.9 10*3/uL — AB (ref 1.7–17.7)
Neutrophils Relative %: 4 %
PLATELETS: 150 10*3/uL (ref 150–400)
RBC: 2.89 MIL/uL — AB (ref 4.22–5.81)
RDW: 14.7 % (ref 11.5–15.5)
WBC: 14.6 10*3/uL — AB (ref 4.0–10.5)
nRBC: 0 % (ref 0.0–0.2)

## 2017-12-30 NOTE — Progress Notes (Signed)
INDICATION: Acute onset of neutropenia and anemia  Brief examination was performed.   Bone Marrow Biopsy and Aspiration Procedure Note   Informed consent was obtained and potential risks including bleeding, infection and pain were reviewed with the patient.  The patient's name, date of birth, identification, consent and allergies were verified prior to the start of procedure and time out was performed.  The left posterior iliac crest was chosen as the site of biopsy.  The skin was prepped with ChloraPrep.   8 cc of 1% lidocaine was used to provide local anaesthesia.   10 cc of bone marrow aspirate was obtained followed by 1cm biopsy.  Pressure was applied to the biopsy site and bandage was placed over the biopsy site. Patient was made to lie on the back for 15 mins prior to discharge.  The procedure was tolerated well. COMPLICATIONS: None BLOOD LOSS: none The patient was discharged home in stable condition with a 1 week follow up to review results.  Patient was provided with post bone marrow biopsy instructions and instructed to call if there was any bleeding or worsening pain.  Specimens sent for flow cytometry, cytogenetics and additional studies.  Signed Harriette Ohara, MD

## 2017-12-31 ENCOUNTER — Telehealth: Payer: Self-pay | Admitting: Hematology and Oncology

## 2017-12-31 LAB — FLOW CYTOMETRY

## 2017-12-31 NOTE — Telephone Encounter (Signed)
No 11/26 los °

## 2018-01-05 ENCOUNTER — Telehealth: Payer: Self-pay | Admitting: Hematology and Oncology

## 2018-01-05 NOTE — Telephone Encounter (Signed)
I informed the patient the results of the bone marrow biopsy. It primarily showed CLL and no evidence of acute leukemia. He does not need a follow-up appointment this Wednesday. We will cancel that and have him see me in 3 months with labs.

## 2018-01-05 NOTE — Telephone Encounter (Signed)
R/s appt per 12/2 sch message - left message for patient with appt date and time

## 2018-01-07 ENCOUNTER — Ambulatory Visit: Payer: BLUE CROSS/BLUE SHIELD | Admitting: Adult Health

## 2018-01-13 LAB — T CELL LYMPOHMA, PCR (GENPATH)

## 2018-01-13 LAB — MISCELLANEOUS TEST

## 2018-01-16 ENCOUNTER — Encounter (HOSPITAL_COMMUNITY): Payer: Self-pay | Admitting: Hematology and Oncology

## 2018-01-26 DIAGNOSIS — R109 Unspecified abdominal pain: Secondary | ICD-10-CM | POA: Diagnosis not present

## 2018-01-29 ENCOUNTER — Ambulatory Visit: Payer: BLUE CROSS/BLUE SHIELD | Admitting: Hematology and Oncology

## 2018-02-05 DIAGNOSIS — D1801 Hemangioma of skin and subcutaneous tissue: Secondary | ICD-10-CM | POA: Diagnosis not present

## 2018-02-05 DIAGNOSIS — D2262 Melanocytic nevi of left upper limb, including shoulder: Secondary | ICD-10-CM | POA: Diagnosis not present

## 2018-02-05 DIAGNOSIS — L821 Other seborrheic keratosis: Secondary | ICD-10-CM | POA: Diagnosis not present

## 2018-02-05 DIAGNOSIS — L570 Actinic keratosis: Secondary | ICD-10-CM | POA: Diagnosis not present

## 2018-02-05 DIAGNOSIS — Z85828 Personal history of other malignant neoplasm of skin: Secondary | ICD-10-CM | POA: Diagnosis not present

## 2018-03-06 ENCOUNTER — Ambulatory Visit
Admission: RE | Admit: 2018-03-06 | Discharge: 2018-03-06 | Disposition: A | Discharge status: Home / Self Care | Payer: BLUE CROSS/BLUE SHIELD | Source: Ambulatory Visit | Attending: Internal Medicine | Admitting: Internal Medicine

## 2018-03-06 ENCOUNTER — Other Ambulatory Visit: Payer: Self-pay | Admitting: Internal Medicine

## 2018-03-06 DIAGNOSIS — R05 Cough: Secondary | ICD-10-CM

## 2018-03-06 DIAGNOSIS — R059 Cough, unspecified: Secondary | ICD-10-CM

## 2018-03-06 DIAGNOSIS — Z0000 Encounter for general adult medical examination without abnormal findings: Secondary | ICD-10-CM | POA: Diagnosis not present

## 2018-03-06 DIAGNOSIS — R0982 Postnasal drip: Secondary | ICD-10-CM | POA: Diagnosis not present

## 2018-03-06 DIAGNOSIS — C9110 Chronic lymphocytic leukemia of B-cell type not having achieved remission: Secondary | ICD-10-CM | POA: Diagnosis not present

## 2018-03-06 DIAGNOSIS — R7301 Impaired fasting glucose: Secondary | ICD-10-CM | POA: Diagnosis not present

## 2018-03-16 DIAGNOSIS — R509 Fever, unspecified: Secondary | ICD-10-CM | POA: Diagnosis not present

## 2018-03-16 DIAGNOSIS — R0981 Nasal congestion: Secondary | ICD-10-CM | POA: Diagnosis not present

## 2018-03-16 DIAGNOSIS — R591 Generalized enlarged lymph nodes: Secondary | ICD-10-CM | POA: Diagnosis not present

## 2018-03-16 DIAGNOSIS — R5383 Other fatigue: Secondary | ICD-10-CM | POA: Diagnosis not present

## 2018-03-17 ENCOUNTER — Inpatient Hospital Stay: Payer: BLUE CROSS/BLUE SHIELD

## 2018-03-17 ENCOUNTER — Telehealth: Payer: Self-pay | Admitting: Hematology and Oncology

## 2018-03-17 ENCOUNTER — Inpatient Hospital Stay: Payer: BLUE CROSS/BLUE SHIELD | Attending: Hematology and Oncology | Admitting: Hematology and Oncology

## 2018-03-17 VITALS — BP 133/72 | HR 90 | Temp 98.6°F | Resp 18 | Ht 74.0 in | Wt 170.1 lb

## 2018-03-17 DIAGNOSIS — Z79899 Other long term (current) drug therapy: Secondary | ICD-10-CM | POA: Diagnosis not present

## 2018-03-17 DIAGNOSIS — Z7982 Long term (current) use of aspirin: Secondary | ICD-10-CM

## 2018-03-17 DIAGNOSIS — Z85828 Personal history of other malignant neoplasm of skin: Secondary | ICD-10-CM | POA: Insufficient documentation

## 2018-03-17 DIAGNOSIS — D649 Anemia, unspecified: Secondary | ICD-10-CM | POA: Insufficient documentation

## 2018-03-17 DIAGNOSIS — E041 Nontoxic single thyroid nodule: Secondary | ICD-10-CM | POA: Diagnosis not present

## 2018-03-17 DIAGNOSIS — N400 Enlarged prostate without lower urinary tract symptoms: Secondary | ICD-10-CM | POA: Insufficient documentation

## 2018-03-17 DIAGNOSIS — R590 Localized enlarged lymph nodes: Secondary | ICD-10-CM | POA: Diagnosis not present

## 2018-03-17 DIAGNOSIS — C9110 Chronic lymphocytic leukemia of B-cell type not having achieved remission: Secondary | ICD-10-CM

## 2018-03-17 LAB — CMP (CANCER CENTER ONLY)
ALBUMIN: 3.4 g/dL — AB (ref 3.5–5.0)
ALT: 31 U/L (ref 0–44)
AST: 23 U/L (ref 15–41)
Alkaline Phosphatase: 84 U/L (ref 38–126)
Anion gap: 7 (ref 5–15)
BUN: 31 mg/dL — AB (ref 8–23)
CALCIUM: 8.7 mg/dL — AB (ref 8.9–10.3)
CO2: 28 mmol/L (ref 22–32)
Chloride: 104 mmol/L (ref 98–111)
Creatinine: 0.87 mg/dL (ref 0.61–1.24)
GFR, Est AFR Am: >60 mL/min (ref >60)
GFR, Estimated: >60 mL/min (ref >60)
GLUCOSE: 90 mg/dL (ref 70–99)
Potassium: 5.1 mmol/L (ref 3.5–5.1)
SODIUM: 139 mmol/L (ref 135–145)
Total Bilirubin: 0.7 mg/dL (ref 0.3–1.2)
Total Protein: 6.2 g/dL — ABNORMAL LOW (ref 6.5–8.1)

## 2018-03-17 LAB — CBC WITH DIFFERENTIAL (CANCER CENTER ONLY)
Abs Immature Granulocytes: 0 10*3/uL (ref 0.00–0.07)
Basophils Absolute: 0 10*3/uL (ref 0.0–0.1)
Basophils Relative: 0 %
Eosinophils Absolute: 0 10*3/uL (ref 0.0–0.5)
Eosinophils Relative: 0 %
HCT: 31 % — ABNORMAL LOW (ref 39.0–52.0)
Hemoglobin: 10.1 g/dL — ABNORMAL LOW (ref 13.0–17.0)
Lymphocytes Relative: 85 %
Lymphs Abs: 11.1 10*3/uL — ABNORMAL HIGH (ref 0.7–4.0)
MCH: 37.8 pg — AB (ref 26.0–34.0)
MCHC: 32.6 g/dL (ref 30.0–36.0)
MCV: 116.1 fL — ABNORMAL HIGH (ref 80.0–100.0)
MONO ABS: 1 10*3/uL (ref 0.1–1.0)
Monocytes Relative: 8 %
Neutro Abs: 0.9 10*3/uL — ABNORMAL LOW (ref 1.7–17.7)
Neutrophils Relative %: 7 %
Platelet Count: 103 10*3/uL — ABNORMAL LOW (ref 150–400)
RBC: 2.67 MIL/uL — ABNORMAL LOW (ref 4.22–5.81)
RDW: 15.3 % (ref 11.5–15.5)
WBC Count: 13.1 10*3/uL — ABNORMAL HIGH (ref 4.0–10.5)
nRBC: 0 % (ref 0.0–0.2)

## 2018-03-17 LAB — LACTATE DEHYDROGENASE: LDH: 241 U/L — ABNORMAL HIGH (ref 98–192)

## 2018-03-17 NOTE — Assessment & Plan Note (Signed)
CLL stage I with lymphocytosis and lymphadenopathy  Bone marrow biopsy November 2019: CLL without any evidence of transformation.  11:14 translocation negative.  Recent rapid increase in lymphadenopathy with fatigue, low-grade fevers and multiple antibiotic treatments  Plan: 1.  Recheck blood work today CBC differential, CMP and LDH 2.  CLL FISH panel to see if he has p53 gene mutation  I will request Dr.Kale to see the patient for treatment decisions.

## 2018-03-17 NOTE — Telephone Encounter (Signed)
Gave avs and calendar ° °

## 2018-03-17 NOTE — Progress Notes (Signed)
Patient Care Team: [name], MD as PCP - General (Internal Medicine) Sherren Mocha, MD as PCP - Cardiology (Cardiology)  DIAGNOSIS:    ICD-10-CM   1. Chronic lymphocytic leukemia of B-cell type not having achieved remission ([facility]) C91.10 CBC with Differential (Cancer Center Only)    CMP ([place] only)    Lactate dehydrogenase (LDH)    FISH, CLL Prognostic Panel    SUMMARY OF ONCOLOGIC HISTORY:   Chronic lymphocytic leukemia (CLL), B-cell ([place])   02/14/2015 Initial Diagnosis    Monoclonal B-cell population CD5 and CD20 positive, differential diagnosis CLL versus mantle cell lymphoma ( patient had absolute lymphocyte count of 8000 in 2010), remained stable.     CHIEF COMPLIANT: Follow-up of B-cell CLL  INTERVAL HISTORY: Jake Kennedy is a 68 y.o. with above-mentioned history of B-cell CLL. A bone marrow biopsy from 12/30/17 showed CLL and no evidence of acute leukemia. He recently had lab work done at his PCP which showed [place] 0.3. He presents to the clinic alone today. He has had a low grade fever the past few night. In December he developed a cough, for which he took Flonase for a month followed by amoxicillin two weeks ago. A chest x-ray was normal. He notes swollen, hard glands in his neck. He has been fatigued and not exercising as much as he normally does. He has darkening of the skin in one of his left toes that has been present for a few months.   REVIEW OF SYSTEMS:   Constitutional: Denies fevers, chills or abnormal weight loss (+) fever, nightly (+) fatigue Eyes: Denies blurriness of vision Ears, nose, mouth, throat, and face: Denies mucositis or sore throat (+) swollen, hard glands in neck Respiratory: Denies dyspnea or wheezes (+) cough Cardiovascular: Denies palpitation, chest discomfort Gastrointestinal: Denies nausea, heartburn or change in bowel habits Skin: Denies abnormal skin rashes (+) darkening of left toe skin Lymphatics: Denies new lymphadenopathy or  easy bruising Neurological: Denies numbness, tingling or new weaknesses Behavioral/Psych: Mood is stable, no new changes  Extremities: No lower extremity edema All other systems were reviewed with the patient and are negative.  I have reviewed the past medical history, past surgical history, social history and family history with the patient and they are unchanged from previous note.  ALLERGIES:  is allergic to adhesive [tape] and latex.  MEDICATIONS:  Current Outpatient Medications  Medication Sig Dispense Refill  . aspirin 81 MG tablet Take 81 mg by mouth daily.    Marland Kitchen atorvastatin (LIPITOR) 80 MG tablet TAKE 1 TABLET BY MOUTH ONCE DAILY AT 6 PM 90 tablet 3  . Multiple Vitamin (MULTIVITAMIN) tablet Take 1 tablet by mouth daily.    . Multiple Vitamins-Minerals (PRESERVISION AREDS PO) Take 2 capsules by mouth daily.    . nitroGLYCERIN (NITROSTAT) 0.4 MG SL tablet Place 1 tablet (0.4 mg total) under the tongue every 5 (five) minutes as needed for chest pain. 25 tablet 3  . Omega-3 Fatty Acids (FISH OIL CONCENTRATE PO) Take 1 capsule by mouth daily.     . vardenafil (LEVITRA) 10 MG tablet Take 10 mg by mouth as directed.     No current facility-administered medications for this visit.     PHYSICAL EXAMINATION: ECOG PERFORMANCE STATUS: 1 - Symptomatic but completely ambulatory  Vitals:   03/17/18 1201  BP: 133/72  Pulse: 90  Resp: 18  Temp: 98.6 F (37 C)  SpO2: 100%   Filed Weights   03/17/18 1201  Weight: 170 lb  1.6 oz (77.2 kg)    GENERAL: alert, no distress and comfortable SKIN: skin color, texture, turgor are normal, no rashes or significant lesions EYES: normal, Conjunctiva are pink and non-injected, sclera clear OROPHARYNX: no exudate, no erythema and lips, buccal mucosa, and tongue normal  NECK: Bilateral cervical axillary lymphadenopathy LYMPH: no palpable lymphadenopathy in the cervical, axillary or inguinal LUNGS: clear to auscultation and percussion with normal  breathing effort HEART: regular rate & rhythm and no murmurs and no lower extremity edema ABDOMEN: abdomen soft, non-tender and normal bowel sounds MUSCULOSKELETAL: no cyanosis of digits and no clubbing  NEURO: alert & oriented x 3 with fluent speech, no focal motor/sensory deficits EXTREMITIES: No lower extremity edema  LABORATORY DATA:  I have reviewed the data as listed CMP Latest Ref Rng & Units 12/25/2017 12/08/2017 11/11/2017  Glucose 70 - 99 mg/dL 98 - -  BUN 8 - 23 mg/dL 29(H) - -  Creatinine 0.61 - 1.24 mg/dL 0.89 - -  Sodium 135 - 145 mmol/L 139 - -  Potassium 3.5 - 5.1 mmol/L 4.5 - -  Chloride 98 - 111 mmol/L 103 - -  CO2 22 - 32 mmol/L 27 - -  Calcium 8.9 - 10.3 mg/dL 9.0 - -  Total Protein 6.5 - 8.1 g/dL 6.3(L) 5.9(L) 5.7(L)  Total Bilirubin 0.3 - 1.2 mg/dL 0.7 0.5 0.7  Alkaline Phos 38 - 126 U/L 76 62 70  AST 15 - 41 U/L _0 ALT 0 - 44 U/L 21 25 56(H)    Lab Results  Component Value Date   WBC 14.6 (H) 12/30/2017   HGB 11.1 (L) 12/30/2017   HCT 34.0 (L) 12/30/2017   MCV 117.6 (H) 12/30/2017   PLT 150 12/30/2017   NEUTROABS 0.9 (L) 12/30/2017    ASSESSMENT & PLAN:  Chronic lymphocytic leukemia (CLL), B-cell (HCC) CLL stage I with lymphocytosis and lymphadenopathy  Bone marrow biopsy November 2019: CLL without any evidence of transformation.  11:14 translocation negative.  Recent rapid increase in lymphadenopathy with fatigue, low-grade fevers and multiple antibiotic treatments  Plan: 1.  Recheck blood work today CBC differential, CMP and LDH 2.  CLL FISH panel to see if he has p53 gene mutation  I will request Dr.Kale to see the patient for treatment decisions.    Orders Placed This Encounter  Procedures  . CBC with Differential (Cancer Center Only)    Standing Status:   Future    Standing Expiration Date:   03/18/2019  . CMP ([place] only)    Standing Status:   Future    Standing Expiration Date:   03/18/2019  . Lactate dehydrogenase  (LDH)    Standing Status:   Future    Standing Expiration Date:   03/17/2019  . FISH, CLL Prognostic Panel    Standing Status:   Future    Standing Expiration Date:   03/18/2019   The patient has a good understanding of the overall plan. he agrees with it. he will call with any problems that may develop before the next visit here.  Nicholas Lose, MD 03/17/2018  Julious Oka Dorshimer am acting as scribe for Dr. Nicholas Lose.  I have reviewed the above documentation for accuracy and completeness, and I agree with the above.

## 2018-03-18 ENCOUNTER — Encounter: Payer: Self-pay | Admitting: Hematology and Oncology

## 2018-03-19 ENCOUNTER — Telehealth: Payer: Self-pay | Admitting: Adult Health

## 2018-03-19 ENCOUNTER — Other Ambulatory Visit: Payer: Self-pay | Admitting: Adult Health

## 2018-03-19 ENCOUNTER — Other Ambulatory Visit: Payer: Self-pay | Admitting: *Deleted

## 2018-03-19 ENCOUNTER — Telehealth: Payer: Self-pay | Admitting: *Deleted

## 2018-03-19 ENCOUNTER — Ambulatory Visit (HOSPITAL_COMMUNITY)
Admission: RE | Admit: 2018-03-19 | Discharge: 2018-03-19 | Disposition: A | Discharge status: Home / Self Care | Payer: BLUE CROSS/BLUE SHIELD | Source: Ambulatory Visit | Attending: Hematology and Oncology | Admitting: Hematology and Oncology

## 2018-03-19 DIAGNOSIS — C9110 Chronic lymphocytic leukemia of B-cell type not having achieved remission: Secondary | ICD-10-CM

## 2018-03-19 DIAGNOSIS — J984 Other disorders of lung: Secondary | ICD-10-CM | POA: Diagnosis not present

## 2018-03-19 DIAGNOSIS — R590 Localized enlarged lymph nodes: Secondary | ICD-10-CM | POA: Diagnosis not present

## 2018-03-19 MED ORDER — SODIUM CHLORIDE (PF) 0.9 % IJ SOLN
INTRAMUSCULAR | Status: AC
  Filled 2018-03-19: qty 50

## 2018-03-19 MED ORDER — IOHEXOL 300 MG/ML  SOLN
100.0000 mL | Freq: Once | INTRAMUSCULAR | Status: AC | PRN
  Administered 2018-03-19: 100 mL via INTRAVENOUS

## 2018-03-19 NOTE — Telephone Encounter (Signed)
Spoke with patient about his scans today.  Reviewed CT chest, abdomen, pelvis and neck with patient in detail.  He requested I fax his scans to his PCP, Dr. [name].  Scans given to Jake Pel, RN to fax.    Patient notes he is having pain in his right lower back. He is concerned about his pain.  I offered him a symptom management appointment tomorrow afternoon with Jake Mealy PA-C.  I reviewed with him the role of the symptom management clinic and than Jake Kennedy would be happy to meet with him, review his scans, and hopefully be able to help treat his pain prior to him getting in with Dr. Irene Limbo on 03/25/2018.    Patient would like to see Jake Kennedy tomorrow afternoon.  Schedule message sent.  Jake Bihari, NP

## 2018-03-19 NOTE — Progress Notes (Signed)
Pt requested to have his CT report faxed to Dr.Griffin's office. Will fax once ct resulted.

## 2018-03-19 NOTE — Telephone Encounter (Signed)
Results from recent CT's were faxed to Dr [name] per [facility]/NP discussion and review of results with pt.

## 2018-03-20 ENCOUNTER — Telehealth: Payer: Self-pay | Admitting: Emergency Medicine

## 2018-03-20 ENCOUNTER — Other Ambulatory Visit: Payer: BLUE CROSS/BLUE SHIELD

## 2018-03-20 ENCOUNTER — Inpatient Hospital Stay (HOSPITAL_BASED_OUTPATIENT_CLINIC_OR_DEPARTMENT_OTHER): Payer: BLUE CROSS/BLUE SHIELD | Admitting: Medical

## 2018-03-20 VITALS — BP 102/73 | HR 58 | Temp 97.9°F | Resp 18 | Ht 74.0 in | Wt 170.0 lb

## 2018-03-20 DIAGNOSIS — C9110 Chronic lymphocytic leukemia of B-cell type not having achieved remission: Secondary | ICD-10-CM

## 2018-03-20 DIAGNOSIS — Z79899 Other long term (current) drug therapy: Secondary | ICD-10-CM

## 2018-03-20 DIAGNOSIS — D649 Anemia, unspecified: Secondary | ICD-10-CM

## 2018-03-20 DIAGNOSIS — R591 Generalized enlarged lymph nodes: Secondary | ICD-10-CM | POA: Diagnosis not present

## 2018-03-20 DIAGNOSIS — E041 Nontoxic single thyroid nodule: Secondary | ICD-10-CM

## 2018-03-20 DIAGNOSIS — Z85828 Personal history of other malignant neoplasm of skin: Secondary | ICD-10-CM | POA: Diagnosis not present

## 2018-03-20 DIAGNOSIS — Z7982 Long term (current) use of aspirin: Secondary | ICD-10-CM

## 2018-03-20 DIAGNOSIS — N400 Enlarged prostate without lower urinary tract symptoms: Secondary | ICD-10-CM | POA: Diagnosis not present

## 2018-03-20 MED ORDER — HYDROCODONE-ACETAMINOPHEN 5-325 MG PO TABS: 1.0000 | ORAL_TABLET | Freq: Four times a day (QID) | ORAL | 0 refills | Status: DC | PRN

## 2018-03-20 NOTE — Telephone Encounter (Signed)
Returning pt's VM asking why he was getting labs today with [facility] appt.  At this time pt does not need labs per PA Van VO d/t recent lab draw.  PA Lucianne Lei will assess at [facility] appt whether he does need to have his labs checked today.  Pt verbalized understanding.

## 2018-03-23 ENCOUNTER — Encounter: Payer: Self-pay | Admitting: Hematology and Oncology

## 2018-03-23 NOTE — Progress Notes (Signed)
[facility] Progress Note   Jake Kennedy [id number] 04-18-1950 68 y.o.  Jake Kennedy is managed by Dr. Irene Limbo  Actively treated with chemotherapy/immunotherapy/hormonal therapy: no   Assessment: Plan:    Chronic lymphocytic leukemia of B-cell type not having achieved remission ([place]) - Plan: [place] CORE BIOPSY (LYMPH NODES)  Lymphadenopathy - Plan: [place] CORE BIOPSY (LYMPH NODES)   Chronic lymphocytic leukemia now with widespread diffuse lymphadenopathy: The patient was seen today with a report of right hip pain.  He is to be seen by Dr. Irene Limbo on 03/25/2018.  He is previously been seen by Dr. Nicholas Lose.  He has been referred for a core biopsy of an accessible lymph node.  He was given a prescription for Norco 5/325 with instructions that he can use 1/2-1 every 6 hours as needed for pain.  Please see After Visit Summary for patient specific instructions.  Future Appointments  Date Time Provider [place]  03/25/2018  8:40 AM Brunetta Genera, MD [facility] None  04/08/2018  1:30 PM [facility] None  04/08/2018  2:00 PM Nicholas Lose, MD [facility] None    Orders Placed This Encounter  Procedures  . [place] CORE BIOPSY (LYMPH NODES)       Subjective:   Patient ID:  Jake Kennedy is a 68 y.o. (DOB Sep 08, 1950) male.  Chief Complaint:  Chief Complaint  Patient presents with  . Flank Pain    HPI Jake Kennedy is a 68 year old male who is previously been followed by Dr. Nicholas Lose for a RAI stage 0 chronic lymphocytic leukemia.  He has ongoing fatigue, stopped appears, nose and throat drainage, swelling of his left neck, right hip and flank pain since Tuesday night.  He reports that it is painful to walk or move.  He reports that he is comfortable lying on his stomach or on his left side.  He is scheduled to see Dr. Irene Limbo on 03/25/2018 has he has recently developed widespread lymphadenopathy.  He has not had a biopsy.  He denies nausea,  vomiting, diarrhea, chest pain, shortness of breath, fevers, chills, bowel or bladder changes.  Medications: I have reviewed the patient's current medications.  Allergies:  Allergies  Allergen Reactions  . Adhesive [Tape]     Blisters & hives - Paper tape ok  . Latex Itching    Blisters and itching    Past Medical History:  Diagnosis Date  . Basal cell carcinoma    moles removed in 2007 and 2012 from L nose and L hip  . BPH (benign prostatic hypertrophy)   . CAD (coronary artery disease), native coronary artery    a. 02/10/2015 95% mid LAD stenosis s/p DES  b. 07/15/16: LHC showed stable non obst dz and patent stent  . CLL (chronic lymphocytic leukemia) ([place])   . ED (erectile dysfunction)   . Lymphocytosis 02/11/2015   seen by Dr. Lindi Adie 02/11/2015, working up for possible CLL    Past Surgical History:  Procedure Laterality Date  . CARDIAC CATHETERIZATION  02/10/2015  . CARDIAC CATHETERIZATION N/A 02/10/2015   Procedure: Left Heart Cath and Coronary Angiography;  Surgeon: Sherren Mocha, MD;  Location: [place] [facility];  Service: Cardiovascular;  Laterality: N/A;  . CARDIAC CATHETERIZATION N/A 02/10/2015   Procedure: Coronary Stent Intervention;  Surgeon: Sherren Mocha, MD;  Location: [place] [facility];  Service: Cardiovascular;  Laterality: N/A;  . CORONARY STENT PLACEMENT  02/10/2015   DES to LAD  . LAMINECTOMY    .  LEFT HEART CATH AND CORONARY ANGIOGRAPHY N/A 07/15/2016   Procedure: Left Heart Cath and Coronary Angiography;  Surgeon: Martinique, Peter M, MD;  Location: [place] [facility];  Service: Cardiovascular;  Laterality: N/A;  . TONSILLECTOMY      Family History  Problem Relation Age of Onset  . Diabetes Mother   . Heart failure Mother   . Heart attack Mother        occured in 11s.   . Healthy Sister   . Healthy Brother   . Healthy Maternal Grandmother   . Healthy Maternal Grandfather   . Healthy Paternal Grandmother   . Healthy Paternal Grandfather   . Healthy  Brother     Social History   Socioeconomic History  . Marital status: Married    Spouse name: Not on file  . Number of children: Not on file  . Years of education: Not on file  . Highest education level: Not on file  Occupational History  . Not on file  Social Needs  . Financial resource strain: Not on file  . Food insecurity:    Worry: Not on file    Inability: Not on file  . Transportation needs:    Medical: Not on file    Non-medical: Not on file  Tobacco Use  . Smoking status: Never Smoker  . Smokeless tobacco: Never Used  Substance and Sexual Activity  . Alcohol use: Yes    Alcohol/week: 0.0 standard drinks    Comment: occasional  . Drug use: No  . Sexual activity: Not on file  Lifestyle  . Physical activity:    Days per week: Not on file    Minutes per session: Not on file  . Stress: Not on file  Relationships  . Social connections:    Talks on phone: Not on file    Gets together: Not on file    Attends religious service: Not on file    Active member of club or organization: Not on file    Attends meetings of clubs or organizations: Not on file    Relationship status: Not on file  . Intimate partner violence:    Fear of current or ex partner: Not on file    Emotionally abused: Not on file    Physically abused: Not on file    Forced sexual activity: Not on file  Other Topics Concern  . Not on file  Social History Narrative  . Not on file    Past Medical History, Surgical history, Social history, and Family history were reviewed and updated as appropriate.   Please see review of systems for further details on the patient's review from today.   Review of Systems:  Review of Systems  Constitutional: Negative for chills, diaphoresis and fever.  HENT: Positive for congestion, postnasal drip and rhinorrhea. Negative for trouble swallowing and voice change.        Ear fullness Swelling of the left neck  Respiratory: Negative for cough, chest tightness,  shortness of breath and wheezing.   Cardiovascular: Negative for chest pain and palpitations.  Gastrointestinal: Negative for abdominal pain, constipation, diarrhea, nausea and vomiting.  Genitourinary: Negative for difficulty urinating.  Musculoskeletal: Positive for arthralgias. Negative for back pain and myalgias.  Neurological: Negative for dizziness, light-headedness and headaches.  Hematological: Positive for adenopathy.    Objective:   Physical Exam:  BP 102/73 (BP Location: Left Arm, Patient Position: Sitting)   Pulse (!) 58   Temp 97.9 F (36.6 C) (Oral)  Resp 18   Ht 6\' 2"  (1.88 m)   Wt 170 lb (77.1 kg)   SpO2 100%   BMI 21.83 kg/m  ECOG: 1  Physical Exam Constitutional:      General: He is not in acute distress.    Appearance: He is not diaphoretic.  HENT:     Head: Normocephalic and atraumatic.     Right Ear: Tympanic membrane, ear canal and external ear normal.     Left Ear: Tympanic membrane, ear canal and external ear normal.     Mouth/Throat:     Mouth: Mucous membranes are moist.     Pharynx: Oropharynx is clear. No oropharyngeal exudate or posterior oropharyngeal erythema.  Eyes:     General: No scleral icterus.       Right eye: No discharge.        Left eye: No discharge.     Conjunctiva/sclera: Conjunctivae normal.  Cardiovascular:     Rate and Rhythm: Normal rate and regular rhythm.     Heart sounds: Normal heart sounds. No murmur. No friction rub. No gallop.   Pulmonary:     Effort: Pulmonary effort is normal. No respiratory distress.     Breath sounds: Normal breath sounds. No wheezing or rales.  Abdominal:     General: Bowel sounds are normal. There is no distension.     Palpations: There is no mass.     Tenderness: There is no abdominal tenderness. There is no guarding.  Lymphadenopathy:     Head:     Right side of head: Submandibular, posterior auricular and occipital adenopathy present.     Left side of head: Submandibular, posterior  auricular and occipital adenopathy present.     Cervical: Cervical adenopathy present.     Right cervical: Superficial cervical adenopathy, deep cervical adenopathy and posterior cervical adenopathy present.     Left cervical: Superficial cervical adenopathy, deep cervical adenopathy and posterior cervical adenopathy present.     Upper Body:     Right upper body: Supraclavicular adenopathy and axillary adenopathy present.     Left upper body: Supraclavicular adenopathy and axillary adenopathy present.     Lower Body: Right inguinal adenopathy present. Left inguinal adenopathy present.  Skin:    General: Skin is warm and dry.     Findings: No erythema or rash.  Neurological:     Mental Status: He is alert.     Comments: Negative straight leg raise bilaterally. No tenderness to palpation over the sciatic notches bilaterally.     Lab Review:     Component Value Date/Time   NA 139 03/17/2018 1237   NA 144 07/11/2016 1503   NA 141 02/28/2015 1444   K 5.1 03/17/2018 1237   K 4.5 02/28/2015 1444   CL 104 03/17/2018 1237   CO2 28 03/17/2018 1237   CO2 29 02/28/2015 1444   GLUCOSE 90 03/17/2018 1237   GLUCOSE 97 02/28/2015 1444   BUN 31 (H) 03/17/2018 1237   BUN 21 07/11/2016 1503   BUN 16.1 02/28/2015 1444   CREATININE 0.87 03/17/2018 1237   CREATININE 1.0 02/28/2015 1444   CALCIUM 8.7 (L) 03/17/2018 1237   CALCIUM 8.9 02/28/2015 1444   PROT 6.2 (L) 03/17/2018 1237   PROT 5.9 (L) 12/08/2017 1505   PROT 6.6 02/28/2015 1444   ALBUMIN 3.4 (L) 03/17/2018 1237   ALBUMIN 4.0 12/08/2017 1505   ALBUMIN 4.1 02/28/2015 1444   AST 23 03/17/2018 1237   AST 19 02/28/2015 1444   ALT  31 03/17/2018 1237   ALT 27 02/28/2015 1444   ALKPHOS 84 03/17/2018 1237   ALKPHOS 62 02/28/2015 1444   BILITOT 0.7 03/17/2018 1237   BILITOT 0.66 02/28/2015 1444   GFRNONAA >60 03/17/2018 1237   GFRAA >60 03/17/2018 1237       Component Value Date/Time   WBC 13.1 (H) 03/17/2018 1237   WBC 15.1 (H)  07/14/2016 0406   RBC 2.67 (L) 03/17/2018 1237   HGB 10.1 (L) 03/17/2018 1237   HGB 15.7 07/11/2016 1503   HGB 15.1 09/25/2015 1119   HCT 31.0 (L) 03/17/2018 1237   HCT 47.3 07/11/2016 1503   HCT 46.7 09/25/2015 1119   PLT 103 (L) 03/17/2018 1237   PLT 171 07/11/2016 1503   MCV 116.1 (H) 03/17/2018 1237   MCV 96 07/11/2016 1503   MCV 92.9 09/25/2015 1119   MCH 37.8 (H) 03/17/2018 1237   MCHC 32.6 03/17/2018 1237   RDW 15.3 03/17/2018 1237   RDW 14.1 07/11/2016 1503   RDW 14.3 09/25/2015 1119   LYMPHSABS 11.1 (H) 03/17/2018 1237   LYMPHSABS 9.1 (H) 09/25/2015 1119   MONOABS 1.0 03/17/2018 1237   MONOABS 0.8 09/25/2015 1119   EOSABS 0.0 03/17/2018 1237   EOSABS 0.1 09/25/2015 1119   BASOSABS 0.0 03/17/2018 1237   BASOSABS 0.1 09/25/2015 1119   -------------------------------  Imaging from last 24 hours (if applicable):  Radiology interpretation: Dg Chest 2 View  Result Date: 03/06/2018 CLINICAL DATA:  Cough and congestion for several weeks. EXAM: CHEST - 2 VIEW COMPARISON:  July 13, 2016 FINDINGS: The heart size and mediastinal contours are within normal limits. Both lungs are clear. The visualized skeletal structures are unremarkable. IMPRESSION: No active cardiopulmonary disease. Electronically Signed   By: Abelardo Diesel M.D.   On: 03/06/2018 10:42   Ct Soft Tissue Neck W Contrast  Result Date: 03/19/2018 CLINICAL DATA:  Chronic lymphocytic leukemia. Localized enlarged lymph nodes. EXAM: CT NECK WITH CONTRAST TECHNIQUE: Multidetector CT imaging of the neck was performed using the standard protocol following the bolus administration of intravenous contrast. CONTRAST:  155mL OMNIPAQUE IOHEXOL 300 MG/ML  SOLN COMPARISON:  None. FINDINGS: Pharynx and larynx: No focal mucosal or submucosal lesions are present. Nasopharynx is clear. Soft palate is within normal limits. Tongue base and palatine tonsils are normal. Oropharynx and hypopharynx are clear. Epiglottis is within normal limits.  Vocal cords are midline and symmetric. Salivary glands: The submandibular and parotid glands are normal bilaterally. Thyroid: A 12 mm nodule is present at the lower pole of the left lobe of the thyroid. No other significant nodules are present. Lymph nodes: Multiple bilateral enlarged cervical nodes are present. Submandibular nodes measure up to 2.1 cm on the left and 1.4 cm on the right. A 2.2 cm left level 2 lymph node is present. A 2.1 cm right level 2 lymph node is present. The largest node is a right level 3 node measuring 3.4 x 2.8 x 1.2 cm. Left level 2 node measures 2.1 cm. Numerous smaller level 4, posterior triangle, and supraclavicular nodes are present bilaterally. Vascular: Negative. Limited intracranial: Normal Mastoids and visualized paranasal sinuses: Circumferential mucosal thickening is present in the right maxillary sinus. The visualized left maxillary sinus is clear. Mastoid air cells are clear bilaterally. Skeleton: Endplate degenerative changes are most notable at C3-4, C5-6, and C6-7. Upper chest: Lung apices are degraded by patient motion. No focal lesions are evident. Other: A hypodense lesion in the right side of the tongue measures 1.5 x 1.1  cm. IMPRESSION: 1. Multiple enlarged bilateral cervical lymph nodes consistent with the given diagnosis of CLL. The largest node is a right level 3 lymph node measuring 3.4 x 2.8 x 1.2 cm. 2. Degenerative changes of the cervical spine. 3. Hypodense lesion in the right side of the tongue measuring up to 1.5 cm. This could represent focal infection. Neoplasm is considered less likely. The lesion appears to be superficial along the right lateral aspect of the anterior tongue a. This should be amenable to direct inspection. Electronically Signed   By: San Morelle M.D.   On: 03/19/2018 14:29   Ct Chest W Contrast  Result Date: 03/19/2018 CLINICAL DATA:  Chronic lymphocytic leukemia. EXAM: CT CHEST, ABDOMEN, AND PELVIS WITH CONTRAST TECHNIQUE:  Multidetector CT imaging of the chest, abdomen and pelvis was performed following the standard protocol during bolus administration of intravenous contrast. CONTRAST:  124mL OMNIPAQUE IOHEXOL 300 MG/ML  SOLN COMPARISON:  Chest radiograph of 03/06/2018.  No prior CTs. FINDINGS: CT CHEST FINDINGS Cardiovascular: Aortic atherosclerosis. Normal heart size, without pericardial effusion. Lad coronary stent. Right coronary artery calcification. No central pulmonary embolism, on this non-dedicated study. Mediastinum/Nodes: Bilateral axillary adenopathy, as evidenced by enlarged and increased number of nodes. Example left axillary node of 2.3 cm on image 21/3. Right paratracheal index node of 1.6 cm on image 24/3. Bilateral hilar adenopathy, including a left hilar node of 1.4 cm. Lungs/Pleura: No pleural fluid. Right greater than left and lower lobe predominant peribronchovascular interstitial thickening and nodularity. Underlying pattern of "tree-in-bud" nodularity. Dominant 1.8 cm left lower lobe pulmonary nodule on image 117/7. Right upper lobe peribronchovascular nodule of 1.0 cm on image 39/7. Musculoskeletal: No acute osseous abnormality. CT ABDOMEN PELVIS FINDINGS Hepatobiliary: Hypoattenuation in segment 4 is likely related to focal hepatic steatosis. Normal gallbladder, without biliary ductal dilatation. Pancreas: Normal, without mass or ductal dilatation. Spleen: Moderate splenomegaly at 14.3 cm craniocaudal. Transverse maximal dimensions 15.0 x 7.6 cm. (Volume = 850) . Adrenals/Urinary Tract: Normal adrenal glands. Normal kidneys, without hydronephrosis. Normal urinary bladder. Stomach/Bowel: Normal stomach, without wall thickening. Normal colon, appendix, and terminal ileum. Normal small bowel. Vascular/Lymphatic: Aortic atherosclerosis. Marked abdominal adenopathy. Left periaortic index node of 2.3 cm on image 78/3. Index ileocolic mesenteric node of 2.1 by 4.3 cm on image 106/3. Index right external iliac node  measures 2.1 x 4.5 cm on image 123/3. Reproductive: Mild prostatomegaly. Other: Trace free pelvic fluid is nonspecific. Musculoskeletal: No acute osseous abnormality. IMPRESSION: 1. Marked adenopathy within the chest, abdomen, and pelvis, consistent with active lymphoma/leukemia. 2. Splenomegaly is nonspecific. Splenic involvement cannot be excluded. 3. Right greater than left and basilar predominant peribronchovascular interstitial thickening and nodularity. Considerations include pulmonary involvement of lymphoma and/or infection/aspiration. 4. Coronary artery atherosclerosis. Aortic Atherosclerosis (ICD10-I70.0). 5. Trace nonspecific pelvic fluid. 6. Electronically Signed   By: Abigail Miyamoto M.D.   On: 03/19/2018 14:17   Ct Abdomen Pelvis W Contrast  Result Date: 03/19/2018 CLINICAL DATA:  Chronic lymphocytic leukemia. EXAM: CT CHEST, ABDOMEN, AND PELVIS WITH CONTRAST TECHNIQUE: Multidetector CT imaging of the chest, abdomen and pelvis was performed following the standard protocol during bolus administration of intravenous contrast. CONTRAST:  193mL OMNIPAQUE IOHEXOL 300 MG/ML  SOLN COMPARISON:  Chest radiograph of 03/06/2018.  No prior CTs. FINDINGS: CT CHEST FINDINGS Cardiovascular: Aortic atherosclerosis. Normal heart size, without pericardial effusion. Lad coronary stent. Right coronary artery calcification. No central pulmonary embolism, on this non-dedicated study. Mediastinum/Nodes: Bilateral axillary adenopathy, as evidenced by enlarged and increased number of nodes. Example left  axillary node of 2.3 cm on image 21/3. Right paratracheal index node of 1.6 cm on image 24/3. Bilateral hilar adenopathy, including a left hilar node of 1.4 cm. Lungs/Pleura: No pleural fluid. Right greater than left and lower lobe predominant peribronchovascular interstitial thickening and nodularity. Underlying pattern of "tree-in-bud" nodularity. Dominant 1.8 cm left lower lobe pulmonary nodule on image 117/7. Right upper  lobe peribronchovascular nodule of 1.0 cm on image 39/7. Musculoskeletal: No acute osseous abnormality. CT ABDOMEN PELVIS FINDINGS Hepatobiliary: Hypoattenuation in segment 4 is likely related to focal hepatic steatosis. Normal gallbladder, without biliary ductal dilatation. Pancreas: Normal, without mass or ductal dilatation. Spleen: Moderate splenomegaly at 14.3 cm craniocaudal. Transverse maximal dimensions 15.0 x 7.6 cm. (Volume = 850) . Adrenals/Urinary Tract: Normal adrenal glands. Normal kidneys, without hydronephrosis. Normal urinary bladder. Stomach/Bowel: Normal stomach, without wall thickening. Normal colon, appendix, and terminal ileum. Normal small bowel. Vascular/Lymphatic: Aortic atherosclerosis. Marked abdominal adenopathy. Left periaortic index node of 2.3 cm on image 78/3. Index ileocolic mesenteric node of 2.1 by 4.3 cm on image 106/3. Index right external iliac node measures 2.1 x 4.5 cm on image 123/3. Reproductive: Mild prostatomegaly. Other: Trace free pelvic fluid is nonspecific. Musculoskeletal: No acute osseous abnormality. IMPRESSION: 1. Marked adenopathy within the chest, abdomen, and pelvis, consistent with active lymphoma/leukemia. 2. Splenomegaly is nonspecific. Splenic involvement cannot be excluded. 3. Right greater than left and basilar predominant peribronchovascular interstitial thickening and nodularity. Considerations include pulmonary involvement of lymphoma and/or infection/aspiration. 4. Coronary artery atherosclerosis. Aortic Atherosclerosis (ICD10-I70.0). 5. Trace nonspecific pelvic fluid. 6. Electronically Signed   By: Abigail Miyamoto M.D.   On: 03/19/2018 14:17        This case was discussed with Dr. Irene Limbo. He expresses agreement with my management of this patient.

## 2018-03-24 ENCOUNTER — Ambulatory Visit (HOSPITAL_COMMUNITY): Payer: BLUE CROSS/BLUE SHIELD

## 2018-03-24 ENCOUNTER — Ambulatory Visit (HOSPITAL_COMMUNITY): Admission: RE | Admit: 2018-03-24 | Payer: BLUE CROSS/BLUE SHIELD | Source: Ambulatory Visit

## 2018-03-24 NOTE — Progress Notes (Signed)
HEMATOLOGY/ONCOLOGY CLINIC NOTE  Date of Service: 03/25/2018  Patient Care Team: [name], MD as PCP - General (Internal Medicine) Sherren Mocha, MD as PCP - Cardiology (Cardiology)  CHIEF COMPLAINTS/PURPOSE OF CONSULTATION:  Chronic Lymphocytic Leukemia  Oncologic History:     Chronic lymphocytic leukemia (CLL), B-cell ([place])   02/14/2015 Initial Diagnosis    Monoclonal B-cell population CD5 and CD20 positive, differential diagnosis CLL versus mantle cell lymphoma ( patient had absolute lymphocyte count of 8000 in 2010), remained stable.      HISTORY OF PRESENTING ILLNESS:  Jake Kennedy is a wonderful 68 y.o. male who has been referred to [place] by Dr. [name] for evaluation and management of Chronic Lymphocytic Leukemia. The pt was formerly under the care of my colleague Dr. Nicholas Lose. He is accompanied today by his wife. The pt reports that he is doing well overall.  The pt reports that his course of CLL had been pretty consistent since diagnosis in 2017, until roughly 3 months ago in roughly November 2019. He notes that he has had recent increase in fatigue in the last 3 months, and more acutely in the last 3 weeks. He endorses occasional fevers over the last two weeks during the night, highest at 100.2 or 100.3. He also endorses some night sweats, not every night, some of these being "drenching." He notes that he has been eating well for the last 6 months, however has lost 8 pounds in that interim. He endorses regular exercising until the past month, due to his worsened fatigue, and had previously regularly run upwards of 3 miles and bike for several miles as well.  The pt notes that he can't smell very well, in the last 2-3 weeks, and his hearing has also reduced in the last week. He senses that his enlarged lymph nodes are pressing on his neck, and characterizes this as similar to when one has an acute cold and congestion. He had acute pain in his right hip a week ago  and saw my colleague Sandi Mealy, PA in Symptom Managment. He was given Norco but denies taking this because he didn't want to. He endorses a positional element to his hip pain, which e attributes to the pain being from an enlarged lymph node. He notes that his hip pain had sudden onset on the night of 03/18/18. He notes that his neck lymph nodes have grown in the last 3 weeks to a month. The pt notes that his lymph nodes in his neck, armpits, and groin have been enlarged.  The pt tried an antibiotic course 2 weeks ago with his PCP for a suspected sinus infection, during which his cervical lymph nodes enlarged further. He notes that his neck lymph nodes are somewhat improved today as compared to this. The pt denies frequent infections.   Of note prior to the patient's visit today, pt has had CT N/C/A/P completed on 03/19/18 with results revealing NECK: Multiple enlarged bilateral cervical lymph nodes consistent with the given diagnosis of CLL. The largest node is a right level 3 lymph node measuring 3.4 x 2.8 x 1.2 cm. 2. Degenerative changes of the cervical spine. 3. Hypodense lesion in the right side of the tongue measuring up to 1.5 cm. This could represent focal infection. Neoplasm is considered less likely. The lesion appears to be superficial along the right lateral aspect of the anterior tongue a. This should be amenable to direct inspection, C/A/P: Marked adenopathy within the chest, abdomen, and pelvis, consistent  with active lymphoma/leukemia. 2. Splenomegaly is nonspecific. Splenic involvement cannot be excluded. 3. Right greater than left and basilar predominant peribronchovascular interstitial thickening and nodularity. Considerations include pulmonary involvement of lymphoma and/or Infection/aspiration. 4. Coronary artery atherosclerosis. Aortic Atherosclerosis. 5. Trace nonspecific pelvic fluid.  Most recent lab results (03/17/18) of CBC w/diff and CMP is as follows: all values are WNL except for  WBC at 13.1k, RBC at 2.67, HGB at 10.1, HCT at 31.0, MCV at 116.1, MCH at 37.8, PLT at 103k, ANC at 900, Lymphs abs at 11.1k, BUN at 31, Calcium at 8.7, Total Protein at 6.2, Albumin at 3.4. 03/17/18 LDH at 241  On review of systems, pt reports new fatigue, recent fevers, recent night sweats, recently grown neck lymph nodes, eating well, some weight loss, right groin pain, and denies chills, SOB, ear pain, pain along the spine, frequent infections, flank pain, leg swelling, testicular pain or swelling, and any other symptoms.  MEDICAL HISTORY:  Past Medical History:  Diagnosis Date  . Basal cell carcinoma    moles removed in 2007 and 2012 from L nose and L hip  . BPH (benign prostatic hypertrophy)   . CAD (coronary artery disease), native coronary artery    a. 02/10/2015 95% mid LAD stenosis s/p DES  b. 07/15/16: LHC showed stable non obst dz and patent stent  . CLL (chronic lymphocytic leukemia) ([place])   . ED (erectile dysfunction)   . Lymphocytosis 02/11/2015   seen by Dr. Lindi Adie 02/11/2015, working up for possible CLL    SURGICAL HISTORY: Past Surgical History:  Procedure Laterality Date  . CARDIAC CATHETERIZATION  02/10/2015  . CARDIAC CATHETERIZATION N/A 02/10/2015   Procedure: Left Heart Cath and Coronary Angiography;  Surgeon: Sherren Mocha, MD;  Location: [facility];  Service: Cardiovascular;  Laterality: N/A;  . CARDIAC CATHETERIZATION N/A 02/10/2015   Procedure: Coronary Stent Intervention;  Surgeon: Sherren Mocha, MD;  Location: [facility];  Service: Cardiovascular;  Laterality: N/A;  . CORONARY STENT PLACEMENT  02/10/2015   DES to LAD  . LAMINECTOMY    . LEFT HEART CATH AND CORONARY ANGIOGRAPHY N/A 07/15/2016   Procedure: Left Heart Cath and Coronary Angiography;  Surgeon: Martinique, Peter M, MD;  Location: [facility];  Service: Cardiovascular;  Laterality: N/A;  . TONSILLECTOMY      SOCIAL HISTORY: Social History   Socioeconomic History  . Marital status:  Married    Spouse name: Not on file  . Number of children: Not on file  . Years of education: Not on file  . Highest education level: Not on file  Occupational History  . Not on file  Social Needs  . Financial resource strain: Not on file  . Food insecurity:    Worry: Not on file    Inability: Not on file  . Transportation needs:    Medical: Not on file    Non-medical: Not on file  Tobacco Use  . Smoking status: Never Smoker  . Smokeless tobacco: Never Used  Substance and Sexual Activity  . Alcohol use: Yes    Alcohol/week: 0.0 standard drinks    Comment: occasional  . Drug use: No  . Sexual activity: Not on file  Lifestyle  . Physical activity:    Days per week: Not on file    Minutes per session: Not on file  . Stress: Not on file  Relationships  . Social connections:    Talks on phone: Not on file    Gets  together: Not on file    Attends religious service: Not on file    Active member of club or organization: Not on file    Attends meetings of clubs or organizations: Not on file    Relationship status: Not on file  . Intimate partner violence:    Fear of current or ex partner: Not on file    Emotionally abused: Not on file    Physically abused: Not on file    Forced sexual activity: Not on file  Other Topics Concern  . Not on file  Social History Narrative  . Not on file    FAMILY HISTORY: Family History  Problem Relation Age of Onset  . Diabetes Mother   . Heart failure Mother   . Heart attack Mother        occured in 67s.   . Healthy Sister   . Healthy Brother   . Healthy Maternal Grandmother   . Healthy Maternal Grandfather   . Healthy Paternal Grandmother   . Healthy Paternal Grandfather   . Healthy Brother     ALLERGIES:  is allergic to adhesive [tape] and latex.  MEDICATIONS:  Current Outpatient Medications  Medication Sig Dispense Refill  . aspirin 81 MG tablet Take 81 mg by mouth daily.    Marland Kitchen atorvastatin (LIPITOR) 80 MG tablet TAKE 1  TABLET BY MOUTH ONCE DAILY AT 6 PM 90 tablet 3  . HYDROcodone-acetaminophen (NORCO/VICODIN) 5-325 MG tablet Take 1 tablet by mouth every 6 (six) hours as needed for moderate pain. Take 1/2 to 1 tablet Q 6 hours prn pain 30 tablet 0  . Multiple Vitamin (MULTIVITAMIN) tablet Take 1 tablet by mouth daily.    . Multiple Vitamins-Minerals (PRESERVISION AREDS PO) Take 2 capsules by mouth daily.    . nitroGLYCERIN (NITROSTAT) 0.4 MG SL tablet Place 1 tablet (0.4 mg total) under the tongue every 5 (five) minutes as needed for chest pain. 25 tablet 3  . Omega-3 Fatty Acids (FISH OIL CONCENTRATE PO) Take 1 capsule by mouth daily.     . predniSONE (DELTASONE) 50 MG tablet Take 1 tablet (50 mg total) by mouth daily with breakfast. 10 tablet 0  . vardenafil (LEVITRA) 10 MG tablet Take 10 mg by mouth as directed.     No current facility-administered medications for this visit.     REVIEW OF SYSTEMS:    10 Point review of Systems was done is negative except as noted above.  PHYSICAL EXAMINATION: ECOG PERFORMANCE STATUS: 1 - Symptomatic but completely ambulatory  . Vitals:   03/25/18 0844  BP: 102/66  Pulse: 74  Resp: 17  Temp: 97.8 F (36.6 C)  SpO2: 96%   Filed Weights   03/25/18 0844  Weight: 168 lb 1.6 oz (76.2 kg)   .Body mass index is 21.58 kg/m.  GENERAL:alert, in no acute distress and comfortable SKIN: no acute rashes, no significant lesions EYES: conjunctiva are pink and non-injected, sclera anicteric OROPHARYNX: MMM, no exudates, no oropharyngeal erythema or ulceration NECK: supple, no JVD LYMPH: Palpable 2cm lymph nodes in bilateral cervical and axilla.. Palpable right inguinal LN 3-4cm. Palpable 1-2cm lymph nodes in left inguinal. LUNGS: clear to auscultation b/l with normal respiratory effort HEART: regular rate & rhythm ABDOMEN:  normoactive bowel sounds , non tender, not distended. No palpable hepatomegaly. Palpable splenomegaly 1cm under left costal margin. Extremity: no  pedal edema PSYCH: alert & oriented x 3 with fluent speech NEURO: no focal motor/sensory deficits  LABORATORY DATA:  I have reviewed  the data as listed  . CBC Latest Ref Rng & Units 03/17/2018 12/30/2017 12/25/2017  WBC 4.0 - 10.5 K/uL 13.1(H) 14.6(H) 14.3(H)  Hemoglobin 13.0 - 17.0 g/dL 10.1(L) 11.1(L) 10.7(L)  Hematocrit 39.0 - 52.0 % 31.0(L) 34.0(L) 32.5(L)  Platelets 150 - 400 K/uL 103(L) 150 134(L)    . CMP Latest Ref Rng & Units 03/17/2018 12/25/2017 12/08/2017  Glucose 70 - 99 mg/dL 90 98 -  BUN 8 - 23 mg/dL 31(H) 29(H) -  Creatinine 0.61 - 1.24 mg/dL 0.87 0.89 -  Sodium 135 - 145 mmol/L 139 139 -  Potassium 3.5 - 5.1 mmol/L 5.1 4.5 -  Chloride 98 - 111 mmol/L 104 103 -  CO2 22 - 32 mmol/L 28 27 -  Calcium 8.9 - 10.3 mg/dL 8.7(L) 9.0 -  Total Protein 6.5 - 8.1 g/dL 6.2(L) 6.3(L) 5.9(L)  Total Bilirubin 0.3 - 1.2 mg/dL 0.7 0.7 0.5  Alkaline Phos 38 - 126 U/L 84 76 62  AST 15 - 41 U/L 23 23 21   ALT 0 - 44 U/L 31 21 25              RADIOGRAPHIC STUDIES: I have personally reviewed the radiological images as listed and agreed with the findings in the report. Dg Chest 2 View  Result Date: 03/06/2018 CLINICAL DATA:  Cough and congestion for several weeks. EXAM: CHEST - 2 VIEW COMPARISON:  July 13, 2016 FINDINGS: The heart size and mediastinal contours are within normal limits. Both lungs are clear. The visualized skeletal structures are unremarkable. IMPRESSION: No active cardiopulmonary disease. Electronically Signed   By: Abelardo Diesel M.D.   On: 03/06/2018 10:42   Ct Soft Tissue Neck W Contrast  Result Date: 03/19/2018 CLINICAL DATA:  Chronic lymphocytic leukemia. Localized enlarged lymph nodes. EXAM: CT NECK WITH CONTRAST TECHNIQUE: Multidetector CT imaging of the neck was performed using the standard protocol following the bolus administration of intravenous contrast. CONTRAST:  152mL OMNIPAQUE IOHEXOL 300 MG/ML  SOLN COMPARISON:  None. FINDINGS: Pharynx and larynx:  No focal mucosal or submucosal lesions are present. Nasopharynx is clear. Soft palate is within normal limits. Tongue base and palatine tonsils are normal. Oropharynx and hypopharynx are clear. Epiglottis is within normal limits. Vocal cords are midline and symmetric. Salivary glands: The submandibular and parotid glands are normal bilaterally. Thyroid: A 12 mm nodule is present at the lower pole of the left lobe of the thyroid. No other significant nodules are present. Lymph nodes: Multiple bilateral enlarged cervical nodes are present. Submandibular nodes measure up to 2.1 cm on the left and 1.4 cm on the right. A 2.2 cm left level 2 lymph node is present. A 2.1 cm right level 2 lymph node is present. The largest node is a right level 3 node measuring 3.4 x 2.8 x 1.2 cm. Left level 2 node measures 2.1 cm. Numerous smaller level 4, posterior triangle, and supraclavicular nodes are present bilaterally. Vascular: Negative. Limited intracranial: Normal Mastoids and visualized paranasal sinuses: Circumferential mucosal thickening is present in the right maxillary sinus. The visualized left maxillary sinus is clear. Mastoid air cells are clear bilaterally. Skeleton: Endplate degenerative changes are most notable at C3-4, C5-6, and C6-7. Upper chest: Lung apices are degraded by patient motion. No focal lesions are evident. Other: A hypodense lesion in the right side of the tongue measures 1.5 x 1.1 cm. IMPRESSION: 1. Multiple enlarged bilateral cervical lymph nodes consistent with the given diagnosis of CLL. The largest node is a right level 3 lymph node  measuring 3.4 x 2.8 x 1.2 cm. 2. Degenerative changes of the cervical spine. 3. Hypodense lesion in the right side of the tongue measuring up to 1.5 cm. This could represent focal infection. Neoplasm is considered less likely. The lesion appears to be superficial along the right lateral aspect of the anterior tongue a. This should be amenable to direct inspection.  Electronically Signed   By: San Morelle M.D.   On: 03/19/2018 14:29   Ct Chest W Contrast  Result Date: 03/19/2018 CLINICAL DATA:  Chronic lymphocytic leukemia. EXAM: CT CHEST, ABDOMEN, AND PELVIS WITH CONTRAST TECHNIQUE: Multidetector CT imaging of the chest, abdomen and pelvis was performed following the standard protocol during bolus administration of intravenous contrast. CONTRAST:  187mL OMNIPAQUE IOHEXOL 300 MG/ML  SOLN COMPARISON:  Chest radiograph of 03/06/2018.  No prior CTs. FINDINGS: CT CHEST FINDINGS Cardiovascular: Aortic atherosclerosis. Normal heart size, without pericardial effusion. Lad coronary stent. Right coronary artery calcification. No central pulmonary embolism, on this non-dedicated study. Mediastinum/Nodes: Bilateral axillary adenopathy, as evidenced by enlarged and increased number of nodes. Example left axillary node of 2.3 cm on image 21/3. Right paratracheal index node of 1.6 cm on image 24/3. Bilateral hilar adenopathy, including a left hilar node of 1.4 cm. Lungs/Pleura: No pleural fluid. Right greater than left and lower lobe predominant peribronchovascular interstitial thickening and nodularity. Underlying pattern of "tree-in-bud" nodularity. Dominant 1.8 cm left lower lobe pulmonary nodule on image 117/7. Right upper lobe peribronchovascular nodule of 1.0 cm on image 39/7. Musculoskeletal: No acute osseous abnormality. CT ABDOMEN PELVIS FINDINGS Hepatobiliary: Hypoattenuation in segment 4 is likely related to focal hepatic steatosis. Normal gallbladder, without biliary ductal dilatation. Pancreas: Normal, without mass or ductal dilatation. Spleen: Moderate splenomegaly at 14.3 cm craniocaudal. Transverse maximal dimensions 15.0 x 7.6 cm. (Volume = 850) . Adrenals/Urinary Tract: Normal adrenal glands. Normal kidneys, without hydronephrosis. Normal urinary bladder. Stomach/Bowel: Normal stomach, without wall thickening. Normal colon, appendix, and terminal ileum. Normal  small bowel. Vascular/Lymphatic: Aortic atherosclerosis. Marked abdominal adenopathy. Left periaortic index node of 2.3 cm on image 78/3. Index ileocolic mesenteric node of 2.1 by 4.3 cm on image 106/3. Index right external iliac node measures 2.1 x 4.5 cm on image 123/3. Reproductive: Mild prostatomegaly. Other: Trace free pelvic fluid is nonspecific. Musculoskeletal: No acute osseous abnormality. IMPRESSION: 1. Marked adenopathy within the chest, abdomen, and pelvis, consistent with active lymphoma/leukemia. 2. Splenomegaly is nonspecific. Splenic involvement cannot be excluded. 3. Right greater than left and basilar predominant peribronchovascular interstitial thickening and nodularity. Considerations include pulmonary involvement of lymphoma and/or infection/aspiration. 4. Coronary artery atherosclerosis. Aortic Atherosclerosis (ICD10-I70.0). 5. Trace nonspecific pelvic fluid. 6. Electronically Signed   By: Abigail Miyamoto M.D.   On: 03/19/2018 14:17   Ct Abdomen Pelvis W Contrast  Result Date: 03/19/2018 CLINICAL DATA:  Chronic lymphocytic leukemia. EXAM: CT CHEST, ABDOMEN, AND PELVIS WITH CONTRAST TECHNIQUE: Multidetector CT imaging of the chest, abdomen and pelvis was performed following the standard protocol during bolus administration of intravenous contrast. CONTRAST:  141mL OMNIPAQUE IOHEXOL 300 MG/ML  SOLN COMPARISON:  Chest radiograph of 03/06/2018.  No prior CTs. FINDINGS: CT CHEST FINDINGS Cardiovascular: Aortic atherosclerosis. Normal heart size, without pericardial effusion. Lad coronary stent. Right coronary artery calcification. No central pulmonary embolism, on this non-dedicated study. Mediastinum/Nodes: Bilateral axillary adenopathy, as evidenced by enlarged and increased number of nodes. Example left axillary node of 2.3 cm on image 21/3. Right paratracheal index node of 1.6 cm on image 24/3. Bilateral hilar adenopathy, including a left hilar node  of 1.4 cm. Lungs/Pleura: No pleural fluid.  Right greater than left and lower lobe predominant peribronchovascular interstitial thickening and nodularity. Underlying pattern of "tree-in-bud" nodularity. Dominant 1.8 cm left lower lobe pulmonary nodule on image 117/7. Right upper lobe peribronchovascular nodule of 1.0 cm on image 39/7. Musculoskeletal: No acute osseous abnormality. CT ABDOMEN PELVIS FINDINGS Hepatobiliary: Hypoattenuation in segment 4 is likely related to focal hepatic steatosis. Normal gallbladder, without biliary ductal dilatation. Pancreas: Normal, without mass or ductal dilatation. Spleen: Moderate splenomegaly at 14.3 cm craniocaudal. Transverse maximal dimensions 15.0 x 7.6 cm. (Volume = 850) . Adrenals/Urinary Tract: Normal adrenal glands. Normal kidneys, without hydronephrosis. Normal urinary bladder. Stomach/Bowel: Normal stomach, without wall thickening. Normal colon, appendix, and terminal ileum. Normal small bowel. Vascular/Lymphatic: Aortic atherosclerosis. Marked abdominal adenopathy. Left periaortic index node of 2.3 cm on image 78/3. Index ileocolic mesenteric node of 2.1 by 4.3 cm on image 106/3. Index right external iliac node measures 2.1 x 4.5 cm on image 123/3. Reproductive: Mild prostatomegaly. Other: Trace free pelvic fluid is nonspecific. Musculoskeletal: No acute osseous abnormality. IMPRESSION: 1. Marked adenopathy within the chest, abdomen, and pelvis, consistent with active lymphoma/leukemia. 2. Splenomegaly is nonspecific. Splenic involvement cannot be excluded. 3. Right greater than left and basilar predominant peribronchovascular interstitial thickening and nodularity. Considerations include pulmonary involvement of lymphoma and/or infection/aspiration. 4. Coronary artery atherosclerosis. Aortic Atherosclerosis (ICD10-I70.0). 5. Trace nonspecific pelvic fluid. 6. Electronically Signed   By: Abigail Miyamoto M.D.   On: 03/19/2018 14:17    ASSESSMENT & PLAN:  68 y.o. male with  1. Chronic Lymphocytic  Leukemia Initial diagnosis on 02/13/15 Peripheral blood flow cytometry which revealed a Monoclonal B-cell population 09/25/15 Cytogenetics ruled out Mantle cell lymphoma.  PLAN: -Discussed patient's most recent labs from 03/17/18, lymphocytosis with lymphs at 11.1k, WBC at 13.1k, ANC at 900, mild anemia with HGB at 10.1, PLT lower at 103k. LDH at 241.  -Discussed the 03/17/18 FISH CLL Prognostic panel revealed an 11q deletion and Trisomy 12 -Discussed the 12/30/17 BM Biopsy which revealed CLL without evidence of transformation -12/25/17 FISH study ruled out translocation 11;14 -Discussed the 03/19/18 CT N/C/A/P which revealed NECK: Multiple enlarged bilateral cervical lymph nodes consistent with the given diagnosis of CLL. The largest node is a right level 3 lymph node measuring 3.4 x 2.8 x 1.2 cm. 2. Degenerative changes of the cervical spine. 3. Hypodense lesion in the right side of the tongue measuring up to 1.5 cm. This could represent focal infection. Neoplasm is considered less likely. The lesion appears to be superficial along the right lateral aspect of the anterior tongue a. This should be amenable to direct inspection, C/A/P: Marked adenopathy within the chest, abdomen, and pelvis, consistent with active lymphoma/leukemia. 2. Splenomegaly is nonspecific. Splenic involvement cannot be excluded. 3. Right greater than left and basilar predominant peribronchovascular interstitial thickening and nodularity. Considerations include pulmonary involvement of lymphoma and/or Infection/aspiration. 4. Coronary artery atherosclerosis. Aortic Atherosclerosis. 5. Trace nonspecific pelvic fluid. -Discussed the indications to consider initiating treatment including cytopenias, threat of organ injury, and constitutional symptoms -Discussed that the patient's anemia, neutropenia, thrombocytopenia, recently progressed lymphadenopathy, and new fatigue, fevers, and drenching night sweats all serve as indications to begin  treatment -Discussed the treatment recommendations including Bendamustine and Rituxan vs Venetoclax. Not recommending Ibrutinib due to patient's cardiac concerns. -Discussed that CLL has a tendency to require treatment more than once. -Proceed with the 03/26/18 [place] Core Biopsy to rule out Richter's transformation -Will begin steroids after tomorrow's biopsy -Could be right hip  bursitis, do not suspect hip pain is due to lymphadenopathy  -Provided supplemental information -Will see the pt back in 1 week   RTC with Dr Irene Limbo in 1 week with labs   All of the patients questions were answered with apparent satisfaction. The patient knows to call the clinic with any problems, questions or concerns.  The total time spent in the appt was 55 minutes and more than 50% was on counseling and direct patient cares.    Sullivan Lone MD MS AAHIVMS [facility] Hematology/Oncology Physician [facility]  (Office):       [phone] (Work cell):  [phone] (Fax):           [phone]  03/25/2018 10:01 AM  I, Baldwin [place], am acting as a scribe for Dr. Sullivan Lone.   .I have reviewed the above documentation for accuracy and completeness, and I agree with the above. Brunetta Genera MD

## 2018-03-25 ENCOUNTER — Inpatient Hospital Stay (HOSPITAL_BASED_OUTPATIENT_CLINIC_OR_DEPARTMENT_OTHER): Payer: BLUE CROSS/BLUE SHIELD | Admitting: Hematology

## 2018-03-25 VITALS — BP 102/66 | HR 74 | Temp 97.8°F | Resp 17 | Ht 74.0 in | Wt 168.1 lb

## 2018-03-25 DIAGNOSIS — E041 Nontoxic single thyroid nodule: Secondary | ICD-10-CM | POA: Diagnosis not present

## 2018-03-25 DIAGNOSIS — Z79899 Other long term (current) drug therapy: Secondary | ICD-10-CM

## 2018-03-25 DIAGNOSIS — N400 Enlarged prostate without lower urinary tract symptoms: Secondary | ICD-10-CM

## 2018-03-25 DIAGNOSIS — Z7982 Long term (current) use of aspirin: Secondary | ICD-10-CM

## 2018-03-25 DIAGNOSIS — Z85828 Personal history of other malignant neoplasm of skin: Secondary | ICD-10-CM | POA: Diagnosis not present

## 2018-03-25 DIAGNOSIS — C9110 Chronic lymphocytic leukemia of B-cell type not having achieved remission: Secondary | ICD-10-CM

## 2018-03-25 DIAGNOSIS — D649 Anemia, unspecified: Secondary | ICD-10-CM

## 2018-03-25 LAB — FISH,CLL PROGNOSTIC PANEL

## 2018-03-25 MED ORDER — PREDNISONE 50 MG PO TABS: 50.0000 mg | ORAL_TABLET | Freq: Every day | ORAL | 0 refills | Status: DC

## 2018-03-25 NOTE — Patient Instructions (Signed)
Thank you for choosing [facility] to provide your oncology and hematology care.  To afford each patient quality time with our providers, please arrive 30 minutes before your scheduled appointment time.  If you arrive late for your appointment, you may be asked to reschedule.  We strive to give you quality time with our providers, and arriving late affects you and other patients whose appointments are after yours.    If you are a no show for multiple scheduled visits, you may be dismissed from the clinic at the providers discretion.     Again, thank you for choosing [facility], our hope is that these requests will decrease the amount of time that you wait before being seen by our physicians.  ______________________________________________________________________   Should you have questions after your visit to the [facility], please contact our office at [phone] between the hours of 8:30 and 4:30 p.m.    Voicemails left after 4:30p.m will not be returned until the following business day.     For prescription refill requests, please have your pharmacy contact us directly.  Please also try to allow 48 hours for prescription requests.     Please contact the scheduling department for questions regarding scheduling.  For scheduling of procedures such as PET scans, CT scans, MRI, Ultrasound, etc please contact central scheduling at [phone].     Resources For Cancer Patients and Caregivers:    Oncolink.org:  A wonderful resource for patients and healthcare providers for information regarding your disease, ways to tract your treatment, what to expect, etc.      American Cancer Society:  800-227-2345  Can help patients locate various types of support and financial assistance   Cancer Care: 1-800-813-HOPE (4673) Provides financial assistance, online support groups, medication/co-pay assistance.     [place] DSS:  336-641-3447 Where to apply  for food stamps, Medicaid, and utility assistance   Medicare Rights Center: 800-333-4114 Helps people with Medicare understand their rights and benefits, navigate the Medicare system, and secure the quality healthcare they deserve   SCAT: 336-333-6589 [place] [employer]'s shared-ride transportation service for eligible riders who have a disability that prevents them from riding the fixed route bus.     For additional information on assistance programs please contact our social worker:   Abigail Elmore:  [phone]  

## 2018-03-26 ENCOUNTER — Other Ambulatory Visit: Payer: Self-pay

## 2018-03-26 ENCOUNTER — Ambulatory Visit (HOSPITAL_COMMUNITY)
Admission: RE | Admit: 2018-03-26 | Discharge: 2018-03-26 | Disposition: A | Discharge status: Home / Self Care | Payer: BLUE CROSS/BLUE SHIELD | Source: Ambulatory Visit | Attending: Medical | Admitting: Medical

## 2018-03-26 DIAGNOSIS — C9110 Chronic lymphocytic leukemia of B-cell type not having achieved remission: Secondary | ICD-10-CM | POA: Diagnosis not present

## 2018-03-26 DIAGNOSIS — C8300 Small cell B-cell lymphoma, unspecified site: Secondary | ICD-10-CM | POA: Diagnosis not present

## 2018-03-26 DIAGNOSIS — C8511 Unspecified B-cell lymphoma, lymph nodes of head, face, and neck: Secondary | ICD-10-CM | POA: Diagnosis not present

## 2018-03-26 DIAGNOSIS — R591 Generalized enlarged lymph nodes: Secondary | ICD-10-CM

## 2018-03-26 DIAGNOSIS — R590 Localized enlarged lymph nodes: Secondary | ICD-10-CM | POA: Diagnosis not present

## 2018-03-26 LAB — CBC
HEMATOCRIT: 32.8 % — AB (ref 39.0–52.0)
Hemoglobin: 10.2 g/dL — ABNORMAL LOW (ref 13.0–17.0)
MCH: 37.6 pg — ABNORMAL HIGH (ref 26.0–34.0)
MCHC: 31.1 g/dL (ref 30.0–36.0)
MCV: 121 fL — ABNORMAL HIGH (ref 80.0–100.0)
Platelets: 107 10*3/uL — ABNORMAL LOW (ref 150–400)
RBC: 2.71 MIL/uL — ABNORMAL LOW (ref 4.22–5.81)
RDW: 16 % — AB (ref 11.5–15.5)
WBC: 15.1 10*3/uL — ABNORMAL HIGH (ref 4.0–10.5)
nRBC: 0 % (ref 0.0–0.2)

## 2018-03-26 LAB — PROTIME-INR
INR: 0.99
Prothrombin Time: 13 seconds (ref 11.4–15.2)

## 2018-03-26 LAB — APTT: aPTT: 20 seconds — ABNORMAL LOW (ref 24–36)

## 2018-03-26 MED ORDER — FENTANYL CITRATE (PF) 100 MCG/2ML IJ SOLN
INTRAMUSCULAR | Status: AC | PRN
  Administered 2018-03-26 (×2): 50 ug via INTRAVENOUS

## 2018-03-26 MED ORDER — LIDOCAINE HCL 1 % IJ SOLN
INTRAMUSCULAR | Status: AC
  Filled 2018-03-26: qty 20

## 2018-03-26 MED ORDER — MIDAZOLAM HCL 2 MG/2ML IJ SOLN
INTRAMUSCULAR | Status: AC
  Filled 2018-03-26: qty 2

## 2018-03-26 MED ORDER — SODIUM CHLORIDE 0.9 % IV SOLN
INTRAVENOUS | Status: DC
  Administered 2018-03-26: 11:00:00 via INTRAVENOUS

## 2018-03-26 MED ORDER — LIDOCAINE HCL (PF) 1 % IJ SOLN
INTRAMUSCULAR | Status: AC | PRN
  Administered 2018-03-26: 10 mL

## 2018-03-26 MED ORDER — FENTANYL CITRATE (PF) 100 MCG/2ML IJ SOLN
INTRAMUSCULAR | Status: AC
  Filled 2018-03-26: qty 2

## 2018-03-26 MED ORDER — MIDAZOLAM HCL 2 MG/2ML IJ SOLN
INTRAMUSCULAR | Status: AC | PRN
  Administered 2018-03-26 (×2): 1 mg via INTRAVENOUS

## 2018-03-26 NOTE — H&P (Signed)
Chief Complaint: Patient was seen in consultation today for cervical lymphadenopathy.  Referring Physician(s): Tanner,Van E.  Supervising Physician: Markus Daft  Patient Status: [facility] - Out-pt  History of Present Illness: Jake Kennedy is a 68 y.o. male with a past medical history of CAD, CLL, erectile dysfunction, and BPH. He was unfortunately diagnosed with CLL in 12/2017. His cancer is managed by Dr. Lindi Adie, Dr. Irene Limbo, and Sandi Mealy, PA-C. During his recent imaging follow-up, he was found to have widespread diffuse lymphadenopathy.  CT chest/abdomen/pelvis 03/19/2018: 1. Marked adenopathy within the chest, abdomen, and pelvis, consistent with active lymphoma/leukemia. 2. Splenomegaly is nonspecific. Splenic involvement cannot be excluded. 3. Right greater than left and basilar predominant peribronchovascular interstitial thickening and nodularity. Considerations include pulmonary involvement of lymphoma and/or infection/aspiration. 4. Coronary artery atherosclerosis. Aortic Atherosclerosis (ICD10-I70.0). 5. Trace nonspecific pelvic fluid.  IR requested by Sandi Mealy, PA-C for possible image-guided cervical lymph node biopsy. Patient awake and alert sitting in bed. Accompanied by wife. Complains of intermittent abdominal pain, stable over the past few weeks. Denies fever, chills, chest pain, dyspnea, dizziness, or headache.   Past Medical History:  Diagnosis Date  . Basal cell carcinoma    moles removed in 2007 and 2012 from L nose and L hip  . BPH (benign prostatic hypertrophy)   . CAD (coronary artery disease), native coronary artery    a. 02/10/2015 95% mid LAD stenosis s/p DES  b. 07/15/16: LHC showed stable non obst dz and patent stent  . CLL (chronic lymphocytic leukemia) ([place])   . ED (erectile dysfunction)   . Lymphocytosis 02/11/2015   seen by Dr. Lindi Adie 02/11/2015, working up for possible CLL    Past Surgical History:  Procedure Laterality Date  . CARDIAC  CATHETERIZATION  02/10/2015  . CARDIAC CATHETERIZATION N/A 02/10/2015   Procedure: Left Heart Cath and Coronary Angiography;  Surgeon: Sherren Mocha, MD;  Location: [facility];  Service: Cardiovascular;  Laterality: N/A;  . CARDIAC CATHETERIZATION N/A 02/10/2015   Procedure: Coronary Stent Intervention;  Surgeon: Sherren Mocha, MD;  Location: [facility];  Service: Cardiovascular;  Laterality: N/A;  . CORONARY STENT PLACEMENT  02/10/2015   DES to LAD  . LAMINECTOMY    . LEFT HEART CATH AND CORONARY ANGIOGRAPHY N/A 07/15/2016   Procedure: Left Heart Cath and Coronary Angiography;  Surgeon: Martinique, Peter M, MD;  Location: [facility];  Service: Cardiovascular;  Laterality: N/A;  . TONSILLECTOMY      Allergies: Adhesive [tape] and Latex  Medications: Prior to Admission medications   Medication Sig Start Date End Date Taking? Authorizing Provider  aspirin 81 MG tablet Take 81 mg by mouth daily.    [provider]  atorvastatin (LIPITOR) 80 MG tablet TAKE 1 TABLET BY MOUTH ONCE DAILY AT 6 PM 11/11/17   Richardson Dopp T, PA-C  HYDROcodone-acetaminophen (NORCO/VICODIN) 5-325 MG tablet Take 1 tablet by mouth every 6 (six) hours as needed for moderate pain. Take 1/2 to 1 tablet Q 6 hours prn pain 03/20/18   Harle Stanford., PA-C  Multiple Vitamin (MULTIVITAMIN) tablet Take 1 tablet by mouth daily.    [provider]  Multiple Vitamins-Minerals (PRESERVISION AREDS PO) Take 2 capsules by mouth daily.    [provider]  nitroGLYCERIN (NITROSTAT) 0.4 MG SL tablet Place 1 tablet (0.4 mg total) under the tongue every 5 (five) minutes as needed for chest pain. 11/11/17   Richardson Dopp T, PA-C  Omega-3 Fatty Acids (FISH OIL CONCENTRATE  PO) Take 1 capsule by mouth daily.     [provider]  predniSONE (DELTASONE) 50 MG tablet Take 1 tablet (50 mg total) by mouth daily with breakfast. 03/25/18   Brunetta Genera, MD  vardenafil (LEVITRA) 10 MG tablet  Take 10 mg by mouth as directed.    [provider]     Family History  Problem Relation Age of Onset  . Diabetes Mother   . Heart failure Mother   . Heart attack Mother        occured in 40s.   . Healthy Sister   . Healthy Brother   . Healthy Maternal Grandmother   . Healthy Maternal Grandfather   . Healthy Paternal Grandmother   . Healthy Paternal Grandfather   . Healthy Brother     Social History   Socioeconomic History  . Marital status: Married    Spouse name: Not on file  . Number of children: Not on file  . Years of education: Not on file  . Highest education level: Not on file  Occupational History  . Not on file  Social Needs  . Financial resource strain: Not on file  . Food insecurity:    Worry: Not on file    Inability: Not on file  . Transportation needs:    Medical: Not on file    Non-medical: Not on file  Tobacco Use  . Smoking status: Never Smoker  . Smokeless tobacco: Never Used  Substance and Sexual Activity  . Alcohol use: Yes    Alcohol/week: 0.0 standard drinks    Comment: occasional  . Drug use: No  . Sexual activity: Not on file  Lifestyle  . Physical activity:    Days per week: Not on file    Minutes per session: Not on file  . Stress: Not on file  Relationships  . Social connections:    Talks on phone: Not on file    Gets together: Not on file    Attends religious service: Not on file    Active member of club or organization: Not on file    Attends meetings of clubs or organizations: Not on file    Relationship status: Not on file  Other Topics Concern  . Not on file  Social History Narrative  . Not on file     Review of Systems: A 12 point ROS discussed and pertinent positives are indicated in the HPI above.  All other systems are negative.  Review of Systems  Constitutional: Negative for chills and fever.  Respiratory: Negative for shortness of breath and wheezing.   Cardiovascular: Negative for chest pain and  palpitations.  Gastrointestinal: Positive for abdominal pain.  Neurological: Negative for dizziness and headaches.  Psychiatric/Behavioral: Negative for behavioral problems and confusion.    Vital Signs: BP 132/82   Pulse 66   Temp 98.1 F (36.7 C) (Oral)   Resp 16   SpO2 91%   Physical Exam Vitals signs and nursing note reviewed.  Constitutional:      General: He is not in acute distress.    Appearance: Normal appearance.  Cardiovascular:     Rate and Rhythm: Normal rate and regular rhythm.     Heart sounds: Normal heart sounds. No murmur.  Pulmonary:     Effort: Pulmonary effort is normal. No respiratory distress.     Breath sounds: Normal breath sounds. No wheezing.  Skin:    General: Skin is warm and dry.  Neurological:  Mental Status: He is alert and oriented to person, place, and time.  Psychiatric:        Mood and Affect: Mood normal.        Behavior: Behavior normal.        Thought Content: Thought content normal.        Judgment: Judgment normal.      MD Evaluation Airway: WNL Heart: WNL Abdomen: WNL Chest/ Lungs: WNL ASA  Classification: 3 Mallampati/Airway Score: One   Imaging: Dg Chest 2 View  Result Date: 03/06/2018 CLINICAL DATA:  Cough and congestion for several weeks. EXAM: CHEST - 2 VIEW COMPARISON:  July 13, 2016 FINDINGS: The heart size and mediastinal contours are within normal limits. Both lungs are clear. The visualized skeletal structures are unremarkable. IMPRESSION: No active cardiopulmonary disease. Electronically Signed   By: Abelardo Diesel M.D.   On: 03/06/2018 10:42   Ct Soft Tissue Neck W Contrast  Result Date: 03/19/2018 CLINICAL DATA:  Chronic lymphocytic leukemia. Localized enlarged lymph nodes. EXAM: CT NECK WITH CONTRAST TECHNIQUE: Multidetector CT imaging of the neck was performed using the standard protocol following the bolus administration of intravenous contrast. CONTRAST:  159mL OMNIPAQUE IOHEXOL 300 MG/ML  SOLN  COMPARISON:  None. FINDINGS: Pharynx and larynx: No focal mucosal or submucosal lesions are present. Nasopharynx is clear. Soft palate is within normal limits. Tongue base and palatine tonsils are normal. Oropharynx and hypopharynx are clear. Epiglottis is within normal limits. Vocal cords are midline and symmetric. Salivary glands: The submandibular and parotid glands are normal bilaterally. Thyroid: A 12 mm nodule is present at the lower pole of the left lobe of the thyroid. No other significant nodules are present. Lymph nodes: Multiple bilateral enlarged cervical nodes are present. Submandibular nodes measure up to 2.1 cm on the left and 1.4 cm on the right. A 2.2 cm left level 2 lymph node is present. A 2.1 cm right level 2 lymph node is present. The largest node is a right level 3 node measuring 3.4 x 2.8 x 1.2 cm. Left level 2 node measures 2.1 cm. Numerous smaller level 4, posterior triangle, and supraclavicular nodes are present bilaterally. Vascular: Negative. Limited intracranial: Normal Mastoids and visualized paranasal sinuses: Circumferential mucosal thickening is present in the right maxillary sinus. The visualized left maxillary sinus is clear. Mastoid air cells are clear bilaterally. Skeleton: Endplate degenerative changes are most notable at C3-4, C5-6, and C6-7. Upper chest: Lung apices are degraded by patient motion. No focal lesions are evident. Other: A hypodense lesion in the right side of the tongue measures 1.5 x 1.1 cm. IMPRESSION: 1. Multiple enlarged bilateral cervical lymph nodes consistent with the given diagnosis of CLL. The largest node is a right level 3 lymph node measuring 3.4 x 2.8 x 1.2 cm. 2. Degenerative changes of the cervical spine. 3. Hypodense lesion in the right side of the tongue measuring up to 1.5 cm. This could represent focal infection. Neoplasm is considered less likely. The lesion appears to be superficial along the right lateral aspect of the anterior tongue a.  This should be amenable to direct inspection. Electronically Signed   By: San Morelle M.D.   On: 03/19/2018 14:29   Ct Chest W Contrast  Result Date: 03/19/2018 CLINICAL DATA:  Chronic lymphocytic leukemia. EXAM: CT CHEST, ABDOMEN, AND PELVIS WITH CONTRAST TECHNIQUE: Multidetector CT imaging of the chest, abdomen and pelvis was performed following the standard protocol during bolus administration of intravenous contrast. CONTRAST:  185mL OMNIPAQUE IOHEXOL 300 MG/ML  SOLN COMPARISON:  Chest radiograph of 03/06/2018.  No prior CTs. FINDINGS: CT CHEST FINDINGS Cardiovascular: Aortic atherosclerosis. Normal heart size, without pericardial effusion. Lad coronary stent. Right coronary artery calcification. No central pulmonary embolism, on this non-dedicated study. Mediastinum/Nodes: Bilateral axillary adenopathy, as evidenced by enlarged and increased number of nodes. Example left axillary node of 2.3 cm on image 21/3. Right paratracheal index node of 1.6 cm on image 24/3. Bilateral hilar adenopathy, including a left hilar node of 1.4 cm. Lungs/Pleura: No pleural fluid. Right greater than left and lower lobe predominant peribronchovascular interstitial thickening and nodularity. Underlying pattern of "tree-in-bud" nodularity. Dominant 1.8 cm left lower lobe pulmonary nodule on image 117/7. Right upper lobe peribronchovascular nodule of 1.0 cm on image 39/7. Musculoskeletal: No acute osseous abnormality. CT ABDOMEN PELVIS FINDINGS Hepatobiliary: Hypoattenuation in segment 4 is likely related to focal hepatic steatosis. Normal gallbladder, without biliary ductal dilatation. Pancreas: Normal, without mass or ductal dilatation. Spleen: Moderate splenomegaly at 14.3 cm craniocaudal. Transverse maximal dimensions 15.0 x 7.6 cm. (Volume = 850) . Adrenals/Urinary Tract: Normal adrenal glands. Normal kidneys, without hydronephrosis. Normal urinary bladder. Stomach/Bowel: Normal stomach, without wall thickening.  Normal colon, appendix, and terminal ileum. Normal small bowel. Vascular/Lymphatic: Aortic atherosclerosis. Marked abdominal adenopathy. Left periaortic index node of 2.3 cm on image 78/3. Index ileocolic mesenteric node of 2.1 by 4.3 cm on image 106/3. Index right external iliac node measures 2.1 x 4.5 cm on image 123/3. Reproductive: Mild prostatomegaly. Other: Trace free pelvic fluid is nonspecific. Musculoskeletal: No acute osseous abnormality. IMPRESSION: 1. Marked adenopathy within the chest, abdomen, and pelvis, consistent with active lymphoma/leukemia. 2. Splenomegaly is nonspecific. Splenic involvement cannot be excluded. 3. Right greater than left and basilar predominant peribronchovascular interstitial thickening and nodularity. Considerations include pulmonary involvement of lymphoma and/or infection/aspiration. 4. Coronary artery atherosclerosis. Aortic Atherosclerosis (ICD10-I70.0). 5. Trace nonspecific pelvic fluid. 6. Electronically Signed   By: Abigail Miyamoto M.D.   On: 03/19/2018 14:17   Ct Abdomen Pelvis W Contrast  Result Date: 03/19/2018 CLINICAL DATA:  Chronic lymphocytic leukemia. EXAM: CT CHEST, ABDOMEN, AND PELVIS WITH CONTRAST TECHNIQUE: Multidetector CT imaging of the chest, abdomen and pelvis was performed following the standard protocol during bolus administration of intravenous contrast. CONTRAST:  19mL OMNIPAQUE IOHEXOL 300 MG/ML  SOLN COMPARISON:  Chest radiograph of 03/06/2018.  No prior CTs. FINDINGS: CT CHEST FINDINGS Cardiovascular: Aortic atherosclerosis. Normal heart size, without pericardial effusion. Lad coronary stent. Right coronary artery calcification. No central pulmonary embolism, on this non-dedicated study. Mediastinum/Nodes: Bilateral axillary adenopathy, as evidenced by enlarged and increased number of nodes. Example left axillary node of 2.3 cm on image 21/3. Right paratracheal index node of 1.6 cm on image 24/3. Bilateral hilar adenopathy, including a left hilar  node of 1.4 cm. Lungs/Pleura: No pleural fluid. Right greater than left and lower lobe predominant peribronchovascular interstitial thickening and nodularity. Underlying pattern of "tree-in-bud" nodularity. Dominant 1.8 cm left lower lobe pulmonary nodule on image 117/7. Right upper lobe peribronchovascular nodule of 1.0 cm on image 39/7. Musculoskeletal: No acute osseous abnormality. CT ABDOMEN PELVIS FINDINGS Hepatobiliary: Hypoattenuation in segment 4 is likely related to focal hepatic steatosis. Normal gallbladder, without biliary ductal dilatation. Pancreas: Normal, without mass or ductal dilatation. Spleen: Moderate splenomegaly at 14.3 cm craniocaudal. Transverse maximal dimensions 15.0 x 7.6 cm. (Volume = 850) . Adrenals/Urinary Tract: Normal adrenal glands. Normal kidneys, without hydronephrosis. Normal urinary bladder. Stomach/Bowel: Normal stomach, without wall thickening. Normal colon, appendix, and terminal ileum. Normal small bowel. Vascular/Lymphatic: Aortic atherosclerosis. Marked  abdominal adenopathy. Left periaortic index node of 2.3 cm on image 78/3. Index ileocolic mesenteric node of 2.1 by 4.3 cm on image 106/3. Index right external iliac node measures 2.1 x 4.5 cm on image 123/3. Reproductive: Mild prostatomegaly. Other: Trace free pelvic fluid is nonspecific. Musculoskeletal: No acute osseous abnormality. IMPRESSION: 1. Marked adenopathy within the chest, abdomen, and pelvis, consistent with active lymphoma/leukemia. 2. Splenomegaly is nonspecific. Splenic involvement cannot be excluded. 3. Right greater than left and basilar predominant peribronchovascular interstitial thickening and nodularity. Considerations include pulmonary involvement of lymphoma and/or infection/aspiration. 4. Coronary artery atherosclerosis. Aortic Atherosclerosis (ICD10-I70.0). 5. Trace nonspecific pelvic fluid. 6. Electronically Signed   By: Abigail Miyamoto M.D.   On: 03/19/2018 14:17    Labs:  CBC: Recent Labs     12/25/17 1555 12/30/17 0842 03/17/18 1237 03/26/18 1124  WBC 14.3* 14.6* 13.1* 15.1*  HGB 10.7* 11.1* 10.1* 10.2*  HCT 32.5* 34.0* 31.0* 32.8*  PLT 134* 150 103* 107*    COAGS: Recent Labs    03/26/18 1124  INR 0.99  APTT 20*    BMP: Recent Labs    12/25/17 1555 03/17/18 1237  NA 139 139  K 4.5 5.1  CL 103 104  CO2 27 28  GLUCOSE 98 90  BUN 29* 31*  CALCIUM 9.0 8.7*  CREATININE 0.89 0.87  GFRNONAA >60 >60  GFRAA >60 >60    LIVER FUNCTION TESTS: Recent Labs    11/11/17 1010 12/08/17 1505 12/25/17 1555 03/17/18 1237  BILITOT 0.7 0.5 0.7 0.7  AST 29 21 23 23   ALT 56* 25 21 31   ALKPHOS 70 62 76 84  PROT 5.7* 5.9* 6.3* 6.2*  ALBUMIN 4.0 4.0 3.7 3.4*    Assessment and Plan:  Cervical lymphadenopathy. Plan for image-guided cervical lymph node biopsy today with Dr. Anselm Pancoast. Patient is NPO. Afebrile. He does not take blood thinners. INR 0.99 seconds today.  Risks and benefits discussed with the patient including, but not limited to bleeding, infection, damage to adjacent structures or low yield requiring additional tests. All of the patient's questions were answered, patient is agreeable to proceed. Consent signed and in chart.   Thank you for this interesting consult.  I greatly enjoyed meeting Jake Kennedy and look forward to participating in their care.  A copy of this report was sent to the requesting provider on this date.  Electronically Signed: Earley Abide, PA-C 03/26/2018, 12:10 PM   I spent a total of 40 Minutes in face to face in clinical consultation, greater than 50% of which was counseling/coordinating care for cervical lymphadenopathy.

## 2018-03-26 NOTE — Discharge Instructions (Signed)
Needle Biopsy, Care After °These instructions tell you how to care for yourself after your procedure. Your doctor may also give you more specific instructions. Call your doctor if you have any problems or questions. °What can I expect after the procedure? °After the procedure, it is common to have: °· Soreness. °· Bruising. °· Mild pain. °Follow these instructions at home: ° °· Return to your normal activities as told by your doctor. Ask your doctor what activities are safe for you. °· Take over-the-counter and prescription medicines only as told by your doctor. °· Wash your hands with soap and water before you change your bandage (dressing). If you cannot use soap and water, use hand sanitizer. °· Follow instructions from your doctor about: °? How to take care of your puncture site. °? When and how to change your bandage. °? When to remove your bandage. °· Check your puncture site every day for signs of infection. Watch for: °? Redness, swelling, or pain. °? Fluid or blood.  °? Pus or a bad smell. °? Warmth. °· Do not take baths, swim, or use a hot tub until your doctor approves. Ask your doctor if you may take showers. You may only be allowed to take sponge baths. °· Keep all follow-up visits as told by your doctor. This is important. °Contact a doctor if you have: °· A fever. °· Redness, swelling, or pain at the puncture site, and it lasts longer than a few days. °· Fluid, blood, or pus coming from the puncture site. °· Warmth coming from the puncture site. °Get help right away if: °· You have a lot of bleeding from the puncture site. °Summary °· After the procedure, it is common to have soreness, bruising, or mild pain at the puncture site. °· Check your puncture site every day for signs of infection, such as redness, swelling, or pain. °· Get help right away if you have severe bleeding from your puncture site. °This information is not intended to replace advice given to you by your health care provider. Make  sure you discuss any questions you have with your health care provider. °Document Released: 01/04/2008 Document Revised: 02/03/2017 Document Reviewed: 02/03/2017 °Elsevier Interactive Patient Education © 2019 [employer] °Moderate Conscious Sedation, Adult, Care After °These instructions provide you with information about caring for yourself after your procedure. Your health care provider may also give you more specific instructions. Your treatment has been planned according to current medical practices, but problems sometimes occur. Call your health care provider if you have any problems or questions after your procedure. °What can I expect after the procedure? °After your procedure, it is common: °· To feel sleepy for several hours. °· To feel clumsy and have poor balance for several hours. °· To have poor judgment for several hours. °· To vomit if you eat too soon. °Follow these instructions at home: °For at least 24 hours after the procedure: ° °· Do not: °? Participate in activities where you could fall or become injured. °? Drive. °? Use heavy machinery. °? Drink alcohol. °? Take sleeping pills or medicines that cause drowsiness. °? Make important decisions or sign legal documents. °? Take care of children on your own. °· Rest. °Eating and drinking °· Follow the diet recommended by your health care provider. °· If you vomit: °? Drink water, juice, or soup when you can drink without vomiting. °? Make sure you have little or no nausea before eating solid foods. °General instructions °· Have a responsible adult stay   with you until you are awake and alert. °· Take over-the-counter and prescription medicines only as told by your health care provider. °· If you smoke, do not smoke without supervision. °· Keep all follow-up visits as told by your health care provider. This is important. °Contact a health care provider if: °· You keep feeling nauseous or you keep vomiting. °· You feel light-headed. °· You develop a  rash. °· You have a fever. °Get help right away if: °· You have trouble breathing. °This information is not intended to replace advice given to you by your health care provider. Make sure you discuss any questions you have with your health care provider. °Document Released: 11/11/2012 Document Revised: 06/26/2015 Document Reviewed: 05/13/2015 °Elsevier Interactive Patient Education © 2019 [employer] Inc. ° °

## 2018-03-26 NOTE — Procedures (Signed)
Interventional Radiology Procedure:   Indications: CLL and lymphadenopathy   Procedure: US guided left cervical lymph node biopsy   Findings: Numerous enlarged lymph nodes.  7 cores from left cervical node.  Complications: None     EBL: Minimal  Plan: bedrest 1 hour.   Jake Dorvil R. Anselm Pancoast, MD  Pager: [phone]

## 2018-03-31 ENCOUNTER — Ambulatory Visit (HOSPITAL_COMMUNITY): Payer: BLUE CROSS/BLUE SHIELD

## 2018-04-01 ENCOUNTER — Telehealth: Payer: Self-pay | Admitting: Hematology

## 2018-04-01 ENCOUNTER — Inpatient Hospital Stay: Payer: BLUE CROSS/BLUE SHIELD

## 2018-04-01 ENCOUNTER — Inpatient Hospital Stay: Payer: BLUE CROSS/BLUE SHIELD | Admitting: Hematology

## 2018-04-01 VITALS — BP 138/81 | HR 85 | Temp 98.2°F | Resp 18 | Ht 74.0 in | Wt 164.3 lb

## 2018-04-01 DIAGNOSIS — Z85828 Personal history of other malignant neoplasm of skin: Secondary | ICD-10-CM | POA: Diagnosis not present

## 2018-04-01 DIAGNOSIS — D649 Anemia, unspecified: Secondary | ICD-10-CM

## 2018-04-01 DIAGNOSIS — N400 Enlarged prostate without lower urinary tract symptoms: Secondary | ICD-10-CM

## 2018-04-01 DIAGNOSIS — E041 Nontoxic single thyroid nodule: Secondary | ICD-10-CM

## 2018-04-01 DIAGNOSIS — C9110 Chronic lymphocytic leukemia of B-cell type not having achieved remission: Secondary | ICD-10-CM | POA: Diagnosis not present

## 2018-04-01 DIAGNOSIS — Z79899 Other long term (current) drug therapy: Secondary | ICD-10-CM

## 2018-04-01 DIAGNOSIS — Z7982 Long term (current) use of aspirin: Secondary | ICD-10-CM | POA: Diagnosis not present

## 2018-04-01 LAB — CMP (CANCER CENTER ONLY)
ALT: 48 U/L — ABNORMAL HIGH (ref 0–44)
AST: 19 U/L (ref 15–41)
Albumin: 3.6 g/dL (ref 3.5–5.0)
Alkaline Phosphatase: 80 U/L (ref 38–126)
Anion gap: 9 (ref 5–15)
BUN: 39 mg/dL — ABNORMAL HIGH (ref 8–23)
CHLORIDE: 101 mmol/L (ref 98–111)
CO2: 27 mmol/L (ref 22–32)
Calcium: 8.9 mg/dL (ref 8.9–10.3)
Creatinine: 0.98 mg/dL (ref 0.61–1.24)
GFR, Est AFR Am: >60 mL/min (ref >60)
GFR, Estimated: >60 mL/min (ref >60)
Glucose, Bld: 115 mg/dL — ABNORMAL HIGH (ref 70–99)
Potassium: 4.6 mmol/L (ref 3.5–5.1)
Sodium: 137 mmol/L (ref 135–145)
Total Bilirubin: 0.8 mg/dL (ref 0.3–1.2)
Total Protein: 6.7 g/dL (ref 6.5–8.1)

## 2018-04-01 LAB — CBC WITH DIFFERENTIAL (CANCER CENTER ONLY)
Abs Immature Granulocytes: 0 10*3/uL (ref 0.00–0.07)
Basophils Absolute: 0 10*3/uL (ref 0.0–0.1)
Basophils Relative: 0 %
EOS ABS: 0 10*3/uL (ref 0.0–0.5)
Eosinophils Relative: 0 %
HCT: 32.1 % — ABNORMAL LOW (ref 39.0–52.0)
Hemoglobin: 10.3 g/dL — ABNORMAL LOW (ref 13.0–17.0)
Lymphocytes Relative: 96 %
Lymphs Abs: 58.1 10*3/uL — ABNORMAL HIGH (ref 0.7–4.0)
MCH: 38 pg — ABNORMAL HIGH (ref 26.0–34.0)
MCHC: 32.1 g/dL (ref 30.0–36.0)
MCV: 118.5 fL — ABNORMAL HIGH (ref 80.0–100.0)
Monocytes Absolute: 1.2 10*3/uL — ABNORMAL HIGH (ref 0.1–1.0)
Monocytes Relative: 2 %
NEUTROS PCT: 2 %
Neutro Abs: 1.2 10*3/uL — ABNORMAL LOW (ref 1.7–17.7)
PLATELETS: 161 10*3/uL (ref 150–400)
RBC: 2.71 MIL/uL — ABNORMAL LOW (ref 4.22–5.81)
RDW: 16.4 % — AB (ref 11.5–15.5)
WBC Count: 60.5 10*3/uL (ref 4.0–10.5)
nRBC: 0.1 % (ref 0.0–0.2)

## 2018-04-01 NOTE — Progress Notes (Signed)
HEMATOLOGY/ONCOLOGY CLINIC NOTE  Date of Service: 04/01/2018  Patient Care Team: [name], MD as PCP - General (Internal Medicine) Sherren Mocha, MD as PCP - Cardiology (Cardiology)  CHIEF COMPLAINTS/PURPOSE OF CONSULTATION:  Chronic Lymphocytic Leukemia  Oncologic History:     Chronic lymphocytic leukemia (CLL), B-cell ([place])   02/14/2015 Initial Diagnosis    Monoclonal B-cell population CD5 and CD20 positive, differential diagnosis CLL versus mantle cell lymphoma ( patient had absolute lymphocyte count of 8000 in 2010), remained stable.      HISTORY OF PRESENTING ILLNESS:  Jake Kennedy is a wonderful 68 y.o. male who has been referred to [place] by Dr. [name] for evaluation and management of Chronic Lymphocytic Leukemia. The pt was formerly under the care of my colleague Dr. Nicholas Lose. He is accompanied today by his wife. The pt reports that he is doing well overall.  The pt reports that his course of CLL had been pretty consistent since diagnosis in 2017, until roughly 3 months ago in roughly November 2019. He notes that he has had recent increase in fatigue in the last 3 months, and more acutely in the last 3 weeks. He endorses occasional fevers over the last two weeks during the night, highest at 100.2 or 100.3. He also endorses some night sweats, not every night, some of these being "drenching." He notes that he has been eating well for the last 6 months, however has lost 8 pounds in that interim. He endorses regular exercising until the past month, due to his worsened fatigue, and had previously regularly run upwards of 3 miles and bike for several miles as well.  The pt notes that he can't smell very well, in the last 2-3 weeks, and his hearing has also reduced in the last week. He senses that his enlarged lymph nodes are pressing on his neck, and characterizes this as similar to when one has an acute cold and congestion. He had acute pain in his right hip a week ago  and saw my colleague Sandi Mealy, PA in Symptom Managment. He was given Norco but denies taking this because he didn't want to. He endorses a positional element to his hip pain, which e attributes to the pain being from an enlarged lymph node. He notes that his hip pain had sudden onset on the night of 03/18/18. He notes that his neck lymph nodes have grown in the last 3 weeks to a month. The pt notes that his lymph nodes in his neck, armpits, and groin have been enlarged.  The pt tried an antibiotic course 2 weeks ago with his PCP for a suspected sinus infection, during which his cervical lymph nodes enlarged further. He notes that his neck lymph nodes are somewhat improved today as compared to this. The pt denies frequent infections.   Of note prior to the patient's visit today, pt has had CT N/C/A/P completed on 03/19/18 with results revealing NECK: Multiple enlarged bilateral cervical lymph nodes consistent with the given diagnosis of CLL. The largest node is a right level 3 lymph node measuring 3.4 x 2.8 x 1.2 cm. 2. Degenerative changes of the cervical spine. 3. Hypodense lesion in the right side of the tongue measuring up to 1.5 cm. This could represent focal infection. Neoplasm is considered less likely. The lesion appears to be superficial along the right lateral aspect of the anterior tongue a. This should be amenable to direct inspection, C/A/P: Marked adenopathy within the chest, abdomen, and pelvis, consistent  with active lymphoma/leukemia. 2. Splenomegaly is nonspecific. Splenic involvement cannot be excluded. 3. Right greater than left and basilar predominant peribronchovascular interstitial thickening and nodularity. Considerations include pulmonary involvement of lymphoma and/or Infection/aspiration. 4. Coronary artery atherosclerosis. Aortic Atherosclerosis. 5. Trace nonspecific pelvic fluid.  Most recent lab results (03/17/18) of CBC w/diff and CMP is as follows: all values are WNL except for  WBC at 13.1k, RBC at 2.67, HGB at 10.1, HCT at 31.0, MCV at 116.1, MCH at 37.8, PLT at 103k, ANC at 900, Lymphs abs at 11.1k, BUN at 31, Calcium at 8.7, Total Protein at 6.2, Albumin at 3.4. 03/17/18 LDH at 241  On review of systems, pt reports new fatigue, recent fevers, recent night sweats, recently grown neck lymph nodes, eating well, some weight loss, right groin pain, and denies chills, SOB, ear pain, pain along the spine, frequent infections, flank pain, leg swelling, testicular pain or swelling, and any other symptoms.  Interval History:   Jake Kennedy returns today for management and evaluation of his CLL. The patient's last visit with [place] was on 03/25/18. The pt reports that he is doing well overall.   The pt reports that taking the steroids in the interim have improved his cervical lymph nodes and hearing. The pt notes that he is consuming 2500 calories a day, and has not been keeping as active as was his baseline, and has lost 4 pounds since our last visit. The pt notes that he continues to have a cough and a post-nasal drip, and has tried antibiotics and antihistamines for this recently. He notes that he has not had night sweats since our last visit and since beginning steroids.  Lab results today (04/01/18) of CBC w/diff and CMP is as follows: all values are WNL except for WBC at 60.5k, RBC at 2.71, HGB at 10.3, HCT at 32.1, MCV at 118.5, MCH at 38.0, RDW at 16.4, ANC at 1.2k, Lymphs abs at 58.1k, Monocytes abs at 1.2k, Glucose at 115, BUN at 39, ALT at 48.  On review of systems, pt reports cough, post-nasal drip, lower energy levels, eating well, weight loss, lower activity levels, smaller neck lymph nodes, and denies night sweats in the last week, leg swelling, abdominal pains, and any other symptoms.  MEDICAL HISTORY:  Past Medical History:  Diagnosis Date  . Basal cell carcinoma    moles removed in 2007 and 2012 from L nose and L hip  . BPH (benign prostatic hypertrophy)   . CAD  (coronary artery disease), native coronary artery    a. 02/10/2015 95% mid LAD stenosis s/p DES  b. 07/15/16: LHC showed stable non obst dz and patent stent  . CLL (chronic lymphocytic leukemia) ([place])   . ED (erectile dysfunction)   . Lymphocytosis 02/11/2015   seen by Dr. Lindi Adie 02/11/2015, working up for possible CLL    SURGICAL HISTORY: Past Surgical History:  Procedure Laterality Date  . CARDIAC CATHETERIZATION  02/10/2015  . CARDIAC CATHETERIZATION N/A 02/10/2015   Procedure: Left Heart Cath and Coronary Angiography;  Surgeon: Sherren Mocha, MD;  Location: [facility];  Service: Cardiovascular;  Laterality: N/A;  . CARDIAC CATHETERIZATION N/A 02/10/2015   Procedure: Coronary Stent Intervention;  Surgeon: Sherren Mocha, MD;  Location: [facility];  Service: Cardiovascular;  Laterality: N/A;  . CORONARY STENT PLACEMENT  02/10/2015   DES to LAD  . LAMINECTOMY    . LEFT HEART CATH AND CORONARY ANGIOGRAPHY N/A 07/15/2016   Procedure: Left Heart Cath and  Coronary Angiography;  Surgeon: Martinique, Peter M, MD;  Location: [facility];  Service: Cardiovascular;  Laterality: N/A;  . TONSILLECTOMY      SOCIAL HISTORY: Social History   Socioeconomic History  . Marital status: Married    Spouse name: Not on file  . Number of children: Not on file  . Years of education: Not on file  . Highest education level: Not on file  Occupational History  . Not on file  Social Needs  . Financial resource strain: Not on file  . Food insecurity:    Worry: Not on file    Inability: Not on file  . Transportation needs:    Medical: Not on file    Non-medical: Not on file  Tobacco Use  . Smoking status: Never Smoker  . Smokeless tobacco: Never Used  Substance and Sexual Activity  . Alcohol use: Yes    Alcohol/week: 0.0 standard drinks    Comment: occasional  . Drug use: No  . Sexual activity: Not on file  Lifestyle  . Physical activity:    Days per week: Not on file    Minutes per  session: Not on file  . Stress: Not on file  Relationships  . Social connections:    Talks on phone: Not on file    Gets together: Not on file    Attends religious service: Not on file    Active member of club or organization: Not on file    Attends meetings of clubs or organizations: Not on file    Relationship status: Not on file  . Intimate partner violence:    Fear of current or ex partner: Not on file    Emotionally abused: Not on file    Physically abused: Not on file    Forced sexual activity: Not on file  Other Topics Concern  . Not on file  Social History Narrative  . Not on file    FAMILY HISTORY: Family History  Problem Relation Age of Onset  . Diabetes Mother   . Heart failure Mother   . Heart attack Mother        occured in 1s.   . Healthy Sister   . Healthy Brother   . Healthy Maternal Grandmother   . Healthy Maternal Grandfather   . Healthy Paternal Grandmother   . Healthy Paternal Grandfather   . Healthy Brother     ALLERGIES:  is allergic to adhesive [tape] and latex.  MEDICATIONS:  Current Outpatient Medications  Medication Sig Dispense Refill  . aspirin 81 MG tablet Take 81 mg by mouth daily.    Marland Kitchen atorvastatin (LIPITOR) 80 MG tablet TAKE 1 TABLET BY MOUTH ONCE DAILY AT 6 PM 90 tablet 3  . HYDROcodone-acetaminophen (NORCO/VICODIN) 5-325 MG tablet Take 1 tablet by mouth every 6 (six) hours as needed for moderate pain. Take 1/2 to 1 tablet Q 6 hours prn pain 30 tablet 0  . ibuprofen (ADVIL,MOTRIN) 200 MG tablet Take 200 mg by mouth every 6 (six) hours as needed.    . Multiple Vitamin (MULTIVITAMIN) tablet Take 1 tablet by mouth daily.    . Multiple Vitamins-Minerals (PRESERVISION AREDS PO) Take 2 capsules by mouth daily.    . nitroGLYCERIN (NITROSTAT) 0.4 MG SL tablet Place 1 tablet (0.4 mg total) under the tongue every 5 (five) minutes as needed for chest pain. 25 tablet 3  . Omega-3 Fatty Acids (FISH OIL CONCENTRATE PO) Take 1 capsule by mouth  daily.     Marland Kitchen  predniSONE (DELTASONE) 50 MG tablet Take 1 tablet (50 mg total) by mouth daily with breakfast. 10 tablet 0  . vardenafil (LEVITRA) 10 MG tablet Take 10 mg by mouth as directed.     No current facility-administered medications for this visit.     REVIEW OF SYSTEMS:    A 10+ POINT REVIEW OF SYSTEMS WAS OBTAINED including neurology, dermatology, psychiatry, cardiac, respiratory, lymph, extremities, GI, GU, Musculoskeletal, constitutional, breasts, reproductive, HEENT.  All pertinent positives are noted in the HPI.  All others are negative.   PHYSICAL EXAMINATION: ECOG PERFORMANCE STATUS: 1 - Symptomatic but completely ambulatory  . Vitals:   04/01/18 1247  BP: 138/81  Pulse: 85  Resp: 18  Temp: 98.2 F (36.8 C)  SpO2: (!) 85%   Filed Weights   04/01/18 1247  Weight: 164 lb 4.8 oz (74.5 kg)   .Body mass index is 21.09 kg/m.  GENERAL:alert, in no acute distress and comfortable SKIN: no acute rashes, no significant lesions EYES: conjunctiva are pink and non-injected, sclera anicteric OROPHARYNX: MMM, no exudates, no oropharyngeal erythema or ulceration NECK: supple, no JVD LYMPH: Palpable 2cm lymph nodes in bilateral cervical and axilla. Palpable right inguinal LN 3-4cm. Palpable 1-2cm lymph nodes in left inguinal. LUNGS: clear to auscultation b/l with normal respiratory effort HEART: regular rate & rhythm ABDOMEN:  normoactive bowel sounds , non tender, not distended. No palpable hepatomegaly. Palpable splenomegaly 1cm under left costal margin.  Extremity: no pedal edema PSYCH: alert & oriented x 3 with fluent speech NEURO: no focal motor/sensory deficits   LABORATORY DATA:  I have reviewed the data as listed  . CBC Latest Ref Rng & Units 04/01/2018 03/26/2018 03/17/2018  WBC 4.0 - 10.5 K/uL 60.5(HH) 15.1(H) 13.1(H)  Hemoglobin 13.0 - 17.0 g/dL 10.3(L) 10.2(L) 10.1(L)  Hematocrit 39.0 - 52.0 % 32.1(L) 32.8(L) 31.0(L)  Platelets 150 - 400 K/uL 161 107(L) 103(L)     . CMP Latest Ref Rng & Units 04/01/2018 03/17/2018 12/25/2017  Glucose 70 - 99 mg/dL 115(H) 90 98  BUN 8 - 23 mg/dL 39(H) 31(H) 29(H)  Creatinine 0.61 - 1.24 mg/dL 0.98 0.87 0.89  Sodium 135 - 145 mmol/L 137 139 139  Potassium 3.5 - 5.1 mmol/L 4.6 5.1 4.5  Chloride 98 - 111 mmol/L 101 104 103  CO2 22 - 32 mmol/L 27 28 27   Calcium 8.9 - 10.3 mg/dL 8.9 8.7(L) 9.0  Total Protein 6.5 - 8.1 g/dL 6.7 6.2(L) 6.3(L)  Total Bilirubin 0.3 - 1.2 mg/dL 0.8 0.7 0.7  Alkaline Phos 38 - 126 U/L 80 84 76  AST 15 - 41 U/L 19 23 23   ALT 0 - 44 U/L 48(H) 31 21                RADIOGRAPHIC STUDIES: I have personally reviewed the radiological images as listed and agreed with the findings in the report. Dg Chest 2 View  Result Date: 03/06/2018 CLINICAL DATA:  Cough and congestion for several weeks. EXAM: CHEST - 2 VIEW COMPARISON:  July 13, 2016 FINDINGS: The heart size and mediastinal contours are within normal limits. Both lungs are clear. The visualized skeletal structures are unremarkable. IMPRESSION: No active cardiopulmonary disease. Electronically Signed   By: Abelardo Diesel M.D.   On: 03/06/2018 10:42   Ct Soft Tissue Neck W Contrast  Result Date: 03/19/2018 CLINICAL DATA:  Chronic lymphocytic leukemia. Localized enlarged lymph nodes. EXAM: CT NECK WITH CONTRAST TECHNIQUE: Multidetector CT imaging of the neck was performed using the standard protocol following  the bolus administration of intravenous contrast. CONTRAST:  1108mL OMNIPAQUE IOHEXOL 300 MG/ML  SOLN COMPARISON:  None. FINDINGS: Pharynx and larynx: No focal mucosal or submucosal lesions are present. Nasopharynx is clear. Soft palate is within normal limits. Tongue base and palatine tonsils are normal. Oropharynx and hypopharynx are clear. Epiglottis is within normal limits. Vocal cords are midline and symmetric. Salivary glands: The submandibular and parotid glands are normal bilaterally. Thyroid: A 12 mm nodule is present at the  lower pole of the left lobe of the thyroid. No other significant nodules are present. Lymph nodes: Multiple bilateral enlarged cervical nodes are present. Submandibular nodes measure up to 2.1 cm on the left and 1.4 cm on the right. A 2.2 cm left level 2 lymph node is present. A 2.1 cm right level 2 lymph node is present. The largest node is a right level 3 node measuring 3.4 x 2.8 x 1.2 cm. Left level 2 node measures 2.1 cm. Numerous smaller level 4, posterior triangle, and supraclavicular nodes are present bilaterally. Vascular: Negative. Limited intracranial: Normal Mastoids and visualized paranasal sinuses: Circumferential mucosal thickening is present in the right maxillary sinus. The visualized left maxillary sinus is clear. Mastoid air cells are clear bilaterally. Skeleton: Endplate degenerative changes are most notable at C3-4, C5-6, and C6-7. Upper chest: Lung apices are degraded by patient motion. No focal lesions are evident. Other: A hypodense lesion in the right side of the tongue measures 1.5 x 1.1 cm. IMPRESSION: 1. Multiple enlarged bilateral cervical lymph nodes consistent with the given diagnosis of CLL. The largest node is a right level 3 lymph node measuring 3.4 x 2.8 x 1.2 cm. 2. Degenerative changes of the cervical spine. 3. Hypodense lesion in the right side of the tongue measuring up to 1.5 cm. This could represent focal infection. Neoplasm is considered less likely. The lesion appears to be superficial along the right lateral aspect of the anterior tongue a. This should be amenable to direct inspection. Electronically Signed   By: San Morelle M.D.   On: 03/19/2018 14:29   Ct Chest W Contrast  Result Date: 03/19/2018 CLINICAL DATA:  Chronic lymphocytic leukemia. EXAM: CT CHEST, ABDOMEN, AND PELVIS WITH CONTRAST TECHNIQUE: Multidetector CT imaging of the chest, abdomen and pelvis was performed following the standard protocol during bolus administration of intravenous contrast.  CONTRAST:  129mL OMNIPAQUE IOHEXOL 300 MG/ML  SOLN COMPARISON:  Chest radiograph of 03/06/2018.  No prior CTs. FINDINGS: CT CHEST FINDINGS Cardiovascular: Aortic atherosclerosis. Normal heart size, without pericardial effusion. Lad coronary stent. Right coronary artery calcification. No central pulmonary embolism, on this non-dedicated study. Mediastinum/Nodes: Bilateral axillary adenopathy, as evidenced by enlarged and increased number of nodes. Example left axillary node of 2.3 cm on image 21/3. Right paratracheal index node of 1.6 cm on image 24/3. Bilateral hilar adenopathy, including a left hilar node of 1.4 cm. Lungs/Pleura: No pleural fluid. Right greater than left and lower lobe predominant peribronchovascular interstitial thickening and nodularity. Underlying pattern of "tree-in-bud" nodularity. Dominant 1.8 cm left lower lobe pulmonary nodule on image 117/7. Right upper lobe peribronchovascular nodule of 1.0 cm on image 39/7. Musculoskeletal: No acute osseous abnormality. CT ABDOMEN PELVIS FINDINGS Hepatobiliary: Hypoattenuation in segment 4 is likely related to focal hepatic steatosis. Normal gallbladder, without biliary ductal dilatation. Pancreas: Normal, without mass or ductal dilatation. Spleen: Moderate splenomegaly at 14.3 cm craniocaudal. Transverse maximal dimensions 15.0 x 7.6 cm. (Volume = 850) . Adrenals/Urinary Tract: Normal adrenal glands. Normal kidneys, without hydronephrosis. Normal urinary bladder. Stomach/Bowel:  Normal stomach, without wall thickening. Normal colon, appendix, and terminal ileum. Normal small bowel. Vascular/Lymphatic: Aortic atherosclerosis. Marked abdominal adenopathy. Left periaortic index node of 2.3 cm on image 78/3. Index ileocolic mesenteric node of 2.1 by 4.3 cm on image 106/3. Index right external iliac node measures 2.1 x 4.5 cm on image 123/3. Reproductive: Mild prostatomegaly. Other: Trace free pelvic fluid is nonspecific. Musculoskeletal: No acute osseous  abnormality. IMPRESSION: 1. Marked adenopathy within the chest, abdomen, and pelvis, consistent with active lymphoma/leukemia. 2. Splenomegaly is nonspecific. Splenic involvement cannot be excluded. 3. Right greater than left and basilar predominant peribronchovascular interstitial thickening and nodularity. Considerations include pulmonary involvement of lymphoma and/or infection/aspiration. 4. Coronary artery atherosclerosis. Aortic Atherosclerosis (ICD10-I70.0). 5. Trace nonspecific pelvic fluid. 6. Electronically Signed   By: Abigail Miyamoto M.D.   On: 03/19/2018 14:17   Ct Abdomen Pelvis W Contrast  Result Date: 03/19/2018 CLINICAL DATA:  Chronic lymphocytic leukemia. EXAM: CT CHEST, ABDOMEN, AND PELVIS WITH CONTRAST TECHNIQUE: Multidetector CT imaging of the chest, abdomen and pelvis was performed following the standard protocol during bolus administration of intravenous contrast. CONTRAST:  188mL OMNIPAQUE IOHEXOL 300 MG/ML  SOLN COMPARISON:  Chest radiograph of 03/06/2018.  No prior CTs. FINDINGS: CT CHEST FINDINGS Cardiovascular: Aortic atherosclerosis. Normal heart size, without pericardial effusion. Lad coronary stent. Right coronary artery calcification. No central pulmonary embolism, on this non-dedicated study. Mediastinum/Nodes: Bilateral axillary adenopathy, as evidenced by enlarged and increased number of nodes. Example left axillary node of 2.3 cm on image 21/3. Right paratracheal index node of 1.6 cm on image 24/3. Bilateral hilar adenopathy, including a left hilar node of 1.4 cm. Lungs/Pleura: No pleural fluid. Right greater than left and lower lobe predominant peribronchovascular interstitial thickening and nodularity. Underlying pattern of "tree-in-bud" nodularity. Dominant 1.8 cm left lower lobe pulmonary nodule on image 117/7. Right upper lobe peribronchovascular nodule of 1.0 cm on image 39/7. Musculoskeletal: No acute osseous abnormality. CT ABDOMEN PELVIS FINDINGS Hepatobiliary:  Hypoattenuation in segment 4 is likely related to focal hepatic steatosis. Normal gallbladder, without biliary ductal dilatation. Pancreas: Normal, without mass or ductal dilatation. Spleen: Moderate splenomegaly at 14.3 cm craniocaudal. Transverse maximal dimensions 15.0 x 7.6 cm. (Volume = 850) . Adrenals/Urinary Tract: Normal adrenal glands. Normal kidneys, without hydronephrosis. Normal urinary bladder. Stomach/Bowel: Normal stomach, without wall thickening. Normal colon, appendix, and terminal ileum. Normal small bowel. Vascular/Lymphatic: Aortic atherosclerosis. Marked abdominal adenopathy. Left periaortic index node of 2.3 cm on image 78/3. Index ileocolic mesenteric node of 2.1 by 4.3 cm on image 106/3. Index right external iliac node measures 2.1 x 4.5 cm on image 123/3. Reproductive: Mild prostatomegaly. Other: Trace free pelvic fluid is nonspecific. Musculoskeletal: No acute osseous abnormality. IMPRESSION: 1. Marked adenopathy within the chest, abdomen, and pelvis, consistent with active lymphoma/leukemia. 2. Splenomegaly is nonspecific. Splenic involvement cannot be excluded. 3. Right greater than left and basilar predominant peribronchovascular interstitial thickening and nodularity. Considerations include pulmonary involvement of lymphoma and/or infection/aspiration. 4. Coronary artery atherosclerosis. Aortic Atherosclerosis (ICD10-I70.0). 5. Trace nonspecific pelvic fluid. 6. Electronically Signed   By: Abigail Miyamoto M.D.   On: 03/19/2018 14:17   [place] Core Biopsy (lymph Nodes)  Result Date: 03/26/2018 INDICATION: 68 year old with CLL and new onset of progressive lymphadenopathy. Request for lymph node biopsy. EXAM: ULTRASOUND-GUIDED LEFT CERVICAL LYMPH NODE BIOPSY MEDICATIONS: None. ANESTHESIA/SEDATION: Moderate (conscious) sedation was employed during this procedure. A total of Versed 2.0 mg and Fentanyl 100 mcg was administered intravenously. Moderate Sedation Time: 18 minutes. The patient's level  of consciousness and  vital signs were monitored continuously by radiology nursing throughout the procedure under my direct supervision. FLUOROSCOPY TIME:  None COMPLICATIONS: None immediate. PROCEDURE: Informed written consent was obtained from the patient after a thorough discussion of the procedural risks, benefits and alternatives. All questions were addressed. A timeout was performed prior to the initiation of the procedure. Both sides of the neck were evaluated with ultrasound. Multiple enlarged lymph nodes bilaterally. Enlarged lymph node in the upper left neck was targeted for biopsy because it was not close to any large vascular structures. Left side of the neck was prepped with chlorhexidine. Skin was anesthetized with 1% lidocaine. Using ultrasound guidance, 18 gauge core biopsies were obtained from the enlarged cervical lymph node. Total of 7 core biopsies were obtained. Specimens placed in saline. Bandage placed over the puncture site. FINDINGS: Extensive bilateral cervical lymphadenopathy. Core biopsies obtained from a left upper cervical lymph node. No bleeding or hematoma formation at end of procedure. IMPRESSION: Successful ultrasound-guided left cervical lymph node biopsy. Electronically Signed   By: Markus Daft M.D.   On: 03/26/2018 15:06    ASSESSMENT & PLAN:  68 y.o. male with  1. Chronic Lymphocytic Leukemia Initial diagnosis on 02/13/15 Peripheral blood flow cytometry which revealed a Monoclonal B-cell population 09/25/15 Cytogenetics ruled out Mantle cell lymphoma. 12/25/17 FISH study ruled out translocation 11;14 12/30/17 BM Biopsy revealed CLL without evidence of transformation  03/17/18 FISH CLL Prognostic panel revealed an 11q deletion and Trisomy 12  03/19/18 CT N/C/A/P revealed NECK: Multiple enlarged bilateral cervical lymph nodes consistent with the given diagnosis of CLL. The largest node is a right level 3 lymph node measuring 3.4 x 2.8 x 1.2 cm. 2. Degenerative changes of the  cervical spine. 3. Hypodense lesion in the right side of the tongue measuring up to 1.5 cm. This could represent focal infection. Neoplasm is considered less likely. The lesion appears to be superficial along the right lateral aspect of the anterior tongue a. This should be amenable to direct inspection, C/A/P: Marked adenopathy within the chest, abdomen, and pelvis, consistent with active lymphoma/leukemia. 2. Splenomegaly is nonspecific. Splenic involvement cannot be excluded. 3. Right greater than left and basilar predominant peribronchovascular interstitial thickening and nodularity. Considerations include pulmonary involvement of lymphoma and/or Infection/aspiration. 4. Coronary artery atherosclerosis. Aortic Atherosclerosis. 5. Trace nonspecific pelvic fluid.  PLAN: -Discussed pt labwork today, 04/01/18; WBC at 60.5k, HGB stable at 10.3, chemistries are stable -Discussed the 03/26/18 Left cervical LN biopsy which revealed SLL/CLL -Discussed again that the patient's anemia, neutropenia, thrombocytopenia, recently progressed lymphadenopathy, and new fatigue, fevers, and drenching night sweats all serve as indications to begin treatment -Discussed the options for treatment in relation to the patient's goals of care: 4 weekly cycles of Rituxan vs Rituxan and Bendamustine for 3-6 cycles vs Venetoclax +/- Rituxan. -Not recommending Ibrutinib due to patient's cardiac concerns. -Discussed that CLL has a tendency to require treatment more than once. -The pt prefers to begin treatment soon, and will consider the treatment options we discussed in the short interim -Could be right hip bursitis, do not suspect hip pain is due to lymphadenopathy   All of the patients questions were answered with apparent satisfaction. The patient knows to call the clinic with any problems, questions or concerns.  The total time spent in the appt was 35 minutes and more than 50% was on counseling and direct patient cares.      Sullivan Lone MD [place] AAHIVMS [facility] Hematology/Oncology Physician [place]  (Office):  [phone] (Work cell):  [phone] (Fax):           [phone]  04/01/2018 1:47 PM  I, Baldwin Jamaica, am acting as a scribe for Dr. Sullivan Lone.   .I have reviewed the above documentation for accuracy and completeness, and I agree with the above. Brunetta Genera MD

## 2018-04-01 NOTE — Telephone Encounter (Signed)
No los per 2/26. 

## 2018-04-02 ENCOUNTER — Telehealth: Payer: Self-pay | Admitting: *Deleted

## 2018-04-02 NOTE — Telephone Encounter (Signed)
Jake Kennedy called to say he studied the treatment options following his appt with Dr. Irene Limbo yesterday and has questions. He wants to know if he should schedule an appointment? Advised him that Dr. Irene Limbo will be asked about making an appt. Patient verbalized understanding.

## 2018-04-03 NOTE — Telephone Encounter (Signed)
Dr. Irene Limbo took patient information and contacted patient.

## 2018-04-04 ENCOUNTER — Other Ambulatory Visit: Payer: Self-pay | Admitting: Hematology

## 2018-04-06 ENCOUNTER — Telehealth: Payer: Self-pay | Admitting: *Deleted

## 2018-04-06 NOTE — Telephone Encounter (Signed)
Called to ask if Dr. Irene Limbo wanted to refill his prednisone. Patient states his head feels better since he's taken it, but he wanted to know if it was appropriate.  [facility] said he had to call Dr. Irene Limbo to ask. Message sent to Dr. Irene Limbo.

## 2018-04-08 ENCOUNTER — Inpatient Hospital Stay: Payer: BLUE CROSS/BLUE SHIELD | Attending: Hematology and Oncology

## 2018-04-08 ENCOUNTER — Ambulatory Visit (HOSPITAL_COMMUNITY)
Admission: RE | Admit: 2018-04-08 | Discharge: 2018-04-08 | Disposition: A | Discharge status: Home / Self Care | Payer: BLUE CROSS/BLUE SHIELD | Source: Ambulatory Visit | Attending: Medical | Admitting: Medical

## 2018-04-08 ENCOUNTER — Ambulatory Visit: Payer: BLUE CROSS/BLUE SHIELD | Admitting: Hematology and Oncology

## 2018-04-08 ENCOUNTER — Inpatient Hospital Stay (HOSPITAL_BASED_OUTPATIENT_CLINIC_OR_DEPARTMENT_OTHER): Payer: BLUE CROSS/BLUE SHIELD | Admitting: Medical

## 2018-04-08 ENCOUNTER — Telehealth: Payer: Self-pay | Admitting: Emergency Medicine

## 2018-04-08 ENCOUNTER — Other Ambulatory Visit: Payer: BLUE CROSS/BLUE SHIELD

## 2018-04-08 ENCOUNTER — Other Ambulatory Visit: Payer: Self-pay | Admitting: Emergency Medicine

## 2018-04-08 VITALS — BP 119/88 | HR 74 | Temp 98.8°F | Resp 18 | Ht 74.0 in | Wt 163.6 lb

## 2018-04-08 DIAGNOSIS — T380X5A Adverse effect of glucocorticoids and synthetic analogues, initial encounter: Secondary | ICD-10-CM | POA: Diagnosis not present

## 2018-04-08 DIAGNOSIS — R059 Cough, unspecified: Secondary | ICD-10-CM

## 2018-04-08 DIAGNOSIS — Z79899 Other long term (current) drug therapy: Secondary | ICD-10-CM | POA: Insufficient documentation

## 2018-04-08 DIAGNOSIS — I2510 Atherosclerotic heart disease of native coronary artery without angina pectoris: Secondary | ICD-10-CM | POA: Insufficient documentation

## 2018-04-08 DIAGNOSIS — R05 Cough: Secondary | ICD-10-CM

## 2018-04-08 DIAGNOSIS — C9110 Chronic lymphocytic leukemia of B-cell type not having achieved remission: Secondary | ICD-10-CM | POA: Diagnosis not present

## 2018-04-08 DIAGNOSIS — E041 Nontoxic single thyroid nodule: Secondary | ICD-10-CM

## 2018-04-08 DIAGNOSIS — R590 Localized enlarged lymph nodes: Secondary | ICD-10-CM | POA: Diagnosis not present

## 2018-04-08 DIAGNOSIS — I700 Atherosclerosis of aorta: Secondary | ICD-10-CM | POA: Insufficient documentation

## 2018-04-08 DIAGNOSIS — R634 Abnormal weight loss: Secondary | ICD-10-CM | POA: Insufficient documentation

## 2018-04-08 DIAGNOSIS — R739 Hyperglycemia, unspecified: Secondary | ICD-10-CM | POA: Insufficient documentation

## 2018-04-08 DIAGNOSIS — J069 Acute upper respiratory infection, unspecified: Secondary | ICD-10-CM

## 2018-04-08 DIAGNOSIS — N400 Enlarged prostate without lower urinary tract symptoms: Secondary | ICD-10-CM | POA: Diagnosis not present

## 2018-04-08 DIAGNOSIS — Z85828 Personal history of other malignant neoplasm of skin: Secondary | ICD-10-CM

## 2018-04-08 DIAGNOSIS — N529 Male erectile dysfunction, unspecified: Secondary | ICD-10-CM | POA: Diagnosis not present

## 2018-04-08 DIAGNOSIS — R161 Splenomegaly, not elsewhere classified: Secondary | ICD-10-CM | POA: Insufficient documentation

## 2018-04-08 LAB — COMPREHENSIVE METABOLIC PANEL
ALT: 40 U/L (ref 0–44)
AST: 20 U/L (ref 15–41)
Albumin: 3.2 g/dL — ABNORMAL LOW (ref 3.5–5.0)
Alkaline Phosphatase: 63 U/L (ref 38–126)
Anion gap: 4 — ABNORMAL LOW (ref 5–15)
BUN: 31 mg/dL — ABNORMAL HIGH (ref 8–23)
CHLORIDE: 105 mmol/L (ref 98–111)
CO2: 28 mmol/L (ref 22–32)
CREATININE: 0.96 mg/dL (ref 0.61–1.24)
Calcium: 8.1 mg/dL — ABNORMAL LOW (ref 8.9–10.3)
GFR calc non Af Amer: >60 mL/min (ref >60)
Glucose, Bld: 168 mg/dL — ABNORMAL HIGH (ref 70–99)
Potassium: 4.6 mmol/L (ref 3.5–5.1)
Sodium: 137 mmol/L (ref 135–145)
Total Bilirubin: 0.7 mg/dL (ref 0.3–1.2)
Total Protein: 6.3 g/dL — ABNORMAL LOW (ref 6.5–8.1)

## 2018-04-08 MED ORDER — ALBUTEROL SULFATE (2.5 MG/3ML) 0.083% IN NEBU
2.5000 mg | INHALATION_SOLUTION | Freq: Once | RESPIRATORY_TRACT | Status: AC
  Administered 2018-04-08: 2.5 mg via RESPIRATORY_TRACT
  Filled 2018-04-08: qty 3

## 2018-04-08 MED ORDER — HYDROCODONE-HOMATROPINE 5-1.5 MG/5ML PO SYRP: 5.0000 mL | ORAL_SOLUTION | Freq: Four times a day (QID) | ORAL | 0 refills | Status: DC | PRN

## 2018-04-08 MED ORDER — LEVOFLOXACIN 750 MG PO TABS: 750.0000 mg | ORAL_TABLET | Freq: Every day | ORAL | 0 refills | Status: DC

## 2018-04-08 MED ORDER — ALBUTEROL SULFATE (2.5 MG/3ML) 0.083% IN NEBU
INHALATION_SOLUTION | RESPIRATORY_TRACT | Status: AC
  Filled 2018-04-08: qty 3

## 2018-04-08 MED ORDER — ALBUTEROL SULFATE HFA 108 (90 BASE) MCG/ACT IN AERS: 2.0000 | INHALATION_SPRAY | Freq: Four times a day (QID) | RESPIRATORY_TRACT | 2 refills | Status: DC | PRN

## 2018-04-08 NOTE — Telephone Encounter (Signed)
Pt called reporting that he's finished with steroids prescribed by Dr. Irene Limbo for recent wet cough w/green sputum and increased DOE, however he still isn't feeling well.  Denies F/C or N/V/D or CP.  MD Irene Limbo gave VO for pt to be seen today in [facility] to follow up on continuing symptoms.  Pt VU of appt times at 1300 for lab and 1330 for [facility].

## 2018-04-08 NOTE — Progress Notes (Signed)
These results were called to Jake Kennedy and were reviewed with him . His questions were answered. He expressed understanding.

## 2018-04-08 NOTE — Progress Notes (Signed)
Pt given albuterol nebulizer treatment, tolerated well.  Reports feeling a bit better afterwards, appears to be coughing less.

## 2018-04-08 NOTE — Patient Instructions (Signed)
Chest X-Ray  A chest X-ray is a painless test that uses radiation to create images of the structures inside of your chest. Chest X-rays are used to look for many health conditions, including heart failure, pneumonia, tuberculosis, rib fractures, breathing disorders, and cancer. They may be used to diagnose chest pain, constant coughing, or trouble breathing. Tell a health care provider about:  Any allergies you have.  All medicines you are taking, including vitamins, herbs, eye drops, creams, and over-the-counter medicines.  Any surgeries you have had.  Any medical conditions you have.  Whether you are pregnant or may be pregnant. What are the risks? Getting a chest X-ray is a safe procedure. However, you will be exposed to a small amount of radiation. Being exposed to too much radiation over a lifetime can increase the risk of cancer. This risk is small, but it may occur if you have many X-rays throughout your life. What happens before the procedure?  You may be asked to remove glasses, jewelry, and any other metal objects.  You will be asked to undress from the waist up. You may be given a hospital gown to wear.  You may be asked to wear a protective lead apron to protect parts of your body from radiation. What happens during the procedure?  You will be asked to stand still as each picture is taken to get the best possible images.  You will be asked to take a deep breath and hold your breath for a few seconds.  The X-ray machine will create a picture of your chest using a tiny burst of radiation. This is painless.  More pictures may be taken from other angles. Typically, one picture will be taken while you face the X-ray camera, and another picture will be taken from the side while you stand. If you cannot stand, you may be asked to lie down. The procedure may vary among health care providers and hospitals. What happens after the procedure?  The X-ray(s) will be reviewed by your  health care provider or an X-ray (radiology) specialist.  It is up to you to get your test results. Ask your health care provider, or the department that is doing the test, when your results will be ready.  Your health care provider will tell you if you need more tests or a follow-up exam. Keep all follow-up visits as told by your health care provider. This is important. Summary  A chest X-ray is a safe, painless test that is used to examine the inside of the chest, heart, and lungs.  You will need to undress from the waist up and remove jewelry and metal objects before the procedure.  You will be exposed to a small amount of radiation during the procedure.  The X-ray machine will take one or more pictures of your chest while you remain as still as possible.  Later, a health care provider or specialist will review the test results with you. This information is not intended to replace advice given to you by your health care provider. Make sure you discuss any questions you have with your health care provider. Document Released: 03/19/2016 Document Revised: 03/19/2016 Document Reviewed: 03/19/2016 Elsevier Interactive Patient Education  2019 [employer]  

## 2018-04-08 NOTE — Telephone Encounter (Signed)
The patient states he will need to start treatment for CLL soon and would like to discuss with Dr. Burt Knack his overall cardiovascular status prior to starting.  Scheduled him tomorrow with Dr. Burt Knack to discuss. He was grateful for call and agrees with treatment plan.

## 2018-04-09 ENCOUNTER — Ambulatory Visit: Payer: BLUE CROSS/BLUE SHIELD | Admitting: Cardiovascular Disease

## 2018-04-09 ENCOUNTER — Encounter: Payer: Self-pay | Admitting: Cardiovascular Disease

## 2018-04-09 VITALS — BP 98/60 | HR 72 | Ht 74.0 in | Wt 163.8 lb

## 2018-04-09 DIAGNOSIS — I2510 Atherosclerotic heart disease of native coronary artery without angina pectoris: Secondary | ICD-10-CM

## 2018-04-09 DIAGNOSIS — E782 Mixed hyperlipidemia: Secondary | ICD-10-CM

## 2018-04-09 MED ORDER — ATORVASTATIN CALCIUM 80 MG PO TABS: 80.0000 mg | ORAL_TABLET | Freq: Every day | ORAL | 3 refills | Status: DC

## 2018-04-09 MED ORDER — NITROGLYCERIN 0.4 MG SL SUBL: 0.4000 mg | SUBLINGUAL_TABLET | SUBLINGUAL | 3 refills | Status: DC | PRN

## 2018-04-09 NOTE — Patient Instructions (Signed)
Medication Instructions:  Your provider recommends that you continue on your current medications as directed. Please refer to the Current Medication list given to you today.   If you need a refill on your cardiac medications before your next appointment, please call your pharmacy.    Follow-Up: At [facility], you and your health needs are our priority.  As part of our continuing mission to provide you with exceptional heart care, we have created designated Provider Care Teams.  These Care Teams include your primary Cardiologist (physician) and Advanced Practice Providers (APPs -  Physician Assistants and Nurse Practitioners) who all work together to provide you with the care you need, when you need it. You will need a follow up appointment in:  6 months.  Please call our office 2 months in advance to schedule this appointment.  You may see Michael Cooper, MD or one of the following Advanced Practice Providers on your designated Care Team: Scott Weaver, PA-C Vin Bhagat, PA-C . Janine Hammond, NP 

## 2018-04-09 NOTE — Progress Notes (Signed)
[facility] Progress Note   Jake Kennedy [id number] [birth year] 68 y.o.  Jake Kennedy is managed by Dr. Sullivan Lone  Actively treated with chemotherapy/immunotherapy/hormonal therapy: no  Assessment: Plan:    Cough - Plan: albuterol (PROVENTIL) (2.5 MG/3ML) 0.083% nebulizer solution 2.5 mg, DG Chest 2 View, albuterol (PROVENTIL HFA;VENTOLIN HFA) 108 (90 Base) MCG/ACT inhaler, HYDROcodone-homatropine (HYCODAN) 5-1.5 MG/5ML syrup  Upper respiratory tract infection, unspecified type - Plan: levofloxacin (LEVAQUIN) 750 MG tablet  Chronic lymphocytic leukemia of B-cell type not having achieved remission (HCC)  Steroid-induced hyperglycemia   Cough: Patient was given an albuterol nebulizer and was referred for a chest x-ray which returned showing:  1. No acute disease. 2. Stable irregular right upper lobe nodule, new since 2018. Radiographically, this is concerning for a possible small primary lung carcinoma, less concerning for that on the recent CT. A follow-up chest CT with contrast is recommended in 6-12 months. 3. Mild bronchitic changes.  URI: The patient was given a prescription for Levaquin 750 mg p.o. once daily x7 days, and albuterol inhaler, and Hycodan cough syrup.  Chronic lymphocytic leukemia/small lymphocytic lymphoma: A CBC returned today showing a WBC of 49.9.  This was down from 60.5 when last checked 1 week ago.  The patient has finished a 10-day course of prednisone 50 mg once daily.  He is pending start of chemotherapy and is awaiting to have an appointment scheduled.  Steroid-induced hyperglycemia: The patient's glucose level returned at 168.  This will be rechecked at a later date.  Please see After Visit Summary for patient specific instructions.  No future appointments.  Orders Placed This Encounter  Procedures  . DG Chest 2 View  . Comprehensive metabolic panel       Subjective:   Patient ID:  Jake Kennedy is a 68 y.o. (DOB  06-Mar-1950) male.  Chief Complaint:  Chief Complaint  Patient presents with  . Cough    HPI Jake Kennedy is a 67 year old male with a history of a chronic lymphocytic leukemia/small lymphocytic lymphoma who is followed by Dr. Irene Kennedy.  He has recently finished a 10-day course of prednisone at 50 mg once daily.  He is awaiting the start of chemotherapy.  His labs today show that his white count is lower at 49.9.  This is down from 60.5 when last checked 1 week ago.  He reports that his leg pain and night sweats have resolved.  He presents to clinic today with his wife.  He reports that he is having fatigue, congestion, and ongoing morning cough with thick greenish sputum.  He reports having mild facial and ear congestion that is slightly better with his recent course of steroids.  He had chills this morning and had one episode of shaking chills over an hour earlier.  He continues having dyspnea on exertion and fatigue.  He denies shortness of breath or chest pain.  Medications: I have reviewed the patient's current medications.  Allergies:  Allergies  Allergen Reactions  . Adhesive [Tape]     Blisters & hives - Paper tape ok  . Latex Itching    Blisters and itching    Past Medical History:  Diagnosis Date  . Basal cell carcinoma    moles removed in 2007 and 2012 from L nose and L hip  . BPH (benign prostatic hypertrophy)   . CAD (coronary artery disease), native coronary artery    a. 02/10/2015 95% mid LAD stenosis s/p DES  b. 07/15/16: LHC  showed stable non obst dz and patent stent  . CLL (chronic lymphocytic leukemia) ([place])   . ED (erectile dysfunction)   . Lymphocytosis 02/11/2015   seen by Dr. Lindi Adie 02/11/2015, working up for possible CLL    Past Surgical History:  Procedure Laterality Date  . CARDIAC CATHETERIZATION  02/10/2015  . CARDIAC CATHETERIZATION N/A 02/10/2015   Procedure: Left Heart Cath and Coronary Angiography;  Surgeon: Sherren Mocha, MD;  Location: [facility];   Service: Cardiovascular;  Laterality: N/A;  . CARDIAC CATHETERIZATION N/A 02/10/2015   Procedure: Coronary Stent Intervention;  Surgeon: Sherren Mocha, MD;  Location: [facility];  Service: Cardiovascular;  Laterality: N/A;  . CORONARY STENT PLACEMENT  02/10/2015   DES to LAD  . LAMINECTOMY    . LEFT HEART CATH AND CORONARY ANGIOGRAPHY N/A 07/15/2016   Procedure: Left Heart Cath and Coronary Angiography;  Surgeon: Martinique, Peter M, MD;  Location: [facility];  Service: Cardiovascular;  Laterality: N/A;  . TONSILLECTOMY      Family History  Problem Relation Age of Onset  . Diabetes Mother   . Heart failure Mother   . Heart attack Mother        occured in 37s.   . Healthy Sister   . Healthy Brother   . Healthy Maternal Grandmother   . Healthy Maternal Grandfather   . Healthy Paternal Grandmother   . Healthy Paternal Grandfather   . Healthy Brother     Social History   Socioeconomic History  . Marital status: Married    Spouse name: Not on file  . Number of children: Not on file  . Years of education: Not on file  . Highest education level: Not on file  Occupational History  . Not on file  Social Needs  . Financial resource strain: Not on file  . Food insecurity:    Worry: Not on file    Inability: Not on file  . Transportation needs:    Medical: Not on file    Non-medical: Not on file  Tobacco Use  . Smoking status: Never Smoker  . Smokeless tobacco: Never Used  Substance and Sexual Activity  . Alcohol use: Yes    Alcohol/week: 0.0 standard drinks    Comment: occasional  . Drug use: No  . Sexual activity: Not on file  Lifestyle  . Physical activity:    Days per week: Not on file    Minutes per session: Not on file  . Stress: Not on file  Relationships  . Social connections:    Talks on phone: Not on file    Gets together: Not on file    Attends religious service: Not on file    Active member of club or organization: Not on file    Attends  meetings of clubs or organizations: Not on file    Relationship status: Not on file  . Intimate partner violence:    Fear of current or ex partner: Not on file    Emotionally abused: Not on file    Physically abused: Not on file    Forced sexual activity: Not on file  Other Topics Concern  . Not on file  Social History Narrative  . Not on file    Past Medical History, Surgical history, Social history, and Family history were reviewed and updated as appropriate.   Please see review of systems for further details on the patient's review from today.   Review of Systems:  Review of  Systems  Constitutional: Positive for chills and fatigue. Negative for diaphoresis and fever.  HENT: Positive for congestion. Negative for trouble swallowing and voice change.   Respiratory: Positive for cough. Negative for chest tightness, shortness of breath and wheezing.        Dyspnea on exertion  Cardiovascular: Negative for chest pain and palpitations.  Gastrointestinal: Negative for abdominal pain, constipation, diarrhea, nausea and vomiting.  Genitourinary: Negative for difficulty urinating.  Musculoskeletal: Negative for back pain and myalgias.  Neurological: Negative for dizziness, light-headedness and headaches.    Objective:   Physical Exam:  BP 119/88 (BP Location: Right Arm, Patient Position: Sitting)   Pulse 74   Temp 98.8 F (37.1 C) (Oral)   Resp 18   Ht 6\' 2"  (1.88 m)   Wt 163 lb 9.6 oz (74.2 kg)   SpO2 95%   BMI 21.00 kg/m  ECOG: 1  Physical Exam Constitutional:      General: He is not in acute distress.    Appearance: He is not diaphoretic.  HENT:     Head: Normocephalic and atraumatic.     Right Ear: External ear normal.     Left Ear: External ear normal.     Mouth/Throat:     Pharynx: No oropharyngeal exudate.  Eyes:     General: No scleral icterus.       Right eye: No discharge.        Left eye: No discharge.     Conjunctiva/sclera: Conjunctivae normal.  Neck:       Musculoskeletal: Normal range of motion and neck supple.  Cardiovascular:     Rate and Rhythm: Normal rate and regular rhythm.     Heart sounds: Normal heart sounds. No murmur. No friction rub. No gallop.   Pulmonary:     Effort: Pulmonary effort is normal. No respiratory distress.     Breath sounds: Rhonchi (Bibasilar rhonchi) present. No wheezing or rales.  Lymphadenopathy:     Cervical: No cervical adenopathy.  Skin:    General: Skin is warm and dry.     Findings: No erythema or rash.  Neurological:     Mental Status: He is alert.     Coordination: Coordination normal.  Psychiatric:        Behavior: Behavior normal.        Thought Content: Thought content normal.        Judgment: Judgment normal.     Lab Review:     Component Value Date/Time   NA 137 04/08/2018 1316   NA 144 07/11/2016 1503   NA 141 02/28/2015 1444   K 4.6 04/08/2018 1316   K 4.5 02/28/2015 1444   CL 105 04/08/2018 1316   CO2 28 04/08/2018 1316   CO2 29 02/28/2015 1444   GLUCOSE 168 (H) 04/08/2018 1316   GLUCOSE 97 02/28/2015 1444   BUN 31 (H) 04/08/2018 1316   BUN 21 07/11/2016 1503   BUN 16.1 02/28/2015 1444   CREATININE 0.96 04/08/2018 1316   CREATININE 0.98 04/01/2018 1123   CREATININE 1.0 02/28/2015 1444   CALCIUM 8.1 (L) 04/08/2018 1316   CALCIUM 8.9 02/28/2015 1444   PROT 6.3 (L) 04/08/2018 1316   PROT 5.9 (L) 12/08/2017 1505   PROT 6.6 02/28/2015 1444   ALBUMIN 3.2 (L) 04/08/2018 1316   ALBUMIN 4.0 12/08/2017 1505   ALBUMIN 4.1 02/28/2015 1444   AST 20 04/08/2018 1316   AST 19 04/01/2018 1123   AST 19 02/28/2015 1444   ALT 40 04/08/2018 1316  ALT 48 (H) 04/01/2018 1123   ALT 27 02/28/2015 1444   ALKPHOS 63 04/08/2018 1316   ALKPHOS 62 02/28/2015 1444   BILITOT 0.7 04/08/2018 1316   BILITOT 0.8 04/01/2018 1123   BILITOT 0.66 02/28/2015 1444   GFRNONAA >60 04/08/2018 1316   GFRNONAA >60 04/01/2018 1123   GFRAA >60 04/08/2018 1316   GFRAA >60 04/01/2018 1123        Component Value Date/Time   WBC 49.9 (H) 04/08/2018 1303   WBC 15.1 (H) 03/26/2018 1124   RBC 2.54 (L) 04/08/2018 1303   HGB 9.6 (L) 04/08/2018 1303   HGB 15.7 07/11/2016 1503   HGB 15.1 09/25/2015 1119   HCT 30.1 (L) 04/08/2018 1303   HCT 47.3 07/11/2016 1503   HCT 46.7 09/25/2015 1119   PLT 118 (L) 04/08/2018 1303   PLT 171 07/11/2016 1503   MCV 118.5 (H) 04/08/2018 1303   MCV 96 07/11/2016 1503   MCV 92.9 09/25/2015 1119   MCH 37.8 (H) 04/08/2018 1303   MCHC 31.9 04/08/2018 1303   RDW 17.2 (H) 04/08/2018 1303   RDW 14.1 07/11/2016 1503   RDW 14.3 09/25/2015 1119   LYMPHSABS 0.0 (L) 04/08/2018 1303   LYMPHSABS 9.1 (H) 09/25/2015 1119   MONOABS 0.5 04/08/2018 1303   MONOABS 0.8 09/25/2015 1119   EOSABS 0.0 04/08/2018 1303   EOSABS 0.1 09/25/2015 1119   BASOSABS 0.0 04/08/2018 1303   BASOSABS 0.1 09/25/2015 1119   -------------------------------  Imaging from last 24 hours (if applicable):  Radiology interpretation: Dg Chest 2 View  Result Date: 04/08/2018 CLINICAL DATA:  Productive cough for the past three weeks. Recent diagnosis of chronic lymphocytic leukemia. EXAM: CHEST - 2 VIEW COMPARISON:  Chest CT dated 03/19/2018. Chest radiographs dated 03/06/2018, 07/13/2016 and 02/10/2015. FINDINGS: Normal sized heart. No significant change in an irregular nodular density in the medial aspect of the right upper lobe since 03/06/2018, not seen on the examinations prior to that time. Mild diffuse peribronchial thickening. No airspace consolidation. Lower thoracic spine degenerative changes. IMPRESSION: 1. No acute disease. 2. Stable irregular right upper lobe nodule, new since 2018. Radiographically, this is concerning for a possible small primary lung carcinoma, less concerning for that on the recent CT. A follow-up chest CT with contrast is recommended in 6-12 months. 3. Mild bronchitic changes. These results will be called to the ordering clinician or representative by the  Radiologist Assistant, and communication documented in the PACS or zVision Dashboard. Electronically Signed   By: Claudie Revering M.D.   On: 04/08/2018 16:26   Ct Soft Tissue Neck W Contrast  Result Date: 03/19/2018 CLINICAL DATA:  Chronic lymphocytic leukemia. Localized enlarged lymph nodes. EXAM: CT NECK WITH CONTRAST TECHNIQUE: Multidetector CT imaging of the neck was performed using the standard protocol following the bolus administration of intravenous contrast. CONTRAST:  129mL OMNIPAQUE IOHEXOL 300 MG/ML  SOLN COMPARISON:  None. FINDINGS: Pharynx and larynx: No focal mucosal or submucosal lesions are present. Nasopharynx is clear. Soft palate is within normal limits. Tongue base and palatine tonsils are normal. Oropharynx and hypopharynx are clear. Epiglottis is within normal limits. Vocal cords are midline and symmetric. Salivary glands: The submandibular and parotid glands are normal bilaterally. Thyroid: A 12 mm nodule is present at the lower pole of the left lobe of the thyroid. No other significant nodules are present. Lymph nodes: Multiple bilateral enlarged cervical nodes are present. Submandibular nodes measure up to 2.1 cm on the left and 1.4 cm on the right. A 2.2  cm left level 2 lymph node is present. A 2.1 cm right level 2 lymph node is present. The largest node is a right level 3 node measuring 3.4 x 2.8 x 1.2 cm. Left level 2 node measures 2.1 cm. Numerous smaller level 4, posterior triangle, and supraclavicular nodes are present bilaterally. Vascular: Negative. Limited intracranial: Normal Mastoids and visualized paranasal sinuses: Circumferential mucosal thickening is present in the right maxillary sinus. The visualized left maxillary sinus is clear. Mastoid air cells are clear bilaterally. Skeleton: Endplate degenerative changes are most notable at C3-4, C5-6, and C6-7. Upper chest: Lung apices are degraded by patient motion. No focal lesions are evident. Other: A hypodense lesion in the  right side of the tongue measures 1.5 x 1.1 cm. IMPRESSION: 1. Multiple enlarged bilateral cervical lymph nodes consistent with the given diagnosis of CLL. The largest node is a right level 3 lymph node measuring 3.4 x 2.8 x 1.2 cm. 2. Degenerative changes of the cervical spine. 3. Hypodense lesion in the right side of the tongue measuring up to 1.5 cm. This could represent focal infection. Neoplasm is considered less likely. The lesion appears to be superficial along the right lateral aspect of the anterior tongue a. This should be amenable to direct inspection. Electronically Signed   By: San Morelle M.D.   On: 03/19/2018 14:29   Ct Chest W Contrast  Result Date: 03/19/2018 CLINICAL DATA:  Chronic lymphocytic leukemia. EXAM: CT CHEST, ABDOMEN, AND PELVIS WITH CONTRAST TECHNIQUE: Multidetector CT imaging of the chest, abdomen and pelvis was performed following the standard protocol during bolus administration of intravenous contrast. CONTRAST:  142mL OMNIPAQUE IOHEXOL 300 MG/ML  SOLN COMPARISON:  Chest radiograph of 03/06/2018.  No prior CTs. FINDINGS: CT CHEST FINDINGS Cardiovascular: Aortic atherosclerosis. Normal heart size, without pericardial effusion. Lad coronary stent. Right coronary artery calcification. No central pulmonary embolism, on this non-dedicated study. Mediastinum/Nodes: Bilateral axillary adenopathy, as evidenced by enlarged and increased number of nodes. Example left axillary node of 2.3 cm on image 21/3. Right paratracheal index node of 1.6 cm on image 24/3. Bilateral hilar adenopathy, including a left hilar node of 1.4 cm. Lungs/Pleura: No pleural fluid. Right greater than left and lower lobe predominant peribronchovascular interstitial thickening and nodularity. Underlying pattern of "tree-in-bud" nodularity. Dominant 1.8 cm left lower lobe pulmonary nodule on image 117/7. Right upper lobe peribronchovascular nodule of 1.0 cm on image 39/7. Musculoskeletal: No acute osseous  abnormality. CT ABDOMEN PELVIS FINDINGS Hepatobiliary: Hypoattenuation in segment 4 is likely related to focal hepatic steatosis. Normal gallbladder, without biliary ductal dilatation. Pancreas: Normal, without mass or ductal dilatation. Spleen: Moderate splenomegaly at 14.3 cm craniocaudal. Transverse maximal dimensions 15.0 x 7.6 cm. (Volume = 850) . Adrenals/Urinary Tract: Normal adrenal glands. Normal kidneys, without hydronephrosis. Normal urinary bladder. Stomach/Bowel: Normal stomach, without wall thickening. Normal colon, appendix, and terminal ileum. Normal small bowel. Vascular/Lymphatic: Aortic atherosclerosis. Marked abdominal adenopathy. Left periaortic index node of 2.3 cm on image 78/3. Index ileocolic mesenteric node of 2.1 by 4.3 cm on image 106/3. Index right external iliac node measures 2.1 x 4.5 cm on image 123/3. Reproductive: Mild prostatomegaly. Other: Trace free pelvic fluid is nonspecific. Musculoskeletal: No acute osseous abnormality. IMPRESSION: 1. Marked adenopathy within the chest, abdomen, and pelvis, consistent with active lymphoma/leukemia. 2. Splenomegaly is nonspecific. Splenic involvement cannot be excluded. 3. Right greater than left and basilar predominant peribronchovascular interstitial thickening and nodularity. Considerations include pulmonary involvement of lymphoma and/or infection/aspiration. 4. Coronary artery atherosclerosis. Aortic Atherosclerosis (ICD10-I70.0). 5. Trace  nonspecific pelvic fluid. 6. Electronically Signed   By: Abigail Miyamoto M.D.   On: 03/19/2018 14:17   Ct Abdomen Pelvis W Contrast  Result Date: 03/19/2018 CLINICAL DATA:  Chronic lymphocytic leukemia. EXAM: CT CHEST, ABDOMEN, AND PELVIS WITH CONTRAST TECHNIQUE: Multidetector CT imaging of the chest, abdomen and pelvis was performed following the standard protocol during bolus administration of intravenous contrast. CONTRAST:  133mL OMNIPAQUE IOHEXOL 300 MG/ML  SOLN COMPARISON:  Chest radiograph of  03/06/2018.  No prior CTs. FINDINGS: CT CHEST FINDINGS Cardiovascular: Aortic atherosclerosis. Normal heart size, without pericardial effusion. Lad coronary stent. Right coronary artery calcification. No central pulmonary embolism, on this non-dedicated study. Mediastinum/Nodes: Bilateral axillary adenopathy, as evidenced by enlarged and increased number of nodes. Example left axillary node of 2.3 cm on image 21/3. Right paratracheal index node of 1.6 cm on image 24/3. Bilateral hilar adenopathy, including a left hilar node of 1.4 cm. Lungs/Pleura: No pleural fluid. Right greater than left and lower lobe predominant peribronchovascular interstitial thickening and nodularity. Underlying pattern of "tree-in-bud" nodularity. Dominant 1.8 cm left lower lobe pulmonary nodule on image 117/7. Right upper lobe peribronchovascular nodule of 1.0 cm on image 39/7. Musculoskeletal: No acute osseous abnormality. CT ABDOMEN PELVIS FINDINGS Hepatobiliary: Hypoattenuation in segment 4 is likely related to focal hepatic steatosis. Normal gallbladder, without biliary ductal dilatation. Pancreas: Normal, without mass or ductal dilatation. Spleen: Moderate splenomegaly at 14.3 cm craniocaudal. Transverse maximal dimensions 15.0 x 7.6 cm. (Volume = 850) . Adrenals/Urinary Tract: Normal adrenal glands. Normal kidneys, without hydronephrosis. Normal urinary bladder. Stomach/Bowel: Normal stomach, without wall thickening. Normal colon, appendix, and terminal ileum. Normal small bowel. Vascular/Lymphatic: Aortic atherosclerosis. Marked abdominal adenopathy. Left periaortic index node of 2.3 cm on image 78/3. Index ileocolic mesenteric node of 2.1 by 4.3 cm on image 106/3. Index right external iliac node measures 2.1 x 4.5 cm on image 123/3. Reproductive: Mild prostatomegaly. Other: Trace free pelvic fluid is nonspecific. Musculoskeletal: No acute osseous abnormality. IMPRESSION: 1. Marked adenopathy within the chest, abdomen, and pelvis,  consistent with active lymphoma/leukemia. 2. Splenomegaly is nonspecific. Splenic involvement cannot be excluded. 3. Right greater than left and basilar predominant peribronchovascular interstitial thickening and nodularity. Considerations include pulmonary involvement of lymphoma and/or infection/aspiration. 4. Coronary artery atherosclerosis. Aortic Atherosclerosis (ICD10-I70.0). 5. Trace nonspecific pelvic fluid. 6. Electronically Signed   By: Abigail Miyamoto M.D.   On: 03/19/2018 14:17   [place] Core Biopsy (lymph Nodes)  Result Date: 03/26/2018 INDICATION: 68 year old with CLL and new onset of progressive lymphadenopathy. Request for lymph node biopsy. EXAM: ULTRASOUND-GUIDED LEFT CERVICAL LYMPH NODE BIOPSY MEDICATIONS: None. ANESTHESIA/SEDATION: Moderate (conscious) sedation was employed during this procedure. A total of Versed 2.0 mg and Fentanyl 100 mcg was administered intravenously. Moderate Sedation Time: 18 minutes. The patient's level of consciousness and vital signs were monitored continuously by radiology nursing throughout the procedure under my direct supervision. FLUOROSCOPY TIME:  None COMPLICATIONS: None immediate. PROCEDURE: Informed written consent was obtained from the patient after a thorough discussion of the procedural risks, benefits and alternatives. All questions were addressed. A timeout was performed prior to the initiation of the procedure. Both sides of the neck were evaluated with ultrasound. Multiple enlarged lymph nodes bilaterally. Enlarged lymph node in the upper left neck was targeted for biopsy because it was not close to any large vascular structures. Left side of the neck was prepped with chlorhexidine. Skin was anesthetized with 1% lidocaine. Using ultrasound guidance, 18 gauge core biopsies were obtained from the enlarged cervical lymph node. Total  of 7 core biopsies were obtained. Specimens placed in saline. Bandage placed over the puncture site. FINDINGS: Extensive bilateral  cervical lymphadenopathy. Core biopsies obtained from a left upper cervical lymph node. No bleeding or hematoma formation at end of procedure. IMPRESSION: Successful ultrasound-guided left cervical lymph node biopsy. Electronically Signed   By: Markus Daft M.D.   On: 03/26/2018 15:06        This case was discussed with Dr. Irene Kennedy. He expresses agreement with my management of this patient.

## 2018-04-09 NOTE — Progress Notes (Signed)
Cardiology Office Note:    Date:  04/09/2018   ID:  Jake Kennedy, DOB February 13, 1950, MRN [id number]  PCP:  [name], MD  Cardiologist:  Sherren Mocha, MD  Electrophysiologist:  None   Referring MD: [name], MD   Chief Complaint  Patient presents with  . Coronary Artery Disease    History of Present Illness:    Jake Kennedy is a 68 y.o. male with a hx of coronary artery disease status post stenting to the LAD in 2016, chronic lymphocytic leukemia, hyperlipidemia.  Cardiac catheterization in 2018 demonstrated patent LAD stent and mid RCA 50% stenosis.  The patient was last seen by Richardson Dopp in October 2019.  Since that time, the patient has developed progressive fatigue and lymphadenopathy.  He has CLL and has been followed closely.  He plans to begin treatment in the near future.  He comes in today to discuss treatment options in the context of his coronary artery disease.  The patient is here alone today.  Fortunately he has no anginal symptoms.  He specifically denies chest pain or shortness of breath.  He continues to exercise with no exertional symptoms.  He denies edema, orthopnea, PND, or heart palpitations.  Past Medical History:  Diagnosis Date  . Basal cell carcinoma    moles removed in 2007 and 2012 from L nose and L hip  . BPH (benign prostatic hypertrophy)   . CAD (coronary artery disease), native coronary artery    a. 02/10/2015 95% mid LAD stenosis s/p DES  b. 07/15/16: LHC showed stable non obst dz and patent stent  . CLL (chronic lymphocytic leukemia) ([place])   . ED (erectile dysfunction)   . Lymphocytosis 02/11/2015   seen by Dr. Lindi Adie 02/11/2015, working up for possible CLL    Past Surgical History:  Procedure Laterality Date  . CARDIAC CATHETERIZATION  02/10/2015  . CARDIAC CATHETERIZATION N/A 02/10/2015   Procedure: Left Heart Cath and Coronary Angiography;  Surgeon: Sherren Mocha, MD;  Location: [place] [facility];  Service: Cardiovascular;  Laterality:  N/A;  . CARDIAC CATHETERIZATION N/A 02/10/2015   Procedure: Coronary Stent Intervention;  Surgeon: Sherren Mocha, MD;  Location: [place] [facility];  Service: Cardiovascular;  Laterality: N/A;  . CORONARY STENT PLACEMENT  02/10/2015   DES to LAD  . LAMINECTOMY    . LEFT HEART CATH AND CORONARY ANGIOGRAPHY N/A 07/15/2016   Procedure: Left Heart Cath and Coronary Angiography;  Surgeon: Martinique, Peter M, MD;  Location: [place] [facility];  Service: Cardiovascular;  Laterality: N/A;  . TONSILLECTOMY      Current Medications: Current Meds  Medication Sig  . albuterol (PROVENTIL HFA;VENTOLIN HFA) 108 (90 Base) MCG/ACT inhaler Inhale 2 puffs into the lungs every 6 (six) hours as needed for wheezing or shortness of breath.  Marland Kitchen aspirin 81 MG tablet Take 81 mg by mouth daily.  Marland Kitchen atorvastatin (LIPITOR) 80 MG tablet Take 1 tablet (80 mg total) by mouth daily at 6 PM.  . clotrimazole-betamethasone (LOTRISONE) cream APP EXT AA BID UNTIL RESOLVED  . HYDROcodone-homatropine (HYCODAN) 5-1.5 MG/5ML syrup Take 5 mLs by mouth every 6 (six) hours as needed for cough.  Marland Kitchen ibuprofen (ADVIL,MOTRIN) 200 MG tablet Take 200 mg by mouth every 6 (six) hours as needed.  Marland Kitchen levofloxacin (LEVAQUIN) 750 MG tablet Take 1 tablet (750 mg total) by mouth daily.  . Multiple Vitamin (MULTIVITAMIN) tablet Take 1 tablet by mouth daily.  . Multiple Vitamins-Minerals (PRESERVISION AREDS PO) Take 2 capsules by  mouth daily.  . nitroGLYCERIN (NITROSTAT) 0.4 MG SL tablet Place 1 tablet (0.4 mg total) under the tongue every 5 (five) minutes as needed for chest pain.  . Omega-3 Fatty Acids (FISH OIL CONCENTRATE PO) Take 1 capsule by mouth daily.   . vardenafil (LEVITRA) 10 MG tablet Take 10 mg by mouth as directed.  . [DISCONTINUED] atorvastatin (LIPITOR) 80 MG tablet TAKE 1 TABLET BY MOUTH ONCE DAILY AT 6 PM  . [DISCONTINUED] nitroGLYCERIN (NITROSTAT) 0.4 MG SL tablet Place 1 tablet (0.4 mg total) under the tongue every 5 (five) minutes as  needed for chest pain.     Allergies:   Adhesive [tape] and Latex   Social History   Socioeconomic History  . Marital status: Married    Spouse name: Not on file  . Number of children: Not on file  . Years of education: Not on file  . Highest education level: Not on file  Occupational History  . Not on file  Social Needs  . Financial resource strain: Not on file  . Food insecurity:    Worry: Not on file    Inability: Not on file  . Transportation needs:    Medical: Not on file    Non-medical: Not on file  Tobacco Use  . Smoking status: Never Smoker  . Smokeless tobacco: Never Used  Substance and Sexual Activity  . Alcohol use: Yes    Alcohol/week: 0.0 standard drinks    Comment: occasional  . Drug use: No  . Sexual activity: Not on file  Lifestyle  . Physical activity:    Days per week: Not on file    Minutes per session: Not on file  . Stress: Not on file  Relationships  . Social connections:    Talks on phone: Not on file    Gets together: Not on file    Attends religious service: Not on file    Active member of club or organization: Not on file    Attends meetings of clubs or organizations: Not on file    Relationship status: Not on file  Other Topics Concern  . Not on file  Social History Narrative  . Not on file     Family History: The patient's family history includes Diabetes in his mother; Healthy in his brother, brother, maternal grandfather, maternal grandmother, paternal grandfather, paternal grandmother, and sister; Heart attack in his mother; Heart failure in his mother.  ROS:   Please see the history of present illness.    Positive for weight loss, appetite change, cough, excessive fatigue.  All other systems reviewed and are negative.  EKGs/Labs/Other Studies Reviewed:    The following studies were reviewed today: Cardiac Cath 07/15/2016: Conclusion     Mid RCA lesion, 50 %stenosed.  Mid LAD-2 lesion, 0 %stenosed.  A drug eluting is  in place  Mid LAD-1 lesion, 30 %stenosed.  1st Diag lesion, 30 %stenosed.  The left ventricular systolic function is normal.  LV end diastolic pressure is normal.  The left ventricular ejection fraction is 55-65% by visual estimate.   1. Nonobstructive CAD. The stent in the mid LAD is widely patent. There is 30% narrowing proximal to the stent. The RCA is a large and ectatic vessel with 50% stenosis in the mid vessel. This is unchanged from prior studies. 2. Normal LV function 3. Normal LVEDP.  Plan: no culprit lesion to explain his current chest pain. Recommend continued medical therapy.      EKG:  EKG is not  ordered today.    Recent Labs: 04/08/2018: ALT 40; BUN 31; Creatinine, Ser 0.96; Hemoglobin 9.6; Platelet Count 118; Potassium 4.6; Sodium 137  Recent Lipid Panel    Component Value Date/Time   CHOL 83 (L) 11/11/2017 1010   TRIG 74 11/11/2017 1010   HDL 23 (L) 11/11/2017 1010   CHOLHDL 3.6 11/11/2017 1010   CHOLHDL 3.6 07/14/2016 0406   VLDL 15 07/14/2016 0406   LDLCALC 45 11/11/2017 1010    Physical Exam:    VS:  BP 98/60   Pulse 72   Ht 6\' 2"  (1.88 m)   Wt 163 lb 12.8 oz (74.3 kg)   SpO2 (!) 89%   BMI 21.03 kg/m     Wt Readings from Last 3 Encounters:  04/09/18 163 lb 12.8 oz (74.3 kg)  04/08/18 163 lb 9.6 oz (74.2 kg)  04/01/18 164 lb 4.8 oz (74.5 kg)     GEN:  Thin male in no acute distress HEENT: Normal NECK: No JVD; No carotid bruits LYMPHATICS: No lymphadenopathy CARDIAC: RRR, no murmurs, rubs, gallops RESPIRATORY:  Clear to auscultation without rales, wheezing or rhonchi  ABDOMEN: Soft, non-tender, non-distended MUSCULOSKELETAL:  No edema; No deformity  SKIN: Warm and dry NEUROLOGIC:  Alert and oriented x 3 PSYCHIATRIC:  Normal affect   ASSESSMENT:    1. Mixed hyperlipidemia   2. Coronary artery disease involving native coronary artery of native heart without angina pectoris    PLAN:    In order of problems listed above:  1. The  patient was treated with atorvastatin 80 mg daily. 2. The patient is having no anginal symptoms.  We reviewed his cardiac catheterization films today from both of his previous studies.  His most recent catheterization demonstrated patency of his stent site with no significant restenosis.  He has mild to moderate nonobstructive disease elsewhere which appears stable over serial studies.  He fortunately is having no cardiac-related symptoms at this time and I think we can continue to monitor him with periodic follow-up.  I would like to see him back in 6 months.  No changes are made in his medical program.   Medication Adjustments/Labs and Tests Ordered: Current medicines are reviewed at length with the patient today.  Concerns regarding medicines are outlined above.  No orders of the defined types were placed in this encounter.  Meds ordered this encounter  Medications  . atorvastatin (LIPITOR) 80 MG tablet    Sig: Take 1 tablet (80 mg total) by mouth daily at 6 PM.    Dispense:  90 tablet    Refill:  3  . nitroGLYCERIN (NITROSTAT) 0.4 MG SL tablet    Sig: Place 1 tablet (0.4 mg total) under the tongue every 5 (five) minutes as needed for chest pain.    Dispense:  25 tablet    Refill:  3    Patient Instructions  Medication Instructions:  Your provider recommends that you continue on your current medications as directed. Please refer to the Current Medication list given to you today.   If you need a refill on your cardiac medications before your next appointment, please call your pharmacy.     Follow-Up: At [facility], you and your health needs are our priority.  As part of our continuing mission to provide you with exceptional heart care, we have created designated Provider Care Teams.  These Care Teams include your primary Cardiologist (physician) and Advanced Practice Providers (APPs -  Physician Assistants and Nurse Practitioners) who all work together  to provide you with the care  you need, when you need it. You will need a follow up appointment in:  6 months.  Please call our office 2 months in advance to schedule this appointment.  You may see Sherren Mocha, MD or one of the following Advanced Practice Providers on your designated Care Team: Richardson Dopp, PA-C [place], [place] . Daune Perch, NP       Signed, Sherren Mocha, MD  04/09/2018 10:21 AM    [place]

## 2018-04-15 ENCOUNTER — Telehealth: Payer: Self-pay | Admitting: *Deleted

## 2018-04-15 NOTE — Telephone Encounter (Signed)
Patient called to find out when treatment will start. States he finished his antibiotic yesterday.  Cough continues, using inhaler, productive at times (green sputum).Still fatigued. Told him that Dr. Irene Limbo will be given information. Will call patient back with MD response. Patient verbalized understanding.

## 2018-04-16 ENCOUNTER — Telehealth: Payer: Self-pay | Admitting: *Deleted

## 2018-04-16 NOTE — Telephone Encounter (Signed)
Due to continued cough, Dr. Irene Limbo wants to have patient come for appt next week for evaluation. Msg sent to scheduling. Patient verbalized understanding and knows that scheduling will contact them.

## 2018-04-17 ENCOUNTER — Telehealth: Payer: Self-pay | Admitting: Pharmacist

## 2018-04-17 ENCOUNTER — Other Ambulatory Visit: Payer: Self-pay | Admitting: Hematology

## 2018-04-17 DIAGNOSIS — C9110 Chronic lymphocytic leukemia of B-cell type not having achieved remission: Secondary | ICD-10-CM

## 2018-04-17 MED ORDER — VENETOCLAX 10 & 50 & 100 MG PO TBPK: ORAL_TABLET | ORAL | 0 refills | Status: DC

## 2018-04-17 NOTE — Telephone Encounter (Addendum)
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  Original diagnosis in Jan 2017 Patient has been developing B-symptoms over the past several months and treatment is now indicated.  Venclexta is planned to be dosed on up-titration schedule for CLL Week 1: 41m by mouth once daily with food and water Week 2: 534mby mouth once daily with food and water Week 3: 10034my mouth once daily with food and water Week 4: 200m80m mouth once daily with food and water Week 5: 400mg72mmouth once daily with food and water  400mg 24m daily is target dose of Venclexta for CLL  Labs from Epic assessed, OK for treatment initiation.  Tumor lysis risk assessment: medium tumor burden:  Venclexta can be initiated as an outpatient, it is noted that patient with CrCl < 80 mL/min may be at increased risk of TLS  All lymph nodes <5cm  ALC 48.9 on 04/08/18  04/08/18 SCr=0.96, round to 1.0 for age>65, est CrCl ~ 70mL/m30m2-3 days prior to Venclexta initiation, patient will start allopurinol and oral hydration at 1.5-2L/day Additional IV hydration may be given on dose initiation and some dose ramp up days  Blood chemistry monitoring (complete metabolic panel, LDH, uric acid, phosphorus) will occur for 1st dose of 20mg an56mmg: pr24mse, 6-8 hours post dose administration, and 24 hours  post dose administration  Blood chemistry monitoring will occur pre-dose for subsequent ramp up doses CBC with diff will be checked at least once weekly  Current medication list in Epic reviewed, no DDIs with Venclexta identified.  Prescription will be [facility] and [facility].  Oral Oncology Clinic will continue to follow for  insurance authorization, copayment issues, initial counseling and start date.  Jesse MacJohny Drilling BCPS, BCOP  04/17/2018 10:50 AM Oral Oncology Clinic [phone]

## 2018-04-17 NOTE — Telephone Encounter (Signed)
Oral Oncology Pharmacist Encounter  Prior authorization submitted for Venclexta to [facility] on [email or web address]  Key: [id number] Status is pending.  This encounter will continue to be updated until final determination.  Johny Drilling, PharmD, BCPS, BCOP  04/17/2018 1:22 PM Oral Oncology Clinic [phone]

## 2018-04-20 ENCOUNTER — Other Ambulatory Visit: Payer: Self-pay

## 2018-04-20 ENCOUNTER — Inpatient Hospital Stay (HOSPITAL_BASED_OUTPATIENT_CLINIC_OR_DEPARTMENT_OTHER): Payer: BLUE CROSS/BLUE SHIELD | Admitting: Hematology

## 2018-04-20 ENCOUNTER — Telehealth: Payer: Self-pay | Admitting: Hematology

## 2018-04-20 VITALS — BP 121/66 | HR 96 | Temp 97.7°F | Resp 18 | Ht 74.0 in | Wt 163.7 lb

## 2018-04-20 DIAGNOSIS — C9110 Chronic lymphocytic leukemia of B-cell type not having achieved remission: Secondary | ICD-10-CM | POA: Diagnosis not present

## 2018-04-20 DIAGNOSIS — Z85828 Personal history of other malignant neoplasm of skin: Secondary | ICD-10-CM

## 2018-04-20 DIAGNOSIS — R590 Localized enlarged lymph nodes: Secondary | ICD-10-CM | POA: Diagnosis not present

## 2018-04-20 DIAGNOSIS — R634 Abnormal weight loss: Secondary | ICD-10-CM

## 2018-04-20 DIAGNOSIS — R161 Splenomegaly, not elsewhere classified: Secondary | ICD-10-CM

## 2018-04-20 DIAGNOSIS — Z79899 Other long term (current) drug therapy: Secondary | ICD-10-CM

## 2018-04-20 DIAGNOSIS — E041 Nontoxic single thyroid nodule: Secondary | ICD-10-CM

## 2018-04-20 DIAGNOSIS — I2510 Atherosclerotic heart disease of native coronary artery without angina pectoris: Secondary | ICD-10-CM

## 2018-04-20 DIAGNOSIS — N529 Male erectile dysfunction, unspecified: Secondary | ICD-10-CM | POA: Diagnosis not present

## 2018-04-20 DIAGNOSIS — I700 Atherosclerosis of aorta: Secondary | ICD-10-CM

## 2018-04-20 DIAGNOSIS — T380X5A Adverse effect of glucocorticoids and synthetic analogues, initial encounter: Secondary | ICD-10-CM | POA: Diagnosis not present

## 2018-04-20 DIAGNOSIS — N400 Enlarged prostate without lower urinary tract symptoms: Secondary | ICD-10-CM

## 2018-04-20 DIAGNOSIS — J069 Acute upper respiratory infection, unspecified: Secondary | ICD-10-CM | POA: Diagnosis not present

## 2018-04-20 DIAGNOSIS — J40 Bronchitis, not specified as acute or chronic: Secondary | ICD-10-CM

## 2018-04-20 DIAGNOSIS — R739 Hyperglycemia, unspecified: Secondary | ICD-10-CM | POA: Diagnosis not present

## 2018-04-20 NOTE — Progress Notes (Signed)
HEMATOLOGY/ONCOLOGY CLINIC NOTE  Date of Service: 04/20/2018  Patient Care Team: [name], MD as PCP - General (Internal Medicine) Sherren Mocha, MD as PCP - Cardiology (Cardiology)  CHIEF COMPLAINTS/PURPOSE OF CONSULTATION:  Chronic Lymphocytic Leukemia  Oncologic History:     Chronic lymphocytic leukemia (CLL), B-cell ([place])   02/14/2015 Initial Diagnosis    Monoclonal B-cell population CD5 and CD20 positive, differential diagnosis CLL versus mantle cell lymphoma ( patient had absolute lymphocyte count of 8000 in 2010), remained stable.      HISTORY OF PRESENTING ILLNESS:  Jake Kennedy is a wonderful 68 y.o. male who has been referred to [place] by Dr. [name] for evaluation and management of Chronic Lymphocytic Leukemia. The pt was formerly under the care of my colleague Dr. Nicholas Lose. He is accompanied today by his wife. The pt reports that he is doing well overall.  The pt reports that his course of CLL had been pretty consistent since diagnosis in 2017, until roughly 3 months ago in roughly November 2019. He notes that he has had recent increase in fatigue in the last 3 months, and more acutely in the last 3 weeks. He endorses occasional fevers over the last two weeks during the night, highest at 100.2 or 100.3. He also endorses some night sweats, not every night, some of these being "drenching." He notes that he has been eating well for the last 6 months, however has lost 8 pounds in that interim. He endorses regular exercising until the past month, due to his worsened fatigue, and had previously regularly run upwards of 3 miles and bike for several miles as well.  The pt notes that he can't smell very well, in the last 2-3 weeks, and his hearing has also reduced in the last week. He senses that his enlarged lymph nodes are pressing on his neck, and characterizes this as similar to when one has an acute cold and congestion. He had acute pain in his right hip a week ago  and saw my colleague Sandi Mealy, PA in Symptom Managment. He was given Norco but denies taking this because he didn't want to. He endorses a positional element to his hip pain, which e attributes to the pain being from an enlarged lymph node. He notes that his hip pain had sudden onset on the night of 03/18/18. He notes that his neck lymph nodes have grown in the last 3 weeks to a month. The pt notes that his lymph nodes in his neck, armpits, and groin have been enlarged.  The pt tried an antibiotic course 2 weeks ago with his PCP for a suspected sinus infection, during which his cervical lymph nodes enlarged further. He notes that his neck lymph nodes are somewhat improved today as compared to this. The pt denies frequent infections.   Of note prior to the patient's visit today, pt has had CT N/C/A/P completed on 03/19/18 with results revealing NECK: Multiple enlarged bilateral cervical lymph nodes consistent with the given diagnosis of CLL. The largest node is a right level 3 lymph node measuring 3.4 x 2.8 x 1.2 cm. 2. Degenerative changes of the cervical spine. 3. Hypodense lesion in the right side of the tongue measuring up to 1.5 cm. This could represent focal infection. Neoplasm is considered less likely. The lesion appears to be superficial along the right lateral aspect of the anterior tongue a. This should be amenable to direct inspection, C/A/P: Marked adenopathy within the chest, abdomen, and pelvis, consistent  with active lymphoma/leukemia. 2. Splenomegaly is nonspecific. Splenic involvement cannot be excluded. 3. Right greater than left and basilar predominant peribronchovascular interstitial thickening and nodularity. Considerations include pulmonary involvement of lymphoma and/or Infection/aspiration. 4. Coronary artery atherosclerosis. Aortic Atherosclerosis. 5. Trace nonspecific pelvic fluid.  Most recent lab results (03/17/18) of CBC w/diff and CMP is as follows: all values are WNL except for  WBC at 13.1k, RBC at 2.67, HGB at 10.1, HCT at 31.0, MCV at 116.1, MCH at 37.8, PLT at 103k, ANC at 900, Lymphs abs at 11.1k, BUN at 31, Calcium at 8.7, Total Protein at 6.2, Albumin at 3.4. 03/17/18 LDH at 241  On review of systems, pt reports new fatigue, recent fevers, recent night sweats, recently grown neck lymph nodes, eating well, some weight loss, right groin pain, and denies chills, SOB, ear pain, pain along the spine, frequent infections, flank pain, leg swelling, testicular pain or swelling, and any other symptoms.  Interval History:   [name] returns today for management and evaluation of his CLL. The patient's last visit with [place] was on 04/01/18. He is accompanied today by his wife. The pt reports that he is doing well overall.   In the interim, the pt saw my colleague Sandi Mealy, PA-C on 04/08/18 regarding a cough. The pt reports that he finished his 7 day course of Levaquin about 3 days ago. He notes that he continues coughing but he "no longer feels sick." He notes however that he does feel exhausted. The pt notes that he doesn't have an appetite either and is "forcing food." The pt notes that is mucous continues to be a little green, but "is much less than before." The pt notes that he still feels somewhat SOB, but this too is improved. He is using an albuterol inhaler three times a day. The pt denies fevers or any worsening symptoms of any sort.  On review of systems, pt reports improving cough with some green tinge, fatigue, weak appetite, overall improving, improving breathing, and denies worsening symptoms, fevers, abdominal pains, leg swelling, and any other symptoms.  MEDICAL HISTORY:  Past Medical History:  Diagnosis Date   Basal cell carcinoma    moles removed in 2007 and 2012 from L nose and L hip   BPH (benign prostatic hypertrophy)    CAD (coronary artery disease), native coronary artery    a. 02/10/2015 95% mid LAD stenosis s/p DES  b. 07/15/16: LHC showed stable non  obst dz and patent stent   CLL (chronic lymphocytic leukemia) ([place])    ED (erectile dysfunction)    Lymphocytosis 02/11/2015   seen by Dr. Lindi Adie 02/11/2015, working up for possible CLL    SURGICAL HISTORY: Past Surgical History:  Procedure Laterality Date   CARDIAC CATHETERIZATION  02/10/2015   CARDIAC CATHETERIZATION N/A 02/10/2015   Procedure: Left Heart Cath and Coronary Angiography;  Surgeon: Sherren Mocha, MD;  Location: [facility];  Service: Cardiovascular;  Laterality: N/A;   CARDIAC CATHETERIZATION N/A 02/10/2015   Procedure: Coronary Stent Intervention;  Surgeon: Sherren Mocha, MD;  Location: [facility];  Service: Cardiovascular;  Laterality: N/A;   CORONARY STENT PLACEMENT  02/10/2015   DES to LAD   LAMINECTOMY     LEFT HEART CATH AND CORONARY ANGIOGRAPHY N/A 07/15/2016   Procedure: Left Heart Cath and Coronary Angiography;  Surgeon: Martinique, Peter M, MD;  Location: [facility];  Service: Cardiovascular;  Laterality: N/A;   TONSILLECTOMY      SOCIAL HISTORY:  Social History   Socioeconomic History   Marital status: Married    Spouse name: Not on file   Number of children: Not on file   Years of education: Not on file   Highest education level: Not on file  Occupational History   Not on file  Social Needs   Financial resource strain: Not on file   Food insecurity:    Worry: Not on file    Inability: Not on file   Transportation needs:    Medical: Not on file    Non-medical: Not on file  Tobacco Use   Smoking status: Never Smoker   Smokeless tobacco: Never Used  Substance and Sexual Activity   Alcohol use: Yes    Alcohol/week: 0.0 standard drinks    Comment: occasional   Drug use: No   Sexual activity: Not on file  Lifestyle   Physical activity:    Days per week: Not on file    Minutes per session: Not on file   Stress: Not on file  Relationships   Social connections:    Talks on phone: Not on file    Gets  together: Not on file    Attends religious service: Not on file    Active member of club or organization: Not on file    Attends meetings of clubs or organizations: Not on file    Relationship status: Not on file   Intimate partner violence:    Fear of current or ex partner: Not on file    Emotionally abused: Not on file    Physically abused: Not on file    Forced sexual activity: Not on file  Other Topics Concern   Not on file  Social History Narrative   Not on file    FAMILY HISTORY: Family History  Problem Relation Age of Onset   Diabetes Mother    Heart failure Mother    Heart attack Mother        occured in 45s.    Healthy Sister    Healthy Brother    Healthy Maternal Grandmother    Healthy Maternal Grandfather    Healthy Paternal Grandmother    Healthy Paternal Grandfather    Healthy Brother     ALLERGIES:  is allergic to adhesive [tape] and latex.  MEDICATIONS:  Current Outpatient Medications  Medication Sig Dispense Refill   albuterol (PROVENTIL HFA;VENTOLIN HFA) 108 (90 Base) MCG/ACT inhaler Inhale 2 puffs into the lungs every 6 (six) hours as needed for wheezing or shortness of breath. 1 Inhaler 2   aspirin 81 MG tablet Take 81 mg by mouth daily.     atorvastatin (LIPITOR) 80 MG tablet Take 1 tablet (80 mg total) by mouth daily at 6 PM. 90 tablet 3   clotrimazole-betamethasone (LOTRISONE) cream APP EXT AA BID UNTIL RESOLVED     HYDROcodone-homatropine (HYCODAN) 5-1.5 MG/5ML syrup Take 5 mLs by mouth every 6 (six) hours as needed for cough. 120 mL 0   ibuprofen (ADVIL,MOTRIN) 200 MG tablet Take 200 mg by mouth every 6 (six) hours as needed.     levofloxacin (LEVAQUIN) 750 MG tablet Take 1 tablet (750 mg total) by mouth daily. 7 tablet 0   Multiple Vitamin (MULTIVITAMIN) tablet Take 1 tablet by mouth daily.     Multiple Vitamins-Minerals (PRESERVISION AREDS PO) Take 2 capsules by mouth daily.     nitroGLYCERIN (NITROSTAT) 0.4 MG SL tablet  Place 1 tablet (0.4 mg total) under the tongue every 5 (five) minutes as needed  for chest pain. 25 tablet 3   Omega-3 Fatty Acids (FISH OIL CONCENTRATE PO) Take 1 capsule by mouth daily.      vardenafil (LEVITRA) 10 MG tablet Take 10 mg by mouth as directed.     venetoclax 10 & 50 & 100 MG TBPK Week1: Take 20mg  once daily. MWUX3: Take 50mg  once daily. KGMW1: Take 100mg  once daily. UUVO5: Take 200mg  once daily. Take w food and water. 42 each 0   No current facility-administered medications for this visit.     REVIEW OF SYSTEMS:    A 10+ POINT REVIEW OF SYSTEMS WAS OBTAINED including neurology, dermatology, psychiatry, cardiac, respiratory, lymph, extremities, GI, GU, Musculoskeletal, constitutional, breasts, reproductive, HEENT.  All pertinent positives are noted in the HPI.  All others are negative.   PHYSICAL EXAMINATION: ECOG PERFORMANCE STATUS: 1 - Symptomatic but completely ambulatory  . Vitals:   04/20/18 0940  BP: 121/66  Pulse: 96  Resp: 18  Temp: 97.7 F (36.5 C)  SpO2: 92%   Filed Weights   04/20/18 0940  Weight: 163 lb 11.2 oz (74.3 kg)   .Body mass index is 21.02 kg/m.  GENERAL:alert, in no acute distress and comfortable SKIN: no acute rashes, no significant lesions EYES: conjunctiva are pink and non-injected, sclera anicteric OROPHARYNX: MMM, no exudates, no oropharyngeal erythema or ulceration NECK: supple, no JVD LYMPH: Palpable 2cm lymph nodes in bilateral cervical and axilla. Palpable right inguinal LN 3-4cm. Palpable 1-2cm lymph nodes in left inguinal. LUNGS: scattered rhonchi, no rales HEART: regular rate & rhythm ABDOMEN:  normoactive bowel sounds , non tender, not distended. Palpable splenomegaly 1cm under costal margin.  Extremity: no pedal edema PSYCH: alert & oriented x 3 with fluent speech NEURO: no focal motor/sensory deficits   LABORATORY DATA:  I have reviewed the data as listed  . CBC Latest Ref Rng & Units 04/08/2018 04/01/2018 03/26/2018    WBC 4.0 - 10.5 K/uL 49.9(H) 60.5(HH) 15.1(H)  Hemoglobin 13.0 - 17.0 g/dL 9.6(L) 10.3(L) 10.2(L)  Hematocrit 39.0 - 52.0 % 30.1(L) 32.1(L) 32.8(L)  Platelets 150 - 400 K/uL 118(L) 161 107(L)    . CMP Latest Ref Rng & Units 04/08/2018 04/01/2018 03/17/2018  Glucose 70 - 99 mg/dL 168(H) 115(H) 90  BUN 8 - 23 mg/dL 31(H) 39(H) 31(H)  Creatinine 0.61 - 1.24 mg/dL 0.96 0.98 0.87  Sodium 135 - 145 mmol/L 137 137 139  Potassium 3.5 - 5.1 mmol/L 4.6 4.6 5.1  Chloride 98 - 111 mmol/L 105 101 104  CO2 22 - 32 mmol/L 28 27 28   Calcium 8.9 - 10.3 mg/dL 8.1(L) 8.9 8.7(L)  Total Protein 6.5 - 8.1 g/dL 6.3(L) 6.7 6.2(L)  Total Bilirubin 0.3 - 1.2 mg/dL 0.7 0.8 0.7  Alkaline Phos 38 - 126 U/L 63 80 84  AST 15 - 41 U/L 20 19 23   ALT 0 - 44 U/L 40 48(H) 31                RADIOGRAPHIC STUDIES: I have personally reviewed the radiological images as listed and agreed with the findings in the report. Dg Chest 2 View  Result Date: 04/08/2018 CLINICAL DATA:  Productive cough for the past three weeks. Recent diagnosis of chronic lymphocytic leukemia. EXAM: CHEST - 2 VIEW COMPARISON:  Chest CT dated 03/19/2018. Chest radiographs dated 03/06/2018, 07/13/2016 and 02/10/2015. FINDINGS: Normal sized heart. No significant change in an irregular nodular density in the medial aspect of the right upper lobe since 03/06/2018, not seen on the examinations prior to that  time. Mild diffuse peribronchial thickening. No airspace consolidation. Lower thoracic spine degenerative changes. IMPRESSION: 1. No acute disease. 2. Stable irregular right upper lobe nodule, new since 2018. Radiographically, this is concerning for a possible small primary lung carcinoma, less concerning for that on the recent CT. A follow-up chest CT with contrast is recommended in 6-12 months. 3. Mild bronchitic changes. These results will be called to the ordering clinician or representative by the Radiologist Assistant, and communication  documented in the PACS or zVision Dashboard. Electronically Signed   By: Claudie Revering M.D.   On: 04/08/2018 16:26   [place] Core Biopsy (lymph Nodes)  Result Date: 03/26/2018 INDICATION: 68 year old with CLL and new onset of progressive lymphadenopathy. Request for lymph node biopsy. EXAM: ULTRASOUND-GUIDED LEFT CERVICAL LYMPH NODE BIOPSY MEDICATIONS: None. ANESTHESIA/SEDATION: Moderate (conscious) sedation was employed during this procedure. A total of Versed 2.0 mg and Fentanyl 100 mcg was administered intravenously. Moderate Sedation Time: 18 minutes. The patient's level of consciousness and vital signs were monitored continuously by radiology nursing throughout the procedure under my direct supervision. FLUOROSCOPY TIME:  None COMPLICATIONS: None immediate. PROCEDURE: Informed written consent was obtained from the patient after a thorough discussion of the procedural risks, benefits and alternatives. All questions were addressed. A timeout was performed prior to the initiation of the procedure. Both sides of the neck were evaluated with ultrasound. Multiple enlarged lymph nodes bilaterally. Enlarged lymph node in the upper left neck was targeted for biopsy because it was not close to any large vascular structures. Left side of the neck was prepped with chlorhexidine. Skin was anesthetized with 1% lidocaine. Using ultrasound guidance, 18 gauge core biopsies were obtained from the enlarged cervical lymph node. Total of 7 core biopsies were obtained. Specimens placed in saline. Bandage placed over the puncture site. FINDINGS: Extensive bilateral cervical lymphadenopathy. Core biopsies obtained from a left upper cervical lymph node. No bleeding or hematoma formation at end of procedure. IMPRESSION: Successful ultrasound-guided left cervical lymph node biopsy. Electronically Signed   By: Markus Daft M.D.   On: 03/26/2018 15:06    ASSESSMENT & PLAN:  68 y.o. male with  1. Chronic Lymphocytic Leukemia Initial  diagnosis on 02/13/15 Peripheral blood flow cytometry which revealed a Monoclonal B-cell population 09/25/15 Cytogenetics ruled out Mantle cell lymphoma. 12/25/17 FISH study ruled out translocation 11;14 12/30/17 BM Biopsy revealed CLL without evidence of transformation  03/17/18 FISH CLL Prognostic panel revealed an 11q deletion and Trisomy 12  03/19/18 CT N/C/A/P revealed NECK: Multiple enlarged bilateral cervical lymph nodes consistent with the given diagnosis of CLL. The largest node is a right level 3 lymph node measuring 3.4 x 2.8 x 1.2 cm. 2. Degenerative changes of the cervical spine. 3. Hypodense lesion in the right side of the tongue measuring up to 1.5 cm. This could represent focal infection. Neoplasm is considered less likely. The lesion appears to be superficial along the right lateral aspect of the anterior tongue a. This should be amenable to direct inspection, C/A/P: Marked adenopathy within the chest, abdomen, and pelvis, consistent with active lymphoma/leukemia. 2. Splenomegaly is nonspecific. Splenic involvement cannot be excluded. 3. Right greater than left and basilar predominant peribronchovascular interstitial thickening and nodularity. Considerations include pulmonary involvement of lymphoma and/or Infection/aspiration. 4. Coronary artery atherosclerosis. Aortic Atherosclerosis. 5. Trace nonspecific pelvic fluid.  03/26/18 Left cervical LN biopsy revealed SLL/CLL  PLAN: -Pt has responded to antibiotic treatment. Recommend steam inhalation with distilled water. Continue Albuterol. Recommend sitting up. -Will give the pt 2 weeks  to continue healing from recent bronchitis and possibly early pneumonia, before starting treatment. -If pt feels worse in the interim, he will call me or present to his ED -Planning Venetoclax +/- (Rituxan vs Dyann Kief) -Discussed again that the patient's anemia, neutropenia, thrombocytopenia, recently progressed lymphadenopathy, and new fatigue, fevers, and  drenching night sweats all serve as indications to begin treatment -Discussed that CLL has a tendency to require treatment more than once. -Could be right hip bursitis, do not suspect hip pain is due to lymphadenopathy -Recommended that the pt continue to eat well, drink at least 48-64 oz of water each day, and walk 20-30 minutes each day. -Will see the pt back in 2 weeks   RTC with Dr Irene Limbo with labs in 2 weeks Will submit schedule for Venetoclax related labs and IVF's   All of the patients questions were answered with apparent satisfaction. The patient knows to call the clinic with any problems, questions or concerns.  The total time spent in the appt was 25 minutes and more than 50% was on counseling and direct patient cares.    Sullivan Lone MD MS AAHIVMS [facility] Hematology/Oncology Physician [facility]  (Office):       [phone] (Work cell):  [phone] (Fax):           [phone]  04/20/2018 10:19 AM  I, Baldwin Jamaica, am acting as a scribe for Dr. Sullivan Lone.   ..gkcs .Brunetta Genera MD

## 2018-04-20 NOTE — Patient Instructions (Signed)
Thank you for choosing [facility] to provide your oncology and hematology care.  To afford each patient quality time with our providers, please arrive 30 minutes before your scheduled appointment time.  If you arrive late for your appointment, you may be asked to reschedule.  We strive to give you quality time with our providers, and arriving late affects you and other patients whose appointments are after yours.    If you are a no show for multiple scheduled visits, you may be dismissed from the clinic at the providers discretion.     Again, thank you for choosing [facility], our hope is that these requests will decrease the amount of time that you wait before being seen by our physicians.  ______________________________________________________________________   Should you have questions after your visit to the [facility], please contact our office at [phone] between the hours of 8:30 and 4:30 p.m.    Voicemails left after 4:30p.m will not be returned until the following business day.     For prescription refill requests, please have your pharmacy contact us directly.  Please also try to allow 48 hours for prescription requests.     Please contact the scheduling department for questions regarding scheduling.  For scheduling of procedures such as PET scans, CT scans, MRI, Ultrasound, etc please contact central scheduling at [phone].     Resources For Cancer Patients and Caregivers:    Oncolink.org:  A wonderful resource for patients and healthcare providers for information regarding your disease, ways to tract your treatment, what to expect, etc.      American Cancer Society:  800-227-2345  Can help patients locate various types of support and financial assistance   Cancer Care: 1-800-813-HOPE (4673) Provides financial assistance, online support groups, medication/co-pay assistance.     [place] DSS:  336-641-3447 Where to apply  for food stamps, Medicaid, and utility assistance   Medicare Rights Center: 800-333-4114 Helps people with Medicare understand their rights and benefits, navigate the Medicare system, and secure the quality healthcare they deserve   SCAT: 336-333-6589 [place] [employer]'s shared-ride transportation service for eligible riders who have a disability that prevents them from riding the fixed route bus.     For additional information on assistance programs please contact our social worker:   Jake Kennedy:  [phone]  

## 2018-04-20 NOTE — Telephone Encounter (Signed)
Scheduled appt per 3/16 los.  Per 3/16 los RTC with Dr Irene Limbo with labs in 2 weeks Will submit schedule for Venetoclax related labs and IVF's.

## 2018-04-21 LAB — CBC WITH DIFFERENTIAL (CANCER CENTER ONLY)
Abs Immature Granulocytes: 0 10*3/uL (ref 0.00–0.07)
Basophils Absolute: 0 10*3/uL (ref 0.0–0.1)
Basophils Relative: 0 %
Eosinophils Absolute: 0 10*3/uL (ref 0.0–0.5)
Eosinophils Relative: 0 %
HCT: 30.1 % — ABNORMAL LOW (ref 39.0–52.0)
Hemoglobin: 9.6 g/dL — ABNORMAL LOW (ref 13.0–17.0)
Lymphocytes Relative: 98 %
Lymphs Abs: 48.9 10*3/uL — ABNORMAL HIGH (ref 0.7–4.0)
MCH: 37.8 pg — ABNORMAL HIGH (ref 26.0–34.0)
MCHC: 31.9 g/dL (ref 30.0–36.0)
MCV: 118.5 fL — ABNORMAL HIGH (ref 80.0–100.0)
MONOS PCT: 1 %
Monocytes Absolute: 0.5 10*3/uL (ref 0.1–1.0)
NEUTROS PCT: 0 %
Neutro Abs: 0 10*3/uL — ABNORMAL LOW (ref 1.7–7.7)
Other: 1 %
Platelet Count: 118 10*3/uL — ABNORMAL LOW (ref 150–400)
RBC: 2.54 MIL/uL — ABNORMAL LOW (ref 4.22–5.81)
RDW: 17.2 % — ABNORMAL HIGH (ref 11.5–15.5)
WBC Count: 49.9 10*3/uL — ABNORMAL HIGH (ref 4.0–10.5)
nRBC: 0 % (ref 0.0–0.2)

## 2018-04-23 NOTE — Telephone Encounter (Signed)
Oral Oncology Patient Advocate Encounter  Prior Authorization for [name] has been approved.    PA# [id number] Effective dates: 04/17/18 through 04/16/19  Oral Oncology Clinic will continue to follow.   Jake Kennedy Patient [place] Phone [phone] Fax [phone] 04/23/2018    8:14 AM

## 2018-04-30 DIAGNOSIS — C9110 Chronic lymphocytic leukemia of B-cell type not having achieved remission: Secondary | ICD-10-CM | POA: Diagnosis not present

## 2018-04-30 DIAGNOSIS — Z79899 Other long term (current) drug therapy: Secondary | ICD-10-CM | POA: Diagnosis not present

## 2018-04-30 DIAGNOSIS — E785 Hyperlipidemia, unspecified: Secondary | ICD-10-CM | POA: Diagnosis not present

## 2018-04-30 DIAGNOSIS — Z955 Presence of coronary angioplasty implant and graft: Secondary | ICD-10-CM | POA: Diagnosis not present

## 2018-04-30 DIAGNOSIS — Z7982 Long term (current) use of aspirin: Secondary | ICD-10-CM | POA: Diagnosis not present

## 2018-04-30 DIAGNOSIS — I2510 Atherosclerotic heart disease of native coronary artery without angina pectoris: Secondary | ICD-10-CM | POA: Diagnosis not present

## 2018-05-01 ENCOUNTER — Telehealth: Payer: Self-pay | Admitting: *Deleted

## 2018-05-01 NOTE — Progress Notes (Signed)
HEMATOLOGY/ONCOLOGY CLINIC NOTE  Date of Service: 05/04/2018  Patient Care Team: [name], MD as PCP - General (Internal Medicine) Sherren Mocha, MD as PCP - Cardiology (Cardiology)  CHIEF COMPLAINTS/PURPOSE OF CONSULTATION:  Chronic Lymphocytic Leukemia  Oncologic History:     Chronic lymphocytic leukemia (CLL), B-cell ([place])   02/14/2015 Initial Diagnosis    Monoclonal B-cell population CD5 and CD20 positive, differential diagnosis CLL versus mantle cell lymphoma ( patient had absolute lymphocyte count of 8000 in 2010), remained stable.      HISTORY OF PRESENTING ILLNESS:  Jake Kennedy is a wonderful 68 y.o. male who has been referred to [place] by Dr. [name] for evaluation and management of Chronic Lymphocytic Leukemia. The pt was formerly under the care of my colleague Dr. Nicholas Lose. He is accompanied today by his wife. The pt reports that he is doing well overall.  The pt reports that his course of CLL had been pretty consistent since diagnosis in 2017, until roughly 3 months ago in roughly November 2019. He notes that he has had recent increase in fatigue in the last 3 months, and more acutely in the last 3 weeks. He endorses occasional fevers over the last two weeks during the night, highest at 100.2 or 100.3. He also endorses some night sweats, not every night, some of these being "drenching." He notes that he has been eating well for the last 6 months, however has lost 8 pounds in that interim. He endorses regular exercising until the past month, due to his worsened fatigue, and had previously regularly run upwards of 3 miles and bike for several miles as well.  The pt notes that he can't smell very well, in the last 2-3 weeks, and his hearing has also reduced in the last week. He senses that his enlarged lymph nodes are pressing on his neck, and characterizes this as similar to when one has an acute cold and congestion. He had acute pain in his right hip a week ago  and saw my colleague Sandi Mealy, PA in Symptom Managment. He was given Norco but denies taking this because he didn't want to. He endorses a positional element to his hip pain, which e attributes to the pain being from an enlarged lymph node. He notes that his hip pain had sudden onset on the night of 03/18/18. He notes that his neck lymph nodes have grown in the last 3 weeks to a month. The pt notes that his lymph nodes in his neck, armpits, and groin have been enlarged.  The pt tried an antibiotic course 2 weeks ago with his PCP for a suspected sinus infection, during which his cervical lymph nodes enlarged further. He notes that his neck lymph nodes are somewhat improved today as compared to this. The pt denies frequent infections.   Of note prior to the patient's visit today, pt has had CT N/C/A/P completed on 03/19/18 with results revealing NECK: Multiple enlarged bilateral cervical lymph nodes consistent with the given diagnosis of CLL. The largest node is a right level 3 lymph node measuring 3.4 x 2.8 x 1.2 cm. 2. Degenerative changes of the cervical spine. 3. Hypodense lesion in the right side of the tongue measuring up to 1.5 cm. This could represent focal infection. Neoplasm is considered less likely. The lesion appears to be superficial along the right lateral aspect of the anterior tongue a. This should be amenable to direct inspection, C/A/P: Marked adenopathy within the chest, abdomen, and pelvis, consistent  with active lymphoma/leukemia. 2. Splenomegaly is nonspecific. Splenic involvement cannot be excluded. 3. Right greater than left and basilar predominant peribronchovascular interstitial thickening and nodularity. Considerations include pulmonary involvement of lymphoma and/or Infection/aspiration. 4. Coronary artery atherosclerosis. Aortic Atherosclerosis. 5. Trace nonspecific pelvic fluid.  Most recent lab results (03/17/18) of CBC w/diff and CMP is as follows: all values are WNL except for  WBC at 13.1k, RBC at 2.67, HGB at 10.1, HCT at 31.0, MCV at 116.1, MCH at 37.8, PLT at 103k, ANC at 900, Lymphs abs at 11.1k, BUN at 31, Calcium at 8.7, Total Protein at 6.2, Albumin at 3.4. 03/17/18 LDH at 241  On review of systems, pt reports new fatigue, recent fevers, recent night sweats, recently grown neck lymph nodes, eating well, some weight loss, right groin pain, and denies chills, SOB, ear pain, pain along the spine, frequent infections, flank pain, leg swelling, testicular pain or swelling, and any other symptoms.  Interval History:   Jake Kennedy is visited today via a Phone Visit, utilized for Covid-19 infection prevention strategies, for management and evaluation of his CLL. The patient's last visit with [place] was on 04/20/18. He is accompanied today by his wife on the phone. The pt reports that he is doing well overall.   The pt reports that his cough has improved and is now dry, though he brings up clear phlegm every now and then. He has been using a distilled water steamer and an inhaler, and notes that overall, he is "a lot better." The pt denies any fevers. He has been walking outside some each day, however he notes that he is feeling very tired.  The pt notes that his neck lymph nodes have increased in size. He notes that he has also lost a couple more pounds.  The pt did seek a clinical trial opportunity at [facility] in the interim, but notes that he was slightly too young to enroll in the available trial. The pt has decided to pursue treatment of his CLL here at this time.  Lab results (04/30/18) of CBC w/diff and CMP is as follows: all values are WNL except for WBC at 16.4k, RBC at 2.32, HGB at 8.9, HCT at 26.4, MCV at 113.8, MCH at 38.3, RDW at 18.3, PLT at 90k, Seg abs at 800, Lymphocytes at 15.2k, BUN at 36, Glucose at 108, Albumin at 3.3. 04/30/18 SPEP revealed IgG at 628, IgM at 28, and IgA at 167  On review of systems, pt reports feeling tired, improving cough, increased  neck lymph nodes, some weight loss, low energy levels,  and denies fevers, chills, any other symptoms.  MEDICAL HISTORY:  Past Medical History:  Diagnosis Date   Basal cell carcinoma    moles removed in 2007 and 2012 from L nose and L hip   BPH (benign prostatic hypertrophy)    CAD (coronary artery disease), native coronary artery    a. 02/10/2015 95% mid LAD stenosis s/p DES  b. 07/15/16: LHC showed stable non obst dz and patent stent   CLL (chronic lymphocytic leukemia) ([place])    ED (erectile dysfunction)    Lymphocytosis 02/11/2015   seen by Dr. Lindi Adie 02/11/2015, working up for possible CLL    SURGICAL HISTORY: Past Surgical History:  Procedure Laterality Date   CARDIAC CATHETERIZATION  02/10/2015   CARDIAC CATHETERIZATION N/A 02/10/2015   Procedure: Left Heart Cath and Coronary Angiography;  Surgeon: Sherren Mocha, MD;  Location: [place] [facility];  Service: Cardiovascular;  Laterality: N/A;  CARDIAC CATHETERIZATION N/A 02/10/2015   Procedure: Coronary Stent Intervention;  Surgeon: Sherren Mocha, MD;  Location: [place] [facility];  Service: Cardiovascular;  Laterality: N/A;   CORONARY STENT PLACEMENT  02/10/2015   DES to LAD   LAMINECTOMY     LEFT HEART CATH AND CORONARY ANGIOGRAPHY N/A 07/15/2016   Procedure: Left Heart Cath and Coronary Angiography;  Surgeon: Martinique, Peter M, MD;  Location: [place] [facility];  Service: Cardiovascular;  Laterality: N/A;   TONSILLECTOMY      SOCIAL HISTORY: Social History   Socioeconomic History   Marital status: Married    Spouse name: Not on file   Number of children: Not on file   Years of education: Not on file   Highest education level: Not on file  Occupational History   Not on file  Social Needs   Financial resource strain: Not on file   Food insecurity:    Worry: Not on file    Inability: Not on file   Transportation needs:    Medical: Not on file    Non-medical: Not on file  Tobacco Use   Smoking status:  Never Smoker   Smokeless tobacco: Never Used  Substance and Sexual Activity   Alcohol use: Yes    Alcohol/week: 0.0 standard drinks    Comment: occasional   Drug use: No   Sexual activity: Not on file  Lifestyle   Physical activity:    Days per week: Not on file    Minutes per session: Not on file   Stress: Not on file  Relationships   Social connections:    Talks on phone: Not on file    Gets together: Not on file    Attends religious service: Not on file    Active member of club or organization: Not on file    Attends meetings of clubs or organizations: Not on file    Relationship status: Not on file   Intimate partner violence:    Fear of current or ex partner: Not on file    Emotionally abused: Not on file    Physically abused: Not on file    Forced sexual activity: Not on file  Other Topics Concern   Not on file  Social History Narrative   Not on file    FAMILY HISTORY: Family History  Problem Relation Age of Onset   Diabetes Mother    Heart failure Mother    Heart attack Mother        occured in 50s.    Healthy Sister    Healthy Brother    Healthy Maternal Grandmother    Healthy Maternal Grandfather    Healthy Paternal Grandmother    Healthy Paternal Grandfather    Healthy Brother     ALLERGIES:  is allergic to adhesive [tape] and latex.  MEDICATIONS:  Current Outpatient Medications  Medication Sig Dispense Refill   albuterol (PROVENTIL HFA;VENTOLIN HFA) 108 (90 Base) MCG/ACT inhaler Inhale 2 puffs into the lungs every 6 (six) hours as needed for wheezing or shortness of breath. 1 Inhaler 2   aspirin 81 MG tablet Take 81 mg by mouth daily.     atorvastatin (LIPITOR) 80 MG tablet Take 1 tablet (80 mg total) by mouth daily at 6 PM. 90 tablet 3   clotrimazole-betamethasone (LOTRISONE) cream APP EXT AA BID UNTIL RESOLVED     HYDROcodone-homatropine (HYCODAN) 5-1.5 MG/5ML syrup Take 5 mLs by mouth every 6 (six) hours as needed for  cough. 120 mL 0  ibuprofen (ADVIL,MOTRIN) 200 MG tablet Take 200 mg by mouth every 6 (six) hours as needed.     levofloxacin (LEVAQUIN) 750 MG tablet Take 1 tablet (750 mg total) by mouth daily. 7 tablet 0   Multiple Vitamin (MULTIVITAMIN) tablet Take 1 tablet by mouth daily.     Multiple Vitamins-Minerals (PRESERVISION AREDS PO) Take 2 capsules by mouth daily.     nitroGLYCERIN (NITROSTAT) 0.4 MG SL tablet Place 1 tablet (0.4 mg total) under the tongue every 5 (five) minutes as needed for chest pain. 25 tablet 3   Omega-3 Fatty Acids (FISH OIL CONCENTRATE PO) Take 1 capsule by mouth daily.      vardenafil (LEVITRA) 10 MG tablet Take 10 mg by mouth as directed.     venetoclax 10 & 50 & 100 MG TBPK Week1: Take 20mg  once daily. KZSW1: Take 50mg  once daily. UXNA3: Take 100mg  once daily. FTDD2: Take 200mg  once daily. Take w food and water. 42 each 0   No current facility-administered medications for this visit.     REVIEW OF SYSTEMS:    A 10+ POINT REVIEW OF SYSTEMS WAS OBTAINED including neurology, dermatology, psychiatry, cardiac, respiratory, lymph, extremities, GI, GU, Musculoskeletal, constitutional, breasts, reproductive, HEENT.  All pertinent positives are noted in the HPI.  All others are negative.   PHYSICAL EXAMINATION: ECOG PERFORMANCE STATUS: 1 - Symptomatic but completely ambulatory  . There were no vitals filed for this visit. There were no vitals filed for this visit. .There is no height or weight on file to calculate BMI.   No physical exam, per Phone Visit.  LABORATORY DATA:  I have reviewed the data as listed  . CBC Latest Ref Rng & Units 04/08/2018 04/01/2018 03/26/2018  WBC 4.0 - 10.5 K/uL 49.9(H) 60.5(HH) 15.1(H)  Hemoglobin 13.0 - 17.0 g/dL 9.6(L) 10.3(L) 10.2(L)  Hematocrit 39.0 - 52.0 % 30.1(L) 32.1(L) 32.8(L)  Platelets 150 - 400 K/uL 118(L) 161 107(L)    . CMP Latest Ref Rng & Units 04/08/2018 04/01/2018 03/17/2018  Glucose 70 - 99 mg/dL 168(H) 115(H) 90   BUN 8 - 23 mg/dL 31(H) 39(H) 31(H)  Creatinine 0.61 - 1.24 mg/dL 0.96 0.98 0.87  Sodium 135 - 145 mmol/L 137 137 139  Potassium 3.5 - 5.1 mmol/L 4.6 4.6 5.1  Chloride 98 - 111 mmol/L 105 101 104  CO2 22 - 32 mmol/L 28 27 28   Calcium 8.9 - 10.3 mg/dL 8.1(L) 8.9 8.7(L)  Total Protein 6.5 - 8.1 g/dL 6.3(L) 6.7 6.2(L)  Total Bilirubin 0.3 - 1.2 mg/dL 0.7 0.8 0.7  Alkaline Phos 38 - 126 U/L 63 80 84  AST 15 - 41 U/L 20 19 23   ALT 0 - 44 U/L 40 48(H) 31    04/30/18 Outside Labs:                RADIOGRAPHIC STUDIES: I have personally reviewed the radiological images as listed and agreed with the findings in the report. Dg Chest 2 View  Result Date: 04/08/2018 CLINICAL DATA:  Productive cough for the past three weeks. Recent diagnosis of chronic lymphocytic leukemia. EXAM: CHEST - 2 VIEW COMPARISON:  Chest CT dated 03/19/2018. Chest radiographs dated 03/06/2018, 07/13/2016 and 02/10/2015. FINDINGS: Normal sized heart. No significant change in an irregular nodular density in the medial aspect of the right upper lobe since 03/06/2018, not seen on the examinations prior to that time. Mild diffuse peribronchial thickening. No airspace consolidation. Lower thoracic spine degenerative changes. IMPRESSION: 1. No acute disease. 2. Stable irregular right  upper lobe nodule, new since 2018. Radiographically, this is concerning for a possible small primary lung carcinoma, less concerning for that on the recent CT. A follow-up chest CT with contrast is recommended in 6-12 months. 3. Mild bronchitic changes. These results will be called to the ordering clinician or representative by the Radiologist Assistant, and communication documented in the PACS or zVision Dashboard. Electronically Signed   By: Claudie Revering M.D.   On: 04/08/2018 16:26    ASSESSMENT & PLAN:  68 y.o. male with  1. Chronic Lymphocytic Leukemia Initial diagnosis on 02/13/15 Peripheral blood flow cytometry which revealed a Monoclonal  B-cell population 09/25/15 Cytogenetics ruled out Mantle cell lymphoma. 12/25/17 FISH study ruled out translocation 11;14 12/30/17 BM Biopsy revealed CLL without evidence of transformation  03/17/18 FISH CLL Prognostic panel revealed an 11q deletion and Trisomy 12  03/19/18 CT N/C/A/P revealed NECK: Multiple enlarged bilateral cervical lymph nodes consistent with the given diagnosis of CLL. The largest node is a right level 3 lymph node measuring 3.4 x 2.8 x 1.2 cm. 2. Degenerative changes of the cervical spine. 3. Hypodense lesion in the right side of the tongue measuring up to 1.5 cm. This could represent focal infection. Neoplasm is considered less likely. The lesion appears to be superficial along the right lateral aspect of the anterior tongue a. This should be amenable to direct inspection, C/A/P: Marked adenopathy within the chest, abdomen, and pelvis, consistent with active lymphoma/leukemia. 2. Splenomegaly is nonspecific. Splenic involvement cannot be excluded. 3. Right greater than left and basilar predominant peribronchovascular interstitial thickening and nodularity. Considerations include pulmonary involvement of lymphoma and/or Infection/aspiration. 4. Coronary artery atherosclerosis. Aortic Atherosclerosis. 5. Trace nonspecific pelvic fluid.  03/26/18 Left cervical LN biopsy revealed SLL/CLL  PLAN: -Discussed pt labwork from 04/30/18; WBC down to 16.4k, HGB worsened to 8.9. PLT decreased to 90k. IgG at 628. -Discussed that patient's CLL is likely driving his anemia and thrombocytopenia, though could be an element of suppression due to recent antibiotics and infection -Discussed again that the patient's anemia, neutropenia, thrombocytopenia, recently progressed lymphadenopathy, and new fatigue, fevers, and drenching night sweats all serve as indications to begin treatment -Discussed treatment plan and options again with the pt, and we will tentatively begin C1 Venetoclax +/-(Gazyva or  Rituxan) in one week. -Discussed that CLL has a tendency to require treatment more than once -With lymphocyte counts already having responded appropriately to steroids, will be less threat of tumor lysis syndrome when treatment begins -Nevertheless, we will watch labs 1-2 times per week and will support pt with IVF -Pt has responded to antibiotic treatment. Recommend steam inhalation with distilled water. Continue Albuterol. Recommend sitting up. -Recommended that the pt continue to eat well, drink at least 48-64 oz of water each day, and walk 20-30 minutes each day. -Will repeat SPEP levels to evaluate necessity of IVIG -Will see the pt back in 1-2 weeks   Will set pt up for Ventoclax, lab monitoring, clinic follow ups, and IVF to start in 1-2 weeks   All of the patients questions were answered with apparent satisfaction. The patient knows to call the clinic with any problems, questions or concerns.  The total time spent in the appt was 30 minutes and more than 50% was on counseling and direct patient cares.    Sullivan Lone MD [place] AAHIVMS [facility] Hematology/Oncology Physician [facility]  (Office):       [phone] (Work cell):  [phone] (Fax):  [phone]  05/04/2018 2:24 PM  I, Baldwin Jamaica, am acting as a scribe for Dr. Sullivan Lone.   .I have reviewed the above documentation for accuracy and completeness, and I agree with the above. Brunetta Genera MD

## 2018-05-01 NOTE — Telephone Encounter (Signed)
Contacted patient at Dr. Grier Mitts direction prior to appt on 3/30 to find out if still having respiratory symptoms. Patient states still coughing, productive at times/white sputum, esp in the morning.  Using inhaler. Has a humidifier. No fever. Patient called [facility] regarding a treatment trial and was seen by Dr. Zenda Alpers 3/26. Was not eligible for trial. Discussed treatment options. Had following labs: CBC w/diff, CMP, LDH, Quantitative Immunoglobulin.   Pt would like to keep appt with Dr. Irene Limbo, even though he is still coughing. Advised him that all information will be given to Dr. Irene Limbo. Will contact patient on 3/30 in the morning. Patient verbalized understanding.

## 2018-05-04 ENCOUNTER — Inpatient Hospital Stay (HOSPITAL_BASED_OUTPATIENT_CLINIC_OR_DEPARTMENT_OTHER): Payer: BLUE CROSS/BLUE SHIELD | Admitting: Hematology

## 2018-05-04 ENCOUNTER — Other Ambulatory Visit: Payer: BLUE CROSS/BLUE SHIELD

## 2018-05-04 ENCOUNTER — Telehealth: Payer: Self-pay | Admitting: *Deleted

## 2018-05-04 DIAGNOSIS — Z79899 Other long term (current) drug therapy: Secondary | ICD-10-CM

## 2018-05-04 DIAGNOSIS — Z7982 Long term (current) use of aspirin: Secondary | ICD-10-CM | POA: Diagnosis not present

## 2018-05-04 DIAGNOSIS — C9110 Chronic lymphocytic leukemia of B-cell type not having achieved remission: Secondary | ICD-10-CM | POA: Diagnosis not present

## 2018-05-04 NOTE — Telephone Encounter (Signed)
Per Dr. Irene Limbo - as patient continues to have cough, please change today's appt at 1:40pm to a phone visit. Contacted patient with this information. Patient verbalized understanding. Msg sent to scheduling to change appt.

## 2018-05-05 ENCOUNTER — Telehealth: Payer: Self-pay | Admitting: Hematology

## 2018-05-05 NOTE — Telephone Encounter (Signed)
Dr. Irene Limbo will send a message for the dates for the patient to be scheduled for labs and fluids,  Per 3/30 los

## 2018-05-06 NOTE — Telephone Encounter (Signed)
Oral Oncology Pharmacist Encounter  Received notification from MD that he would like Venclexta to be initiated week on 05/11/2018. Patient with repeat labs assessed on 04/30/18 at [facility].  Tumor lysis risk assessment:  Low/moderate tumor burden: Venclexta will be initiated as an outpatient.  All lymph nodes previously noted to be < 5cm  ALC 15.2 on 04/10/2018  04/30/2018 SCr= 1.06, est CrCl ~ 85mL/min  2-3 days prior to Venclexta initiation, patient will start allopurinol and oral hydration at 1.5-2L/day  Johny Drilling, PharmD, BCPS, BCOP  05/06/2018 12:48 PM Oral Oncology Clinic [phone]

## 2018-05-06 NOTE — Telephone Encounter (Signed)
Oral Chemotherapy Pharmacist Encounter  I spoke with patient and wife over the phone for overview of: Venclexta (venetoclax) for the treatment of chronic lymphocytic leukemia, noted positive for trisomy 12 and ATM mutation, in conjunction with rituximab, planned duration until disease progression or unacceptable toxicity.  Venclexta dose will be titrated up per manufacturer recommendations and rituximab or Dyann Kief may be added to regimen in the future.  Counseled on administration, dosing, side effects, monitoring, drug-food interactions, safe handling, storage, and disposal.  Patient will take Venclexta '20mg'$  tablets, 1 tablet by mouth once daily with food and water for 7 days. Labs will be assessed twice weekly for weeks 1 and 2 then once weekly per MD. CBC, uric acid, electrolytes and LDH will be monitored.  Venclexta start date: TBD, week of 05/11/2018  Patient was instructed to start allopurinol, starting 3 days prior to [facility] initiation. MD has been notified to send allopurinol prescription to [facility] on [address] [facility]  Patient was instructed to increase fluid intake to 1.5 - 2L of water per day starting 2 days prior to [facility] initiation.  Week 2: '50mg'$  by mouth once daily with food and water Week 3: '100mg'$  by mouth once daily with food and water Week 4: '200mg'$  by mouth once daily with food and water Week 5: '400mg'$  by mouth once daily with food and water  Adverse effects include but are not limited to: TLS, decreased blood counts, electrolyte abnormalities, diarrhea, nausea, fatigue, arthralgias/myalgias, and upper respiratory tract infection.    MD has been notified to send prescription for anti-emetic to local [facility]. Patient knows to take it if nausea develops.   Patient will obtain anti diarrheal and alert the office of 4 or more loose stools above baseline.  Reviewed importance of keeping a medication schedule and plan for any missed doses.  Mr. and Mrs.  Vanwingerden voiced understanding and appreciation.   All questions answered. Medication reconciliation performed and medication/allergy list updated.  Insurance authorization for [name] has been approved. Test claim at the pharmacy revealed copayment $0 for first fill of Venclexta. Starter box of Venclexta will ship from the [place] [facility] outpatient pharmacy on Friday, 05/08/2018 for delivery to patient's home on Saturday or Monday.  We will follow-up with the patient for start date of Venclexta once lab appointments have been coordinated.  Patient knows to call the office with questions or concerns. Oral Oncology Clinic will continue to follow.  Johny Drilling, PharmD, BCPS, BCOP  05/06/2018   12:50 PM Oral Oncology Clinic [phone]

## 2018-05-08 ENCOUNTER — Telehealth: Payer: Self-pay | Admitting: *Deleted

## 2018-05-08 ENCOUNTER — Other Ambulatory Visit: Payer: Self-pay | Admitting: Hematology

## 2018-05-08 MED ORDER — ONDANSETRON HCL 8 MG PO TABS: 8.0000 mg | ORAL_TABLET | Freq: Three times a day (TID) | ORAL | 3 refills | Status: DC | PRN

## 2018-05-08 MED ORDER — ALLOPURINOL 100 MG PO TABS: 100.0000 mg | ORAL_TABLET | Freq: Two times a day (BID) | ORAL | 0 refills | Status: DC

## 2018-05-08 MED FILL — VENCLEXTA STARTING PACK: 10 & 50 & 1 | 28 days supply | Qty: 42 | Fill #0

## 2018-05-08 NOTE — Telephone Encounter (Signed)
Patient beginning Venclexta on 4/7. Per instructions from Emeline Darling, [facility] PharmD, labs to be assessed twice a week for the first 2 weeks. PerDr. Irene Limbo, he will see patient at the beignning of the second week. Appt requests sent to scheduling for lab appts at Center on 4/7, 4/10, 4/14, and 4/17, and an appt with Dr. Irene Limbo at 10 am on 4/14. Information and times reviewed with patient. He stated he understood times of appts. Schedule message sent.

## 2018-05-08 NOTE — Telephone Encounter (Signed)
Oral Oncology Pharmacist Encounter  Received call from patient stating that allopurinol and ondansetron prescriptions are ready for pickup from local pharmacy. Patient will start allopurinol tomorrow, 05/09/2018. Venclexta start date set for 05/12/2018. Patient informed that he will receive a call or a message about schedule lab appointments for Venclexta initiation.  Patient states his cough is not yet resolved, however, it is improving daily. He will continue on Mucinex at this time. He will finish out current albuterol inhaler, he thinks it will last him through next week.  He will not refill this inhaler unless he experiences shortness of breath with its discontinuation.  He states he does not think it is helping him brief, only making him cough. Patient denies fever for past several weeks.  All questions answered. Patient knows to contact the office with any additional questions or concerns.  Johny Drilling, PharmD, BCPS, BCOP  05/08/2018 3:47 PM Oral Oncology Clinic [phone]

## 2018-05-11 ENCOUNTER — Other Ambulatory Visit: Payer: Self-pay | Admitting: Pharmacist

## 2018-05-11 DIAGNOSIS — C9110 Chronic lymphocytic leukemia of B-cell type not having achieved remission: Secondary | ICD-10-CM

## 2018-05-11 NOTE — Telephone Encounter (Signed)
Oral Oncology Pharmacist Encounter  Lab follow-up schedule for Venclexta initiation per MD.  Johny Drilling, PharmD, BCPS, BCOP  05/11/2018 8:17 AM Oral Oncology Clinic [phone]

## 2018-05-11 NOTE — Telephone Encounter (Signed)
Oral Oncology Patient Advocate Encounter  Confirmed with [place] that Venclexta was shipped on 05/08/18 with a $0 copay.   [place] Patient [place] Phone [phone] Fax [phone] 05/11/2018   12:48 PM

## 2018-05-12 ENCOUNTER — Telehealth: Payer: Self-pay | Admitting: Hematology

## 2018-05-12 ENCOUNTER — Inpatient Hospital Stay: Payer: BLUE CROSS/BLUE SHIELD | Attending: Hematology and Oncology

## 2018-05-12 ENCOUNTER — Other Ambulatory Visit: Payer: Self-pay

## 2018-05-12 DIAGNOSIS — Z7689 Persons encountering health services in other specified circumstances: Secondary | ICD-10-CM | POA: Diagnosis not present

## 2018-05-12 DIAGNOSIS — K5900 Constipation, unspecified: Secondary | ICD-10-CM | POA: Diagnosis not present

## 2018-05-12 DIAGNOSIS — R161 Splenomegaly, not elsewhere classified: Secondary | ICD-10-CM | POA: Diagnosis not present

## 2018-05-12 DIAGNOSIS — Z79899 Other long term (current) drug therapy: Secondary | ICD-10-CM | POA: Diagnosis not present

## 2018-05-12 DIAGNOSIS — M47812 Spondylosis without myelopathy or radiculopathy, cervical region: Secondary | ICD-10-CM | POA: Diagnosis not present

## 2018-05-12 DIAGNOSIS — Z7982 Long term (current) use of aspirin: Secondary | ICD-10-CM | POA: Diagnosis not present

## 2018-05-12 DIAGNOSIS — C9110 Chronic lymphocytic leukemia of B-cell type not having achieved remission: Secondary | ICD-10-CM | POA: Diagnosis not present

## 2018-05-12 DIAGNOSIS — N400 Enlarged prostate without lower urinary tract symptoms: Secondary | ICD-10-CM | POA: Diagnosis not present

## 2018-05-12 DIAGNOSIS — I2510 Atherosclerotic heart disease of native coronary artery without angina pectoris: Secondary | ICD-10-CM | POA: Diagnosis not present

## 2018-05-12 DIAGNOSIS — Z791 Long term (current) use of non-steroidal anti-inflammatories (NSAID): Secondary | ICD-10-CM | POA: Diagnosis not present

## 2018-05-12 DIAGNOSIS — I700 Atherosclerosis of aorta: Secondary | ICD-10-CM | POA: Diagnosis not present

## 2018-05-12 DIAGNOSIS — J40 Bronchitis, not specified as acute or chronic: Secondary | ICD-10-CM

## 2018-05-12 DIAGNOSIS — Z85828 Personal history of other malignant neoplasm of skin: Secondary | ICD-10-CM | POA: Insufficient documentation

## 2018-05-12 LAB — CBC WITH DIFFERENTIAL/PLATELET
Abs Immature Granulocytes: 0 10*3/uL (ref 0.00–0.07)
Basophils Absolute: 0 10*3/uL (ref 0.0–0.1)
Basophils Relative: 0 %
Eosinophils Absolute: 0 10*3/uL (ref 0.0–0.5)
Eosinophils Relative: 0 %
HCT: 27.8 % — ABNORMAL LOW (ref 39.0–52.0)
Hemoglobin: 8.8 g/dL — ABNORMAL LOW (ref 13.0–17.0)
Lymphocytes Relative: 87 %
Lymphs Abs: 9.4 10*3/uL — ABNORMAL HIGH (ref 0.7–4.0)
MCH: 38.8 pg — ABNORMAL HIGH (ref 26.0–34.0)
MCHC: 31.7 g/dL (ref 30.0–36.0)
MCV: 122.5 fL — ABNORMAL HIGH (ref 80.0–100.0)
Monocytes Absolute: 0.4 10*3/uL (ref 0.1–1.0)
Monocytes Relative: 4 %
Neutro Abs: 1 10*3/uL — ABNORMAL LOW (ref 1.7–17.7)
Neutrophils Relative %: 9 %
Platelets: 85 10*3/uL — ABNORMAL LOW (ref 150–400)
RBC: 2.27 MIL/uL — ABNORMAL LOW (ref 4.22–5.81)
RDW: 18.4 % — ABNORMAL HIGH (ref 11.5–15.5)
WBC: 10.8 10*3/uL — ABNORMAL HIGH (ref 4.0–10.5)
nRBC: 0 % (ref 0.0–0.2)

## 2018-05-12 LAB — COMPREHENSIVE METABOLIC PANEL
ALT: 33 U/L (ref 0–44)
AST: 21 U/L (ref 15–41)
Albumin: 3.4 g/dL — ABNORMAL LOW (ref 3.5–5.0)
Alkaline Phosphatase: 102 U/L (ref 38–126)
Anion gap: 10 (ref 5–15)
BUN: 31 mg/dL — ABNORMAL HIGH (ref 8–23)
CO2: 27 mmol/L (ref 22–32)
Calcium: 8.8 mg/dL — ABNORMAL LOW (ref 8.9–10.3)
Chloride: 102 mmol/L (ref 98–111)
Creatinine, Ser: 1.14 mg/dL (ref 0.61–1.24)
GFR calc Af Amer: >60 mL/min (ref >60)
GFR calc non Af Amer: >60 mL/min (ref >60)
Glucose, Bld: 157 mg/dL — ABNORMAL HIGH (ref 70–99)
Potassium: 4.5 mmol/L (ref 3.5–5.1)
Sodium: 139 mmol/L (ref 135–145)
Total Bilirubin: 0.8 mg/dL (ref 0.3–1.2)
Total Protein: 6.4 g/dL — ABNORMAL LOW (ref 6.5–8.1)

## 2018-05-12 LAB — URIC ACID: Uric Acid, Serum: 3.8 mg/dL (ref 3.7–8.6)

## 2018-05-12 LAB — PHOSPHORUS: Phosphorus: 3.7 mg/dL (ref 2.5–4.6)

## 2018-05-12 LAB — LACTATE DEHYDROGENASE: LDH: 337 U/L — ABNORMAL HIGH (ref 98–192)

## 2018-05-12 NOTE — Telephone Encounter (Signed)
Spoke with patient re April appointments.

## 2018-05-13 ENCOUNTER — Telehealth: Payer: Self-pay | Admitting: *Deleted

## 2018-05-13 LAB — IGG: IgG (Immunoglobin G), Serum: 661 mg/dL (ref 603–1613)

## 2018-05-13 MED ORDER — DEXAMETHASONE 4 MG PO TABS: ORAL_TABLET | ORAL | 0 refills | Status: DC

## 2018-05-13 NOTE — Telephone Encounter (Signed)
Patient called this am - took first dose of Venclexta yesterday. Last night vomited once - no nausea. Overnight had increased temperature, up to 102. Took ibuprofen and it decreased, now 100.8. Asked for advice taking Venclexta this am. Information given to Dr. Irene Limbo. Patient states will increase fluid today by 24-30 oz (was drinking 64 oz). Informed MD of patient information.  Per Dr. Irene Limbo, direct patient as follows: Dexamethasone 4 mg daily, 30-45 minutes prior to [place]. Patient should also take 2 Tylenol 325mg , and Zofran 8mg  at same time as Dexamethasone - 30-45 minutes prior to [place]. Patient should maintain fluids as he stated and contact MD if symptoms do not improve or worsen. Contacted patient with this information. Advised patient hat Dexamethasone will be sent to [place] in his file. Patient verbalized understanding of all instructions.

## 2018-05-14 ENCOUNTER — Encounter: Payer: Self-pay | Admitting: Hematology

## 2018-05-15 ENCOUNTER — Inpatient Hospital Stay: Payer: BLUE CROSS/BLUE SHIELD

## 2018-05-15 ENCOUNTER — Other Ambulatory Visit: Payer: Self-pay

## 2018-05-15 DIAGNOSIS — Z7982 Long term (current) use of aspirin: Secondary | ICD-10-CM | POA: Diagnosis not present

## 2018-05-15 DIAGNOSIS — M47812 Spondylosis without myelopathy or radiculopathy, cervical region: Secondary | ICD-10-CM | POA: Diagnosis not present

## 2018-05-15 DIAGNOSIS — Z79899 Other long term (current) drug therapy: Secondary | ICD-10-CM | POA: Diagnosis not present

## 2018-05-15 DIAGNOSIS — R161 Splenomegaly, not elsewhere classified: Secondary | ICD-10-CM | POA: Diagnosis not present

## 2018-05-15 DIAGNOSIS — I700 Atherosclerosis of aorta: Secondary | ICD-10-CM | POA: Diagnosis not present

## 2018-05-15 DIAGNOSIS — C9110 Chronic lymphocytic leukemia of B-cell type not having achieved remission: Secondary | ICD-10-CM | POA: Diagnosis not present

## 2018-05-15 DIAGNOSIS — Z85828 Personal history of other malignant neoplasm of skin: Secondary | ICD-10-CM | POA: Diagnosis not present

## 2018-05-15 DIAGNOSIS — N400 Enlarged prostate without lower urinary tract symptoms: Secondary | ICD-10-CM | POA: Diagnosis not present

## 2018-05-15 DIAGNOSIS — Z7689 Persons encountering health services in other specified circumstances: Secondary | ICD-10-CM | POA: Diagnosis not present

## 2018-05-15 DIAGNOSIS — Z791 Long term (current) use of non-steroidal anti-inflammatories (NSAID): Secondary | ICD-10-CM | POA: Diagnosis not present

## 2018-05-15 DIAGNOSIS — K5900 Constipation, unspecified: Secondary | ICD-10-CM | POA: Diagnosis not present

## 2018-05-15 DIAGNOSIS — I2510 Atherosclerotic heart disease of native coronary artery without angina pectoris: Secondary | ICD-10-CM | POA: Diagnosis not present

## 2018-05-15 LAB — COMPREHENSIVE METABOLIC PANEL
ALT: 36 U/L (ref 0–44)
AST: 35 U/L (ref 15–41)
Albumin: 3 g/dL — ABNORMAL LOW (ref 3.5–5.0)
Alkaline Phosphatase: 75 U/L (ref 38–126)
Anion gap: 10 (ref 5–15)
BUN: 34 mg/dL — ABNORMAL HIGH (ref 8–23)
CO2: 23 mmol/L (ref 22–32)
Calcium: 8.1 mg/dL — ABNORMAL LOW (ref 8.9–10.3)
Chloride: 103 mmol/L (ref 98–111)
Creatinine, Ser: 1.05 mg/dL (ref 0.61–1.24)
GFR calc Af Amer: >60 mL/min (ref >60)
GFR calc non Af Amer: >60 mL/min (ref >60)
Glucose, Bld: 179 mg/dL — ABNORMAL HIGH (ref 70–99)
Potassium: 4.2 mmol/L (ref 3.5–5.1)
Sodium: 136 mmol/L (ref 135–145)
Total Bilirubin: 0.9 mg/dL (ref 0.3–1.2)
Total Protein: 6.3 g/dL — ABNORMAL LOW (ref 6.5–8.1)

## 2018-05-15 LAB — LACTATE DEHYDROGENASE: LDH: 965 U/L — ABNORMAL HIGH (ref 98–192)

## 2018-05-15 LAB — CBC WITH DIFFERENTIAL/PLATELET
Abs Immature Granulocytes: 0 10*3/uL (ref 0.00–0.07)
Band Neutrophils: 1 %
Basophils Absolute: 0 10*3/uL (ref 0.0–0.1)
Basophils Relative: 0 %
Eosinophils Absolute: 0 10*3/uL (ref 0.0–0.5)
Eosinophils Relative: 0 %
HCT: 25.6 % — ABNORMAL LOW (ref 39.0–52.0)
Hemoglobin: 8.1 g/dL — ABNORMAL LOW (ref 13.0–17.0)
Lymphocytes Relative: 88 %
Lymphs Abs: 7.7 10*3/uL — ABNORMAL HIGH (ref 0.7–4.0)
MCH: 38.4 pg — ABNORMAL HIGH (ref 26.0–34.0)
MCHC: 31.6 g/dL (ref 30.0–36.0)
MCV: 121.3 fL — ABNORMAL HIGH (ref 80.0–100.0)
Monocytes Absolute: 0.2 10*3/uL (ref 0.1–1.0)
Monocytes Relative: 2 %
Neutro Abs: 0.9 10*3/uL — ABNORMAL LOW (ref 1.7–17.7)
Neutrophils Relative %: 9 %
Platelets: 82 10*3/uL — ABNORMAL LOW (ref 150–400)
RBC: 2.11 MIL/uL — ABNORMAL LOW (ref 4.22–5.81)
RDW: 18 % — ABNORMAL HIGH (ref 11.5–15.5)
WBC: 8.8 10*3/uL (ref 4.0–10.5)
nRBC: 0 % (ref 0.0–0.2)

## 2018-05-15 LAB — PHOSPHORUS: Phosphorus: 3.8 mg/dL (ref 2.5–4.6)

## 2018-05-15 LAB — URIC ACID: Uric Acid, Serum: 5 mg/dL (ref 3.7–8.6)

## 2018-05-18 NOTE — Progress Notes (Signed)
HEMATOLOGY/ONCOLOGY CLINIC NOTE  Date of Service: 05/19/2018  Patient Care Team: [name], MD as PCP - General (Internal Medicine) Sherren Mocha, MD as PCP - Cardiology (Cardiology)  CHIEF COMPLAINTS/PURPOSE OF CONSULTATION:  Chronic Lymphocytic Leukemia  Oncologic History:     Chronic lymphocytic leukemia (CLL), B-cell ([place])   02/14/2015 Initial Diagnosis    Monoclonal B-cell population CD5 and CD20 positive, differential diagnosis CLL versus mantle cell lymphoma ( patient had absolute lymphocyte count of 8000 in 2010), remained stable.      HISTORY OF PRESENTING ILLNESS:  Jake Kennedy is a wonderful 68 y.o. male who has been referred to [place] by Dr. [name] for evaluation and management of Chronic Lymphocytic Leukemia. The pt was formerly under the care of my colleague Dr. Nicholas Lose. He is accompanied today by his wife. The pt reports that he is doing well overall.  The pt reports that his course of CLL had been pretty consistent since diagnosis in 2017, until roughly 3 months ago in roughly November 2019. He notes that he has had recent increase in fatigue in the last 3 months, and more acutely in the last 3 weeks. He endorses occasional fevers over the last two weeks during the night, highest at 100.2 or 100.3. He also endorses some night sweats, not every night, some of these being "drenching." He notes that he has been eating well for the last 6 months, however has lost 8 pounds in that interim. He endorses regular exercising until the past month, due to his worsened fatigue, and had previously regularly run upwards of 3 miles and bike for several miles as well.  The pt notes that he can't smell very well, in the last 2-3 weeks, and his hearing has also reduced in the last week. He senses that his enlarged lymph nodes are pressing on his neck, and characterizes this as similar to when one has an acute cold and congestion. He had acute pain in his right hip a week ago  and saw my colleague Sandi Mealy, PA in Symptom Managment. He was given Norco but denies taking this because he didn't want to. He endorses a positional element to his hip pain, which e attributes to the pain being from an enlarged lymph node. He notes that his hip pain had sudden onset on the night of 03/18/18. He notes that his neck lymph nodes have grown in the last 3 weeks to a month. The pt notes that his lymph nodes in his neck, armpits, and groin have been enlarged.  The pt tried an antibiotic course 2 weeks ago with his PCP for a suspected sinus infection, during which his cervical lymph nodes enlarged further. He notes that his neck lymph nodes are somewhat improved today as compared to this. The pt denies frequent infections.   Of note prior to the patient's visit today, pt has had CT N/C/A/P completed on 03/19/18 with results revealing NECK: Multiple enlarged bilateral cervical lymph nodes consistent with the given diagnosis of CLL. The largest node is a right level 3 lymph node measuring 3.4 x 2.8 x 1.2 cm. 2. Degenerative changes of the cervical spine. 3. Hypodense lesion in the right side of the tongue measuring up to 1.5 cm. This could represent focal infection. Neoplasm is considered less likely. The lesion appears to be superficial along the right lateral aspect of the anterior tongue a. This should be amenable to direct inspection, C/A/P: Marked adenopathy within the chest, abdomen, and pelvis, consistent  with active lymphoma/leukemia. 2. Splenomegaly is nonspecific. Splenic involvement cannot be excluded. 3. Right greater than left and basilar predominant peribronchovascular interstitial thickening and nodularity. Considerations include pulmonary involvement of lymphoma and/or Infection/aspiration. 4. Coronary artery atherosclerosis. Aortic Atherosclerosis. 5. Trace nonspecific pelvic fluid.  Most recent lab results (03/17/18) of CBC w/diff and CMP is as follows: all values are WNL except for  WBC at 13.1k, RBC at 2.67, HGB at 10.1, HCT at 31.0, MCV at 116.1, MCH at 37.8, PLT at 103k, ANC at 900, Lymphs abs at 11.1k, BUN at 31, Calcium at 8.7, Total Protein at 6.2, Albumin at 3.4. 03/17/18 LDH at 241  On review of systems, pt reports new fatigue, recent fevers, recent night sweats, recently grown neck lymph nodes, eating well, some weight loss, right groin pain, and denies chills, SOB, ear pain, pain along the spine, frequent infections, flank pain, leg swelling, testicular pain or swelling, and any other symptoms.  Interval History:   Jake Kennedy returns today for management and evaluation of his CLL. The patient's last visit with [place] was on 05/04/18. The pt reports that he is doing well overall.   The pt reports that he has noticed an obvious change in the size of his cervical lymph nodes, and he notes that he is now able to hear normally again. He notes that he still feels "a froggy thing" in his throat but this is much improved. He notes that he had a fever to 101 the first day he took Venetoclax last week, which resolved the next day. He denies fevers since that time. Today was his first day increasing to 50mg  Venetoclax. He notes that he is enjoying improved energy levels. The pt notes that he is consuming 100oz of water a day and is urinating frequently.   The pt notes that he is eating better. He denies diarrhea and skin rashes. He does endorse a little constipation and is now adding on dried fruits to his diet. He denies abdominal pains or leg swelling.  Lab results today (05/19/18) of CBC w/diff and CMP is as follows: all values are WNL except for WBC at 11.8k, RBC at 2.20, HGB at 8.5, HCT at 27.4, MCV at 124.5, MCH at 38.6, RDW at 18.0, PLT at 100k, ANC at 1.2k, Lymphs abs at 10.4k, Glucose at 126, BUN at 29, Calcium at 8.8, Albumin at 3.2, ALT at 96. 05/19/18 Uric acid at 3.3 05/19/18 LDH at 319   05/19/18 Phosphorous at 3.6  On review of systems, pt reports improved energy  levels, improved cervical lymph nodes, hearing better, eating better, resolved fevers, staying well hydrated, urinating frequently, and denies skin rashes, diarrhea, abdominal pains, leg swelling, and any other symptoms.  MEDICAL HISTORY:  Past Medical History:  Diagnosis Date   Basal cell carcinoma    moles removed in 2007 and 2012 from L nose and L hip   BPH (benign prostatic hypertrophy)    CAD (coronary artery disease), native coronary artery    a. 02/10/2015 95% mid LAD stenosis s/p DES  b. 07/15/16: LHC showed stable non obst dz and patent stent   CLL (chronic lymphocytic leukemia) ([place])    ED (erectile dysfunction)    Lymphocytosis 02/11/2015   seen by Dr. Lindi Adie 02/11/2015, working up for possible CLL    SURGICAL HISTORY: Past Surgical History:  Procedure Laterality Date   CARDIAC CATHETERIZATION  02/10/2015   CARDIAC CATHETERIZATION N/A 02/10/2015   Procedure: Left Heart Cath and Coronary Angiography;  Surgeon: Legrand Como  Burt Knack, MD;  Location: [facility];  Service: Cardiovascular;  Laterality: N/A;   CARDIAC CATHETERIZATION N/A 02/10/2015   Procedure: Coronary Stent Intervention;  Surgeon: Sherren Mocha, MD;  Location: [facility];  Service: Cardiovascular;  Laterality: N/A;   CORONARY STENT PLACEMENT  02/10/2015   DES to LAD   LAMINECTOMY     LEFT HEART CATH AND CORONARY ANGIOGRAPHY N/A 07/15/2016   Procedure: Left Heart Cath and Coronary Angiography;  Surgeon: Martinique, Peter M, MD;  Location: [facility];  Service: Cardiovascular;  Laterality: N/A;   TONSILLECTOMY      SOCIAL HISTORY: Social History   Socioeconomic History   Marital status: Married    Spouse name: Not on file   Number of children: Not on file   Years of education: Not on file   Highest education level: Not on file  Occupational History   Not on file  Social Needs   Financial resource strain: Not on file   Food insecurity:    Worry: Not on file    Inability: Not on  file   Transportation needs:    Medical: Not on file    Non-medical: Not on file  Tobacco Use   Smoking status: Never Smoker   Smokeless tobacco: Never Used  Substance and Sexual Activity   Alcohol use: Yes    Alcohol/week: 0.0 standard drinks    Comment: occasional   Drug use: No   Sexual activity: Not on file  Lifestyle   Physical activity:    Days per week: Not on file    Minutes per session: Not on file   Stress: Not on file  Relationships   Social connections:    Talks on phone: Not on file    Gets together: Not on file    Attends religious service: Not on file    Active member of club or organization: Not on file    Attends meetings of clubs or organizations: Not on file    Relationship status: Not on file   Intimate partner violence:    Fear of current or ex partner: Not on file    Emotionally abused: Not on file    Physically abused: Not on file    Forced sexual activity: Not on file  Other Topics Concern   Not on file  Social History Narrative   Not on file    FAMILY HISTORY: Family History  Problem Relation Age of Onset   Diabetes Mother    Heart failure Mother    Heart attack Mother        occured in 32s.    Healthy Sister    Healthy Brother    Healthy Maternal Grandmother    Healthy Maternal Grandfather    Healthy Paternal Grandmother    Healthy Paternal Grandfather    Healthy Brother     ALLERGIES:  is allergic to adhesive [tape] and latex.  MEDICATIONS:  Current Outpatient Medications  Medication Sig Dispense Refill   albuterol (PROVENTIL HFA;VENTOLIN HFA) 108 (90 Base) MCG/ACT inhaler Inhale 2 puffs into the lungs every 6 (six) hours as needed for wheezing or shortness of breath. 1 Inhaler 2   allopurinol (ZYLOPRIM) 100 MG tablet Take 1 tablet (100 mg total) by mouth 2 (two) times daily. 60 tablet 0   aspirin 81 MG tablet Take 81 mg by mouth daily.     atorvastatin (LIPITOR) 80 MG tablet Take 1 tablet (80 mg total)  by mouth daily at 6 PM. 90  tablet 3   clotrimazole-betamethasone (LOTRISONE) cream APP EXT AA BID UNTIL RESOLVED     dexamethasone (DECADRON) 4 MG tablet Take one tablet daily 30-45 minutes prior to [place] with Zofran and acetaminophen. 30 tablet 0   HYDROcodone-homatropine (HYCODAN) 5-1.5 MG/5ML syrup Take 5 mLs by mouth every 6 (six) hours as needed for cough. 120 mL 0   ibuprofen (ADVIL,MOTRIN) 200 MG tablet Take 200 mg by mouth every 6 (six) hours as needed.     levofloxacin (LEVAQUIN) 750 MG tablet Take 1 tablet (750 mg total) by mouth daily. 7 tablet 0   Multiple Vitamin (MULTIVITAMIN) tablet Take 1 tablet by mouth daily.     Multiple Vitamins-Minerals (PRESERVISION AREDS PO) Take 2 capsules by mouth daily.     nitroGLYCERIN (NITROSTAT) 0.4 MG SL tablet Place 1 tablet (0.4 mg total) under the tongue every 5 (five) minutes as needed for chest pain. 25 tablet 3   Omega-3 Fatty Acids (FISH OIL CONCENTRATE PO) Take 1 capsule by mouth daily.      ondansetron (ZOFRAN) 8 MG tablet Take 1 tablet (8 mg total) by mouth every 8 (eight) hours as needed for nausea. 30 tablet 3   traMADol (ULTRAM) 50 MG tablet Take 50 mg by mouth every 6 (six) hours as needed.     vardenafil (LEVITRA) 10 MG tablet Take 10 mg by mouth as directed.     venetoclax 10 & 50 & 100 MG TBPK Week1: Take 20mg  once daily. FYBO1: Take 50mg  once daily. BPZW2: Take 100mg  once daily. HENI7: Take 200mg  once daily. Take w food and water. 42 each 0   No current facility-administered medications for this visit.     REVIEW OF SYSTEMS:    A 10+ POINT REVIEW OF SYSTEMS WAS OBTAINED including neurology, dermatology, psychiatry, cardiac, respiratory, lymph, extremities, GI, GU, Musculoskeletal, constitutional, breasts, reproductive, HEENT.  All pertinent positives are noted in the HPI.  All others are negative.   PHYSICAL EXAMINATION: ECOG PERFORMANCE STATUS: 1 - Symptomatic but completely ambulatory  . Vitals:   05/19/18  1022  BP: 94/69  Pulse: 87  Resp: (!) 8  Temp: 97.7 F (36.5 C)  SpO2: 99%   Filed Weights   05/19/18 1022  Weight: 161 lb 12.8 oz (73.4 kg)   .Body mass index is 20.77 kg/m.   GENERAL:alert, in no acute distress and comfortable SKIN: no acute rashes, no significant lesions EYES: conjunctiva are pink and non-injected, sclera anicteric OROPHARYNX: MMM, no exudates, no oropharyngeal erythema or ulceration NECK: supple, no JVD LYMPH: Palpable 2cm lymph nodes in b/l cervical and axilla. Palpable right inguinal LN 3-4cm. Palpable 1-2cm lymph nodes in left inguinal LUNGS: clear to auscultation b/l with normal respiratory effort HEART: regular rate & rhythm ABDOMEN:  normoactive bowel sounds , non tender, not distended. Palpable splenomegaly 1cm under costal margin.  Extremity: no pedal edema PSYCH: alert & oriented x 3 with fluent speech NEURO: no focal motor/sensory deficits   LABORATORY DATA:  I have reviewed the data as listed  . CBC Latest Ref Rng & Units 05/19/2018 05/15/2018 05/12/2018  WBC 4.0 - 10.5 K/uL 11.8(H) 8.8 10.8(H)  Hemoglobin 13.0 - 17.0 g/dL 8.5(L) 8.1(L) 8.8(L)  Hematocrit 39.0 - 52.0 % 27.4(L) 25.6(L) 27.8(L)  Platelets 150 - 400 K/uL 100(L) 82(L) 85(L)    . CMP Latest Ref Rng & Units 05/19/2018 05/15/2018 05/12/2018  Glucose 70 - 99 mg/dL 126(H) 179(H) 157(H)  BUN 8 - 23 mg/dL 29(H) 34(H) 31(H)  Creatinine 0.61 - 1.24 mg/dL  1.07 1.05 1.14  Sodium 135 - 145 mmol/L 137 136 139  Potassium 3.5 - 5.1 mmol/L 4.3 4.2 4.5  Chloride 98 - 111 mmol/L 100 103 102  CO2 22 - 32 mmol/L 25 23 27   Calcium 8.9 - 10.3 mg/dL 8.8(L) 8.1(L) 8.8(L)  Total Protein 6.5 - 8.1 g/dL 6.6 6.3(L) 6.4(L)  Total Bilirubin 0.3 - 1.2 mg/dL 0.8 0.9 0.8  Alkaline Phos 38 - 126 U/L 76 75 102  AST 15 - 41 U/L 32 35 21  ALT 0 - 44 U/L 96(H) 36 33    04/30/18 Outside Labs:                RADIOGRAPHIC STUDIES: I have personally reviewed the radiological images as listed and  agreed with the findings in the report. No results found.  ASSESSMENT & PLAN:  68 y.o. male with  1. Chronic Lymphocytic Leukemia Initial diagnosis on 02/13/15 Peripheral blood flow cytometry which revealed a Monoclonal B-cell population 09/25/15 Cytogenetics ruled out Mantle cell lymphoma. 12/25/17 FISH study ruled out translocation 11;14 12/30/17 BM Biopsy revealed CLL without evidence of transformation  03/17/18 FISH CLL Prognostic panel revealed an 11q deletion and Trisomy 12  03/19/18 CT N/C/A/P revealed NECK: Multiple enlarged bilateral cervical lymph nodes consistent with the given diagnosis of CLL. The largest node is a right level 3 lymph node measuring 3.4 x 2.8 x 1.2 cm. 2. Degenerative changes of the cervical spine. 3. Hypodense lesion in the right side of the tongue measuring up to 1.5 cm. This could represent focal infection. Neoplasm is considered less likely. The lesion appears to be superficial along the right lateral aspect of the anterior tongue a. This should be amenable to direct inspection, C/A/P: Marked adenopathy within the chest, abdomen, and pelvis, consistent with active lymphoma/leukemia. 2. Splenomegaly is nonspecific. Splenic involvement cannot be excluded. 3. Right greater than left and basilar predominant peribronchovascular interstitial thickening and nodularity. Considerations include pulmonary involvement of lymphoma and/or Infection/aspiration. 4. Coronary artery atherosclerosis. Aortic Atherosclerosis. 5. Trace nonspecific pelvic fluid.  03/26/18 Left cervical LN biopsy revealed SLL/CLL  PLAN: -Discussed pt labwork today, 05/19/18; WBC much improved now to 11.8k, HGB improved to 8.5, PLT improved to 100k. Uric acid at 3.3. LDH improved to 319 -Pt has responded to treatment after his first week  -The pt has no prohibitive toxicities from continuing 50mg  Venetoclax at this time. Continue Tylenol, Zofran, and Dexamethasone pre-meds as well.  -Will continue with slow  weekly dose escalation of Venetoclax and will consider adding Gazyva in the next 2 months -Pt is staying well hydrated, no indication for IVF at this time -Continue weekly labs -Continue Allopurinol- monitor LFTs -Recommended that the pt continue to eat well, drink at least 48-64 oz of water each day, and walk 20-30 minutes each day.  -Will see the pt back in 1 week   May discontinue lab appointment on Friday Labs and MD visit weekly x 4 from 05/26/2018   All of the patients questions were answered with apparent satisfaction. The patient knows to call the clinic with any problems, questions or concerns.  The total time spent in the appt was 30 minutes and more than 50% was on counseling and direct patient cares.    Sullivan Lone MD [place] AAHIVMS [facility] Hematology/Oncology Physician [facility]  (Office):       [phone] (Work cell):  [phone] (Fax):           [phone]  05/19/2018 10:45 AM  I,  Schuyler Bain, am acting as a scribe for Dr. Sullivan Lone.   .I have reviewed the above documentation for accuracy and completeness, and I agree with the above. Brunetta Genera MD

## 2018-05-19 ENCOUNTER — Telehealth: Payer: Self-pay | Admitting: Hematology

## 2018-05-19 ENCOUNTER — Inpatient Hospital Stay (HOSPITAL_BASED_OUTPATIENT_CLINIC_OR_DEPARTMENT_OTHER): Payer: BLUE CROSS/BLUE SHIELD | Admitting: Hematology

## 2018-05-19 ENCOUNTER — Other Ambulatory Visit: Payer: Self-pay

## 2018-05-19 ENCOUNTER — Inpatient Hospital Stay: Payer: BLUE CROSS/BLUE SHIELD

## 2018-05-19 ENCOUNTER — Other Ambulatory Visit: Payer: Self-pay | Admitting: Pharmacist

## 2018-05-19 VITALS — BP 94/69 | HR 87 | Temp 97.7°F | Resp 8 | Ht 74.0 in | Wt 161.8 lb

## 2018-05-19 DIAGNOSIS — N400 Enlarged prostate without lower urinary tract symptoms: Secondary | ICD-10-CM

## 2018-05-19 DIAGNOSIS — M47812 Spondylosis without myelopathy or radiculopathy, cervical region: Secondary | ICD-10-CM | POA: Diagnosis not present

## 2018-05-19 DIAGNOSIS — I700 Atherosclerosis of aorta: Secondary | ICD-10-CM

## 2018-05-19 DIAGNOSIS — Z85828 Personal history of other malignant neoplasm of skin: Secondary | ICD-10-CM

## 2018-05-19 DIAGNOSIS — Z794 Long term (current) use of insulin: Secondary | ICD-10-CM

## 2018-05-19 DIAGNOSIS — Z7982 Long term (current) use of aspirin: Secondary | ICD-10-CM

## 2018-05-19 DIAGNOSIS — K5900 Constipation, unspecified: Secondary | ICD-10-CM

## 2018-05-19 DIAGNOSIS — Z79899 Other long term (current) drug therapy: Secondary | ICD-10-CM | POA: Diagnosis not present

## 2018-05-19 DIAGNOSIS — C9110 Chronic lymphocytic leukemia of B-cell type not having achieved remission: Secondary | ICD-10-CM

## 2018-05-19 DIAGNOSIS — I2510 Atherosclerotic heart disease of native coronary artery without angina pectoris: Secondary | ICD-10-CM | POA: Diagnosis not present

## 2018-05-19 DIAGNOSIS — M4812 Ankylosing hyperostosis [Forestier], cervical region: Secondary | ICD-10-CM

## 2018-05-19 DIAGNOSIS — R161 Splenomegaly, not elsewhere classified: Secondary | ICD-10-CM | POA: Diagnosis not present

## 2018-05-19 DIAGNOSIS — Z7689 Persons encountering health services in other specified circumstances: Secondary | ICD-10-CM | POA: Diagnosis not present

## 2018-05-19 DIAGNOSIS — Z791 Long term (current) use of non-steroidal anti-inflammatories (NSAID): Secondary | ICD-10-CM | POA: Diagnosis not present

## 2018-05-19 DIAGNOSIS — Z9189 Other specified personal risk factors, not elsewhere classified: Secondary | ICD-10-CM

## 2018-05-19 LAB — PHOSPHORUS: Phosphorus: 3.6 mg/dL (ref 2.5–4.6)

## 2018-05-19 LAB — CBC WITH DIFFERENTIAL/PLATELET
Abs Immature Granulocytes: 0 10*3/uL (ref 0.00–0.07)
Band Neutrophils: 1 %
Basophils Absolute: 0 10*3/uL (ref 0.0–0.1)
Basophils Relative: 0 %
Eosinophils Absolute: 0 10*3/uL (ref 0.0–0.5)
Eosinophils Relative: 0 %
HCT: 27.4 % — ABNORMAL LOW (ref 39.0–52.0)
Hemoglobin: 8.5 g/dL — ABNORMAL LOW (ref 13.0–17.0)
Lymphocytes Relative: 88 %
Lymphs Abs: 10.4 10*3/uL — ABNORMAL HIGH (ref 0.7–4.0)
MCH: 38.6 pg — ABNORMAL HIGH (ref 26.0–34.0)
MCHC: 31 g/dL (ref 30.0–36.0)
MCV: 124.5 fL — ABNORMAL HIGH (ref 80.0–100.0)
Monocytes Absolute: 0.2 10*3/uL (ref 0.1–1.0)
Monocytes Relative: 2 %
Neutro Abs: 1.2 10*3/uL — ABNORMAL LOW (ref 1.7–17.7)
Neutrophils Relative %: 9 %
Platelets: 100 10*3/uL — ABNORMAL LOW (ref 150–400)
RBC: 2.2 MIL/uL — ABNORMAL LOW (ref 4.22–5.81)
RDW: 18 % — ABNORMAL HIGH (ref 11.5–15.5)
WBC: 11.8 10*3/uL — ABNORMAL HIGH (ref 4.0–10.5)
nRBC: 0 % (ref 0.0–0.2)

## 2018-05-19 LAB — COMPREHENSIVE METABOLIC PANEL
ALT: 96 U/L — ABNORMAL HIGH (ref 0–44)
AST: 32 U/L (ref 15–41)
Albumin: 3.2 g/dL — ABNORMAL LOW (ref 3.5–5.0)
Alkaline Phosphatase: 76 U/L (ref 38–126)
Anion gap: 12 (ref 5–15)
BUN: 29 mg/dL — ABNORMAL HIGH (ref 8–23)
CO2: 25 mmol/L (ref 22–32)
Calcium: 8.8 mg/dL — ABNORMAL LOW (ref 8.9–10.3)
Chloride: 100 mmol/L (ref 98–111)
Creatinine, Ser: 1.07 mg/dL (ref 0.61–1.24)
GFR calc Af Amer: >60 mL/min (ref >60)
GFR calc non Af Amer: >60 mL/min (ref >60)
Glucose, Bld: 126 mg/dL — ABNORMAL HIGH (ref 70–99)
Potassium: 4.3 mmol/L (ref 3.5–5.1)
Sodium: 137 mmol/L (ref 135–145)
Total Bilirubin: 0.8 mg/dL (ref 0.3–1.2)
Total Protein: 6.6 g/dL (ref 6.5–8.1)

## 2018-05-19 LAB — LACTATE DEHYDROGENASE: LDH: 319 U/L — ABNORMAL HIGH (ref 98–192)

## 2018-05-19 LAB — URIC ACID: Uric Acid, Serum: 3.3 mg/dL — ABNORMAL LOW (ref 3.7–8.6)

## 2018-05-19 NOTE — Telephone Encounter (Signed)
Called and scheduled appt per 4/14 los.  Left a VM of scheduled appt.

## 2018-05-22 ENCOUNTER — Inpatient Hospital Stay: Payer: BLUE CROSS/BLUE SHIELD

## 2018-05-26 ENCOUNTER — Other Ambulatory Visit: Payer: Self-pay

## 2018-05-26 ENCOUNTER — Inpatient Hospital Stay (HOSPITAL_BASED_OUTPATIENT_CLINIC_OR_DEPARTMENT_OTHER): Payer: BLUE CROSS/BLUE SHIELD | Admitting: Hematology

## 2018-05-26 ENCOUNTER — Inpatient Hospital Stay: Payer: BLUE CROSS/BLUE SHIELD

## 2018-05-26 ENCOUNTER — Telehealth: Payer: Self-pay | Admitting: Hematology

## 2018-05-26 VITALS — BP 101/65 | HR 76 | Temp 97.6°F | Resp 17 | Ht 74.0 in | Wt 159.5 lb

## 2018-05-26 DIAGNOSIS — Z79899 Other long term (current) drug therapy: Secondary | ICD-10-CM | POA: Diagnosis not present

## 2018-05-26 DIAGNOSIS — R161 Splenomegaly, not elsewhere classified: Secondary | ICD-10-CM | POA: Diagnosis not present

## 2018-05-26 DIAGNOSIS — I700 Atherosclerosis of aorta: Secondary | ICD-10-CM

## 2018-05-26 DIAGNOSIS — K5900 Constipation, unspecified: Secondary | ICD-10-CM | POA: Diagnosis not present

## 2018-05-26 DIAGNOSIS — Z7689 Persons encountering health services in other specified circumstances: Secondary | ICD-10-CM

## 2018-05-26 DIAGNOSIS — M47812 Spondylosis without myelopathy or radiculopathy, cervical region: Secondary | ICD-10-CM

## 2018-05-26 DIAGNOSIS — Z791 Long term (current) use of non-steroidal anti-inflammatories (NSAID): Secondary | ICD-10-CM

## 2018-05-26 DIAGNOSIS — Z85828 Personal history of other malignant neoplasm of skin: Secondary | ICD-10-CM

## 2018-05-26 DIAGNOSIS — Z7982 Long term (current) use of aspirin: Secondary | ICD-10-CM

## 2018-05-26 DIAGNOSIS — Z9189 Other specified personal risk factors, not elsewhere classified: Secondary | ICD-10-CM

## 2018-05-26 DIAGNOSIS — C9110 Chronic lymphocytic leukemia of B-cell type not having achieved remission: Secondary | ICD-10-CM

## 2018-05-26 DIAGNOSIS — N400 Enlarged prostate without lower urinary tract symptoms: Secondary | ICD-10-CM | POA: Diagnosis not present

## 2018-05-26 DIAGNOSIS — I2510 Atherosclerotic heart disease of native coronary artery without angina pectoris: Secondary | ICD-10-CM | POA: Diagnosis not present

## 2018-05-26 LAB — CBC WITH DIFFERENTIAL/PLATELET
Abs Immature Granulocytes: 0 10*3/uL (ref 0.00–0.07)
Basophils Absolute: 0 10*3/uL (ref 0.0–0.1)
Basophils Relative: 0 %
Eosinophils Absolute: 0 10*3/uL (ref 0.0–0.5)
Eosinophils Relative: 0 %
HCT: 30.5 % — ABNORMAL LOW (ref 39.0–52.0)
Hemoglobin: 9.6 g/dL — ABNORMAL LOW (ref 13.0–17.0)
Lymphocytes Relative: 95 %
Lymphs Abs: 11 10*3/uL — ABNORMAL HIGH (ref 0.7–4.0)
MCH: 39.8 pg — ABNORMAL HIGH (ref 26.0–34.0)
MCHC: 31.5 g/dL (ref 30.0–36.0)
MCV: 126.6 fL — ABNORMAL HIGH (ref 80.0–100.0)
Monocytes Absolute: 0 10*3/uL — ABNORMAL LOW (ref 0.1–1.0)
Monocytes Relative: 0 %
Neutro Abs: 0.6 10*3/uL — ABNORMAL LOW (ref 1.7–17.7)
Neutrophils Relative %: 5 %
Platelets: 104 10*3/uL — ABNORMAL LOW (ref 150–400)
RBC: 2.41 MIL/uL — ABNORMAL LOW (ref 4.22–5.81)
RDW: 19.7 % — ABNORMAL HIGH (ref 11.5–15.5)
WBC: 11.6 10*3/uL — ABNORMAL HIGH (ref 4.0–10.5)
nRBC: 0.2 % (ref 0.0–0.2)

## 2018-05-26 LAB — URIC ACID: Uric Acid, Serum: 3.5 mg/dL — ABNORMAL LOW (ref 3.7–8.6)

## 2018-05-26 LAB — CMP (CANCER CENTER ONLY)
ALT: 42 U/L (ref 0–44)
AST: 13 U/L — ABNORMAL LOW (ref 15–41)
Albumin: 3.4 g/dL — ABNORMAL LOW (ref 3.5–5.0)
Alkaline Phosphatase: 65 U/L (ref 38–126)
Anion gap: 9 (ref 5–15)
BUN: 29 mg/dL — ABNORMAL HIGH (ref 8–23)
CO2: 27 mmol/L (ref 22–32)
Calcium: 8.6 mg/dL — ABNORMAL LOW (ref 8.9–10.3)
Chloride: 101 mmol/L (ref 98–111)
Creatinine: 0.92 mg/dL (ref 0.61–1.24)
GFR, Est AFR Am: >60 mL/min (ref >60)
GFR, Estimated: >60 mL/min (ref >60)
Glucose, Bld: 124 mg/dL — ABNORMAL HIGH (ref 70–99)
Potassium: 4 mmol/L (ref 3.5–5.1)
Sodium: 137 mmol/L (ref 135–145)
Total Bilirubin: 0.8 mg/dL (ref 0.3–1.2)
Total Protein: 6.3 g/dL — ABNORMAL LOW (ref 6.5–8.1)

## 2018-05-26 LAB — PHOSPHORUS: Phosphorus: 3.7 mg/dL (ref 2.5–4.6)

## 2018-05-26 LAB — LACTATE DEHYDROGENASE: LDH: 157 U/L (ref 98–192)

## 2018-05-26 NOTE — Telephone Encounter (Signed)
Per 4/21 los, appt already scheduled. °

## 2018-05-26 NOTE — Progress Notes (Signed)
HEMATOLOGY/ONCOLOGY CLINIC NOTE  Date of Service: 05/26/2018  Patient Care Team: [name], MD as PCP - General (Internal Medicine) Sherren Mocha, MD as PCP - Cardiology (Cardiology)  CHIEF COMPLAINTS/PURPOSE OF CONSULTATION:  Chronic Lymphocytic Leukemia  Oncologic History:     Chronic lymphocytic leukemia (CLL), B-cell ([place])   02/14/2015 Initial Diagnosis    Monoclonal B-cell population CD5 and CD20 positive, differential diagnosis CLL versus mantle cell lymphoma ( patient had absolute lymphocyte count of 8000 in 2010), remained stable.      HISTORY OF PRESENTING ILLNESS:  Jake Kennedy is a wonderful 68 y.o. male who has been referred to [place] by Dr. [name] for evaluation and management of Chronic Lymphocytic Leukemia. The pt was formerly under the care of my colleague Dr. Nicholas Lose. He is accompanied today by his wife. The pt reports that he is doing well overall.  The pt reports that his course of CLL had been pretty consistent since diagnosis in 2017, until roughly 3 months ago in roughly November 2019. He notes that he has had recent increase in fatigue in the last 3 months, and more acutely in the last 3 weeks. He endorses occasional fevers over the last two weeks during the night, highest at 100.2 or 100.3. He also endorses some night sweats, not every night, some of these being "drenching." He notes that he has been eating well for the last 6 months, however has lost 8 pounds in that interim. He endorses regular exercising until the past month, due to his worsened fatigue, and had previously regularly run upwards of 3 miles and bike for several miles as well.  The pt notes that he can't smell very well, in the last 2-3 weeks, and his hearing has also reduced in the last week. He senses that his enlarged lymph nodes are pressing on his neck, and characterizes this as similar to when one has an acute cold and congestion. He had acute pain in his right hip a week ago  and saw my colleague Sandi Mealy, PA in Symptom Managment. He was given Norco but denies taking this because he didn't want to. He endorses a positional element to his hip pain, which e attributes to the pain being from an enlarged lymph node. He notes that his hip pain had sudden onset on the night of 03/18/18. He notes that his neck lymph nodes have grown in the last 3 weeks to a month. The pt notes that his lymph nodes in his neck, armpits, and groin have been enlarged.  The pt tried an antibiotic course 2 weeks ago with his PCP for a suspected sinus infection, during which his cervical lymph nodes enlarged further. He notes that his neck lymph nodes are somewhat improved today as compared to this. The pt denies frequent infections.   Of note prior to the patient's visit today, pt has had CT N/C/A/P completed on 03/19/18 with results revealing NECK: Multiple enlarged bilateral cervical lymph nodes consistent with the given diagnosis of CLL. The largest node is a right level 3 lymph node measuring 3.4 x 2.8 x 1.2 cm. 2. Degenerative changes of the cervical spine. 3. Hypodense lesion in the right side of the tongue measuring up to 1.5 cm. This could represent focal infection. Neoplasm is considered less likely. The lesion appears to be superficial along the right lateral aspect of the anterior tongue a. This should be amenable to direct inspection, C/A/P: Marked adenopathy within the chest, abdomen, and pelvis, consistent  with active lymphoma/leukemia. 2. Splenomegaly is nonspecific. Splenic involvement cannot be excluded. 3. Right greater than left and basilar predominant peribronchovascular interstitial thickening and nodularity. Considerations include pulmonary involvement of lymphoma and/or Infection/aspiration. 4. Coronary artery atherosclerosis. Aortic Atherosclerosis. 5. Trace nonspecific pelvic fluid.  Most recent lab results (03/17/18) of CBC w/diff and CMP is as follows: all values are WNL except for  WBC at 13.1k, RBC at 2.67, HGB at 10.1, HCT at 31.0, MCV at 116.1, MCH at 37.8, PLT at 103k, ANC at 900, Lymphs abs at 11.1k, BUN at 31, Calcium at 8.7, Total Protein at 6.2, Albumin at 3.4. 03/17/18 LDH at 241  On review of systems, pt reports new fatigue, recent fevers, recent night sweats, recently grown neck lymph nodes, eating well, some weight loss, right groin pain, and denies chills, SOB, ear pain, pain along the spine, frequent infections, flank pain, leg swelling, testicular pain or swelling, and any other symptoms.  Interval History:   Jake Kennedy returns today for management and evaluation of his CLL. The patient's last visit with [place] was on 05/19/18. The pt reports that he is doing well overall.  The pt reports that he has continued to feel better each day. He notes that his "stamina is improving," and endorses that his scratchy throat continues resolving. He denies nausea or diarrhea. He notes that he had one occasion in the interim when he got up at night to use the restroom, felt dizzy and fell without injuring himself. He notes that he has had a couple occasions in which he felt dizzy/light headed. He has continued on Zofran. His BP today is 101/65. The pt notes that his appetite has returned and is "eating better and better." Overall, the notes that he feels "much better," as compared to a couple weeks ago. He denies skin rashes, leg swelling or abdominal pains.  Lab results today (05/26/18) of CBC w/diff and CMP is as follows: all values are WNL except for WBC at 11.6k, RBC at 2.41, HGB at 9.6, HCT at 30.5, MCV at 126.6, MCH at 39.8, RDW at 19.7, PLT at 104k, ANC at 600, Lymphs abs at 11k, Monocytes at 0.0k, Glucose at 124, BUN at 29, Calcium at 8.6, Total Protein at 6.3, Albumin at 3.4, AST at 13. 05/26/18 Uric acid at 3.5 05/26/18 LDH at 157 05/26/18 Phosphorous is pending  On review of systems, pt reports improved energy levels, staying hydrated, occasional light headedness, eating  better, stronger appetite, hearing well, and denies fevers, chills, diarrhea, nausea, abdominal pain, leg swelling, skin rashes, and any other symptoms.   MEDICAL HISTORY:  Past Medical History:  Diagnosis Date   Basal cell carcinoma    moles removed in 2007 and 2012 from L nose and L hip   BPH (benign prostatic hypertrophy)    CAD (coronary artery disease), native coronary artery    a. 02/10/2015 95% mid LAD stenosis s/p DES  b. 07/15/16: LHC showed stable non obst dz and patent stent   CLL (chronic lymphocytic leukemia) ([place])    ED (erectile dysfunction)    Lymphocytosis 02/11/2015   seen by Dr. Lindi Adie 02/11/2015, working up for possible CLL    SURGICAL HISTORY: Past Surgical History:  Procedure Laterality Date   CARDIAC CATHETERIZATION  02/10/2015   CARDIAC CATHETERIZATION N/A 02/10/2015   Procedure: Left Heart Cath and Coronary Angiography;  Surgeon: Sherren Mocha, MD;  Location: [facility];  Service: Cardiovascular;  Laterality: N/A;   CARDIAC CATHETERIZATION N/A 02/10/2015  Procedure: Coronary Stent Intervention;  Surgeon: Sherren Mocha, MD;  Location: [facility];  Service: Cardiovascular;  Laterality: N/A;   CORONARY STENT PLACEMENT  02/10/2015   DES to LAD   LAMINECTOMY     LEFT HEART CATH AND CORONARY ANGIOGRAPHY N/A 07/15/2016   Procedure: Left Heart Cath and Coronary Angiography;  Surgeon: Martinique, Peter M, MD;  Location: [facility];  Service: Cardiovascular;  Laterality: N/A;   TONSILLECTOMY      SOCIAL HISTORY: Social History   Socioeconomic History   Marital status: Married    Spouse name: Not on file   Number of children: Not on file   Years of education: Not on file   Highest education level: Not on file  Occupational History   Not on file  Social Needs   Financial resource strain: Not on file   Food insecurity:    Worry: Not on file    Inability: Not on file   Transportation needs:    Medical: Not on file     Non-medical: Not on file  Tobacco Use   Smoking status: Never Smoker   Smokeless tobacco: Never Used  Substance and Sexual Activity   Alcohol use: Yes    Alcohol/week: 0.0 standard drinks    Comment: occasional   Drug use: No   Sexual activity: Not on file  Lifestyle   Physical activity:    Days per week: Not on file    Minutes per session: Not on file   Stress: Not on file  Relationships   Social connections:    Talks on phone: Not on file    Gets together: Not on file    Attends religious service: Not on file    Active member of club or organization: Not on file    Attends meetings of clubs or organizations: Not on file    Relationship status: Not on file   Intimate partner violence:    Fear of current or ex partner: Not on file    Emotionally abused: Not on file    Physically abused: Not on file    Forced sexual activity: Not on file  Other Topics Concern   Not on file  Social History Narrative   Not on file    FAMILY HISTORY: Family History  Problem Relation Age of Onset   Diabetes Mother    Heart failure Mother    Heart attack Mother        occured in 36s.    Healthy Sister    Healthy Brother    Healthy Maternal Grandmother    Healthy Maternal Grandfather    Healthy Paternal Grandmother    Healthy Paternal Grandfather    Healthy Brother     ALLERGIES:  is allergic to adhesive [tape] and latex.  MEDICATIONS:  Current Outpatient Medications  Medication Sig Dispense Refill   albuterol (PROVENTIL HFA;VENTOLIN HFA) 108 (90 Base) MCG/ACT inhaler Inhale 2 puffs into the lungs every 6 (six) hours as needed for wheezing or shortness of breath. 1 Inhaler 2   allopurinol (ZYLOPRIM) 100 MG tablet Take 1 tablet (100 mg total) by mouth 2 (two) times daily. 60 tablet 0   aspirin 81 MG tablet Take 81 mg by mouth daily.     atorvastatin (LIPITOR) 80 MG tablet Take 1 tablet (80 mg total) by mouth daily at 6 PM. 90 tablet 3    clotrimazole-betamethasone (LOTRISONE) cream APP EXT AA BID UNTIL RESOLVED     dexamethasone (DECADRON) 4 MG tablet  Take one tablet daily 30-45 minutes prior to [place] with Zofran and acetaminophen. 30 tablet 0   HYDROcodone-homatropine (HYCODAN) 5-1.5 MG/5ML syrup Take 5 mLs by mouth every 6 (six) hours as needed for cough. 120 mL 0   ibuprofen (ADVIL,MOTRIN) 200 MG tablet Take 200 mg by mouth every 6 (six) hours as needed.     Multiple Vitamin (MULTIVITAMIN) tablet Take 1 tablet by mouth daily.     Multiple Vitamins-Minerals (PRESERVISION AREDS PO) Take 2 capsules by mouth daily.     nitroGLYCERIN (NITROSTAT) 0.4 MG SL tablet Place 1 tablet (0.4 mg total) under the tongue every 5 (five) minutes as needed for chest pain. 25 tablet 3   Omega-3 Fatty Acids (FISH OIL CONCENTRATE PO) Take 1 capsule by mouth daily.      ondansetron (ZOFRAN) 8 MG tablet Take 1 tablet (8 mg total) by mouth every 8 (eight) hours as needed for nausea. 30 tablet 3   traMADol (ULTRAM) 50 MG tablet Take 50 mg by mouth every 6 (six) hours as needed.     vardenafil (LEVITRA) 10 MG tablet Take 10 mg by mouth as directed.     venetoclax 10 & 50 & 100 MG TBPK Week1: Take 20mg  once daily. HDQQ2: Take 50mg  once daily. WLNL8: Take 100mg  once daily. XQJJ9: Take 200mg  once daily. Take w food and water. 42 each 0   No current facility-administered medications for this visit.     REVIEW OF SYSTEMS:    A 10+ POINT REVIEW OF SYSTEMS WAS OBTAINED including neurology, dermatology, psychiatry, cardiac, respiratory, lymph, extremities, GI, GU, Musculoskeletal, constitutional, breasts, reproductive, HEENT.  All pertinent positives are noted in the HPI.  All others are negative.   PHYSICAL EXAMINATION: ECOG PERFORMANCE STATUS: 1 - Symptomatic but completely ambulatory  Vitals:   05/26/18 0914  BP: 101/65  Pulse: 76  Resp: 17  Temp: 97.6 F (36.4 C)  SpO2: 100%   Filed Weights   05/26/18 0914  Weight: 159 lb 8 oz  (72.3 kg)   .Body mass index is 20.48 kg/m.   GENERAL:alert, in no acute distress and comfortable SKIN: no acute rashes, no significant lesions EYES: conjunctiva are pink and non-injected, sclera anicteric OROPHARYNX: MMM, no exudates, no oropharyngeal erythema or ulceration NECK: supple, no JVD LYMPH: Palpable 2cm lymph nodes in b/l cervical and axilla. Palpable right inguinal LN 3-4cm. Palpable 1-2cm lymph nodes in left inguinal. LUNGS: clear to auscultation b/l with normal respiratory effort HEART: regular rate & rhythm ABDOMEN:  normoactive bowel sounds , non tender, not distended. Palpable splenomegaly 1cm under costal margin.  Extremity: no pedal edema PSYCH: alert & oriented x 3 with fluent speech NEURO: no focal motor/sensory deficits   LABORATORY DATA:  I have reviewed the data as listed  . CBC Latest Ref Rng & Units 05/26/2018 05/19/2018 05/15/2018  WBC 4.0 - 10.5 K/uL 11.6(H) 11.8(H) 8.8  Hemoglobin 13.0 - 17.0 g/dL 9.6(L) 8.5(L) 8.1(L)  Hematocrit 39.0 - 52.0 % 30.5(L) 27.4(L) 25.6(L)  Platelets 150 - 400 K/uL 104(L) 100(L) 82(L)    . CMP Latest Ref Rng & Units 05/26/2018 05/19/2018 05/15/2018  Glucose 70 - 99 mg/dL 124(H) 126(H) 179(H)  BUN 8 - 23 mg/dL 29(H) 29(H) 34(H)  Creatinine 0.61 - 1.24 mg/dL 0.92 1.07 1.05  Sodium 135 - 145 mmol/L 137 137 136  Potassium 3.5 - 5.1 mmol/L 4.0 4.3 4.2  Chloride 98 - 111 mmol/L 101 100 103  CO2 22 - 32 mmol/L 27 25 23   Calcium 8.9 -  10.3 mg/dL 8.6(L) 8.8(L) 8.1(L)  Total Protein 6.5 - 8.1 g/dL 6.3(L) 6.6 6.3(L)  Total Bilirubin 0.3 - 1.2 mg/dL 0.8 0.8 0.9  Alkaline Phos 38 - 126 U/L 65 76 75  AST 15 - 41 U/L 13(L) 32 35  ALT 0 - 44 U/L 42 96(H) 36    04/30/18 Outside Labs:                RADIOGRAPHIC STUDIES: I have personally reviewed the radiological images as listed and agreed with the findings in the report. No results found.  ASSESSMENT & PLAN:  68 y.o. male with  1. Chronic Lymphocytic  Leukemia Initial diagnosis on 02/13/15 Peripheral blood flow cytometry which revealed a Monoclonal B-cell population 09/25/15 Cytogenetics ruled out Mantle cell lymphoma. 12/25/17 FISH study ruled out translocation 11;14 12/30/17 BM Biopsy revealed CLL without evidence of transformation  03/17/18 FISH CLL Prognostic panel revealed an 11q deletion and Trisomy 12  03/19/18 CT N/C/A/P revealed NECK: Multiple enlarged bilateral cervical lymph nodes consistent with the given diagnosis of CLL. The largest node is a right level 3 lymph node measuring 3.4 x 2.8 x 1.2 cm. 2. Degenerative changes of the cervical spine. 3. Hypodense lesion in the right side of the tongue measuring up to 1.5 cm. This could represent focal infection. Neoplasm is considered less likely. The lesion appears to be superficial along the right lateral aspect of the anterior tongue a. This should be amenable to direct inspection, C/A/P: Marked adenopathy within the chest, abdomen, and pelvis, consistent with active lymphoma/leukemia. 2. Splenomegaly is nonspecific. Splenic involvement cannot be excluded. 3. Right greater than left and basilar predominant peribronchovascular interstitial thickening and nodularity. Considerations include pulmonary involvement of lymphoma and/or Infection/aspiration. 4. Coronary artery atherosclerosis. Aortic Atherosclerosis. 5. Trace nonspecific pelvic fluid.  03/26/18 Left cervical LN biopsy revealed SLL/CLL  PLAN: -Discussed pt labwork today, 05/26/18; HGB improved to 9.6, PLT holding at 104k, WBC stable with Lymphs at 11k. Albumin and liver functions improved. Other chemistries stable. Uric acid okay at 3.5. LDH normalized to 157. -The pt has no prohibitive toxicities from increasing to 100mg  Venetoclax at this time. Continue premeds of Tylenol and Dexamethasone as well. -Will look to hold Allopurinol in next 1-2 weeks; monitoring LFTs. -Will continue with slow weekly dose escalation of Venetoclax and will  consider adding Gazyva in the next 2 months -Pt is staying well hydrated, no indication for IVF at this time -Continue weekly labs -Recommended that the pt continue to eat well, drink at least 48-64 oz of water each day, and walk 20-30 minutes each day. -Advised crowd avoidance, infection prevention strategies, and frequent hand washing. -Will see the pt back in 1 week   -continue followup weekly x 3 as recently scheduled with labs   All of the patients questions were answered with apparent satisfaction. The patient knows to call the clinic with any problems, questions or concerns.  The total time spent in the appt was 25 minutes and more than 50% was on counseling and direct patient cares.    Sullivan Lone MD MS AAHIVMS [facility] Hematology/Oncology Physician [facility]  (Office):       [phone] (Work cell):  [phone] (Fax):           [phone]  05/26/2018 10:09 AM  I, Baldwin [place], am acting as a scribe for Dr. Sullivan Lone.   .I have reviewed the above documentation for accuracy and completeness, and I agree with the above. Brunetta Genera MD

## 2018-05-30 ENCOUNTER — Other Ambulatory Visit: Payer: Self-pay | Admitting: Hematology

## 2018-05-30 DIAGNOSIS — C9110 Chronic lymphocytic leukemia of B-cell type not having achieved remission: Secondary | ICD-10-CM

## 2018-06-01 ENCOUNTER — Other Ambulatory Visit: Payer: Self-pay | Admitting: Hematology

## 2018-06-01 DIAGNOSIS — C9110 Chronic lymphocytic leukemia of B-cell type not having achieved remission: Secondary | ICD-10-CM

## 2018-06-01 NOTE — Progress Notes (Signed)
HEMATOLOGY/ONCOLOGY CLINIC NOTE  Date of Service: 06/02/2018  Patient Care Team: [name], MD as PCP - General (Internal Medicine) Sherren Mocha, MD as PCP - Cardiology (Cardiology)  CHIEF COMPLAINTS/PURPOSE OF CONSULTATION:  Chronic Lymphocytic Leukemia  Oncologic History:     Chronic lymphocytic leukemia (CLL), B-cell ([place])   02/14/2015 Initial Diagnosis    Monoclonal B-cell population CD5 and CD20 positive, differential diagnosis CLL versus mantle cell lymphoma ( patient had absolute lymphocyte count of 8000 in 2010), remained stable.      HISTORY OF PRESENTING ILLNESS:  Jake Kennedy is a wonderful 68 y.o. male who has been referred to [place] by Dr. [name] for evaluation and management of Chronic Lymphocytic Leukemia. The pt was formerly under the care of my colleague Dr. Nicholas Lose. He is accompanied today by his wife. The pt reports that he is doing well overall.  The pt reports that his course of CLL had been pretty consistent since diagnosis in 2017, until roughly 3 months ago in roughly November 2019. He notes that he has had recent increase in fatigue in the last 3 months, and more acutely in the last 3 weeks. He endorses occasional fevers over the last two weeks during the night, highest at 100.2 or 100.3. He also endorses some night sweats, not every night, some of these being "drenching." He notes that he has been eating well for the last 6 months, however has lost 8 pounds in that interim. He endorses regular exercising until the past month, due to his worsened fatigue, and had previously regularly run upwards of 3 miles and bike for several miles as well.  The pt notes that he can't smell very well, in the last 2-3 weeks, and his hearing has also reduced in the last week. He senses that his enlarged lymph nodes are pressing on his neck, and characterizes this as similar to when one has an acute cold and congestion. He had acute pain in his right hip a week ago  and saw my colleague Sandi Mealy, PA in Symptom Managment. He was given Norco but denies taking this because he didn't want to. He endorses a positional element to his hip pain, which e attributes to the pain being from an enlarged lymph node. He notes that his hip pain had sudden onset on the night of 03/18/18. He notes that his neck lymph nodes have grown in the last 3 weeks to a month. The pt notes that his lymph nodes in his neck, armpits, and groin have been enlarged.  The pt tried an antibiotic course 2 weeks ago with his PCP for a suspected sinus infection, during which his cervical lymph nodes enlarged further. He notes that his neck lymph nodes are somewhat improved today as compared to this. The pt denies frequent infections.   Of note prior to the patient's visit today, pt has had CT N/C/A/P completed on 03/19/18 with results revealing NECK: Multiple enlarged bilateral cervical lymph nodes consistent with the given diagnosis of CLL. The largest node is a right level 3 lymph node measuring 3.4 x 2.8 x 1.2 cm. 2. Degenerative changes of the cervical spine. 3. Hypodense lesion in the right side of the tongue measuring up to 1.5 cm. This could represent focal infection. Neoplasm is considered less likely. The lesion appears to be superficial along the right lateral aspect of the anterior tongue a. This should be amenable to direct inspection, C/A/P: Marked adenopathy within the chest, abdomen, and pelvis, consistent  with active lymphoma/leukemia. 2. Splenomegaly is nonspecific. Splenic involvement cannot be excluded. 3. Right greater than left and basilar predominant peribronchovascular interstitial thickening and nodularity. Considerations include pulmonary involvement of lymphoma and/or Infection/aspiration. 4. Coronary artery atherosclerosis. Aortic Atherosclerosis. 5. Trace nonspecific pelvic fluid.  Most recent lab results (03/17/18) of CBC w/diff and CMP is as follows: all values are WNL except for  WBC at 13.1k, RBC at 2.67, HGB at 10.1, HCT at 31.0, MCV at 116.1, MCH at 37.8, PLT at 103k, ANC at 900, Lymphs abs at 11.1k, BUN at 31, Calcium at 8.7, Total Protein at 6.2, Albumin at 3.4. 03/17/18 LDH at 241  On review of systems, pt reports new fatigue, recent fevers, recent night sweats, recently grown neck lymph nodes, eating well, some weight loss, right groin pain, and denies chills, SOB, ear pain, pain along the spine, frequent infections, flank pain, leg swelling, testicular pain or swelling, and any other symptoms.  Interval History:   Jake Kennedy returns today for management and evaluation of his CLL. The patient's last visit with [place] was on 05/26/18. The pt reports that he is doing well overall.  The pt reports that he is enjoying much improved energy levels. He notes that he is now sleeping well and rising early, feeling rested. The pt notes that he has continued tolerating Venetoclax well, and took 100mg  daily last week. The pt denies fevers or concerns for infections. He notes that his nausea has resolved. He continues to have a "slight cough" in his throat which has continued to improve. Denies chest pain or SOB. He also notes that he has been able to return to walking each day, is not running again yet, but is steadily increasing his activity levels.  Lab results today (06/02/18) of CBC w/diff and CMP is as follows: all values are WNL except for RBC at 2.52, HGB at 10.2, HCT at 31.8, MCV at 126.2, MCH at 40.5, RDW at 18.4, PLT at 99k, ANC at 700, Glucose at 163, BUN at 27, Calcium at 8.3, AST at 14. 06/02/18 Uric acid at 3.2 06/02/18 Phosphorous at 3.3 06/02/18 LDH is . Lab Results  Component Value Date   LDH 144 06/02/2018   On review of systems, pt reports improved energy levels, sleeping well, increasing activity levels, eating well, desired weight gain, and denies concern for infections, chest pain, SOB, nausea, fevers, and any other symptoms.    MEDICAL HISTORY:  Past Medical  History:  Diagnosis Date   Basal cell carcinoma    moles removed in 2007 and 2012 from L nose and L hip   BPH (benign prostatic hypertrophy)    CAD (coronary artery disease), native coronary artery    a. 02/10/2015 95% mid LAD stenosis s/p DES  b. 07/15/16: LHC showed stable non obst dz and patent stent   CLL (chronic lymphocytic leukemia) ([place])    ED (erectile dysfunction)    Lymphocytosis 02/11/2015   seen by Dr. Lindi Adie 02/11/2015, working up for possible CLL    SURGICAL HISTORY: Past Surgical History:  Procedure Laterality Date   CARDIAC CATHETERIZATION  02/10/2015   CARDIAC CATHETERIZATION N/A 02/10/2015   Procedure: Left Heart Cath and Coronary Angiography;  Surgeon: Sherren Mocha, MD;  Location: [facility];  Service: Cardiovascular;  Laterality: N/A;   CARDIAC CATHETERIZATION N/A 02/10/2015   Procedure: Coronary Stent Intervention;  Surgeon: Sherren Mocha, MD;  Location: [facility];  Service: Cardiovascular;  Laterality: N/A;   CORONARY STENT PLACEMENT  02/10/2015  DES to LAD   LAMINECTOMY     LEFT HEART CATH AND CORONARY ANGIOGRAPHY N/A 07/15/2016   Procedure: Left Heart Cath and Coronary Angiography;  Surgeon: Martinique, Peter M, MD;  Location: [facility];  Service: Cardiovascular;  Laterality: N/A;   TONSILLECTOMY      SOCIAL HISTORY: Social History   Socioeconomic History   Marital status: Married    Spouse name: Not on file   Number of children: Not on file   Years of education: Not on file   Highest education level: Not on file  Occupational History   Not on file  Social Needs   Financial resource strain: Not on file   Food insecurity:    Worry: Not on file    Inability: Not on file   Transportation needs:    Medical: Not on file    Non-medical: Not on file  Tobacco Use   Smoking status: Never Smoker   Smokeless tobacco: Never Used  Substance and Sexual Activity   Alcohol use: Yes    Alcohol/week: 0.0 standard drinks     Comment: occasional   Drug use: No   Sexual activity: Not on file  Lifestyle   Physical activity:    Days per week: Not on file    Minutes per session: Not on file   Stress: Not on file  Relationships   Social connections:    Talks on phone: Not on file    Gets together: Not on file    Attends religious service: Not on file    Active member of club or organization: Not on file    Attends meetings of clubs or organizations: Not on file    Relationship status: Not on file   Intimate partner violence:    Fear of current or ex partner: Not on file    Emotionally abused: Not on file    Physically abused: Not on file    Forced sexual activity: Not on file  Other Topics Concern   Not on file  Social History Narrative   Not on file    FAMILY HISTORY: Family History  Problem Relation Age of Onset   Diabetes Mother    Heart failure Mother    Heart attack Mother        occured in 35s.    Healthy Sister    Healthy Brother    Healthy Maternal Grandmother    Healthy Maternal Grandfather    Healthy Paternal Grandmother    Healthy Paternal Grandfather    Healthy Brother     ALLERGIES:  is allergic to adhesive [tape] and latex.  MEDICATIONS:  Current Outpatient Medications  Medication Sig Dispense Refill   albuterol (PROVENTIL HFA;VENTOLIN HFA) 108 (90 Base) MCG/ACT inhaler Inhale 2 puffs into the lungs every 6 (six) hours as needed for wheezing or shortness of breath. 1 Inhaler 2   allopurinol (ZYLOPRIM) 100 MG tablet Take 1 tablet (100 mg total) by mouth 2 (two) times daily. 60 tablet 0   aspirin 81 MG tablet Take 81 mg by mouth daily.     atorvastatin (LIPITOR) 80 MG tablet Take 1 tablet (80 mg total) by mouth daily at 6 PM. 90 tablet 3   clotrimazole-betamethasone (LOTRISONE) cream APP EXT AA BID UNTIL RESOLVED     dexamethasone (DECADRON) 4 MG tablet Take one tablet daily 30-45 minutes prior to Venclexta with Zofran and acetaminophen. 30 tablet 0     HYDROcodone-homatropine (HYCODAN) 5-1.5 MG/5ML syrup Take 5 mLs by mouth every  6 (six) hours as needed for cough. 120 mL 0   ibuprofen (ADVIL,MOTRIN) 200 MG tablet Take 200 mg by mouth every 6 (six) hours as needed.     Multiple Vitamin (MULTIVITAMIN) tablet Take 1 tablet by mouth daily.     Multiple Vitamins-Minerals (PRESERVISION AREDS PO) Take 2 capsules by mouth daily.     nitroGLYCERIN (NITROSTAT) 0.4 MG SL tablet Place 1 tablet (0.4 mg total) under the tongue every 5 (five) minutes as needed for chest pain. 25 tablet 3   Omega-3 Fatty Acids (FISH OIL CONCENTRATE PO) Take 1 capsule by mouth daily.      ondansetron (ZOFRAN) 8 MG tablet Take 1 tablet (8 mg total) by mouth every 8 (eight) hours as needed for nausea. 30 tablet 3   traMADol (ULTRAM) 50 MG tablet Take 50 mg by mouth every 6 (six) hours as needed.     vardenafil (LEVITRA) 10 MG tablet Take 10 mg by mouth as directed.     venetoclax (VENCLEXTA) 100 MG TABS Take 400 mg by mouth daily. Take with a meal and a glass of water at approximately the same time daily. 120 tablet 1   Current Facility-Administered Medications  Medication Dose Route Frequency Provider Last Rate Last Dose   pegfilgrastim (NEULASTA) injection 6 mg  6 mg Subcutaneous Once Irene Limbo, [place]oria [place], MD        REVIEW OF SYSTEMS:    A 10+ POINT REVIEW OF SYSTEMS WAS OBTAINED including neurology, dermatology, psychiatry, cardiac, respiratory, lymph, extremities, GI, GU, Musculoskeletal, constitutional, breasts, reproductive, HEENT.  All pertinent positives are noted in the HPI.  All others are negative.   PHYSICAL EXAMINATION: ECOG PERFORMANCE STATUS: 1 - Symptomatic but completely ambulatory  Vitals:   06/02/18 1333  BP: (!) 123/59  Pulse: 80  Resp: 18  Temp: 97.9 F (36.6 C)  SpO2: 100%   Filed Weights   06/02/18 1333  Weight: 162 lb 3.2 oz (73.6 kg)   .Body mass index is 20.83 kg/m.   GENERAL:alert, in no acute distress and  comfortable SKIN: no acute rashes, no significant lesions EYES: conjunctiva are pink and non-injected, sclera anicteric OROPHARYNX: MMM, no exudates, no oropharyngeal erythema or ulceration NECK: supple, no JVD LYMPH:  no palpable lymphadenopathy in the cervical, axillary or inguinal regions LUNGS: clear to auscultation b/l with normal respiratory effort HEART: regular rate & rhythm ABDOMEN:  normoactive bowel sounds , non tender, not distended. No palpable hepatosplenomegaly.  Extremity: no pedal edema PSYCH: alert & oriented x 3 with fluent speech NEURO: no focal motor/sensory deficits   LABORATORY DATA:  I have reviewed the data as listed  . CBC Latest Ref Rng & Units 06/02/2018 05/26/2018 05/19/2018  WBC 4.0 - 10.5 K/uL 4.7 11.6(H) 11.8(H)  Hemoglobin 13.0 - 17.0 g/dL 10.2(L) 9.6(L) 8.5(L)  Hematocrit 39.0 - 52.0 % 31.8(L) 30.5(L) 27.4(L)  Platelets 150 - 400 K/uL 99(L) 104(L) 100(L)  ANC 700<---600<---1200  . CMP Latest Ref Rng & Units 06/02/2018 05/26/2018 05/19/2018  Glucose 70 - 99 mg/dL 163(H) 124(H) 126(H)  BUN 8 - 23 mg/dL 27(H) 29(H) 29(H)  Creatinine 0.61 - 1.24 mg/dL 0.88 0.92 1.07  Sodium 135 - 145 mmol/L 138 137 137  Potassium 3.5 - 5.1 mmol/L 4.9 4.0 4.3  Chloride 98 - 111 mmol/L 102 101 100  CO2 22 - 32 mmol/L 26 27 25   Calcium 8.9 - 10.3 mg/dL 8.3(L) 8.6(L) 8.8(L)  Total Protein 6.5 - 8.1 g/dL 6.5 6.3(L) 6.6  Total Bilirubin 0.3 - 1.2 mg/dL  0.7 0.8 0.8  Alkaline Phos 38 - 126 U/L 64 65 76  AST 15 - 41 U/L 14(L) 13(L) 32  ALT 0 - 44 U/L 29 42 96(H)    04/30/18 Outside Labs:                RADIOGRAPHIC STUDIES: I have personally reviewed the radiological images as listed and agreed with the findings in the report. No results found.  ASSESSMENT & PLAN:  68 y.o. male with  1. Chronic Lymphocytic Leukemia Initial diagnosis on 02/13/15 Peripheral blood flow cytometry which revealed a Monoclonal B-cell population 09/25/15 Cytogenetics ruled out  Mantle cell lymphoma. 12/25/17 FISH study ruled out translocation 11;14 12/30/17 BM Biopsy revealed CLL without evidence of transformation  03/17/18 FISH CLL Prognostic panel revealed an 11q deletion and Trisomy 12  03/19/18 CT N/C/A/P revealed NECK: Multiple enlarged bilateral cervical lymph nodes consistent with the given diagnosis of CLL. The largest node is a right level 3 lymph node measuring 3.4 x 2.8 x 1.2 cm. 2. Degenerative changes of the cervical spine. 3. Hypodense lesion in the right side of the tongue measuring up to 1.5 cm. This could represent focal infection. Neoplasm is considered less likely. The lesion appears to be superficial along the right lateral aspect of the anterior tongue a. This should be amenable to direct inspection, C/A/P: Marked adenopathy within the chest, abdomen, and pelvis, consistent with active lymphoma/leukemia. 2. Splenomegaly is nonspecific. Splenic involvement cannot be excluded. 3. Right greater than left and basilar predominant peribronchovascular interstitial thickening and nodularity. Considerations include pulmonary involvement of lymphoma and/or Infection/aspiration. 4. Coronary artery atherosclerosis. Aortic Atherosclerosis. 5. Trace nonspecific pelvic fluid.  03/26/18 Left cervical LN biopsy revealed SLL/CLL  PLAN: -Discussed pt labwork today, 06/02/18; HGB improved to 10.2, Lymphocytes have normalized to 3.2k. ANC at 700. Uric acid at 3.3 -Discussed that from a CLL standpoint, the patient has responded well to treatment -Discussed that the patient's neutropenia did improve from 600 to 700 after last week's dose escalation. Recommend continuing 100mg  Venetoclax this week, instead of increasing to 200mg  today.  -Will also order Neulasta today to keep further dose escalations on track -Pt has had neutropenia prior to treatment, and now his neutropenia is a balancing act between suppression from CLL and medication effect of Venetoclax -Continue premeds of  Tylenol and Dexamethasone as well. -Will look to hold Allopurinol in next 1-2 weeks; monitoring LFTs. -Will continue with slow dose escalation of Venetoclax and will consider adding Gazyva in the next 2 months -Pt is staying well hydrated, no indication for IVF at this time -Continue weekly labs -Recommended that the pt continue to eat well, drink at least 48-64 oz of water each day, and walk 20-30 minutes each day.   -Advised crowd avoidance, infection prevention strategies, and frequent hand washing. -Will see the pt back in 1 week   neulasta today Labs weekly x 4 RTC with Dr Irene Limbo in 2 weeks   All of the patients questions were answered with apparent satisfaction. The patient knows to call the clinic with any problems, questions or concerns.  The total time spent in the appt was 25 minutes and more than 50% was on counseling and direct patient cares.    Sullivan Lone MD [place] AAHIVMS [facility] Hematology/Oncology Physician [facility]  (Office):       [phone] (Work cell):  [phone] (Fax):           [phone]  06/02/2018 2:22 PM  I, Baldwin Jamaica,  am acting as a scribe for Dr. Sullivan Lone.   .I have reviewed the above documentation for accuracy and completeness, and I agree with the above. Brunetta Genera MD

## 2018-06-02 ENCOUNTER — Other Ambulatory Visit: Payer: Self-pay | Admitting: Pharmacist

## 2018-06-02 ENCOUNTER — Inpatient Hospital Stay: Payer: BLUE CROSS/BLUE SHIELD

## 2018-06-02 ENCOUNTER — Other Ambulatory Visit: Payer: Self-pay

## 2018-06-02 ENCOUNTER — Inpatient Hospital Stay (HOSPITAL_BASED_OUTPATIENT_CLINIC_OR_DEPARTMENT_OTHER): Payer: BLUE CROSS/BLUE SHIELD | Admitting: Hematology

## 2018-06-02 VITALS — BP 123/59 | HR 80 | Temp 97.9°F | Resp 18 | Ht 74.0 in | Wt 162.2 lb

## 2018-06-02 DIAGNOSIS — K5900 Constipation, unspecified: Secondary | ICD-10-CM | POA: Diagnosis not present

## 2018-06-02 DIAGNOSIS — Z79899 Other long term (current) drug therapy: Secondary | ICD-10-CM | POA: Diagnosis not present

## 2018-06-02 DIAGNOSIS — M47812 Spondylosis without myelopathy or radiculopathy, cervical region: Secondary | ICD-10-CM | POA: Diagnosis not present

## 2018-06-02 DIAGNOSIS — R161 Splenomegaly, not elsewhere classified: Secondary | ICD-10-CM | POA: Diagnosis not present

## 2018-06-02 DIAGNOSIS — Z791 Long term (current) use of non-steroidal anti-inflammatories (NSAID): Secondary | ICD-10-CM | POA: Diagnosis not present

## 2018-06-02 DIAGNOSIS — C9110 Chronic lymphocytic leukemia of B-cell type not having achieved remission: Secondary | ICD-10-CM | POA: Diagnosis not present

## 2018-06-02 DIAGNOSIS — I700 Atherosclerosis of aorta: Secondary | ICD-10-CM | POA: Diagnosis not present

## 2018-06-02 DIAGNOSIS — Z7689 Persons encountering health services in other specified circumstances: Secondary | ICD-10-CM | POA: Diagnosis not present

## 2018-06-02 DIAGNOSIS — Z7982 Long term (current) use of aspirin: Secondary | ICD-10-CM | POA: Diagnosis not present

## 2018-06-02 DIAGNOSIS — D702 Other drug-induced agranulocytosis: Secondary | ICD-10-CM | POA: Diagnosis not present

## 2018-06-02 DIAGNOSIS — Z9189 Other specified personal risk factors, not elsewhere classified: Secondary | ICD-10-CM

## 2018-06-02 DIAGNOSIS — Z85828 Personal history of other malignant neoplasm of skin: Secondary | ICD-10-CM | POA: Diagnosis not present

## 2018-06-02 DIAGNOSIS — N400 Enlarged prostate without lower urinary tract symptoms: Secondary | ICD-10-CM | POA: Diagnosis not present

## 2018-06-02 DIAGNOSIS — I2510 Atherosclerotic heart disease of native coronary artery without angina pectoris: Secondary | ICD-10-CM | POA: Diagnosis not present

## 2018-06-02 LAB — CBC WITH DIFFERENTIAL/PLATELET
Abs Immature Granulocytes: 0.01 10*3/uL (ref 0.00–0.07)
Basophils Absolute: 0 10*3/uL (ref 0.0–0.1)
Basophils Relative: 0 %
Eosinophils Absolute: 0 10*3/uL (ref 0.0–0.5)
Eosinophils Relative: 0 %
HCT: 31.8 % — ABNORMAL LOW (ref 39.0–52.0)
Hemoglobin: 10.2 g/dL — ABNORMAL LOW (ref 13.0–17.0)
Immature Granulocytes: 0 %
Lymphocytes Relative: 67 %
Lymphs Abs: 3.2 10*3/uL (ref 0.7–4.0)
MCH: 40.5 pg — ABNORMAL HIGH (ref 26.0–34.0)
MCHC: 32.1 g/dL (ref 30.0–36.0)
MCV: 126.2 fL — ABNORMAL HIGH (ref 80.0–100.0)
Monocytes Absolute: 0.8 10*3/uL (ref 0.1–1.0)
Monocytes Relative: 18 %
Neutro Abs: 0.7 10*3/uL — ABNORMAL LOW (ref 1.7–7.7)
Neutrophils Relative %: 15 %
Platelets: 99 10*3/uL — ABNORMAL LOW (ref 150–400)
RBC: 2.52 MIL/uL — ABNORMAL LOW (ref 4.22–5.81)
RDW: 18.4 % — ABNORMAL HIGH (ref 11.5–15.5)
WBC: 4.7 10*3/uL (ref 4.0–10.5)
nRBC: 0 % (ref 0.0–0.2)

## 2018-06-02 LAB — CMP (CANCER CENTER ONLY)
ALT: 29 U/L (ref 0–44)
AST: 14 U/L — ABNORMAL LOW (ref 15–41)
Albumin: 3.6 g/dL (ref 3.5–5.0)
Alkaline Phosphatase: 64 U/L (ref 38–126)
Anion gap: 10 (ref 5–15)
BUN: 27 mg/dL — ABNORMAL HIGH (ref 8–23)
CO2: 26 mmol/L (ref 22–32)
Calcium: 8.3 mg/dL — ABNORMAL LOW (ref 8.9–10.3)
Chloride: 102 mmol/L (ref 98–111)
Creatinine: 0.88 mg/dL (ref 0.61–1.24)
GFR, Est AFR Am: >60 mL/min (ref >60)
GFR, Estimated: >60 mL/min (ref >60)
Glucose, Bld: 163 mg/dL — ABNORMAL HIGH (ref 70–99)
Potassium: 4.9 mmol/L (ref 3.5–5.1)
Sodium: 138 mmol/L (ref 135–145)
Total Bilirubin: 0.7 mg/dL (ref 0.3–1.2)
Total Protein: 6.5 g/dL (ref 6.5–8.1)

## 2018-06-02 LAB — PHOSPHORUS: Phosphorus: 3.3 mg/dL (ref 2.5–4.6)

## 2018-06-02 LAB — LACTATE DEHYDROGENASE: LDH: 144 U/L (ref 98–192)

## 2018-06-02 LAB — URIC ACID: Uric Acid, Serum: 3.2 mg/dL — ABNORMAL LOW (ref 3.7–8.6)

## 2018-06-02 MED ORDER — PEGFILGRASTIM INJECTION 6 MG/0.6ML ~~LOC~~: 6.0000 mg | PREFILLED_SYRINGE | Freq: Once | SUBCUTANEOUS | Status: DC

## 2018-06-02 MED ORDER — VENETOCLAX 100 MG PO TABS: 400.0000 mg | ORAL_TABLET | Freq: Every day | ORAL | 1 refills | Status: DC

## 2018-06-02 NOTE — Telephone Encounter (Signed)
Oral Oncology Pharmacist Encounter  Patient will finish Venclexta starter pack this week. New prescription for Venclexta 100mg  tablets, take 4 tablets (400mg ) by mouth once daily with a meal and a glass of water, at approximately the same time each day, quantity #120, refills=1, has been e-scribed to the [employer]. This is shipping to the patient's mailing address tomorrow for delivery on 4/30.  Johny Drilling, PharmD, BCPS, BCOP  06/02/2018 12:21 PM Oral Oncology Clinic [phone]

## 2018-06-03 ENCOUNTER — Other Ambulatory Visit: Payer: Self-pay | Admitting: Hematology

## 2018-06-03 ENCOUNTER — Other Ambulatory Visit: Payer: Self-pay

## 2018-06-03 ENCOUNTER — Telehealth: Payer: Self-pay | Admitting: *Deleted

## 2018-06-03 ENCOUNTER — Telehealth: Payer: Self-pay | Admitting: Hematology

## 2018-06-03 ENCOUNTER — Inpatient Hospital Stay: Payer: BLUE CROSS/BLUE SHIELD

## 2018-06-03 VITALS — BP 115/70 | HR 76 | Temp 98.3°F | Resp 18

## 2018-06-03 DIAGNOSIS — I700 Atherosclerosis of aorta: Secondary | ICD-10-CM | POA: Diagnosis not present

## 2018-06-03 DIAGNOSIS — Z791 Long term (current) use of non-steroidal anti-inflammatories (NSAID): Secondary | ICD-10-CM | POA: Diagnosis not present

## 2018-06-03 DIAGNOSIS — Z7982 Long term (current) use of aspirin: Secondary | ICD-10-CM | POA: Diagnosis not present

## 2018-06-03 DIAGNOSIS — Z7689 Persons encountering health services in other specified circumstances: Secondary | ICD-10-CM | POA: Diagnosis not present

## 2018-06-03 DIAGNOSIS — K5900 Constipation, unspecified: Secondary | ICD-10-CM | POA: Diagnosis not present

## 2018-06-03 DIAGNOSIS — N400 Enlarged prostate without lower urinary tract symptoms: Secondary | ICD-10-CM | POA: Diagnosis not present

## 2018-06-03 DIAGNOSIS — C9110 Chronic lymphocytic leukemia of B-cell type not having achieved remission: Secondary | ICD-10-CM

## 2018-06-03 DIAGNOSIS — Z85828 Personal history of other malignant neoplasm of skin: Secondary | ICD-10-CM | POA: Diagnosis not present

## 2018-06-03 DIAGNOSIS — D702 Other drug-induced agranulocytosis: Secondary | ICD-10-CM

## 2018-06-03 DIAGNOSIS — Z79899 Other long term (current) drug therapy: Secondary | ICD-10-CM | POA: Diagnosis not present

## 2018-06-03 DIAGNOSIS — R161 Splenomegaly, not elsewhere classified: Secondary | ICD-10-CM | POA: Diagnosis not present

## 2018-06-03 DIAGNOSIS — I2510 Atherosclerotic heart disease of native coronary artery without angina pectoris: Secondary | ICD-10-CM | POA: Diagnosis not present

## 2018-06-03 DIAGNOSIS — M47812 Spondylosis without myelopathy or radiculopathy, cervical region: Secondary | ICD-10-CM | POA: Diagnosis not present

## 2018-06-03 MED ORDER — PEGFILGRASTIM INJECTION 6 MG/0.6ML ~~LOC~~
PREFILLED_SYRINGE | SUBCUTANEOUS | Status: AC
  Filled 2018-06-03: qty 0.6

## 2018-06-03 MED ORDER — PEGFILGRASTIM INJECTION 6 MG/0.6ML ~~LOC~~
6.0000 mg | PREFILLED_SYRINGE | Freq: Once | SUBCUTANEOUS | Status: AC
  Administered 2018-06-03: 6 mg via SUBCUTANEOUS

## 2018-06-03 MED FILL — VENCLEXTA 100 MG TABS: 100 | 30 days supply | Qty: 120 | Fill #0

## 2018-06-03 NOTE — Telephone Encounter (Signed)
Dr. Irene Limbo asked if patient can be scheduled for Neulasta injection today or tomorrow. Patient states he can come today, infusion states they can work him in. Schedule message sent.

## 2018-06-03 NOTE — Patient Instructions (Signed)
Pegfilgrastim injection  What is this medicine?  PEGFILGRASTIM (PEG fil gra stim) is a long-acting granulocyte colony-stimulating factor that stimulates the growth of neutrophils, a type of white blood cell important in the body's fight against infection. It is used to reduce the incidence of fever and infection in patients with certain types of cancer who are receiving chemotherapy that affects the bone marrow, and to increase survival after being exposed to high doses of radiation.  This medicine may be used for other purposes; ask your health care provider or pharmacist if you have questions.  COMMON BRAND NAME(S): Fulphila, Neulasta, UDENYCA  What should I tell my health care provider before I take this medicine?  They need to know if you have any of these conditions:  -kidney disease  -latex allergy  -ongoing radiation therapy  -sickle cell disease  -skin reactions to acrylic adhesives (On-Body Injector only)  -an unusual or allergic reaction to pegfilgrastim, filgrastim, other medicines, foods, dyes, or preservatives  -pregnant or trying to get pregnant  -breast-feeding  How should I use this medicine?  This medicine is for injection under the skin. If you get this medicine at home, you will be taught how to prepare and give the pre-filled syringe or how to use the On-body Injector. Refer to the patient Instructions for Use for detailed instructions. Use exactly as directed. Tell your healthcare provider immediately if you suspect that the On-body Injector may not have performed as intended or if you suspect the use of the On-body Injector resulted in a missed or partial dose.  It is important that you put your used needles and syringes in a special sharps container. Do not put them in a trash can. If you do not have a sharps container, call your pharmacist or healthcare provider to get one.  Talk to your pediatrician regarding the use of this medicine in children. While this drug may be prescribed for  selected conditions, precautions do apply.  Overdosage: If you think you have taken too much of this medicine contact a poison control center or emergency room at once.  NOTE: This medicine is only for you. Do not share this medicine with others.  What if I miss a dose?  It is important not to miss your dose. Call your doctor or health care professional if you miss your dose. If you miss a dose due to an On-body Injector failure or leakage, a new dose should be administered as soon as possible using a single prefilled syringe for manual use.  What may interact with this medicine?  Interactions have not been studied.  Give your health care provider a list of all the medicines, herbs, non-prescription drugs, or dietary supplements you use. Also tell them if you smoke, drink alcohol, or use illegal drugs. Some items may interact with your medicine.  This list may not describe all possible interactions. Give your health care provider a list of all the medicines, herbs, non-prescription drugs, or dietary supplements you use. Also tell them if you smoke, drink alcohol, or use illegal drugs. Some items may interact with your medicine.  What should I watch for while using this medicine?  You may need blood work done while you are taking this medicine.  If you are going to need a MRI, CT scan, or other procedure, tell your doctor that you are using this medicine (On-Body Injector only).  What side effects may I notice from receiving this medicine?  Side effects that you should report to   your doctor or health care professional as soon as possible:  -allergic reactions like skin rash, itching or hives, swelling of the face, lips, or tongue  -back pain  -dizziness  -fever  -pain, redness, or irritation at site where injected  -pinpoint red spots on the skin  -red or dark-brown urine  -shortness of breath or breathing problems  -stomach or side pain, or pain at the shoulder  -swelling  -tiredness  -trouble passing urine or  change in the amount of urine  Side effects that usually do not require medical attention (report to your doctor or health care professional if they continue or are bothersome):  -bone pain  -muscle pain  This list may not describe all possible side effects. Call your doctor for medical advice about side effects. You may report side effects to FDA at 1-800-FDA-1088.  Where should I keep my medicine?  Keep out of the reach of children.  If you are using this medicine at home, you will be instructed on how to store it. Throw away any unused medicine after the expiration date on the label.  NOTE: This sheet is a summary. It may not cover all possible information. If you have questions about this medicine, talk to your doctor, pharmacist, or health care provider.   2019 Elsevier/Gold Standard (2017-04-28 16:57:08)

## 2018-06-03 NOTE — Telephone Encounter (Signed)
Scheduled appt per 4/28 los. °

## 2018-06-04 ENCOUNTER — Other Ambulatory Visit: Payer: Self-pay | Admitting: Hematology

## 2018-06-04 DIAGNOSIS — D702 Other drug-induced agranulocytosis: Secondary | ICD-10-CM

## 2018-06-04 DIAGNOSIS — Z9189 Other specified personal risk factors, not elsewhere classified: Secondary | ICD-10-CM

## 2018-06-04 DIAGNOSIS — C9110 Chronic lymphocytic leukemia of B-cell type not having achieved remission: Secondary | ICD-10-CM

## 2018-06-08 ENCOUNTER — Encounter: Payer: Self-pay | Admitting: Hematology

## 2018-06-09 ENCOUNTER — Ambulatory Visit: Payer: BLUE CROSS/BLUE SHIELD | Admitting: Hematology

## 2018-06-09 ENCOUNTER — Other Ambulatory Visit: Payer: Self-pay

## 2018-06-09 ENCOUNTER — Inpatient Hospital Stay: Payer: BC Managed Care – PPO | Attending: Hematology and Oncology

## 2018-06-09 DIAGNOSIS — Z85828 Personal history of other malignant neoplasm of skin: Secondary | ICD-10-CM | POA: Diagnosis not present

## 2018-06-09 DIAGNOSIS — I2510 Atherosclerotic heart disease of native coronary artery without angina pectoris: Secondary | ICD-10-CM | POA: Insufficient documentation

## 2018-06-09 DIAGNOSIS — R161 Splenomegaly, not elsewhere classified: Secondary | ICD-10-CM | POA: Insufficient documentation

## 2018-06-09 DIAGNOSIS — I700 Atherosclerosis of aorta: Secondary | ICD-10-CM | POA: Insufficient documentation

## 2018-06-09 DIAGNOSIS — Z79899 Other long term (current) drug therapy: Secondary | ICD-10-CM | POA: Diagnosis not present

## 2018-06-09 DIAGNOSIS — C9110 Chronic lymphocytic leukemia of B-cell type not having achieved remission: Secondary | ICD-10-CM

## 2018-06-09 DIAGNOSIS — Z9189 Other specified personal risk factors, not elsewhere classified: Secondary | ICD-10-CM

## 2018-06-09 DIAGNOSIS — K149 Disease of tongue, unspecified: Secondary | ICD-10-CM | POA: Diagnosis not present

## 2018-06-09 DIAGNOSIS — N400 Enlarged prostate without lower urinary tract symptoms: Secondary | ICD-10-CM | POA: Diagnosis not present

## 2018-06-09 DIAGNOSIS — N529 Male erectile dysfunction, unspecified: Secondary | ICD-10-CM | POA: Insufficient documentation

## 2018-06-09 DIAGNOSIS — D702 Other drug-induced agranulocytosis: Secondary | ICD-10-CM

## 2018-06-09 LAB — CMP (CANCER CENTER ONLY)
ALT: 25 U/L (ref 0–44)
AST: 9 U/L — ABNORMAL LOW (ref 15–41)
Albumin: 3.6 g/dL (ref 3.5–5.0)
Alkaline Phosphatase: 50 U/L (ref 38–126)
Anion gap: 10 (ref 5–15)
BUN: 30 mg/dL — ABNORMAL HIGH (ref 8–23)
CO2: 27 mmol/L (ref 22–32)
Calcium: 8.5 mg/dL — ABNORMAL LOW (ref 8.9–10.3)
Chloride: 102 mmol/L (ref 98–111)
Creatinine: 0.86 mg/dL (ref 0.61–1.24)
GFR, Est AFR Am: >60 mL/min (ref >60)
GFR, Estimated: >60 mL/min (ref >60)
Glucose, Bld: 118 mg/dL — ABNORMAL HIGH (ref 70–99)
Potassium: 3.7 mmol/L (ref 3.5–5.1)
Sodium: 139 mmol/L (ref 135–145)
Total Bilirubin: 1.8 mg/dL — ABNORMAL HIGH (ref 0.3–1.2)
Total Protein: 6.1 g/dL — ABNORMAL LOW (ref 6.5–8.1)

## 2018-06-09 LAB — CBC WITH DIFFERENTIAL/PLATELET
Abs Immature Granulocytes: 0 10*3/uL (ref 0.00–0.07)
Basophils Absolute: 0 10*3/uL (ref 0.0–0.1)
Basophils Relative: 0 %
Eosinophils Absolute: 0 10*3/uL (ref 0.0–0.5)
Eosinophils Relative: 0 %
HCT: 32.4 % — ABNORMAL LOW (ref 39.0–52.0)
Hemoglobin: 10.4 g/dL — ABNORMAL LOW (ref 13.0–17.0)
Lymphocytes Relative: 87 %
Lymphs Abs: 6.2 10*3/uL — ABNORMAL HIGH (ref 0.7–4.0)
MCH: 40.8 pg — ABNORMAL HIGH (ref 26.0–34.0)
MCHC: 32.1 g/dL (ref 30.0–36.0)
MCV: 127.1 fL — ABNORMAL HIGH (ref 80.0–100.0)
Monocytes Absolute: 0.1 10*3/uL (ref 0.1–1.0)
Monocytes Relative: 2 %
Neutro Abs: 0.8 10*3/uL — ABNORMAL LOW (ref 1.7–17.7)
Neutrophils Relative %: 11 %
Platelets: 47 10*3/uL — ABNORMAL LOW (ref 150–400)
RBC: 2.55 MIL/uL — ABNORMAL LOW (ref 4.22–5.81)
RDW: 17.2 % — ABNORMAL HIGH (ref 11.5–15.5)
WBC: 7.1 10*3/uL (ref 4.0–10.5)
nRBC: 0 % (ref 0.0–0.2)

## 2018-06-09 LAB — URIC ACID: Uric Acid, Serum: 3.4 mg/dL — ABNORMAL LOW (ref 3.7–8.6)

## 2018-06-09 LAB — PHOSPHORUS: Phosphorus: 3.8 mg/dL (ref 2.5–4.6)

## 2018-06-09 LAB — LACTATE DEHYDROGENASE: LDH: 118 U/L (ref 98–192)

## 2018-06-15 NOTE — Progress Notes (Signed)
HEMATOLOGY/ONCOLOGY CLINIC NOTE  Date of Service: 06/16/2018  Patient Care Team: [name], MD as PCP - General (Internal Medicine) Sherren Mocha, MD as PCP - Cardiology (Cardiology)  CHIEF COMPLAINTS/PURPOSE OF CONSULTATION:  Chronic Lymphocytic Leukemia  Oncologic History:     Chronic lymphocytic leukemia (CLL), B-cell ([place])   02/14/2015 Initial Diagnosis    Monoclonal B-cell population CD5 and CD20 positive, differential diagnosis CLL versus mantle cell lymphoma ( patient had absolute lymphocyte count of 8000 in 2010), remained stable.      HISTORY OF PRESENTING ILLNESS:  Jake Kennedy is a wonderful 68 y.o. male who has been referred to [place] by Dr. [name] for evaluation and management of Chronic Lymphocytic Leukemia. The pt was formerly under the care of my colleague Dr. Nicholas Lose. He is accompanied today by his wife. The pt reports that he is doing well overall.  The pt reports that his course of CLL had been pretty consistent since diagnosis in 2017, until roughly 3 months ago in roughly November 2019. He notes that he has had recent increase in fatigue in the last 3 months, and more acutely in the last 3 weeks. He endorses occasional fevers over the last two weeks during the night, highest at 100.2 or 100.3. He also endorses some night sweats, not every night, some of these being "drenching." He notes that he has been eating well for the last 6 months, however has lost 8 pounds in that interim. He endorses regular exercising until the past month, due to his worsened fatigue, and had previously regularly run upwards of 3 miles and bike for several miles as well.  The pt notes that he can't smell very well, in the last 2-3 weeks, and his hearing has also reduced in the last week. He senses that his enlarged lymph nodes are pressing on his neck, and characterizes this as similar to when one has an acute cold and congestion. He had acute pain in his right hip a week ago  and saw my colleague Sandi Mealy, PA in Symptom Managment. He was given Norco but denies taking this because he didn't want to. He endorses a positional element to his hip pain, which e attributes to the pain being from an enlarged lymph node. He notes that his hip pain had sudden onset on the night of 03/18/18. He notes that his neck lymph nodes have grown in the last 3 weeks to a month. The pt notes that his lymph nodes in his neck, armpits, and groin have been enlarged.  The pt tried an antibiotic course 2 weeks ago with his PCP for a suspected sinus infection, during which his cervical lymph nodes enlarged further. He notes that his neck lymph nodes are somewhat improved today as compared to this. The pt denies frequent infections.   Of note prior to the patient's visit today, pt has had CT N/C/A/P completed on 03/19/18 with results revealing NECK: Multiple enlarged bilateral cervical lymph nodes consistent with the given diagnosis of CLL. The largest node is a right level 3 lymph node measuring 3.4 x 2.8 x 1.2 cm. 2. Degenerative changes of the cervical spine. 3. Hypodense lesion in the right side of the tongue measuring up to 1.5 cm. This could represent focal infection. Neoplasm is considered less likely. The lesion appears to be superficial along the right lateral aspect of the anterior tongue a. This should be amenable to direct inspection, C/A/P: Marked adenopathy within the chest, abdomen, and pelvis, consistent  with active lymphoma/leukemia. 2. Splenomegaly is nonspecific. Splenic involvement cannot be excluded. 3. Right greater than left and basilar predominant peribronchovascular interstitial thickening and nodularity. Considerations include pulmonary involvement of lymphoma and/or Infection/aspiration. 4. Coronary artery atherosclerosis. Aortic Atherosclerosis. 5. Trace nonspecific pelvic fluid.  Most recent lab results (03/17/18) of CBC w/diff and CMP is as follows: all values are WNL except for  WBC at 13.1k, RBC at 2.67, HGB at 10.1, HCT at 31.0, MCV at 116.1, MCH at 37.8, PLT at 103k, ANC at 900, Lymphs abs at 11.1k, BUN at 31, Calcium at 8.7, Total Protein at 6.2, Albumin at 3.4. 03/17/18 LDH at 241  On review of systems, pt reports new fatigue, recent fevers, recent night sweats, recently grown neck lymph nodes, eating well, some weight loss, right groin pain, and denies chills, SOB, ear pain, pain along the spine, frequent infections, flank pain, leg swelling, testicular pain or swelling, and any other symptoms.  Interval History:   Jake Kennedy returns today for management and evaluation of his CLL. The patient's last visit with [place] was on 06/02/18. The pt reports that he is doing well overall.  The pt reports that he has been eating very well and his weight has been stable over the last week. He notes that he is now sleeping 5-6 hours and waking feeling rested, as opposed to prior to treatment in which he was sleeping up to 14 hours without feeling rested. Overall, he endorses much improved energy levels. His lymph nodes have normalized and he can no longer feel these.  Lab results today (06/16/18) of CBC w/diff and CMP is as follows: all values are WNL except for RBC at 2.66, HGB at 10.9, HCT at 34.5, MCV at 129.7, MCH at 41.0, RDW at 16.9, PLT at 66k, ANC at 1.2k, Lymphs abs at 5.5k, Monocytes at 1.1k, Glucose at 151, BUN at 30, Calcium at 8.6, Total Protein at 6.3. 06/16/18 Uric acid at 3.4 06/16/18 Phosphorous is pending 06/16/18 LDH is pending  On review of systems, pt reports sleeping well, improved energy levels, eating well, stable weight, and denies fevers, chills, concerns for infections, new lumps or bumps, abdominal pains, leg swelling, back pains, and any other symptoms.   MEDICAL HISTORY:  Past Medical History:  Diagnosis Date  . Basal cell carcinoma    moles removed in 2007 and 2012 from L nose and L hip  . BPH (benign prostatic hypertrophy)   . CAD (coronary artery  disease), native coronary artery    a. 02/10/2015 95% mid LAD stenosis s/p DES  b. 07/15/16: LHC showed stable non obst dz and patent stent  . CLL (chronic lymphocytic leukemia) ([place])   . ED (erectile dysfunction)   . Lymphocytosis 02/11/2015   seen by Dr. Lindi Adie 02/11/2015, working up for possible CLL    SURGICAL HISTORY: Past Surgical History:  Procedure Laterality Date  . CARDIAC CATHETERIZATION  02/10/2015  . CARDIAC CATHETERIZATION N/A 02/10/2015   Procedure: Left Heart Cath and Coronary Angiography;  Surgeon: Sherren Mocha, MD;  Location: [facility];  Service: Cardiovascular;  Laterality: N/A;  . CARDIAC CATHETERIZATION N/A 02/10/2015   Procedure: Coronary Stent Intervention;  Surgeon: Sherren Mocha, MD;  Location: [facility];  Service: Cardiovascular;  Laterality: N/A;  . CORONARY STENT PLACEMENT  02/10/2015   DES to LAD  . LAMINECTOMY    . LEFT HEART CATH AND CORONARY ANGIOGRAPHY N/A 07/15/2016   Procedure: Left Heart Cath and Coronary Angiography;  Surgeon: Martinique, Peter  M, MD;  Location: [facility];  Service: Cardiovascular;  Laterality: N/A;  . TONSILLECTOMY      SOCIAL HISTORY: Social History   Socioeconomic History  . Marital status: Married    Spouse name: Not on file  . Number of children: Not on file  . Years of education: Not on file  . Highest education level: Not on file  Occupational History  . Not on file  Social Needs  . Financial resource strain: Not on file  . Food insecurity:    Worry: Not on file    Inability: Not on file  . Transportation needs:    Medical: Not on file    Non-medical: Not on file  Tobacco Use  . Smoking status: Never Smoker  . Smokeless tobacco: Never Used  Substance and Sexual Activity  . Alcohol use: Yes    Alcohol/week: 0.0 standard drinks    Comment: occasional  . Drug use: No  . Sexual activity: Not on file  Lifestyle  . Physical activity:    Days per week: Not on file    Minutes per session: Not on  file  . Stress: Not on file  Relationships  . Social connections:    Talks on phone: Not on file    Gets together: Not on file    Attends religious service: Not on file    Active member of club or organization: Not on file    Attends meetings of clubs or organizations: Not on file    Relationship status: Not on file  . Intimate partner violence:    Fear of current or ex partner: Not on file    Emotionally abused: Not on file    Physically abused: Not on file    Forced sexual activity: Not on file  Other Topics Concern  . Not on file  Social History Narrative  . Not on file    FAMILY HISTORY: Family History  Problem Relation Age of Onset  . Diabetes Mother   . Heart failure Mother   . Heart attack Mother        occured in 79s.   . Healthy Sister   . Healthy Brother   . Healthy Maternal Grandmother   . Healthy Maternal Grandfather   . Healthy Paternal Grandmother   . Healthy Paternal Grandfather   . Healthy Brother     ALLERGIES:  is allergic to adhesive [tape] and latex.  MEDICATIONS:  Current Outpatient Medications  Medication Sig Dispense Refill  . aspirin 81 MG tablet Take 81 mg by mouth daily.    Marland Kitchen atorvastatin (LIPITOR) 80 MG tablet Take 1 tablet (80 mg total) by mouth daily at 6 PM. 90 tablet 3  . dexamethasone (DECADRON) 4 MG tablet TAKE 1 TABLET BY MOUTH DAILY 30 TO 45 MINUTES PRIOR TO VENCLEXTA WITH ZOFRAN AND ACETAMINOPHEN 30 tablet 2  . Multiple Vitamin (MULTIVITAMIN) tablet Take 1 tablet by mouth daily.    . Multiple Vitamins-Minerals (PRESERVISION AREDS PO) Take 2 capsules by mouth daily.    . nitroGLYCERIN (NITROSTAT) 0.4 MG SL tablet Place 1 tablet (0.4 mg total) under the tongue every 5 (five) minutes as needed for chest pain. 25 tablet 3  . Omega-3 Fatty Acids (FISH OIL CONCENTRATE PO) Take 1 capsule by mouth daily.     . ondansetron (ZOFRAN) 8 MG tablet Take 1 tablet (8 mg total) by mouth every 8 (eight) hours as needed for nausea. 30 tablet 3  .  vardenafil (LEVITRA) 10 MG  tablet Take 10 mg by mouth as directed.    . venetoclax (VENCLEXTA) 100 MG TABS Take 400 mg by mouth daily. Take with a meal and a glass of water at approximately the same time daily. 120 tablet 1   No current facility-administered medications for this visit.     REVIEW OF SYSTEMS:    A 10+ POINT REVIEW OF SYSTEMS WAS OBTAINED including neurology, dermatology, psychiatry, cardiac, respiratory, lymph, extremities, GI, GU, Musculoskeletal, constitutional, breasts, reproductive, HEENT.  All pertinent positives are noted in the HPI.  All others are negative.   PHYSICAL EXAMINATION: ECOG PERFORMANCE STATUS: 1 - Symptomatic but completely ambulatory  Vitals:   06/16/18 0854  BP: 123/74  Pulse: 81  Resp: 18  Temp: 97.7 F (36.5 C)  SpO2: 100%   Filed Weights   06/16/18 0854  Weight: 162 lb 6.4 oz (73.7 kg)   .Body mass index is 20.85 kg/m.   GENERAL:alert, in no acute distress and comfortable SKIN: no acute rashes, no significant lesions EYES: conjunctiva are pink and non-injected, sclera anicteric OROPHARYNX: MMM, no exudates, no oropharyngeal erythema or ulceration NECK: supple, no JVD LYMPH:  no palpable lymphadenopathy in the cervical, axillary or inguinal regions LUNGS: clear to auscultation b/l with normal respiratory effort HEART: regular rate & rhythm ABDOMEN:  normoactive bowel sounds , non tender, not distended. No palpable hepatosplenomegaly.  Extremity: no pedal edema PSYCH: alert & oriented x 3 with fluent speech NEURO: no focal motor/sensory deficits   LABORATORY DATA:  I have reviewed the data as listed  . CBC Latest Ref Rng & Units 06/16/2018 06/09/2018 06/02/2018  WBC 4.0 - 10.5 K/uL 7.8 7.1 4.7  Hemoglobin 13.0 - 17.0 g/dL 10.9(L) 10.4(L) 10.2(L)  Hematocrit 39.0 - 52.0 % 34.5(L) 32.4(L) 31.8(L)  Platelets 150 - 400 K/uL 66(L) 47(L) 99(L)  ANC 700<---600<---1200  . CMP Latest Ref Rng & Units 06/16/2018 06/09/2018 06/02/2018  Glucose  70 - 99 mg/dL 151(H) 118(H) 163(H)  BUN 8 - 23 mg/dL 30(H) 30(H) 27(H)  Creatinine 0.61 - 1.24 mg/dL 0.91 0.86 0.88  Sodium 135 - 145 mmol/L 136 139 138  Potassium 3.5 - 5.1 mmol/L 4.1 3.7 4.9  Chloride 98 - 111 mmol/L 100 102 102  CO2 22 - 32 mmol/L 28 27 26   Calcium 8.9 - 10.3 mg/dL 8.6(L) 8.5(L) 8.3(L)  Total Protein 6.5 - 8.1 g/dL 6.3(L) 6.1(L) 6.5  Total Bilirubin 0.3 - 1.2 mg/dL 1.2 1.8(H) 0.7  Alkaline Phos 38 - 126 U/L 56 50 64  AST 15 - 41 U/L 15 9(L) 14(L)  ALT 0 - 44 U/L 36 25 29    04/30/18 Outside Labs:                RADIOGRAPHIC STUDIES: I have personally reviewed the radiological images as listed and agreed with the findings in the report. No results found.  ASSESSMENT & PLAN:  68 y.o. male with  1. Chronic Lymphocytic Leukemia Initial diagnosis on 02/13/15 Peripheral blood flow cytometry which revealed a Monoclonal B-cell population 09/25/15 Cytogenetics ruled out Mantle cell lymphoma. 12/25/17 FISH study ruled out translocation 11;14 12/30/17 BM Biopsy revealed CLL without evidence of transformation  03/17/18 FISH CLL Prognostic panel revealed an 11q deletion and Trisomy 12  03/19/18 CT N/C/A/P revealed NECK: Multiple enlarged bilateral cervical lymph nodes consistent with the given diagnosis of CLL. The largest node is a right level 3 lymph node measuring 3.4 x 2.8 x 1.2 cm. 2. Degenerative changes of the cervical spine. 3. Hypodense lesion  in the right side of the tongue measuring up to 1.5 cm. This could represent focal infection. Neoplasm is considered less likely. The lesion appears to be superficial along the right lateral aspect of the anterior tongue a. This should be amenable to direct inspection, C/A/P: Marked adenopathy within the chest, abdomen, and pelvis, consistent with active lymphoma/leukemia. 2. Splenomegaly is nonspecific. Splenic involvement cannot be excluded. 3. Right greater than left and basilar predominant peribronchovascular  interstitial thickening and nodularity. Considerations include pulmonary involvement of lymphoma and/or Infection/aspiration. 4. Coronary artery atherosclerosis. Aortic Atherosclerosis. 5. Trace nonspecific pelvic fluid.  03/26/18 Left cervical LN biopsy revealed SLL/CLL  PLAN: -Discussed pt labwork today, 06/16/18; WBC at 7.8k with ANC improved to 1.2k, hemoglobin continues to improve now to 10.9, PLT improved to 66k, liver and kidney functions are stable -Will now hold Allopurinol with WBC normalized, Uric acid low at 3.4, and could be causing an element of marrow suppression -Will plan to taper dexamethasone, decreasing to 2mg  in one week if no fevers or problems tolerating Venetoclax -The pt has no prohibitive toxicities from increasing to 200mg  Venetoclax at this time. -Pt has had neutropenia prior to treatment, and now his neutropenia is a balancing act between suppression from CLL and medication effect of Venetoclax. ANC has been steadily improving, now to 1.2k up from 800 last week -Will tentatively plan to begin Gazyva in the next month -Will check labs again in 2 weeks -Continue premeds of Tylenol and Dexamethasone as well. -Pt is staying well hydrated, no indication for IVF at this time -Recommended that the pt continue to eat well, drink at least 48-64 oz of water each day, and walk 20-30 minutes each day.   -Advised crowd avoidance, infection prevention strategies, and frequent hand washing. -Will see the pt back in 2 weeks   May cancel lab appointment on 5/19 Plz schedule labs and MD visit on 06/30/2018 around noon/early afternoon per patient preference   All of the patients questions were answered with apparent satisfaction. The patient knows to call the clinic with any problems, questions or concerns.  The total time spent in the appt was 25 minutes and more than 50% was on counseling and direct patient cares.    Sullivan Lone MD MS AAHIVMS [facility] Hematology/Oncology  Physician [facility]  (Office):       [phone] (Work cell):  [phone] (Fax):           [phone]  06/16/2018 9:50 AM  I, Baldwin Jamaica, am acting as a scribe for Dr. Sullivan Lone.   .I have reviewed the above documentation for accuracy and completeness, and I agree with the above. Brunetta Genera MD

## 2018-06-16 ENCOUNTER — Inpatient Hospital Stay (HOSPITAL_BASED_OUTPATIENT_CLINIC_OR_DEPARTMENT_OTHER): Payer: BC Managed Care – PPO | Admitting: Hematology

## 2018-06-16 ENCOUNTER — Inpatient Hospital Stay: Payer: BC Managed Care – PPO

## 2018-06-16 ENCOUNTER — Other Ambulatory Visit: Payer: Self-pay

## 2018-06-16 VITALS — BP 123/74 | HR 81 | Temp 97.7°F | Resp 18 | Ht 74.0 in | Wt 162.4 lb

## 2018-06-16 DIAGNOSIS — Z79899 Other long term (current) drug therapy: Secondary | ICD-10-CM

## 2018-06-16 DIAGNOSIS — C9110 Chronic lymphocytic leukemia of B-cell type not having achieved remission: Secondary | ICD-10-CM

## 2018-06-16 DIAGNOSIS — N400 Enlarged prostate without lower urinary tract symptoms: Secondary | ICD-10-CM

## 2018-06-16 DIAGNOSIS — Z85828 Personal history of other malignant neoplasm of skin: Secondary | ICD-10-CM | POA: Diagnosis not present

## 2018-06-16 DIAGNOSIS — K149 Disease of tongue, unspecified: Secondary | ICD-10-CM | POA: Diagnosis not present

## 2018-06-16 DIAGNOSIS — I700 Atherosclerosis of aorta: Secondary | ICD-10-CM

## 2018-06-16 DIAGNOSIS — D702 Other drug-induced agranulocytosis: Secondary | ICD-10-CM

## 2018-06-16 DIAGNOSIS — N529 Male erectile dysfunction, unspecified: Secondary | ICD-10-CM | POA: Diagnosis not present

## 2018-06-16 DIAGNOSIS — R161 Splenomegaly, not elsewhere classified: Secondary | ICD-10-CM | POA: Diagnosis not present

## 2018-06-16 DIAGNOSIS — I2510 Atherosclerotic heart disease of native coronary artery without angina pectoris: Secondary | ICD-10-CM | POA: Diagnosis not present

## 2018-06-16 LAB — COMPREHENSIVE METABOLIC PANEL
ALT: 36 U/L (ref 0–44)
AST: 15 U/L (ref 15–41)
Albumin: 3.8 g/dL (ref 3.5–5.0)
Alkaline Phosphatase: 56 U/L (ref 38–126)
Anion gap: 8 (ref 5–15)
BUN: 30 mg/dL — ABNORMAL HIGH (ref 8–23)
CO2: 28 mmol/L (ref 22–32)
Calcium: 8.6 mg/dL — ABNORMAL LOW (ref 8.9–10.3)
Chloride: 100 mmol/L (ref 98–111)
Creatinine, Ser: 0.91 mg/dL (ref 0.61–1.24)
GFR calc Af Amer: >60 mL/min (ref >60)
GFR calc non Af Amer: >60 mL/min (ref >60)
Glucose, Bld: 151 mg/dL — ABNORMAL HIGH (ref 70–99)
Potassium: 4.1 mmol/L (ref 3.5–5.1)
Sodium: 136 mmol/L (ref 135–145)
Total Bilirubin: 1.2 mg/dL (ref 0.3–1.2)
Total Protein: 6.3 g/dL — ABNORMAL LOW (ref 6.5–8.1)

## 2018-06-16 LAB — CBC WITH DIFFERENTIAL/PLATELET
Abs Immature Granulocytes: 0 10*3/uL (ref 0.00–0.07)
Band Neutrophils: 3 %
Basophils Absolute: 0 10*3/uL (ref 0.0–0.1)
Basophils Relative: 0 %
Eosinophils Absolute: 0 10*3/uL (ref 0.0–0.5)
Eosinophils Relative: 0 %
HCT: 34.5 % — ABNORMAL LOW (ref 39.0–52.0)
Hemoglobin: 10.9 g/dL — ABNORMAL LOW (ref 13.0–17.0)
Lymphocytes Relative: 70 %
Lymphs Abs: 5.5 10*3/uL — ABNORMAL HIGH (ref 0.7–4.0)
MCH: 41 pg — ABNORMAL HIGH (ref 26.0–34.0)
MCHC: 31.6 g/dL (ref 30.0–36.0)
MCV: 129.7 fL — ABNORMAL HIGH (ref 80.0–100.0)
Monocytes Absolute: 1.1 10*3/uL — ABNORMAL HIGH (ref 0.1–1.0)
Monocytes Relative: 14 %
Neutro Abs: 1.2 10*3/uL — ABNORMAL LOW (ref 1.7–17.7)
Neutrophils Relative %: 13 %
Platelets: 66 10*3/uL — ABNORMAL LOW (ref 150–400)
RBC: 2.66 MIL/uL — ABNORMAL LOW (ref 4.22–5.81)
RDW: 16.9 % — ABNORMAL HIGH (ref 11.5–15.5)
WBC: 7.8 10*3/uL (ref 4.0–10.5)
nRBC: 0 % (ref 0.0–0.2)

## 2018-06-16 LAB — PHOSPHORUS: Phosphorus: 3.9 mg/dL (ref 2.5–4.6)

## 2018-06-16 LAB — URIC ACID: Uric Acid, Serum: 3.4 mg/dL — ABNORMAL LOW (ref 3.7–8.6)

## 2018-06-16 LAB — LACTATE DEHYDROGENASE: LDH: 123 U/L (ref 98–192)

## 2018-06-17 ENCOUNTER — Telehealth: Payer: Self-pay | Admitting: Hematology

## 2018-06-17 NOTE — Telephone Encounter (Signed)
Scheduled appt per 5/12 los. °

## 2018-06-19 ENCOUNTER — Telehealth: Payer: Self-pay | Admitting: Hematology

## 2018-06-19 NOTE — Telephone Encounter (Signed)
Called pt per 5/15 sch message- unable to reach patient - left message for patient to call back to reschedule appt

## 2018-06-23 ENCOUNTER — Other Ambulatory Visit: Payer: BLUE CROSS/BLUE SHIELD

## 2018-06-26 NOTE — Progress Notes (Signed)
HEMATOLOGY/ONCOLOGY CLINIC NOTE  Date of Service: 06/30/2018  Patient Care Team: [name], MD as PCP - General (Internal Medicine) Sherren Mocha, MD as PCP - Cardiology (Cardiology)  CHIEF COMPLAINTS/PURPOSE OF CONSULTATION:  Chronic Lymphocytic Leukemia  Oncologic History:     Chronic lymphocytic leukemia (CLL), B-cell ([place])   02/14/2015 Initial Diagnosis    Monoclonal B-cell population CD5 and CD20 positive, differential diagnosis CLL versus mantle cell lymphoma ( patient had absolute lymphocyte count of 8000 in 2010), remained stable.      HISTORY OF PRESENTING ILLNESS:  Jake Kennedy is a wonderful 68 y.o. male who has been referred to [place] by Dr. [name] for evaluation and management of Chronic Lymphocytic Leukemia. The pt was formerly under the care of my colleague Dr. Nicholas Lose. He is accompanied today by his wife. The pt reports that he is doing well overall.  The pt reports that his course of CLL had been pretty consistent since diagnosis in 2017, until roughly 3 months ago in roughly November 2019. He notes that he has had recent increase in fatigue in the last 3 months, and more acutely in the last 3 weeks. He endorses occasional fevers over the last two weeks during the night, highest at 100.2 or 100.3. He also endorses some night sweats, not every night, some of these being "drenching." He notes that he has been eating well for the last 6 months, however has lost 8 pounds in that interim. He endorses regular exercising until the past month, due to his worsened fatigue, and had previously regularly run upwards of 3 miles and bike for several miles as well.  The pt notes that he can't smell very well, in the last 2-3 weeks, and his hearing has also reduced in the last week. He senses that his enlarged lymph nodes are pressing on his neck, and characterizes this as similar to when one has an acute cold and congestion. He had acute pain in his right hip a week ago  and saw my colleague Sandi Mealy, PA in Symptom Managment. He was given Norco but denies taking this because he didn't want to. He endorses a positional element to his hip pain, which e attributes to the pain being from an enlarged lymph node. He notes that his hip pain had sudden onset on the night of 03/18/18. He notes that his neck lymph nodes have grown in the last 3 weeks to a month. The pt notes that his lymph nodes in his neck, armpits, and groin have been enlarged.  The pt tried an antibiotic course 2 weeks ago with his PCP for a suspected sinus infection, during which his cervical lymph nodes enlarged further. He notes that his neck lymph nodes are somewhat improved today as compared to this. The pt denies frequent infections.   Of note prior to the patient's visit today, pt has had CT N/C/A/P completed on 03/19/18 with results revealing NECK: Multiple enlarged bilateral cervical lymph nodes consistent with the given diagnosis of CLL. The largest node is a right level 3 lymph node measuring 3.4 x 2.8 x 1.2 cm. 2. Degenerative changes of the cervical spine. 3. Hypodense lesion in the right side of the tongue measuring up to 1.5 cm. This could represent focal infection. Neoplasm is considered less likely. The lesion appears to be superficial along the right lateral aspect of the anterior tongue a. This should be amenable to direct inspection, C/A/P: Marked adenopathy within the chest, abdomen, and pelvis, consistent  with active lymphoma/leukemia. 2. Splenomegaly is nonspecific. Splenic involvement cannot be excluded. 3. Right greater than left and basilar predominant peribronchovascular interstitial thickening and nodularity. Considerations include pulmonary involvement of lymphoma and/or Infection/aspiration. 4. Coronary artery atherosclerosis. Aortic Atherosclerosis. 5. Trace nonspecific pelvic fluid.  Most recent lab results (03/17/18) of CBC w/diff and CMP is as follows: all values are WNL except for  WBC at 13.1k, RBC at 2.67, HGB at 10.1, HCT at 31.0, MCV at 116.1, MCH at 37.8, PLT at 103k, ANC at 900, Lymphs abs at 11.1k, BUN at 31, Calcium at 8.7, Total Protein at 6.2, Albumin at 3.4. 03/17/18 LDH at 241  On review of systems, pt reports new fatigue, recent fevers, recent night sweats, recently grown neck lymph nodes, eating well, some weight loss, right groin pain, and denies chills, SOB, ear pain, pain along the spine, frequent infections, flank pain, leg swelling, testicular pain or swelling, and any other symptoms.  Interval History:   MATTI MINNEY returns today for management and evaluation of his CLL. The patient's last visit with [place] was on 06/16/18.  The pt reports that he is feeling "truly, very good." He notes that he has had a few muscle cramps but nothing persistent. He notes that he has continued tolerating the 200mg  Venetoclax well and denies fevers or concerns for infections. He denies any fatigue or chills as well. The pt has not noticed any new lumps or bumps.   Lab results today (06/30/18) of CBC w/diff and CMP is as follows: all values are WNL except for WBC at 3.4k, RBC at 2.83, HGB at 11.2, HCT at 35.3, MCV at 124.7, MCH at 39.6, RDW at 15.7, PLT at 135k, ANC at 1.3k, Glucose at 108, BUN at 30, Calcium at 8.4, Total Protein at 5.9, Albumin at 3.4, AST at 13.  On review of systems, pt reports good energy levels, eating well, staying active, moving his bowels well, and denies concerns for infections, fevers, fatigue, chills, noticing any new lumps or bumps, abdominal pains, leg swelling, and any other symptoms.   MEDICAL HISTORY:  Past Medical History:  Diagnosis Date  . Basal cell carcinoma    moles removed in 2007 and 2012 from L nose and L hip  . BPH (benign prostatic hypertrophy)   . CAD (coronary artery disease), native coronary artery    a. 02/10/2015 95% mid LAD stenosis s/p DES  b. 07/15/16: LHC showed stable non obst dz and patent stent  . CLL (chronic lymphocytic  leukemia) ([place])   . ED (erectile dysfunction)   . Lymphocytosis 02/11/2015   seen by Dr. Lindi Adie 02/11/2015, working up for possible CLL    SURGICAL HISTORY: Past Surgical History:  Procedure Laterality Date  . CARDIAC CATHETERIZATION  02/10/2015  . CARDIAC CATHETERIZATION N/A 02/10/2015   Procedure: Left Heart Cath and Coronary Angiography;  Surgeon: Sherren Mocha, MD;  Location: [facility] [place] [facility];  Service: Cardiovascular;  Laterality: N/A;  . CARDIAC CATHETERIZATION N/A 02/10/2015   Procedure: Coronary Stent Intervention;  Surgeon: Sherren Mocha, MD;  Location: [facility];  Service: Cardiovascular;  Laterality: N/A;  . CORONARY STENT PLACEMENT  02/10/2015   DES to LAD  . LAMINECTOMY    . LEFT HEART CATH AND CORONARY ANGIOGRAPHY N/A 07/15/2016   Procedure: Left Heart Cath and Coronary Angiography;  Surgeon: Martinique, Peter M, MD;  Location: [facility];  Service: Cardiovascular;  Laterality: N/A;  . TONSILLECTOMY      SOCIAL HISTORY: Social History  Socioeconomic History  . Marital status: Married    Spouse name: Not on file  . Number of children: Not on file  . Years of education: Not on file  . Highest education level: Not on file  Occupational History  . Not on file  Social Needs  . Financial resource strain: Not on file  . Food insecurity:    Worry: Not on file    Inability: Not on file  . Transportation needs:    Medical: Not on file    Non-medical: Not on file  Tobacco Use  . Smoking status: Never Smoker  . Smokeless tobacco: Never Used  Substance and Sexual Activity  . Alcohol use: Yes    Alcohol/week: 0.0 standard drinks    Comment: occasional  . Drug use: No  . Sexual activity: Not on file  Lifestyle  . Physical activity:    Days per week: Not on file    Minutes per session: Not on file  . Stress: Not on file  Relationships  . Social connections:    Talks on phone: Not on file    Gets together: Not on file    Attends religious service: Not  on file    Active member of club or organization: Not on file    Attends meetings of clubs or organizations: Not on file    Relationship status: Not on file  . Intimate partner violence:    Fear of current or ex partner: Not on file    Emotionally abused: Not on file    Physically abused: Not on file    Forced sexual activity: Not on file  Other Topics Concern  . Not on file  Social History Narrative  . Not on file    FAMILY HISTORY: Family History  Problem Relation Age of Onset  . Diabetes Mother   . Heart failure Mother   . Heart attack Mother        occured in 34s.   . Healthy Sister   . Healthy Brother   . Healthy Maternal Grandmother   . Healthy Maternal Grandfather   . Healthy Paternal Grandmother   . Healthy Paternal Grandfather   . Healthy Brother     ALLERGIES:  is allergic to adhesive [tape] and latex.  MEDICATIONS:  Current Outpatient Medications  Medication Sig Dispense Refill  . aspirin 81 MG tablet Take 81 mg by mouth daily.    Marland Kitchen atorvastatin (LIPITOR) 80 MG tablet Take 1 tablet (80 mg total) by mouth daily at 6 PM. 90 tablet 3  . dexamethasone (DECADRON) 4 MG tablet TAKE 1 TABLET BY MOUTH DAILY 30 TO 45 MINUTES PRIOR TO VENCLEXTA WITH ZOFRAN AND ACETAMINOPHEN 30 tablet 2  . Multiple Vitamin (MULTIVITAMIN) tablet Take 1 tablet by mouth daily.    . Multiple Vitamins-Minerals (PRESERVISION AREDS PO) Take 2 capsules by mouth daily.    . nitroGLYCERIN (NITROSTAT) 0.4 MG SL tablet Place 1 tablet (0.4 mg total) under the tongue every 5 (five) minutes as needed for chest pain. 25 tablet 3  . Omega-3 Fatty Acids (FISH OIL CONCENTRATE PO) Take 1 capsule by mouth daily.     . ondansetron (ZOFRAN) 8 MG tablet Take 1 tablet (8 mg total) by mouth every 8 (eight) hours as needed for nausea. 30 tablet 3  . vardenafil (LEVITRA) 10 MG tablet Take 10 mg by mouth as directed.    . venetoclax (VENCLEXTA) 100 MG TABS Take 400 mg by mouth daily. Take with a meal and a  glass of  water at approximately the same time daily. 120 tablet 1   No current facility-administered medications for this visit.     REVIEW OF SYSTEMS:    A 10+ POINT REVIEW OF SYSTEMS WAS OBTAINED including neurology, dermatology, psychiatry, cardiac, respiratory, lymph, extremities, GI, GU, Musculoskeletal, constitutional, breasts, reproductive, HEENT.  All pertinent positives are noted in the HPI.  All others are negative.   PHYSICAL EXAMINATION: ECOG PERFORMANCE STATUS: 1 - Symptomatic but completely ambulatory  Vitals:   06/30/18 1222  BP: (!) 141/94  Pulse: 86  Resp: 18  Temp: 98.5 F (36.9 C)  SpO2: 98%   Filed Weights   06/30/18 1222  Weight: 163 lb 8 oz (74.2 kg)   .Body mass index is 20.99 kg/m.   GENERAL:alert, in no acute distress and comfortable SKIN: no acute rashes, no significant lesions EYES: conjunctiva are pink and non-injected, sclera anicteric OROPHARYNX: MMM, no exudates, no oropharyngeal erythema or ulceration NECK: supple, no JVD LYMPH:  no palpable lymphadenopathy in the cervical, axillary or inguinal regions LUNGS: clear to auscultation b/l with normal respiratory effort HEART: regular rate & rhythm ABDOMEN:  normoactive bowel sounds , non tender, not distended. No palpable hepatosplenomegaly.  Extremity: no pedal edema PSYCH: alert & oriented x 3 with fluent speech NEURO: no focal motor/sensory deficits   LABORATORY DATA:  I have reviewed the data as listed  . CBC Latest Ref Rng & Units 06/30/2018 06/16/2018 06/09/2018  WBC 4.0 - 10.5 K/uL 3.4(L) 7.8 7.1  Hemoglobin 13.0 - 17.0 g/dL 11.2(L) 10.9(L) 10.4(L)  Hematocrit 39.0 - 52.0 % 35.3(L) 34.5(L) 32.4(L)  Platelets 150 - 400 K/uL 135(L) 66(L) 47(L)  ANC 700<---600<---1200  . CMP Latest Ref Rng & Units 06/30/2018 06/16/2018 06/09/2018  Glucose 70 - 99 mg/dL 108(H) 151(H) 118(H)  BUN 8 - 23 mg/dL 30(H) 30(H) 30(H)  Creatinine 0.61 - 1.24 mg/dL 0.79 0.91 0.86  Sodium 135 - 145 mmol/L 137 136 139   Potassium 3.5 - 5.1 mmol/L 5.0 4.1 3.7  Chloride 98 - 111 mmol/L 103 100 102  CO2 22 - 32 mmol/L 25 28 27   Calcium 8.9 - 10.3 mg/dL 8.4(L) 8.6(L) 8.5(L)  Total Protein 6.5 - 8.1 g/dL 5.9(L) 6.3(L) 6.1(L)  Total Bilirubin 0.3 - 1.2 mg/dL 1.0 1.2 1.8(H)  Alkaline Phos 38 - 126 U/L 46 56 50  AST 15 - 41 U/L 13(L) 15 9(L)  ALT 0 - 44 U/L 27 36 25    04/30/18 Outside Labs:                RADIOGRAPHIC STUDIES: I have personally reviewed the radiological images as listed and agreed with the findings in the report. No results found.  ASSESSMENT & PLAN:  68 y.o. male with  1. Chronic Lymphocytic Leukemia Initial diagnosis on 02/13/15 Peripheral blood flow cytometry which revealed a Monoclonal B-cell population 09/25/15 Cytogenetics ruled out Mantle cell lymphoma. 12/25/17 FISH study ruled out translocation 11;14 12/30/17 BM Biopsy revealed CLL without evidence of transformation  03/17/18 FISH CLL Prognostic panel revealed an 11q deletion and Trisomy 12  03/19/18 CT N/C/A/P revealed NECK: Multiple enlarged bilateral cervical lymph nodes consistent with the given diagnosis of CLL. The largest node is a right level 3 lymph node measuring 3.4 x 2.8 x 1.2 cm. 2. Degenerative changes of the cervical spine. 3. Hypodense lesion in the right side of the tongue measuring up to 1.5 cm. This could represent focal infection. Neoplasm is considered less likely. The lesion appears to be  superficial along the right lateral aspect of the anterior tongue a. This should be amenable to direct inspection, C/A/P: Marked adenopathy within the chest, abdomen, and pelvis, consistent with active lymphoma/leukemia. 2. Splenomegaly is nonspecific. Splenic involvement cannot be excluded. 3. Right greater than left and basilar predominant peribronchovascular interstitial thickening and nodularity. Considerations include pulmonary involvement of lymphoma and/or Infection/aspiration. 4. Coronary artery atherosclerosis.  Aortic Atherosclerosis. 5. Trace nonspecific pelvic fluid.  03/26/18 Left cervical LN biopsy revealed SLL/CLL  PLAN: -Discussed pt labwork today, 06/30/18; ANC improved to 1.3k, PLT improved to 135k, lymphocytes counts are normal. HGB improved to 11.2 -The pt has no prohibitive toxicities from continuing 200mg  Venetoclax at this time. Will hold this dose without escalating to continue allowing counts to stabilize. -Will now taper to 2mg  Dexamethasone for one week, then to 1mg  for one week, then 1mg  every other day for one week, then stop. Tapering only if pt does not develop fevers. -Discussed the option to either pursue Gazyva now for a potentially deeper response with possible ability to hold Venetoclax at one year mark vs continuing 200mg  Venetoclax until progression and then adding Gazyva or considering other treatments. -Pt prefers to begin [place] in the next 1-2 weeks, which will be on D1, D2, D8 and D15, then once a month for 6 cycles -Discussed that as PLT are improving alongside treatment, suspect that this his thrombocytopenia was related to this CLL -Pt also had neutropenia prior to treatment, and now his neutropenia is a balancing act between suppression from CLL and medication effect of Venetoclax. ANC has been steadily improving, now to 1.3k up from 800 -Continue premeds of Tylenol and Dexamethasone as well. -Pt is staying well hydrated, no indication for IVF at this time -Recommended that the pt continue to eat well, drink at least 48-64 oz of water each day, and walk 20-30 minutes each day. -Advised crowd avoidance, infection prevention strategies, and frequent hand washing. -Will see the pt back on C1D2 Gazyva infusion   Plz schedule to start Gazyva in 1 week Cycle 1 will be Day 1 , Day2, Day8 and Day 15. Labs on D1,8 and 15 MD visit on C1D2 and C1D15    All of the patients questions were answered with apparent satisfaction. The patient knows to call the clinic with any  problems, questions or concerns.  The total time spent in the appt was 25 minutes and more than 50% was on counseling and direct patient cares.     Sullivan Lone MD MS AAHIVMS [facility] Hematology/Oncology Physician [facility]  (Office):       [phone] (Work cell):  [phone] (Fax):           [phone]  06/30/2018 1:03 PM  I, Baldwin Jamaica, am acting as a scribe for Dr. Sullivan Lone.   .I have reviewed the above documentation for accuracy and completeness, and I agree with the above. Brunetta Genera MD

## 2018-06-30 ENCOUNTER — Inpatient Hospital Stay: Payer: BC Managed Care – PPO

## 2018-06-30 ENCOUNTER — Other Ambulatory Visit: Payer: Self-pay

## 2018-06-30 ENCOUNTER — Other Ambulatory Visit: Payer: BLUE CROSS/BLUE SHIELD

## 2018-06-30 ENCOUNTER — Inpatient Hospital Stay (HOSPITAL_BASED_OUTPATIENT_CLINIC_OR_DEPARTMENT_OTHER): Payer: BC Managed Care – PPO | Admitting: Hematology

## 2018-06-30 DIAGNOSIS — I2510 Atherosclerotic heart disease of native coronary artery without angina pectoris: Secondary | ICD-10-CM | POA: Diagnosis not present

## 2018-06-30 DIAGNOSIS — Z85828 Personal history of other malignant neoplasm of skin: Secondary | ICD-10-CM

## 2018-06-30 DIAGNOSIS — C9110 Chronic lymphocytic leukemia of B-cell type not having achieved remission: Secondary | ICD-10-CM

## 2018-06-30 DIAGNOSIS — I700 Atherosclerosis of aorta: Secondary | ICD-10-CM

## 2018-06-30 DIAGNOSIS — K149 Disease of tongue, unspecified: Secondary | ICD-10-CM | POA: Diagnosis not present

## 2018-06-30 DIAGNOSIS — N400 Enlarged prostate without lower urinary tract symptoms: Secondary | ICD-10-CM | POA: Diagnosis not present

## 2018-06-30 DIAGNOSIS — Z7189 Other specified counseling: Secondary | ICD-10-CM

## 2018-06-30 DIAGNOSIS — Z79899 Other long term (current) drug therapy: Secondary | ICD-10-CM | POA: Diagnosis not present

## 2018-06-30 DIAGNOSIS — R161 Splenomegaly, not elsewhere classified: Secondary | ICD-10-CM

## 2018-06-30 DIAGNOSIS — N529 Male erectile dysfunction, unspecified: Secondary | ICD-10-CM | POA: Diagnosis not present

## 2018-06-30 DIAGNOSIS — D702 Other drug-induced agranulocytosis: Secondary | ICD-10-CM

## 2018-06-30 DIAGNOSIS — Z9189 Other specified personal risk factors, not elsewhere classified: Secondary | ICD-10-CM

## 2018-06-30 LAB — CMP (CANCER CENTER ONLY)
ALT: 27 U/L (ref 0–44)
AST: 13 U/L — ABNORMAL LOW (ref 15–41)
Albumin: 3.4 g/dL — ABNORMAL LOW (ref 3.5–5.0)
Alkaline Phosphatase: 46 U/L (ref 38–126)
Anion gap: 9 (ref 5–15)
BUN: 30 mg/dL — ABNORMAL HIGH (ref 8–23)
CO2: 25 mmol/L (ref 22–32)
Calcium: 8.4 mg/dL — ABNORMAL LOW (ref 8.9–10.3)
Chloride: 103 mmol/L (ref 98–111)
Creatinine: 0.79 mg/dL (ref 0.61–1.24)
GFR, Est AFR Am: >60 mL/min (ref >60)
GFR, Estimated: >60 mL/min (ref >60)
Glucose, Bld: 108 mg/dL — ABNORMAL HIGH (ref 70–99)
Potassium: 5 mmol/L (ref 3.5–5.1)
Sodium: 137 mmol/L (ref 135–145)
Total Bilirubin: 1 mg/dL (ref 0.3–1.2)
Total Protein: 5.9 g/dL — ABNORMAL LOW (ref 6.5–8.1)

## 2018-06-30 LAB — CBC WITH DIFFERENTIAL/PLATELET
Abs Immature Granulocytes: 0.01 10*3/uL (ref 0.00–0.07)
Basophils Absolute: 0 10*3/uL (ref 0.0–0.1)
Basophils Relative: 0 %
Eosinophils Absolute: 0 10*3/uL (ref 0.0–0.5)
Eosinophils Relative: 0 %
HCT: 35.3 % — ABNORMAL LOW (ref 39.0–52.0)
Hemoglobin: 11.2 g/dL — ABNORMAL LOW (ref 13.0–17.0)
Immature Granulocytes: 0 %
Lymphocytes Relative: 45 %
Lymphs Abs: 1.5 10*3/uL (ref 0.7–4.0)
MCH: 39.6 pg — ABNORMAL HIGH (ref 26.0–34.0)
MCHC: 31.7 g/dL (ref 30.0–36.0)
MCV: 124.7 fL — ABNORMAL HIGH (ref 80.0–100.0)
Monocytes Absolute: 0.5 10*3/uL (ref 0.1–1.0)
Monocytes Relative: 16 %
Neutro Abs: 1.3 10*3/uL — ABNORMAL LOW (ref 1.7–7.7)
Neutrophils Relative %: 39 %
Platelets: 135 10*3/uL — ABNORMAL LOW (ref 150–400)
RBC: 2.83 MIL/uL — ABNORMAL LOW (ref 4.22–5.81)
RDW: 15.7 % — ABNORMAL HIGH (ref 11.5–15.5)
WBC: 3.4 10*3/uL — ABNORMAL LOW (ref 4.0–10.5)
nRBC: 0 % (ref 0.0–0.2)

## 2018-06-30 MED ORDER — DEXAMETHASONE 1 MG PO TABS: ORAL_TABLET | ORAL | 0 refills | Status: DC

## 2018-07-01 ENCOUNTER — Telehealth: Payer: Self-pay | Admitting: Hematology

## 2018-07-01 NOTE — Telephone Encounter (Signed)
Scheduled appt per 5/26 los.  Added first treatment to the book for approval (6/4).  Will contact patient once treatment has been added.

## 2018-07-02 ENCOUNTER — Telehealth: Payer: Self-pay | Admitting: *Deleted

## 2018-07-02 NOTE — Telephone Encounter (Signed)
Patient left voice mail: asked about future impact of stopping Venetoclax and what is the impact of delaying the start of Gazyva. Questions give to Dr. Irene Limbo

## 2018-07-06 DIAGNOSIS — Z7189 Other specified counseling: Secondary | ICD-10-CM | POA: Insufficient documentation

## 2018-07-06 MED FILL — VENCLEXTA 100 MG TABS: 100 | 30 days supply | Qty: 120 | Fill #1

## 2018-07-06 NOTE — Progress Notes (Signed)
Patient on plan of care prior to pathways. 

## 2018-07-06 NOTE — Progress Notes (Signed)
START ON PATHWAY REGIMEN - Lymphoma and CLL     Cycle 1: A cycle is 28 days:     Venetoclax      Obinutuzumab      Obinutuzumab      Obinutuzumab    Cycle 2: A cycle is 28 days:     Venetoclax      Venetoclax      Venetoclax      Venetoclax      Obinutuzumab    Cycles 3 through 6: A cycle is 28 days:     Venetoclax      Obinutuzumab    Cycles 7 through 12: A cycle is 28 days:     Venetoclax   **Always confirm dose/schedule in your pharmacy ordering system**  Patient Characteristics: Chronic Lymphocytic Leukemia (CLL), First Line, Treatment Indicated, 17p del (+) or ATM Mutation Positive Disease Type: Chronic Lymphocytic Leukemia (CLL) Disease Type: Not Applicable Disease Type: Not Applicable Line of Therapy: First Line RAI Stage: Unknown Treatment Indicated<= Treatment Indicated ATM Mutation Status: Positive 17p Deletion Status: Negative Intent of Therapy: Non-Curative / Palliative Intent, Discussed with Patient

## 2018-07-08 ENCOUNTER — Telehealth: Payer: Self-pay | Admitting: *Deleted

## 2018-07-08 ENCOUNTER — Other Ambulatory Visit: Payer: Self-pay | Admitting: Hematology

## 2018-07-08 DIAGNOSIS — C9110 Chronic lymphocytic leukemia of B-cell type not having achieved remission: Secondary | ICD-10-CM

## 2018-07-08 NOTE — Telephone Encounter (Signed)
Patient left voice mail: He left a message a few days ago. Starting new medication tomorrow and would like to discuss with Dr.Kale asked about future impact of stopping Venetoclax and what is the impact of delaying the start of Gazyva. Questions routed to MD.

## 2018-07-09 ENCOUNTER — Inpatient Hospital Stay: Payer: BC Managed Care – PPO | Attending: Hematology and Oncology

## 2018-07-09 ENCOUNTER — Inpatient Hospital Stay: Payer: BC Managed Care – PPO

## 2018-07-09 ENCOUNTER — Other Ambulatory Visit: Payer: Self-pay | Admitting: Hematology

## 2018-07-09 ENCOUNTER — Other Ambulatory Visit: Payer: Self-pay

## 2018-07-09 ENCOUNTER — Inpatient Hospital Stay (HOSPITAL_BASED_OUTPATIENT_CLINIC_OR_DEPARTMENT_OTHER): Payer: BC Managed Care – PPO | Admitting: Medical

## 2018-07-09 VITALS — BP 132/90 | HR 84 | Temp 98.1°F | Resp 20

## 2018-07-09 DIAGNOSIS — R590 Localized enlarged lymph nodes: Secondary | ICD-10-CM | POA: Diagnosis not present

## 2018-07-09 DIAGNOSIS — T8090XA Unspecified complication following infusion and therapeutic injection, initial encounter: Secondary | ICD-10-CM

## 2018-07-09 DIAGNOSIS — Z9189 Other specified personal risk factors, not elsewhere classified: Secondary | ICD-10-CM

## 2018-07-09 DIAGNOSIS — Z79899 Other long term (current) drug therapy: Secondary | ICD-10-CM | POA: Diagnosis not present

## 2018-07-09 DIAGNOSIS — I700 Atherosclerosis of aorta: Secondary | ICD-10-CM | POA: Diagnosis not present

## 2018-07-09 DIAGNOSIS — N400 Enlarged prostate without lower urinary tract symptoms: Secondary | ICD-10-CM | POA: Insufficient documentation

## 2018-07-09 DIAGNOSIS — R61 Generalized hyperhidrosis: Secondary | ICD-10-CM | POA: Insufficient documentation

## 2018-07-09 DIAGNOSIS — I2510 Atherosclerotic heart disease of native coronary artery without angina pectoris: Secondary | ICD-10-CM | POA: Diagnosis not present

## 2018-07-09 DIAGNOSIS — R161 Splenomegaly, not elsewhere classified: Secondary | ICD-10-CM | POA: Diagnosis not present

## 2018-07-09 DIAGNOSIS — R6889 Other general symptoms and signs: Secondary | ICD-10-CM

## 2018-07-09 DIAGNOSIS — J069 Acute upper respiratory infection, unspecified: Secondary | ICD-10-CM | POA: Diagnosis not present

## 2018-07-09 DIAGNOSIS — C9110 Chronic lymphocytic leukemia of B-cell type not having achieved remission: Secondary | ICD-10-CM

## 2018-07-09 DIAGNOSIS — Z85828 Personal history of other malignant neoplasm of skin: Secondary | ICD-10-CM | POA: Insufficient documentation

## 2018-07-09 DIAGNOSIS — R490 Dysphonia: Secondary | ICD-10-CM | POA: Diagnosis not present

## 2018-07-09 DIAGNOSIS — Z5112 Encounter for antineoplastic immunotherapy: Secondary | ICD-10-CM | POA: Insufficient documentation

## 2018-07-09 DIAGNOSIS — Z7982 Long term (current) use of aspirin: Secondary | ICD-10-CM | POA: Insufficient documentation

## 2018-07-09 DIAGNOSIS — R140 Abdominal distension (gaseous): Secondary | ICD-10-CM | POA: Insufficient documentation

## 2018-07-09 DIAGNOSIS — M47812 Spondylosis without myelopathy or radiculopathy, cervical region: Secondary | ICD-10-CM | POA: Insufficient documentation

## 2018-07-09 DIAGNOSIS — N529 Male erectile dysfunction, unspecified: Secondary | ICD-10-CM | POA: Diagnosis not present

## 2018-07-09 DIAGNOSIS — D702 Other drug-induced agranulocytosis: Secondary | ICD-10-CM

## 2018-07-09 DIAGNOSIS — Z7189 Other specified counseling: Secondary | ICD-10-CM

## 2018-07-09 LAB — CMP (CANCER CENTER ONLY)
ALT: 18 U/L (ref 0–44)
AST: 12 U/L — ABNORMAL LOW (ref 15–41)
Albumin: 3.4 g/dL — ABNORMAL LOW (ref 3.5–5.0)
Alkaline Phosphatase: 58 U/L (ref 38–126)
Anion gap: 14 (ref 5–15)
BUN: 28 mg/dL — ABNORMAL HIGH (ref 8–23)
CO2: 21 mmol/L — ABNORMAL LOW (ref 22–32)
Calcium: 9.1 mg/dL (ref 8.9–10.3)
Chloride: 102 mmol/L (ref 98–111)
Creatinine: 0.83 mg/dL (ref 0.61–1.24)
GFR, Est AFR Am: >60 mL/min (ref >60)
GFR, Estimated: >60 mL/min (ref >60)
Glucose, Bld: 111 mg/dL — ABNORMAL HIGH (ref 70–99)
Potassium: 3.8 mmol/L (ref 3.5–5.1)
Sodium: 137 mmol/L (ref 135–145)
Total Bilirubin: 1 mg/dL (ref 0.3–1.2)
Total Protein: 6.5 g/dL (ref 6.5–8.1)

## 2018-07-09 LAB — CBC WITH DIFFERENTIAL/PLATELET
Abs Immature Granulocytes: 0 10*3/uL (ref 0.00–0.07)
Band Neutrophils: 6 %
Basophils Absolute: 0 10*3/uL (ref 0.0–0.1)
Basophils Relative: 0 %
Eosinophils Absolute: 0 10*3/uL (ref 0.0–0.5)
Eosinophils Relative: 0 %
HCT: 39.2 % (ref 39.0–52.0)
Hemoglobin: 12.4 g/dL — ABNORMAL LOW (ref 13.0–17.0)
Lymphocytes Relative: 75 %
Lymphs Abs: 1.7 10*3/uL (ref 0.7–4.0)
MCH: 37.9 pg — ABNORMAL HIGH (ref 26.0–34.0)
MCHC: 31.6 g/dL (ref 30.0–36.0)
MCV: 119.9 fL — ABNORMAL HIGH (ref 80.0–100.0)
Monocytes Absolute: 0.1 10*3/uL (ref 0.1–1.0)
Monocytes Relative: 3 %
Neutro Abs: 0.5 10*3/uL — ABNORMAL LOW (ref 1.7–17.7)
Neutrophils Relative %: 16 %
Platelets: 96 10*3/uL — ABNORMAL LOW (ref 150–400)
RBC: 3.27 MIL/uL — ABNORMAL LOW (ref 4.22–5.81)
RDW: 14.7 % (ref 11.5–15.5)
WBC Morphology: ABNORMAL
WBC: 2.3 10*3/uL — ABNORMAL LOW (ref 4.0–10.5)
nRBC: 0 % (ref 0.0–0.2)

## 2018-07-09 LAB — LACTATE DEHYDROGENASE: LDH: 177 U/L (ref 98–192)

## 2018-07-09 LAB — URIC ACID: Uric Acid, Serum: 4.2 mg/dL (ref 3.7–8.6)

## 2018-07-09 LAB — PHOSPHORUS: Phosphorus: 3.2 mg/dL (ref 2.5–4.6)

## 2018-07-09 MED ORDER — FAMOTIDINE IN NACL 20-0.9 MG/50ML-% IV SOLN
20.0000 mg | Freq: Once | INTRAVENOUS | Status: AC
  Administered 2018-07-09: 20 mg via INTRAVENOUS

## 2018-07-09 MED ORDER — SODIUM CHLORIDE 0.9 % IV SOLN
20.0000 mg | Freq: Once | INTRAVENOUS | Status: AC
  Administered 2018-07-09: 20 mg via INTRAVENOUS
  Filled 2018-07-09: qty 20

## 2018-07-09 MED ORDER — DIPHENHYDRAMINE HCL 50 MG/ML IJ SOLN
50.0000 mg | Freq: Once | INTRAMUSCULAR | Status: AC
  Administered 2018-07-09: 50 mg via INTRAVENOUS

## 2018-07-09 MED ORDER — MEPERIDINE HCL 25 MG/ML IJ SOLN
INTRAMUSCULAR | Status: AC
  Filled 2018-07-09: qty 1

## 2018-07-09 MED ORDER — MEPERIDINE HCL 25 MG/ML IJ SOLN
25.0000 mg | Freq: Once | INTRAMUSCULAR | Status: AC
  Administered 2018-07-09: 25 mg via INTRAVENOUS

## 2018-07-09 MED ORDER — ACETAMINOPHEN 325 MG PO TABS
650.0000 mg | ORAL_TABLET | Freq: Once | ORAL | Status: AC
  Administered 2018-07-09: 650 mg via ORAL

## 2018-07-09 MED ORDER — DIPHENHYDRAMINE HCL 50 MG/ML IJ SOLN
INTRAMUSCULAR | Status: AC
  Filled 2018-07-09: qty 1

## 2018-07-09 MED ORDER — SODIUM CHLORIDE 0.9 % IV SOLN
Freq: Once | INTRAVENOUS | Status: AC
  Administered 2018-07-09: 09:00:00 via INTRAVENOUS
  Filled 2018-07-09: qty 250

## 2018-07-09 MED ORDER — FAMOTIDINE IN NACL 20-0.9 MG/50ML-% IV SOLN
INTRAVENOUS | Status: AC
  Filled 2018-07-09: qty 50

## 2018-07-09 MED ORDER — SODIUM CHLORIDE 0.9 % IV SOLN
100.0000 mg | Freq: Once | INTRAVENOUS | Status: AC
  Administered 2018-07-09: 100 mg via INTRAVENOUS
  Filled 2018-07-09: qty 4

## 2018-07-09 MED ORDER — ACETAMINOPHEN 325 MG PO TABS
ORAL_TABLET | ORAL | Status: AC
  Filled 2018-07-09: qty 2

## 2018-07-09 NOTE — Progress Notes (Signed)
Bedside report received from [facility].  Gazyva stopped in [facility] manually at estimated time of that saline flush had been begun.  PIV in RFA removed today at 1630 but not found in chart to be active for removal documentation.  Site C/D/I and catheter intact upon removal at end of treatment.  Pt ambulatory w/steady gait to exit with belongings, wife waiting outside to take him home.  Denies any further questions or concerns at this time.

## 2018-07-09 NOTE — Patient Instructions (Signed)
[facility] Discharge Instructions for Patients Receiving Chemotherapy  Today you received the following immunotherapy: Gayzva (Obinituzumab)  To help prevent nausea and vomiting after your treatment, we encourage you to take your medication as dorected. If you develop nausea and vomiting that is not controlled by your nausea medication, call the clinic.   BELOW ARE SYMPTOMS THAT SHOULD BE REPORTED IMMEDIATELY:  *FEVER GREATER THAN 100.5 F  *CHILLS WITH OR WITHOUT FEVER  NAUSEA AND VOMITING THAT IS NOT CONTROLLED WITH YOUR NAUSEA MEDICATION  *UNUSUAL SHORTNESS OF BREATH  *UNUSUAL BRUISING OR BLEEDING  TENDERNESS IN MOUTH AND THROAT WITH OR WITHOUT PRESENCE OF ULCERS  *URINARY PROBLEMS  *BOWEL PROBLEMS  UNUSUAL RASH Items with * indicate a potential emergency and should be followed up as soon as possible.  Feel free to call the clinic should you have any questions or concerns. The clinic phone number is [phone].  Please show the [facility] at check-in to the Emergency Department and triage nurse.  Obinutuzumab injection What is this medicine? OBINUTUZUMAB (OH bi nue TOOZ ue mab) is a monoclonal antibody. It is used to treat chronic lymphocytic leukemia (CLL) and a type of non-Hodgkin lymphoma (NHL), follicular lymphoma. This medicine may be used for other purposes; ask your health care provider or pharmacist if you have questions. COMMON BRAND NAME(S): GAZYVA What should I tell my health care provider before I take this medicine? They need to know if you have any of these conditions: -infection (especially a virus infection such as hepatitis B virus) -lung or breathing disease -heart disease -take medicines that treat or prevent blood clots -an unusual or allergic reaction to obinutuzumab, other medicines, foods, dyes, or preservatives -pregnant or trying to get pregnant -breast-feeding How should I use this medicine? This medicine is for  infusion into a vein. It is given by a health care professional in a hospital or clinic setting. Talk to your pediatrician regarding the use of this medicine in children. Special care may be needed. Overdosage: If you think you have taken too much of this medicine contact a poison control center or emergency room at once. NOTE: This medicine is only for you. Do not share this medicine with others. What if I miss a dose? Keep appointments for follow-up doses as directed. It is important not to miss your dose. Call your doctor or health care professional if you are unable to keep an appointment. What may interact with this medicine? -live virus vaccines This list may not describe all possible interactions. Give your health care provider a list of all the medicines, herbs, non-prescription drugs, or dietary supplements you use. Also tell them if you smoke, drink alcohol, or use illegal drugs. Some items may interact with your medicine. What should I watch for while using this medicine? Report any side effects that you notice during your treatment right away, such as changes in your breathing, fever, chills, dizziness or lightheadedness. These effects are more common with the first dose. Visit your prescriber or health care professional for checks on your progress. You will need to have regular blood work. Report any other side effects. The side effects of this medicine can continue after you finish your treatment. Continue your course of treatment even though you feel ill unless your doctor tells you to stop. Call your doctor or health care professional for advice if you get a fever, chills or sore throat, or other symptoms of a cold or flu. Do not treat yourself. This  drug decreases your body's ability to fight infections. Try to avoid being around people who are sick. This medicine may increase your risk to bruise or bleed. Call your doctor or health care professional if you notice any unusual  bleeding. What side effects may I notice from receiving this medicine? Side effects that you should report to your doctor or health care professional as soon as possible: -allergic reactions like skin rash, itching or hives, swelling of the face, lips, or tongue -breathing problems -changes in vision -chest pain or chest tightness -confusion -dizziness -loss of balance or coordination -low blood counts - this medicine may decrease the number of white blood cells, red blood cells and platelets. You may be at increased risk for infections and bleeding. -signs of decreased platelets or bleeding - bruising, pinpoint red spots on the skin, black, tarry stools, blood in the urine -signs of infection - fever or chills, cough, sore throat, pain or trouble passing urine -signs and symptoms of liver injury like dark yellow or brown urine; general ill feeling or flu-like symptoms; light-colored stools; loss of appetite; nausea; right upper belly pain; unusually weak or tired; yellowing of the eyes or skin -trouble speaking or understanding -trouble walking -vomiting Side effects that usually do not require medical attention (report to your doctor or health care professional if they continue or are bothersome): -constipation -joint pain -muscle pain This list may not describe all possible side effects. Call your doctor for medical advice about side effects. You may report side effects to FDA at 1-800-FDA-1088. Where should I keep my medicine? This drug is only given in a hospital or clinic and will not be stored at home. NOTE: This sheet is a summary. It may not cover all possible information. If you have questions about this medicine, talk to your doctor, pharmacist, or health care provider.  2019 Elsevier/Gold Standard (2015-02-23 08:54:03)

## 2018-07-09 NOTE — Progress Notes (Signed)
HEMATOLOGY/ONCOLOGY CLINIC NOTE  Date of Service: 07/10/2018  Patient Care Team: [name], MD as PCP - General (Internal Medicine) Sherren Mocha, MD as PCP - Cardiology (Cardiology)  CHIEF COMPLAINTS/PURPOSE OF CONSULTATION:  Chronic Lymphocytic Leukemia  Oncologic History:     Chronic lymphocytic leukemia (CLL), B-cell ([place])   02/14/2015 Initial Diagnosis    Monoclonal B-cell population CD5 and CD20 positive, differential diagnosis CLL versus mantle cell lymphoma ( patient had absolute lymphocyte count of 8000 in 2010), remained stable.    07/09/2018 -  Chemotherapy    The patient had obinutuzumab (GAZYVA) 100 mg in sodium chloride 0.9 % 100 mL (0.9615 mg/mL) chemo infusion, 100 mg, Intravenous, Once, 1 of 6 cycles Administration: 100 mg (07/09/2018)  for chemotherapy treatment.       HISTORY OF PRESENTING ILLNESS:  Jake Kennedy is a wonderful 68 y.o. male who has been referred to [place] by Dr. [name] for evaluation and management of Chronic Lymphocytic Leukemia. The pt was formerly under the care of my colleague Dr. Nicholas Lose. He is accompanied today by his wife. The pt reports that he is doing well overall.  The pt reports that his course of CLL had been pretty consistent since diagnosis in 2017, until roughly 3 months ago in roughly November 2019. He notes that he has had recent increase in fatigue in the last 3 months, and more acutely in the last 3 weeks. He endorses occasional fevers over the last two weeks during the night, highest at 100.2 or 100.3. He also endorses some night sweats, not every night, some of these being "drenching." He notes that he has been eating well for the last 6 months, however has lost 8 pounds in that interim. He endorses regular exercising until the past month, due to his worsened fatigue, and had previously regularly run upwards of 3 miles and bike for several miles as well.  The pt notes that he can't smell very well, in the last 2-3  weeks, and his hearing has also reduced in the last week. He senses that his enlarged lymph nodes are pressing on his neck, and characterizes this as similar to when one has an acute cold and congestion. He had acute pain in his right hip a week ago and saw my colleague Sandi Mealy, PA in Symptom Managment. He was given Norco but denies taking this because he didn't want to. He endorses a positional element to his hip pain, which e attributes to the pain being from an enlarged lymph node. He notes that his hip pain had sudden onset on the night of 03/18/18. He notes that his neck lymph nodes have grown in the last 3 weeks to a month. The pt notes that his lymph nodes in his neck, armpits, and groin have been enlarged.  The pt tried an antibiotic course 2 weeks ago with his PCP for a suspected sinus infection, during which his cervical lymph nodes enlarged further. He notes that his neck lymph nodes are somewhat improved today as compared to this. The pt denies frequent infections.   Of note prior to the patient's visit today, pt has had CT N/C/A/P completed on 03/19/18 with results revealing NECK: Multiple enlarged bilateral cervical lymph nodes consistent with the given diagnosis of CLL. The largest node is a right level 3 lymph node measuring 3.4 x 2.8 x 1.2 cm. 2. Degenerative changes of the cervical spine. 3. Hypodense lesion in the right side of the tongue measuring up to  1.5 cm. This could represent focal infection. Neoplasm is considered less likely. The lesion appears to be superficial along the right lateral aspect of the anterior tongue a. This should be amenable to direct inspection, C/A/P: Marked adenopathy within the chest, abdomen, and pelvis, consistent with active lymphoma/leukemia. 2. Splenomegaly is nonspecific. Splenic involvement cannot be excluded. 3. Right greater than left and basilar predominant peribronchovascular interstitial thickening and nodularity. Considerations include pulmonary  involvement of lymphoma and/or Infection/aspiration. 4. Coronary artery atherosclerosis. Aortic Atherosclerosis. 5. Trace nonspecific pelvic fluid.  Most recent lab results (03/17/18) of CBC w/diff and CMP is as follows: all values are WNL except for WBC at 13.1k, RBC at 2.67, HGB at 10.1, HCT at 31.0, MCV at 116.1, MCH at 37.8, PLT at 103k, ANC at 900, Lymphs abs at 11.1k, BUN at 31, Calcium at 8.7, Total Protein at 6.2, Albumin at 3.4. 03/17/18 LDH at 241  On review of systems, pt reports new fatigue, recent fevers, recent night sweats, recently grown neck lymph nodes, eating well, some weight loss, right groin pain, and denies chills, SOB, ear pain, pain along the spine, frequent infections, flank pain, leg swelling, testicular pain or swelling, and any other symptoms.  Interval History:   Jake Kennedy returns today for management and evaluation of his CLL. The patient's last visit with [place] was on 06/30/18. The pt reports that he is doing well overall.  The pt reports that he has continued to use his stationary bike at home but does note that he is feeling a little lower energy levels. He feels that he has had more recent hoarseness in his throat and a stuffy nose, mostly in the morning and improves throughout the day. He is using his A/C at night and also denies any fevers or chills. He denies sinus pressure or pain.   The pt had C1D1 Gazyva infusion yesterday and developed some congestion in one nostril and coughing, and then shakes for 15-20 minutes. He had Demerol intervention and was able to resume his Gazyva infusion with displayed tolerance.  Lab results (07/09/18) of CBC w/diff and CMP is as follows: all values are WNL except for WBC at 2.3k, RBC at 3.27, HGB at 12.4, MCV at 119.9, MCH at 37.9, PLT at 96k, ANC at 500, CO2 at 21, Glucose at 111, BUN at 28, Albumin at 3.4, AST at 12.  On review of systems, pt reports morning congestion, somewhat lower energy levels, staying active, eating well,  weight gain, and denies fevers, chills, concerns for infections, sinus pressure or pain, leg swelling, abdominal pains, and any other symptoms.    MEDICAL HISTORY:  Past Medical History:  Diagnosis Date   Basal cell carcinoma    moles removed in 2007 and 2012 from L nose and L hip   BPH (benign prostatic hypertrophy)    CAD (coronary artery disease), native coronary artery    a. 02/10/2015 95% mid LAD stenosis s/p DES  b. 07/15/16: LHC showed stable non obst dz and patent stent   CLL (chronic lymphocytic leukemia) ([place])    ED (erectile dysfunction)    Lymphocytosis 02/11/2015   seen by Dr. Lindi Adie 02/11/2015, working up for possible CLL    SURGICAL HISTORY: Past Surgical History:  Procedure Laterality Date   CARDIAC CATHETERIZATION  02/10/2015   CARDIAC CATHETERIZATION N/A 02/10/2015   Procedure: Left Heart Cath and Coronary Angiography;  Surgeon: Sherren Mocha, MD;  Location: [facility];  Service: Cardiovascular;  Laterality: N/A;   CARDIAC CATHETERIZATION  N/A 02/10/2015   Procedure: Coronary Stent Intervention;  Surgeon: Sherren Mocha, MD;  Location: [facility];  Service: Cardiovascular;  Laterality: N/A;   CORONARY STENT PLACEMENT  02/10/2015   DES to LAD   LAMINECTOMY     LEFT HEART CATH AND CORONARY ANGIOGRAPHY N/A 07/15/2016   Procedure: Left Heart Cath and Coronary Angiography;  Surgeon: Martinique, Peter M, MD;  Location: [facility];  Service: Cardiovascular;  Laterality: N/A;   TONSILLECTOMY      SOCIAL HISTORY: Social History   Socioeconomic History   Marital status: Married    Spouse name: Not on file   Number of children: Not on file   Years of education: Not on file   Highest education level: Not on file  Occupational History   Not on file  Social Needs   Financial resource strain: Not on file   Food insecurity:    Worry: Not on file    Inability: Not on file   Transportation needs:    Medical: Not on file    Non-medical:  Not on file  Tobacco Use   Smoking status: Never Smoker   Smokeless tobacco: Never Used  Substance and Sexual Activity   Alcohol use: Yes    Alcohol/week: 0.0 standard drinks    Comment: occasional   Drug use: No   Sexual activity: Not on file  Lifestyle   Physical activity:    Days per week: Not on file    Minutes per session: Not on file   Stress: Not on file  Relationships   Social connections:    Talks on phone: Not on file    Gets together: Not on file    Attends religious service: Not on file    Active member of club or organization: Not on file    Attends meetings of clubs or organizations: Not on file    Relationship status: Not on file   Intimate partner violence:    Fear of current or ex partner: Not on file    Emotionally abused: Not on file    Physically abused: Not on file    Forced sexual activity: Not on file  Other Topics Concern   Not on file  Social History Narrative   Not on file    FAMILY HISTORY: Family History  Problem Relation Age of Onset   Diabetes Mother    Heart failure Mother    Heart attack Mother        occured in 68s.    Healthy Sister    Healthy Brother    Healthy Maternal Grandmother    Healthy Maternal Grandfather    Healthy Paternal Grandmother    Healthy Paternal Grandfather    Healthy Brother     ALLERGIES:  is allergic to adhesive [tape] and latex.  MEDICATIONS:  Current Outpatient Medications  Medication Sig Dispense Refill   aspirin 81 MG tablet Take 81 mg by mouth daily.     atorvastatin (LIPITOR) 80 MG tablet Take 1 tablet (80 mg total) by mouth daily at 6 PM. 90 tablet 3   dexamethasone (DECADRON) 1 MG tablet Take 2 tablets (2 mg total) by mouth daily for 7 days, THEN 1 tablet (1 mg total) daily for 7 days, THEN 1 tablet (1 mg total) every other day for 14 days. TAKE 30 TO 45 MINUTES PRIOR TO VENCLEXTA WITH ZOFRAN AND ACETAMINOPHEN. 28 tablet 0   Multiple Vitamin (MULTIVITAMIN) tablet Take 1  tablet by mouth daily.  Multiple Vitamins-Minerals (PRESERVISION AREDS PO) Take 2 capsules by mouth daily.     nitroGLYCERIN (NITROSTAT) 0.4 MG SL tablet Place 1 tablet (0.4 mg total) under the tongue every 5 (five) minutes as needed for chest pain. 25 tablet 3   Omega-3 Fatty Acids (FISH OIL CONCENTRATE PO) Take 1 capsule by mouth daily.      ondansetron (ZOFRAN) 8 MG tablet Take 1 tablet (8 mg total) by mouth every 8 (eight) hours as needed for nausea. 30 tablet 3   vardenafil (LEVITRA) 10 MG tablet Take 10 mg by mouth as directed.     venetoclax (VENCLEXTA) 100 MG TABS Take 400 mg by mouth daily. Take with a meal and a glass of water at approximately the same time daily. 120 tablet 1   No current facility-administered medications for this visit.    Facility-Administered Medications Ordered in Other Visits  Medication Dose Route Frequency Provider Last Rate Last Dose   0.9 %  sodium chloride infusion   Intravenous Once Brunetta Genera, MD       acetaminophen (TYLENOL) tablet 650 mg  650 mg Oral Once Brunetta Genera, MD       acetaminophen (TYLENOL) tablet 650 mg  650 mg Oral Once Brunetta Genera, MD       dexamethasone (DECADRON) 20 mg in sodium chloride 0.9 % 50 mL IVPB  20 mg Intravenous Once Brunetta Genera, MD       famotidine (PEPCID) IVPB 20 mg premix  20 mg Intravenous Once Brunetta Genera, MD       meperidine (DEMEROL) injection 25 mg  25 mg Intravenous Once Brunetta Genera, MD       obinutuzumab (GAZYVA) 900 mg in sodium chloride 0.9 % 250 mL (3.1469 mg/mL) chemo infusion  900 mg Intravenous Once Brunetta Genera, MD        REVIEW OF SYSTEMS:    A 10+ POINT REVIEW OF SYSTEMS WAS OBTAINED including neurology, dermatology, psychiatry, cardiac, respiratory, lymph, extremities, GI, GU, Musculoskeletal, constitutional, breasts, reproductive, HEENT.  All pertinent positives are noted in the HPI.  All others are negative.   PHYSICAL  EXAMINATION: ECOG PERFORMANCE STATUS: 1 - Symptomatic but completely ambulatory  Vitals:   07/10/18 0849  BP: 127/66  Pulse: 100  Resp: 18  Temp: 98.3 F (36.8 C)  SpO2: 97%   Filed Weights   07/10/18 0849  Weight: 165 lb 11.2 oz (75.2 kg)   .Body mass index is 21.27 kg/m.   GENERAL:alert, in no acute distress and comfortable SKIN: no acute rashes, no significant lesions EYES: conjunctiva are pink and non-injected, sclera anicteric OROPHARYNX: MMM, no exudates, no oropharyngeal erythema or ulceration NECK: supple, no JVD LYMPH:  no palpable lymphadenopathy in the cervical, axillary or inguinal regions LUNGS: clear to auscultation b/l with normal respiratory effort HEART: regular rate & rhythm ABDOMEN:  normoactive bowel sounds , non tender, not distended. No palpable hepatosplenomegaly.  Extremity: no pedal edema PSYCH: alert & oriented x 3 with fluent speech NEURO: no focal motor/sensory deficits   LABORATORY DATA:  I have reviewed the data as listed  . CBC Latest Ref Rng & Units 07/09/2018 06/30/2018 06/16/2018  WBC 4.0 - 10.5 K/uL 2.3(L) 3.4(L) 7.8  Hemoglobin 13.0 - 17.0 g/dL 12.4(L) 11.2(L) 10.9(L)  Hematocrit 39.0 - 52.0 % 39.2 35.3(L) 34.5(L)  Platelets 150 - 400 K/uL 96(L) 135(L) 66(L)  ANC 700<---600<---1200  . CMP Latest Ref Rng & Units 07/09/2018 06/30/2018 06/16/2018  Glucose 70 - 99  mg/dL 111(H) 108(H) 151(H)  BUN 8 - 23 mg/dL 28(H) 30(H) 30(H)  Creatinine 0.61 - 1.24 mg/dL 0.83 0.79 0.91  Sodium 135 - 145 mmol/L 137 137 136  Potassium 3.5 - 5.1 mmol/L 3.8 5.0 4.1  Chloride 98 - 111 mmol/L 102 103 100  CO2 22 - 32 mmol/L 21(L) 25 28  Calcium 8.9 - 10.3 mg/dL 9.1 8.4(L) 8.6(L)  Total Protein 6.5 - 8.1 g/dL 6.5 5.9(L) 6.3(L)  Total Bilirubin 0.3 - 1.2 mg/dL 1.0 1.0 1.2  Alkaline Phos 38 - 126 U/L 58 46 56  AST 15 - 41 U/L 12(L) 13(L) 15  ALT 0 - 44 U/L 18 27 36    04/30/18 Outside Labs:                RADIOGRAPHIC STUDIES: I have  personally reviewed the radiological images as listed and agreed with the findings in the report. No results found.  ASSESSMENT & PLAN:  68 y.o. male with  1. Chronic Lymphocytic Leukemia Initial diagnosis on 02/13/15 Peripheral blood flow cytometry which revealed a Monoclonal B-cell population 09/25/15 Cytogenetics ruled out Mantle cell lymphoma. 12/25/17 FISH study ruled out translocation 11;14 12/30/17 BM Biopsy revealed CLL without evidence of transformation  03/17/18 FISH CLL Prognostic panel revealed an 11q deletion and Trisomy 12  03/19/18 CT N/C/A/P revealed NECK: Multiple enlarged bilateral cervical lymph nodes consistent with the given diagnosis of CLL. The largest node is a right level 3 lymph node measuring 3.4 x 2.8 x 1.2 cm. 2. Degenerative changes of the cervical spine. 3. Hypodense lesion in the right side of the tongue measuring up to 1.5 cm. This could represent focal infection. Neoplasm is considered less likely. The lesion appears to be superficial along the right lateral aspect of the anterior tongue a. This should be amenable to direct inspection, C/A/P: Marked adenopathy within the chest, abdomen, and pelvis, consistent with active lymphoma/leukemia. 2. Splenomegaly is nonspecific. Splenic involvement cannot be excluded. 3. Right greater than left and basilar predominant peribronchovascular interstitial thickening and nodularity. Considerations include pulmonary involvement of lymphoma and/or Infection/aspiration. 4. Coronary artery atherosclerosis. Aortic Atherosclerosis. 5. Trace nonspecific pelvic fluid.  03/26/18 Left cervical LN biopsy revealed SLL/CLL  PLAN: -Discussed pt labwork from, 07/09/18; neutropenic with ANC at 500. PLT lower at 96k. HGB continues to improve, now to 12.4. -Will hold 200mg  Venetoclax for the next week given neutropenia. Will check labs again in one week. -Advised strict neutropenic precautions and presenting to the ED for fevers of 100.4 or  greater -The pt has no prohibitive toxicities from continuing [place], at this time. No rapid protocol. -Discussed that I will add Demerol to his pre-medications prior to Gazyva infusion. Also two days of 4mg  Dexamethasone BID prior to subsequent infusions. -Will now taper to 1mg  Dexamethasone for one week, then 1mg  every other day for one week, then stop. Tapering only if pt does not develop fevers. -Pt is staying well hydrated, no indication for IVF at this time -Recommended that the pt continue to eat well, drink at least 48-64 oz of water each day, and walk 20-30 minutes each day. -Advised crowd avoidance, infection prevention strategies, and frequent hand washing. -Will see the pt back in one week   plz add MD visit for 6/12 (can see in infusion if needed)   All of the patients questions were answered with apparent satisfaction. The patient knows to call the clinic with any problems, questions or concerns.  The total time spent in the appt was  25 minutes and more than 50% was on counseling and direct patient cares.    Sullivan Lone MD MS AAHIVMS [facility] Hematology/Oncology Physician [facility]  (Office):       [phone] (Work cell):  [phone] (Fax):           [phone]  07/10/2018 9:53 AM  I, Baldwin Jamaica, am acting as a scribe for Dr. Sullivan Lone.   .I have reviewed the above documentation for accuracy and completeness, and I agree with the above. Brunetta Genera MD

## 2018-07-09 NOTE — Progress Notes (Signed)
     DATE:  07/08/2012                                         X  CHEMO/IMMUNOTHERAPY REACTION             MD:  Dr. Sullivan Lone   AGENT/BLOOD [place]:               Dyann Kief   AGENT/BLOOD PRODUCT RECEIVING IMMEDIATELY PRIOR TO REACTION:           Gazyva   VS: BP:      132/90     SPO2:        98% on room air                   REACTION(S):           Sinus congestion, congestion of the throat, voice changes, and rigors   PREMEDS:      Tylenol 650 mg p.o. x1, Benadryl 50 mg IV x1, dexamethasone 20 mg IV x1, and Pepcid 20 mg IV x1   INTERVENTION: Demerol 25 mg IV x1   Review of Systems  Review of Systems  Constitutional: Negative for chills, diaphoresis and fever.  HENT: Positive for congestion and voice change. Negative for trouble swallowing.   Respiratory: Negative for cough, chest tightness, shortness of breath and wheezing.   Cardiovascular: Negative for chest pain and palpitations.  Gastrointestinal: Negative for abdominal pain, constipation, diarrhea, nausea and vomiting.  Musculoskeletal: Negative for back pain and myalgias.  Neurological: Positive for tremors. Negative for dizziness, light-headedness and headaches.     Physical Exam  Physical Exam Constitutional:      General: He is not in acute distress.    Appearance: He is not diaphoretic.     Comments: The patient is an adult male who is having rigors.  HENT:     Head: Normocephalic and atraumatic.  Cardiovascular:     Rate and Rhythm: Normal rate and regular rhythm.     Heart sounds: Normal heart sounds. No murmur. No friction rub. No gallop.   Pulmonary:     Effort: Pulmonary effort is normal. No respiratory distress.     Breath sounds: Normal breath sounds. No wheezing or rales.  Skin:    General: Skin is warm and dry.     Findings: No erythema or rash.  Neurological:     Mental Status: He is alert.     OUTCOME:                 Rigors resolved after dosing with Demerol 25 mg IV x1.  This  case was discussed with Dr. Jenell Milliner.  He plans to add Demerol to the patient's premeds.  The patient was able to restart and complete the remainder of his Gazyva infusion.  He will return tomorrow for another treatment.  This case was discussed with Dr. Irene Limbo. He expresses agreement with my management of this patient.   Sandi Mealy, MHS, PA-C

## 2018-07-09 NOTE — Progress Notes (Signed)
Per Dr. Ocie Bob to treat with today's CBC values  1211 Pt c/o new onset nasal congestion. vss Sandi Mealy PA-C notified. Gazyva stopped and NSS started wide open  1218 Pt c/o of rigors. Sandi Mealy PA-C examining patient  1223 Demerol given  2256 Rigors resolved  7209 Gazyva restarted at same rate, 25mg  per hour.

## 2018-07-09 NOTE — Progress Notes (Signed)
Ok to treat with Hep B serology lab results drawn but not resulted per MD.  Hardie Pulley, PharmD, BCPS, BCOP

## 2018-07-10 ENCOUNTER — Telehealth: Payer: Self-pay | Admitting: Hematology

## 2018-07-10 ENCOUNTER — Other Ambulatory Visit: Payer: Self-pay

## 2018-07-10 ENCOUNTER — Inpatient Hospital Stay (HOSPITAL_BASED_OUTPATIENT_CLINIC_OR_DEPARTMENT_OTHER): Payer: BC Managed Care – PPO | Admitting: Hematology

## 2018-07-10 ENCOUNTER — Inpatient Hospital Stay: Payer: BC Managed Care – PPO

## 2018-07-10 VITALS — BP 101/70 | HR 74 | Temp 97.8°F | Resp 16

## 2018-07-10 VITALS — BP 127/66 | HR 100 | Temp 98.3°F | Resp 18 | Ht 74.0 in | Wt 165.7 lb

## 2018-07-10 DIAGNOSIS — R490 Dysphonia: Secondary | ICD-10-CM | POA: Diagnosis not present

## 2018-07-10 DIAGNOSIS — Z7189 Other specified counseling: Secondary | ICD-10-CM

## 2018-07-10 DIAGNOSIS — Z79899 Other long term (current) drug therapy: Secondary | ICD-10-CM | POA: Diagnosis not present

## 2018-07-10 DIAGNOSIS — R61 Generalized hyperhidrosis: Secondary | ICD-10-CM | POA: Diagnosis not present

## 2018-07-10 DIAGNOSIS — M47812 Spondylosis without myelopathy or radiculopathy, cervical region: Secondary | ICD-10-CM

## 2018-07-10 DIAGNOSIS — N529 Male erectile dysfunction, unspecified: Secondary | ICD-10-CM | POA: Diagnosis not present

## 2018-07-10 DIAGNOSIS — C9110 Chronic lymphocytic leukemia of B-cell type not having achieved remission: Secondary | ICD-10-CM | POA: Diagnosis not present

## 2018-07-10 DIAGNOSIS — N400 Enlarged prostate without lower urinary tract symptoms: Secondary | ICD-10-CM

## 2018-07-10 DIAGNOSIS — Z85828 Personal history of other malignant neoplasm of skin: Secondary | ICD-10-CM

## 2018-07-10 DIAGNOSIS — J069 Acute upper respiratory infection, unspecified: Secondary | ICD-10-CM | POA: Diagnosis not present

## 2018-07-10 DIAGNOSIS — Z7982 Long term (current) use of aspirin: Secondary | ICD-10-CM

## 2018-07-10 DIAGNOSIS — R590 Localized enlarged lymph nodes: Secondary | ICD-10-CM | POA: Diagnosis not present

## 2018-07-10 DIAGNOSIS — Z9189 Other specified personal risk factors, not elsewhere classified: Secondary | ICD-10-CM

## 2018-07-10 DIAGNOSIS — I700 Atherosclerosis of aorta: Secondary | ICD-10-CM

## 2018-07-10 DIAGNOSIS — R161 Splenomegaly, not elsewhere classified: Secondary | ICD-10-CM | POA: Diagnosis not present

## 2018-07-10 DIAGNOSIS — D702 Other drug-induced agranulocytosis: Secondary | ICD-10-CM

## 2018-07-10 DIAGNOSIS — R140 Abdominal distension (gaseous): Secondary | ICD-10-CM | POA: Diagnosis not present

## 2018-07-10 DIAGNOSIS — Z5112 Encounter for antineoplastic immunotherapy: Secondary | ICD-10-CM | POA: Diagnosis not present

## 2018-07-10 DIAGNOSIS — I2510 Atherosclerotic heart disease of native coronary artery without angina pectoris: Secondary | ICD-10-CM | POA: Diagnosis not present

## 2018-07-10 LAB — HEPATITIS B SURFACE ANTIGEN: Hepatitis B Surface Ag: NEGATIVE

## 2018-07-10 LAB — HEPATITIS B CORE ANTIBODY, TOTAL: Hep B Core Total Ab: NEGATIVE

## 2018-07-10 LAB — HEPATITIS C ANTIBODY: HCV Ab: <0.1 s/co ratio (ref 0.0–0.9)

## 2018-07-10 MED ORDER — ACETAMINOPHEN 325 MG PO TABS
ORAL_TABLET | ORAL | Status: AC
  Filled 2018-07-10: qty 2

## 2018-07-10 MED ORDER — MEPERIDINE HCL 25 MG/ML IJ SOLN
25.0000 mg | Freq: Once | INTRAMUSCULAR | Status: AC
  Administered 2018-07-10: 25 mg via INTRAVENOUS

## 2018-07-10 MED ORDER — ACETAMINOPHEN 325 MG PO TABS
650.0000 mg | ORAL_TABLET | Freq: Once | ORAL | Status: AC
  Administered 2018-07-10: 650 mg via ORAL

## 2018-07-10 MED ORDER — FAMOTIDINE IN NACL 20-0.9 MG/50ML-% IV SOLN
20.0000 mg | Freq: Once | INTRAVENOUS | Status: AC
  Administered 2018-07-10: 20 mg via INTRAVENOUS

## 2018-07-10 MED ORDER — MEPERIDINE HCL 25 MG/ML IJ SOLN
INTRAMUSCULAR | Status: AC
  Filled 2018-07-10: qty 1

## 2018-07-10 MED ORDER — DIPHENHYDRAMINE HCL 50 MG/ML IJ SOLN
50.0000 mg | Freq: Once | INTRAMUSCULAR | Status: AC
  Administered 2018-07-10: 50 mg via INTRAVENOUS

## 2018-07-10 MED ORDER — ACETAMINOPHEN 325 MG PO TABS
ORAL_TABLET | ORAL | Status: AC
  Filled 2018-07-10: qty 1

## 2018-07-10 MED ORDER — DIPHENHYDRAMINE HCL 50 MG/ML IJ SOLN
INTRAMUSCULAR | Status: AC
  Filled 2018-07-10: qty 1

## 2018-07-10 MED ORDER — SODIUM CHLORIDE 0.9 % IV SOLN
Freq: Once | INTRAVENOUS | Status: AC
  Administered 2018-07-10: 10:00:00 via INTRAVENOUS
  Filled 2018-07-10: qty 250

## 2018-07-10 MED ORDER — ACETAMINOPHEN 325 MG PO TABS
650.0000 mg | ORAL_TABLET | Freq: Once | ORAL | Status: AC
  Administered 2018-07-10: 14:00:00 650 mg via ORAL

## 2018-07-10 MED ORDER — SODIUM CHLORIDE 0.9 % IV SOLN
20.0000 mg | Freq: Once | INTRAVENOUS | Status: AC
  Administered 2018-07-10: 20 mg via INTRAVENOUS
  Filled 2018-07-10: qty 20

## 2018-07-10 MED ORDER — SODIUM CHLORIDE 0.9 % IV SOLN
900.0000 mg | Freq: Once | INTRAVENOUS | Status: AC
  Administered 2018-07-10: 900 mg via INTRAVENOUS
  Filled 2018-07-10: qty 36

## 2018-07-10 MED ORDER — DIPHENHYDRAMINE HCL 25 MG PO CAPS
ORAL_CAPSULE | ORAL | Status: AC
  Filled 2018-07-10: qty 1

## 2018-07-10 MED ORDER — DIPHENHYDRAMINE HCL 25 MG PO TABS
25.0000 mg | ORAL_TABLET | Freq: Once | ORAL | Status: AC
  Administered 2018-07-10: 25 mg via ORAL
  Filled 2018-07-10: qty 1

## 2018-07-10 MED ORDER — FAMOTIDINE IN NACL 20-0.9 MG/50ML-% IV SOLN
INTRAVENOUS | Status: AC
  Filled 2018-07-10: qty 50

## 2018-07-10 NOTE — Telephone Encounter (Signed)
Added MD visit per 6/5 los

## 2018-07-10 NOTE — Patient Instructions (Signed)
[facility] Discharge Instructions for Patients Receiving Chemotherapy  Today you received the following immunotherapy: Gayzva (Obinituzumab)  To help prevent nausea and vomiting after your treatment, we encourage you to take your medication as dorected. If you develop nausea and vomiting that is not controlled by your nausea medication, call the clinic.   BELOW ARE SYMPTOMS THAT SHOULD BE REPORTED IMMEDIATELY:  *FEVER GREATER THAN 100.5 F  *CHILLS WITH OR WITHOUT FEVER  NAUSEA AND VOMITING THAT IS NOT CONTROLLED WITH YOUR NAUSEA MEDICATION  *UNUSUAL SHORTNESS OF BREATH  *UNUSUAL BRUISING OR BLEEDING  TENDERNESS IN MOUTH AND THROAT WITH OR WITHOUT PRESENCE OF ULCERS  *URINARY PROBLEMS  *BOWEL PROBLEMS  UNUSUAL RASH Items with * indicate a potential emergency and should be followed up as soon as possible.  Feel free to call the clinic should you have any questions or concerns. The clinic phone number is [phone].  Please show the [place] at check-in to the Emergency Department and triage nurse.

## 2018-07-12 MED ORDER — DEXAMETHASONE 1 MG PO TABS: ORAL_TABLET | ORAL | 0 refills | Status: DC

## 2018-07-14 ENCOUNTER — Encounter: Payer: Self-pay | Admitting: Hematology

## 2018-07-16 ENCOUNTER — Encounter: Payer: Self-pay | Admitting: Hematology

## 2018-07-16 NOTE — Progress Notes (Signed)
HEMATOLOGY/ONCOLOGY CLINIC NOTE  Date of Service: 07/17/2018  Patient Care Team: [name], MD as PCP - General (Internal Medicine) Sherren Mocha, MD as PCP - Cardiology (Cardiology)  CHIEF COMPLAINTS/PURPOSE OF CONSULTATION:  Chronic Lymphocytic Leukemia  Oncologic History:   Oncology History  Chronic lymphocytic leukemia (CLL), B-cell ([place])  02/14/2015 Initial Diagnosis   Monoclonal B-cell population CD5 and CD20 positive, differential diagnosis CLL versus mantle cell lymphoma ( patient had absolute lymphocyte count of 8000 in 2010), remained stable.   07/09/2018 -  Chemotherapy   The patient had obinutuzumab (GAZYVA) 100 mg in sodium chloride 0.9 % 100 mL (0.9615 mg/mL) chemo infusion, 100 mg, Intravenous, Once, 1 of 6 cycles Administration: 100 mg (07/09/2018), 900 mg (07/10/2018)  for chemotherapy treatment.       HISTORY OF PRESENTING ILLNESS:  Jake Kennedy is a wonderful 68 y.o. male who has been referred to [place] by Dr. [name] for evaluation and management of Chronic Lymphocytic Leukemia. The pt was formerly under the care of my colleague Dr. Nicholas Lose. He is accompanied today by his wife. The pt reports that he is doing well overall.  The pt reports that his course of CLL had been pretty consistent since diagnosis in 2017, until roughly 3 months ago in roughly November 2019. He notes that he has had recent increase in fatigue in the last 3 months, and more acutely in the last 3 weeks. He endorses occasional fevers over the last two weeks during the night, highest at 100.2 or 100.3. He also endorses some night sweats, not every night, some of these being "drenching." He notes that he has been eating well for the last 6 months, however has lost 8 pounds in that interim. He endorses regular exercising until the past month, due to his worsened fatigue, and had previously regularly run upwards of 3 miles and bike for several miles as well.  The pt notes that he can't  smell very well, in the last 2-3 weeks, and his hearing has also reduced in the last week. He senses that his enlarged lymph nodes are pressing on his neck, and characterizes this as similar to when one has an acute cold and congestion. He had acute pain in his right hip a week ago and saw my colleague Sandi Mealy, PA in Symptom Managment. He was given Norco but denies taking this because he didn't want to. He endorses a positional element to his hip pain, which e attributes to the pain being from an enlarged lymph node. He notes that his hip pain had sudden onset on the night of 03/18/18. He notes that his neck lymph nodes have grown in the last 3 weeks to a month. The pt notes that his lymph nodes in his neck, armpits, and groin have been enlarged.  The pt tried an antibiotic course 2 weeks ago with his PCP for a suspected sinus infection, during which his cervical lymph nodes enlarged further. He notes that his neck lymph nodes are somewhat improved today as compared to this. The pt denies frequent infections.   Of note prior to the patient's visit today, pt has had CT N/C/A/P completed on 03/19/18 with results revealing NECK: Multiple enlarged bilateral cervical lymph nodes consistent with the given diagnosis of CLL. The largest node is a right level 3 lymph node measuring 3.4 x 2.8 x 1.2 cm. 2. Degenerative changes of the cervical spine. 3. Hypodense lesion in the right side of the tongue measuring up to  1.5 cm. This could represent focal infection. Neoplasm is considered less likely. The lesion appears to be superficial along the right lateral aspect of the anterior tongue a. This should be amenable to direct inspection, C/A/P: Marked adenopathy within the chest, abdomen, and pelvis, consistent with active lymphoma/leukemia. 2. Splenomegaly is nonspecific. Splenic involvement cannot be excluded. 3. Right greater than left and basilar predominant peribronchovascular interstitial thickening and nodularity.  Considerations include pulmonary involvement of lymphoma and/or Infection/aspiration. 4. Coronary artery atherosclerosis. Aortic Atherosclerosis. 5. Trace nonspecific pelvic fluid.  Most recent lab results (03/17/18) of CBC w/diff and CMP is as follows: all values are WNL except for WBC at 13.1k, RBC at 2.67, HGB at 10.1, HCT at 31.0, MCV at 116.1, MCH at 37.8, PLT at 103k, ANC at 900, Lymphs abs at 11.1k, BUN at 31, Calcium at 8.7, Total Protein at 6.2, Albumin at 3.4. 03/17/18 LDH at 241  On review of systems, pt reports new fatigue, recent fevers, recent night sweats, recently grown neck lymph nodes, eating well, some weight loss, right groin pain, and denies chills, SOB, ear pain, pain along the spine, frequent infections, flank pain, leg swelling, testicular pain or swelling, and any other symptoms.  Interval History:   Jake Kennedy returns today for management and evaluation of his CLL. The patient's last visit with [place] was on 07/10/18. The pt reports that he is doing well overall.  The pt reports that he has felt more tired this week, noting that he didn't have enough energy to ride his stationary bike this week. He notes that his appetite has waned some, but he is "eating at least 2500 calories per day." The pt denies fevers or chills. He notes that he is having some night sweats early in the night, noting that these are "pretty heavy, but I use lots of covers and feel hot." He is waking up to urinate throughout the night as well.   The pt has held Venetoclax for the last week. He has continued on his Dexamethasone taper.  The pt notes that he sees clear, sometimes green phlegm every third day. He denie  Lab results today (07/17/18) of CBC and CMP is as follows: all values are WNL except for WBC at 700, RBC at 3.27, HGB at 12.0, HCT at 37.1, MCV at 113.5, MCH at 36.7, PLT at 141k,Glucose at 158, BUN at 26, Calcium at 8.6, Total Protein at 6.2, Albumin at 3.1, ALT at 82. 07/17/18 ANC 500,  additional lymphopenia. 07/17/18 Uric acid is WNL at 4.3 07/17/18 LDH is at 240  On review of systems, pt reports feeling more tired, lower appetite, eating well, some night sweats, and denies fevers, chills, concern for infections, abdominal pains, mouth sores, sore throat, and any other symptoms.   MEDICAL HISTORY:  Past Medical History:  Diagnosis Date   Basal cell carcinoma    moles removed in 2007 and 2012 from L nose and L hip   BPH (benign prostatic hypertrophy)    CAD (coronary artery disease), native coronary artery    a. 02/10/2015 95% mid LAD stenosis s/p DES  b. 07/15/16: LHC showed stable non obst dz and patent stent   CLL (chronic lymphocytic leukemia) ([place])    ED (erectile dysfunction)    Lymphocytosis 02/11/2015   seen by Dr. Lindi Adie 02/11/2015, working up for possible CLL    SURGICAL HISTORY: Past Surgical History:  Procedure Laterality Date   CARDIAC CATHETERIZATION  02/10/2015   CARDIAC CATHETERIZATION N/A 02/10/2015   Procedure:  Left Heart Cath and Coronary Angiography;  Surgeon: Sherren Mocha, MD;  Location: [facility];  Service: Cardiovascular;  Laterality: N/A;   CARDIAC CATHETERIZATION N/A 02/10/2015   Procedure: Coronary Stent Intervention;  Surgeon: Sherren Mocha, MD;  Location: [facility];  Service: Cardiovascular;  Laterality: N/A;   CORONARY STENT PLACEMENT  02/10/2015   DES to LAD   LAMINECTOMY     LEFT HEART CATH AND CORONARY ANGIOGRAPHY N/A 07/15/2016   Procedure: Left Heart Cath and Coronary Angiography;  Surgeon: Martinique, Peter M, MD;  Location: [facility];  Service: Cardiovascular;  Laterality: N/A;   TONSILLECTOMY      SOCIAL HISTORY: Social History   Socioeconomic History   Marital status: Married    Spouse name: Not on file   Number of children: Not on file   Years of education: Not on file   Highest education level: Not on file  Occupational History   Not on file  Social Needs   Financial resource  strain: Not on file   Food insecurity    Worry: Not on file    Inability: Not on file   Transportation needs    Medical: Not on file    Non-medical: Not on file  Tobacco Use   Smoking status: Never Smoker   Smokeless tobacco: Never Used  Substance and Sexual Activity   Alcohol use: Yes    Alcohol/week: 0.0 standard drinks    Comment: occasional   Drug use: No   Sexual activity: Not on file  Lifestyle   Physical activity    Days per week: Not on file    Minutes per session: Not on file   Stress: Not on file  Relationships   Social connections    Talks on phone: Not on file    Gets together: Not on file    Attends religious service: Not on file    Active member of club or organization: Not on file    Attends meetings of clubs or organizations: Not on file    Relationship status: Not on file   Intimate partner violence    Fear of current or ex partner: Not on file    Emotionally abused: Not on file    Physically abused: Not on file    Forced sexual activity: Not on file  Other Topics Concern   Not on file  Social History Narrative   Not on file    FAMILY HISTORY: Family History  Problem Relation Age of Onset   Diabetes Mother    Heart failure Mother    Heart attack Mother        occured in 62s.    Healthy Sister    Healthy Brother    Healthy Maternal Grandmother    Healthy Maternal Grandfather    Healthy Paternal Grandmother    Healthy Paternal Grandfather    Healthy Brother     ALLERGIES:  is allergic to adhesive [tape] and latex.  MEDICATIONS:  Current Outpatient Medications  Medication Sig Dispense Refill   aspirin 81 MG tablet Take 81 mg by mouth daily.     atorvastatin (LIPITOR) 80 MG tablet Take 1 tablet (80 mg total) by mouth daily at 6 PM. 90 tablet 3   dexamethasone (DECADRON) 1 MG tablet 4mg  po BID for 2 days prior to each dose of Gazyva 60 tablet 0   Multiple Vitamin (MULTIVITAMIN) tablet Take 1 tablet by mouth daily.      Multiple Vitamins-Minerals (PRESERVISION AREDS PO)  Take 2 capsules by mouth daily.     nitroGLYCERIN (NITROSTAT) 0.4 MG SL tablet Place 1 tablet (0.4 mg total) under the tongue every 5 (five) minutes as needed for chest pain. 25 tablet 3   Omega-3 Fatty Acids (FISH OIL CONCENTRATE PO) Take 1 capsule by mouth daily.      ondansetron (ZOFRAN) 8 MG tablet Take 1 tablet (8 mg total) by mouth every 8 (eight) hours as needed for nausea. 30 tablet 3   vardenafil (LEVITRA) 10 MG tablet Take 10 mg by mouth as directed.     venetoclax (VENCLEXTA) 100 MG TABS Take 400 mg by mouth daily. Take with a meal and a glass of water at approximately the same time daily. 120 tablet 1   No current facility-administered medications for this visit.     REVIEW OF SYSTEMS:    A 10+ POINT REVIEW OF SYSTEMS WAS OBTAINED including neurology, dermatology, psychiatry, cardiac, respiratory, lymph, extremities, GI, GU, Musculoskeletal, constitutional, breasts, reproductive, HEENT.  All pertinent positives are noted in the HPI.  All others are negative.   PHYSICAL EXAMINATION: ECOG PERFORMANCE STATUS: 1 - Symptomatic but completely ambulatory  Vitals:   07/17/18 0845  BP: (!) 141/79  Pulse: 80  Resp: 17  Temp: 98.2 F (36.8 C)  SpO2: 97%   Filed Weights   07/17/18 0845  Weight: 164 lb 6.4 oz (74.6 kg)   .Body mass index is 21.11 kg/m.   GENERAL:alert, in no acute distress and comfortable SKIN: no acute rashes, no significant lesions EYES: conjunctiva are pink and non-injected, sclera anicteric OROPHARYNX: MMM, no exudates, no oropharyngeal erythema or ulceration NECK: supple, no JVD LYMPH:  no palpable lymphadenopathy in the cervical, axillary or inguinal regions LUNGS: clear to auscultation b/l with normal respiratory effort HEART: regular rate & rhythm ABDOMEN:  normoactive bowel sounds , non tender, not distended. No palpable hepatosplenomegaly.  Extremity: no pedal edema PSYCH: alert &  oriented x 3 with fluent speech NEURO: no focal motor/sensory deficits    LABORATORY DATA:  I have reviewed the data as listed  . CBC Latest Ref Rng & Units 07/17/2018 07/09/2018 06/30/2018  WBC 4.0 - 10.5 K/uL 0.7(LL) 2.3(L) 3.4(L)  Hemoglobin 13.0 - 17.0 g/dL 12.0(L) 12.4(L) 11.2(L)  Hematocrit 39.0 - 52.0 % 37.1(L) 39.2 35.3(L)  Platelets 150 - 400 K/uL 141(L) 96(L) 135(L)  ANC 700<---600<---1200  . CMP Latest Ref Rng & Units 07/17/2018 07/09/2018 06/30/2018  Glucose 70 - 99 mg/dL 158(H) 111(H) 108(H)  BUN 8 - 23 mg/dL 26(H) 28(H) 30(H)  Creatinine 0.61 - 1.24 mg/dL 0.84 0.83 0.79  Sodium 135 - 145 mmol/L 139 137 137  Potassium 3.5 - 5.1 mmol/L 3.9 3.8 5.0  Chloride 98 - 111 mmol/L 101 102 103  CO2 22 - 32 mmol/L 25 21(L) 25  Calcium 8.9 - 10.3 mg/dL 8.6(L) 9.1 8.4(L)  Total Protein 6.5 - 8.1 g/dL 6.2(L) 6.5 5.9(L)  Total Bilirubin 0.3 - 1.2 mg/dL 1.1 1.0 1.0  Alkaline Phos 38 - 126 U/L 65 58 46  AST 15 - 41 U/L 24 12(L) 13(L)  ALT 0 - 44 U/L 82(H) 18 27    04/30/18 Outside Labs:                RADIOGRAPHIC STUDIES: I have personally reviewed the radiological images as listed and agreed with the findings in the report. No results found.  ASSESSMENT & PLAN:  68 y.o. male with  1. Chronic Lymphocytic Leukemia Initial diagnosis on 02/13/15 Peripheral blood flow  cytometry which revealed a Monoclonal B-cell population 09/25/15 Cytogenetics ruled out Mantle cell lymphoma. 12/25/17 FISH study ruled out translocation 11;14 12/30/17 BM Biopsy revealed CLL without evidence of transformation  03/17/18 FISH CLL Prognostic panel revealed an 11q deletion and Trisomy 12  03/19/18 CT N/C/A/P revealed NECK: Multiple enlarged bilateral cervical lymph nodes consistent with the given diagnosis of CLL. The largest node is a right level 3 lymph node measuring 3.4 x 2.8 x 1.2 cm. 2. Degenerative changes of the cervical spine. 3. Hypodense lesion in the right side of the tongue measuring up  to 1.5 cm. This could represent focal infection. Neoplasm is considered less likely. The lesion appears to be superficial along the right lateral aspect of the anterior tongue a. This should be amenable to direct inspection, C/A/P: Marked adenopathy within the chest, abdomen, and pelvis, consistent with active lymphoma/leukemia. 2. Splenomegaly is nonspecific. Splenic involvement cannot be excluded. 3. Right greater than left and basilar predominant peribronchovascular interstitial thickening and nodularity. Considerations include pulmonary involvement of lymphoma and/or Infection/aspiration. 4. Coronary artery atherosclerosis. Aortic Atherosclerosis. 5. Trace nonspecific pelvic fluid.  03/26/18 Left cervical LN biopsy revealed SLL/CLL  PLAN: -Discussed pt labwork today, 07/17/18; WBC low at 700. HGB stable at 12.0. PLT improved to 141k. Kidney functions normal. Albumin borderline low. LDH at 240. -Recommend holding Gazyva today, and will tentatively plan to begin C2D1 Gazyva in two weeks. Dyann Kief will be monthly from C2 onwards -Neulasta today and advised strict neutropenic precautions and presenting to the ED for fevers of 100.4 or greater -Continue holding 200mg  Venetoclax through next week -Focus on eating, staying active, and strict infection prevention precautions -Recommend steam inhalation with distilled water -Discussed that I will add Demerol to his pre-medications prior to Gazyva infusion. Also two days of 4mg  Dexamethasone BID prior to subsequent infusions. -Will now taper to 1mg  Dexamethasone every other day for one week, then stop. Tapering only if pt does not develop fevers. -Pt is staying well hydrated, no indication for IVF at this time -Recommended that the pt continue to eat well, drink at least 48-64 oz of water each day, and walk 20-30 minutes each day. -Advised crowd avoidance, infection prevention strategies, and frequent hand washing. -Will check labs again in 1 week -Will see  the pt back in 2 weeks   Labs only in 1 week Plz move labs/MD and Gazyva appointment from 6/19 - 1 week further out to 6/25 or 6/26   All of the patients questions were answered with apparent satisfaction. The patient knows to call the clinic with any problems, questions or concerns.  The total time spent in the appt was 25 minutes and more than 50% was on counseling and direct patient cares.    Sullivan Lone MD MS AAHIVMS [facility] Hematology/Oncology Physician [facility]  (Office):       [phone] (Work cell):  [phone] (Fax):           [phone]  07/17/2018 9:34 AM  I, Baldwin Jamaica, am acting as a scribe for Dr. Sullivan Lone.   .I have reviewed the above documentation for accuracy and completeness, and I agree with the above. Brunetta Genera MD

## 2018-07-17 ENCOUNTER — Inpatient Hospital Stay: Payer: BC Managed Care – PPO

## 2018-07-17 ENCOUNTER — Inpatient Hospital Stay (HOSPITAL_BASED_OUTPATIENT_CLINIC_OR_DEPARTMENT_OTHER): Payer: BC Managed Care – PPO | Admitting: Hematology

## 2018-07-17 ENCOUNTER — Telehealth: Payer: Self-pay | Admitting: *Deleted

## 2018-07-17 ENCOUNTER — Other Ambulatory Visit: Payer: Self-pay

## 2018-07-17 VITALS — BP 141/79 | HR 80 | Temp 98.2°F | Resp 17 | Ht 74.0 in | Wt 164.4 lb

## 2018-07-17 DIAGNOSIS — N400 Enlarged prostate without lower urinary tract symptoms: Secondary | ICD-10-CM | POA: Diagnosis not present

## 2018-07-17 DIAGNOSIS — R61 Generalized hyperhidrosis: Secondary | ICD-10-CM

## 2018-07-17 DIAGNOSIS — N529 Male erectile dysfunction, unspecified: Secondary | ICD-10-CM | POA: Diagnosis not present

## 2018-07-17 DIAGNOSIS — I700 Atherosclerosis of aorta: Secondary | ICD-10-CM | POA: Diagnosis not present

## 2018-07-17 DIAGNOSIS — Z79899 Other long term (current) drug therapy: Secondary | ICD-10-CM | POA: Diagnosis not present

## 2018-07-17 DIAGNOSIS — C9110 Chronic lymphocytic leukemia of B-cell type not having achieved remission: Secondary | ICD-10-CM

## 2018-07-17 DIAGNOSIS — R161 Splenomegaly, not elsewhere classified: Secondary | ICD-10-CM | POA: Diagnosis not present

## 2018-07-17 DIAGNOSIS — R490 Dysphonia: Secondary | ICD-10-CM | POA: Diagnosis not present

## 2018-07-17 DIAGNOSIS — Z7982 Long term (current) use of aspirin: Secondary | ICD-10-CM | POA: Diagnosis not present

## 2018-07-17 DIAGNOSIS — M47812 Spondylosis without myelopathy or radiculopathy, cervical region: Secondary | ICD-10-CM | POA: Diagnosis not present

## 2018-07-17 DIAGNOSIS — I2510 Atherosclerotic heart disease of native coronary artery without angina pectoris: Secondary | ICD-10-CM | POA: Diagnosis not present

## 2018-07-17 DIAGNOSIS — Z85828 Personal history of other malignant neoplasm of skin: Secondary | ICD-10-CM | POA: Diagnosis not present

## 2018-07-17 DIAGNOSIS — Z5112 Encounter for antineoplastic immunotherapy: Secondary | ICD-10-CM

## 2018-07-17 DIAGNOSIS — D702 Other drug-induced agranulocytosis: Secondary | ICD-10-CM

## 2018-07-17 DIAGNOSIS — R590 Localized enlarged lymph nodes: Secondary | ICD-10-CM | POA: Diagnosis not present

## 2018-07-17 DIAGNOSIS — R140 Abdominal distension (gaseous): Secondary | ICD-10-CM | POA: Diagnosis not present

## 2018-07-17 DIAGNOSIS — J069 Acute upper respiratory infection, unspecified: Secondary | ICD-10-CM | POA: Diagnosis not present

## 2018-07-17 DIAGNOSIS — Z9189 Other specified personal risk factors, not elsewhere classified: Secondary | ICD-10-CM

## 2018-07-17 LAB — PHOSPHORUS: Phosphorus: 3.4 mg/dL (ref 2.5–4.6)

## 2018-07-17 LAB — CMP (CANCER CENTER ONLY)
ALT: 82 U/L — ABNORMAL HIGH (ref 0–44)
AST: 24 U/L (ref 15–41)
Albumin: 3.1 g/dL — ABNORMAL LOW (ref 3.5–5.0)
Alkaline Phosphatase: 65 U/L (ref 38–126)
Anion gap: 13 (ref 5–15)
BUN: 26 mg/dL — ABNORMAL HIGH (ref 8–23)
CO2: 25 mmol/L (ref 22–32)
Calcium: 8.6 mg/dL — ABNORMAL LOW (ref 8.9–10.3)
Chloride: 101 mmol/L (ref 98–111)
Creatinine: 0.84 mg/dL (ref 0.61–1.24)
GFR, Est AFR Am: >60 mL/min (ref >60)
GFR, Estimated: >60 mL/min (ref >60)
Glucose, Bld: 158 mg/dL — ABNORMAL HIGH (ref 70–99)
Potassium: 3.9 mmol/L (ref 3.5–5.1)
Sodium: 139 mmol/L (ref 135–145)
Total Bilirubin: 1.1 mg/dL (ref 0.3–1.2)
Total Protein: 6.2 g/dL — ABNORMAL LOW (ref 6.5–8.1)

## 2018-07-17 LAB — URIC ACID: Uric Acid, Serum: 4.3 mg/dL (ref 3.7–8.6)

## 2018-07-17 LAB — CBC WITH DIFFERENTIAL/PLATELET
Band Neutrophils: 8 %
Basophils Absolute: 0 10*3/uL (ref 0.0–0.1)
Basophils Relative: 0 %
Eosinophils Absolute: 0 10*3/uL (ref 0.0–0.5)
Eosinophils Relative: 0 %
HCT: 37.1 % — ABNORMAL LOW (ref 39.0–52.0)
Hemoglobin: 12 g/dL — ABNORMAL LOW (ref 13.0–17.0)
Lymphocytes Relative: 46 %
Lymphs Abs: 0.3 10*3/uL — ABNORMAL LOW (ref 0.7–4.0)
MCH: 36.7 pg — ABNORMAL HIGH (ref 26.0–34.0)
MCHC: 32.3 g/dL (ref 30.0–36.0)
MCV: 113.5 fL — ABNORMAL HIGH (ref 80.0–100.0)
Monocytes Absolute: 0 10*3/uL — ABNORMAL LOW (ref 0.1–1.0)
Monocytes Relative: 1 %
Neutro Abs: 0.5 10*3/uL — ABNORMAL LOW (ref 1.7–7.7)
Neutrophils Relative %: 45 %
Platelets: 141 10*3/uL — ABNORMAL LOW (ref 150–400)
RBC: 3.27 MIL/uL — ABNORMAL LOW (ref 4.22–5.81)
RDW: 14 % (ref 11.5–15.5)
WBC: 0.7 10*3/uL — CL (ref 4.0–10.5)
nRBC: 0 % (ref 0.0–0.2)

## 2018-07-17 LAB — LACTATE DEHYDROGENASE: LDH: 240 U/L — ABNORMAL HIGH (ref 98–192)

## 2018-07-17 MED ORDER — PEGFILGRASTIM INJECTION 6 MG/0.6ML ~~LOC~~
PREFILLED_SYRINGE | SUBCUTANEOUS | Status: AC
  Filled 2018-07-17: qty 0.6

## 2018-07-17 MED ORDER — PEGFILGRASTIM INJECTION 6 MG/0.6ML ~~LOC~~
6.0000 mg | PREFILLED_SYRINGE | Freq: Once | SUBCUTANEOUS | Status: AC
  Administered 2018-07-17: 6 mg via SUBCUTANEOUS

## 2018-07-17 NOTE — Telephone Encounter (Signed)
Received call report from [facility]  "Today's [place] = 0.4."  Secure Chat message sent with results.  Scheduled provider F/U today.

## 2018-07-20 ENCOUNTER — Telehealth: Payer: Self-pay | Admitting: Hematology

## 2018-07-20 NOTE — Telephone Encounter (Signed)
Scheduled appt per 6/12 los. Spoke with patient and patient aware of appt date and time. °

## 2018-07-21 ENCOUNTER — Encounter: Payer: Self-pay | Admitting: Hematology

## 2018-07-23 ENCOUNTER — Telehealth: Payer: Self-pay | Admitting: *Deleted

## 2018-07-23 NOTE — Progress Notes (Signed)
HEMATOLOGY/ONCOLOGY CLINIC NOTE  Date of Service: 07/24/2018  Patient Care Team: [name], MD as PCP - General (Internal Medicine) Sherren Mocha, MD as PCP - Cardiology (Cardiology)  CHIEF COMPLAINTS/PURPOSE OF CONSULTATION:  Chronic Lymphocytic Leukemia  Oncologic History:   Oncology History  Chronic lymphocytic leukemia (CLL), B-cell ([place])  02/14/2015 Initial Diagnosis   Monoclonal B-cell population CD5 and CD20 positive, differential diagnosis CLL versus mantle cell lymphoma ( patient had absolute lymphocyte count of 8000 in 2010), remained stable.   07/09/2018 -  Chemotherapy   The patient had obinutuzumab (GAZYVA) 100 mg in sodium chloride 0.9 % 100 mL (0.9615 mg/mL) chemo infusion, 100 mg, Intravenous, Once, 1 of 6 cycles Administration: 100 mg (07/09/2018), 900 mg (07/10/2018)  for chemotherapy treatment.       HISTORY OF PRESENTING ILLNESS:  Jake Kennedy is a wonderful 68 y.o. male who has been referred to [place] by Dr. [name] for evaluation and management of Chronic Lymphocytic Leukemia. The pt was formerly under the care of my colleague Dr. Nicholas Lose. He is accompanied today by his wife. The pt reports that he is doing well overall.  The pt reports that his course of CLL had been pretty consistent since diagnosis in 2017, until roughly 3 months ago in roughly November 2019. He notes that he has had recent increase in fatigue in the last 3 months, and more acutely in the last 3 weeks. He endorses occasional fevers over the last two weeks during the night, highest at 100.2 or 100.3. He also endorses some night sweats, not every night, some of these being "drenching." He notes that he has been eating well for the last 6 months, however has lost 8 pounds in that interim. He endorses regular exercising until the past month, due to his worsened fatigue, and had previously regularly run upwards of 3 miles and bike for several miles as well.  The pt notes that he can't  smell very well, in the last 2-3 weeks, and his hearing has also reduced in the last week. He senses that his enlarged lymph nodes are pressing on his neck, and characterizes this as similar to when one has an acute cold and congestion. He had acute pain in his right hip a week ago and saw my colleague Sandi Mealy, PA in Symptom Managment. He was given Norco but denies taking this because he didn't want to. He endorses a positional element to his hip pain, which e attributes to the pain being from an enlarged lymph node. He notes that his hip pain had sudden onset on the night of 03/18/18. He notes that his neck lymph nodes have grown in the last 3 weeks to a month. The pt notes that his lymph nodes in his neck, armpits, and groin have been enlarged.  The pt tried an antibiotic course 2 weeks ago with his PCP for a suspected sinus infection, during which his cervical lymph nodes enlarged further. He notes that his neck lymph nodes are somewhat improved today as compared to this. The pt denies frequent infections.   Of note prior to the patient's visit today, pt has had CT N/C/A/P completed on 03/19/18 with results revealing NECK: Multiple enlarged bilateral cervical lymph nodes consistent with the given diagnosis of CLL. The largest node is a right level 3 lymph node measuring 3.4 x 2.8 x 1.2 cm. 2. Degenerative changes of the cervical spine. 3. Hypodense lesion in the right side of the tongue measuring up to  1.5 cm. This could represent focal infection. Neoplasm is considered less likely. The lesion appears to be superficial along the right lateral aspect of the anterior tongue a. This should be amenable to direct inspection, C/A/P: Marked adenopathy within the chest, abdomen, and pelvis, consistent with active lymphoma/leukemia. 2. Splenomegaly is nonspecific. Splenic involvement cannot be excluded. 3. Right greater than left and basilar predominant peribronchovascular interstitial thickening and nodularity.  Considerations include pulmonary involvement of lymphoma and/or Infection/aspiration. 4. Coronary artery atherosclerosis. Aortic Atherosclerosis. 5. Trace nonspecific pelvic fluid.  Most recent lab results (03/17/18) of CBC w/diff and CMP is as follows: all values are WNL except for WBC at 13.1k, RBC at 2.67, HGB at 10.1, HCT at 31.0, MCV at 116.1, MCH at 37.8, PLT at 103k, ANC at 900, Lymphs abs at 11.1k, BUN at 31, Calcium at 8.7, Total Protein at 6.2, Albumin at 3.4. 03/17/18 LDH at 241  On review of systems, pt reports new fatigue, recent fevers, recent night sweats, recently grown neck lymph nodes, eating well, some weight loss, right groin pain, and denies chills, SOB, ear pain, pain along the spine, frequent infections, flank pain, leg swelling, testicular pain or swelling, and any other symptoms.  Interval History:   Jake Kennedy returns today for management and evaluation of his CLL. The patient's last visit with [place] was on 07/17/18. The pt reports that he is doing well overall.  The pt reports that he has continued on his steroid taper. He notes that over the past 3 weeks he feels that he has had less energy, and has been decreasing his activity as well. He notes that he is urinating about every 1-2 hours throughout the night and notes that he is "constantly drinking water, up until 10pm when we go to bed."    He notes that he has always had tea or coffee in the mornings, but has decided to hold this during the last week. He feels more bloated, which "has made it harder to eat." He feels that he is "breathing harder, and is just more physically tired." The pt notes that he had a fever one week ago of 102, which resolved within 4 hours after taking two Tylenol. He notes he is bringing up phlegm from his "upper throat," a few times a day, sometimes clear, sometimes green.   He denies mouth sores, new bone pains, discomfort urinating, skin rashes. He notes that he is eating every meal and has had  some night sweats. He is taking a multivitamin, eye preservation, and fish oil pill.  Lab results today (07/24/18) of CBC w/diff is as follows: all values are WNL except for WBC at 2.9k, RBC at 3.29, HGB at 11.8, HCT at 37.9, MCV at 115.2, MCH at 35.9, Lymphs abs at 300.  On review of systems, pt reports lower energy levels, not sleeping well, feeling tired, less active, frequent urination at night, some night sweats, some phlegm production, one fever one week ago, and denies leg swelling, skin rashes, repeated fevers, chills, mouth sores, discomfort urinating, and any other symptoms.   MEDICAL HISTORY:  Past Medical History:  Diagnosis Date   Basal cell carcinoma    moles removed in 2007 and 2012 from L nose and L hip   BPH (benign prostatic hypertrophy)    CAD (coronary artery disease), native coronary artery    a. 02/10/2015 95% mid LAD stenosis s/p DES  b. 07/15/16: LHC showed stable non obst dz and patent stent   CLL (chronic lymphocytic  leukemia) [facility])    ED (erectile dysfunction)    Lymphocytosis 02/11/2015   seen by Dr. Lindi Adie 02/11/2015, working up for possible CLL    SURGICAL HISTORY: Past Surgical History:  Procedure Laterality Date   CARDIAC CATHETERIZATION  02/10/2015   CARDIAC CATHETERIZATION N/A 02/10/2015   Procedure: Left Heart Cath and Coronary Angiography;  Surgeon: Sherren Mocha, MD;  Location: [facility];  Service: Cardiovascular;  Laterality: N/A;   CARDIAC CATHETERIZATION N/A 02/10/2015   Procedure: Coronary Stent Intervention;  Surgeon: Sherren Mocha, MD;  Location: [facility];  Service: Cardiovascular;  Laterality: N/A;   CORONARY STENT PLACEMENT  02/10/2015   DES to LAD   LAMINECTOMY     LEFT HEART CATH AND CORONARY ANGIOGRAPHY N/A 07/15/2016   Procedure: Left Heart Cath and Coronary Angiography;  Surgeon: Martinique, Peter M, MD;  Location: [facility];  Service: Cardiovascular;  Laterality: N/A;   TONSILLECTOMY      SOCIAL  HISTORY: Social History   Socioeconomic History   Marital status: Married    Spouse name: Not on file   Number of children: Not on file   Years of education: Not on file   Highest education level: Not on file  Occupational History   Not on file  Social Needs   Financial resource strain: Not on file   Food insecurity    Worry: Not on file    Inability: Not on file   Transportation needs    Medical: Not on file    Non-medical: Not on file  Tobacco Use   Smoking status: Never Smoker   Smokeless tobacco: Never Used  Substance and Sexual Activity   Alcohol use: Yes    Alcohol/week: 0.0 standard drinks    Comment: occasional   Drug use: No   Sexual activity: Not on file  Lifestyle   Physical activity    Days per week: Not on file    Minutes per session: Not on file   Stress: Not on file  Relationships   Social connections    Talks on phone: Not on file    Gets together: Not on file    Attends religious service: Not on file    Active member of club or organization: Not on file    Attends meetings of clubs or organizations: Not on file    Relationship status: Not on file   Intimate partner violence    Fear of current or ex partner: Not on file    Emotionally abused: Not on file    Physically abused: Not on file    Forced sexual activity: Not on file  Other Topics Concern   Not on file  Social History Narrative   Not on file    FAMILY HISTORY: Family History  Problem Relation Age of Onset   Diabetes Mother    Heart failure Mother    Heart attack Mother        occured in 38s.    Healthy Sister    Healthy Brother    Healthy Maternal Grandmother    Healthy Maternal Grandfather    Healthy Paternal Grandmother    Healthy Paternal Grandfather    Healthy Brother     ALLERGIES:  is allergic to adhesive [tape] and latex.  MEDICATIONS:  Current Outpatient Medications  Medication Sig Dispense Refill   aspirin 81 MG tablet Take 81 mg  by mouth daily.     atorvastatin (LIPITOR) 80 MG tablet Take 1 tablet (80 mg total)  by mouth daily at 6 PM. 90 tablet 3   dexamethasone (DECADRON) 1 MG tablet 4mg  po BID for 2 days prior to each dose of Gazyva 60 tablet 0   dexamethasone (DECADRON) 1 MG tablet 2mg  (2 tabs) daily with breakfast for 3 days then 1mg  (1 tab) daily with breakfast. Continue pre-Gazyva dose as per previous instructions. 60 tablet 0   doxycycline (VIBRA-TABS) 100 MG tablet Take 1 tablet (100 mg total) by mouth 2 (two) times daily. 14 tablet 0   Multiple Vitamin (MULTIVITAMIN) tablet Take 1 tablet by mouth daily.     Multiple Vitamins-Minerals (PRESERVISION AREDS PO) Take 2 capsules by mouth daily.     nitroGLYCERIN (NITROSTAT) 0.4 MG SL tablet Place 1 tablet (0.4 mg total) under the tongue every 5 (five) minutes as needed for chest pain. 25 tablet 3   Omega-3 Fatty Acids (FISH OIL CONCENTRATE PO) Take 1 capsule by mouth daily.      ondansetron (ZOFRAN) 8 MG tablet Take 1 tablet (8 mg total) by mouth every 8 (eight) hours as needed for nausea. 30 tablet 3   vardenafil (LEVITRA) 10 MG tablet Take 10 mg by mouth as directed.     venetoclax (VENCLEXTA) 100 MG TABS Take 400 mg by mouth daily. Take with a meal and a glass of water at approximately the same time daily. 120 tablet 1   No current facility-administered medications for this visit.     REVIEW OF SYSTEMS:    A 10+ POINT REVIEW OF SYSTEMS WAS OBTAINED including neurology, dermatology, psychiatry, cardiac, respiratory, lymph, extremities, GI, GU, Musculoskeletal, constitutional, breasts, reproductive, HEENT.  All pertinent positives are noted in the HPI.  All others are negative.   PHYSICAL EXAMINATION: ECOG PERFORMANCE STATUS: 1 - Symptomatic but completely ambulatory  Vitals:   07/24/18 1014  BP: 131/81  Pulse: 65  Resp: 17  Temp: 98.3 F (36.8 C)  SpO2: 95%   Filed Weights   07/24/18 1014  Weight: 164 lb 6.4 oz (74.6 kg)   .Body mass index  is 21.11 kg/m.   GENERAL:alert, in no acute distress and comfortable SKIN: no acute rashes, no significant lesions EYES: conjunctiva are pink and non-injected, sclera anicteric OROPHARYNX: MMM, no exudates, no oropharyngeal erythema or ulceration NECK: supple, no JVD LYMPH:  no palpable lymphadenopathy in the cervical, axillary or inguinal regions LUNGS: clear to auscultation b/l with normal respiratory effort HEART: regular rate & rhythm ABDOMEN:  normoactive bowel sounds , non tender, not distended. No palpable hepatosplenomegaly.  Extremity: no pedal edema PSYCH: alert & oriented x 3 with fluent speech NEURO: no focal motor/sensory deficits    LABORATORY DATA:  I have reviewed the data as listed  . CBC Latest Ref Rng & Units 07/24/2018 07/17/2018 07/09/2018  WBC 4.0 - 10.5 K/uL 2.9(L) 0.7(LL) 2.3(L)  Hemoglobin 13.0 - 17.0 g/dL 11.8(L) 12.0(L) 12.4(L)  Hematocrit 39.0 - 52.0 % 37.9(L) 37.1(L) 39.2  Platelets 150 - 400 K/uL 164 141(L) 96(L)  ANC 700<---600<---1200  . CMP Latest Ref Rng & Units 07/17/2018 07/09/2018 06/30/2018  Glucose 70 - 99 mg/dL 158(H) 111(H) 108(H)  BUN 8 - 23 mg/dL 26(H) 28(H) 30(H)  Creatinine 0.61 - 1.24 mg/dL 0.84 0.83 0.79  Sodium 135 - 145 mmol/L 139 137 137  Potassium 3.5 - 5.1 mmol/L 3.9 3.8 5.0  Chloride 98 - 111 mmol/L 101 102 103  CO2 22 - 32 mmol/L 25 21(L) 25  Calcium 8.9 - 10.3 mg/dL 8.6(L) 9.1 8.4(L)  Total Protein 6.5 - 8.1  g/dL 6.2(L) 6.5 5.9(L)  Total Bilirubin 0.3 - 1.2 mg/dL 1.1 1.0 1.0  Alkaline Phos 38 - 126 U/L 65 58 46  AST 15 - 41 U/L 24 12(L) 13(L)  ALT 0 - 44 U/L 82(H) 18 27    04/30/18 Outside Labs:                RADIOGRAPHIC STUDIES: I have personally reviewed the radiological images as listed and agreed with the findings in the report. No results found.  ASSESSMENT & PLAN:  69 y.o. male with  1. Chronic Lymphocytic Leukemia Initial diagnosis on 02/13/15 Peripheral blood flow cytometry which revealed a  Monoclonal B-cell population 09/25/15 Cytogenetics ruled out Mantle cell lymphoma. 12/25/17 FISH study ruled out translocation 11;14 12/30/17 BM Biopsy revealed CLL without evidence of transformation  03/17/18 FISH CLL Prognostic panel revealed an 11q deletion and Trisomy 12  03/19/18 CT N/C/A/P revealed NECK: Multiple enlarged bilateral cervical lymph nodes consistent with the given diagnosis of CLL. The largest node is a right level 3 lymph node measuring 3.4 x 2.8 x 1.2 cm. 2. Degenerative changes of the cervical spine. 3. Hypodense lesion in the right side of the tongue measuring up to 1.5 cm. This could represent focal infection. Neoplasm is considered less likely. The lesion appears to be superficial along the right lateral aspect of the anterior tongue a. This should be amenable to direct inspection, C/A/P: Marked adenopathy within the chest, abdomen, and pelvis, consistent with active lymphoma/leukemia. 2. Splenomegaly is nonspecific. Splenic involvement cannot be excluded. 3. Right greater than left and basilar predominant peribronchovascular interstitial thickening and nodularity. Considerations include pulmonary involvement of lymphoma and/or Infection/aspiration. 4. Coronary artery atherosclerosis. Aortic Atherosclerosis. 5. Trace nonspecific pelvic fluid.  03/26/18 Left cervical LN biopsy revealed SLL/CLL  PLAN: -Discussed pt labwork today, 07/24/18; WBC improved to 2.9k with ANC normalized to 2.5k. PLT normalized to 164k. HGB stable at 11.8. -Advised that pt hold off drinking water just after dinner to improve night time urination and sleep interruption -Return to 2mg  Dexamethasone daily for 3 days, then 1mg  daily -Empiric antibiotic coverage (Doxycycline) for URI given fever, recently low WBC and some reported green phlegm production. Lungs clear. -Recommend cultured yogurt as well -Continue with distilled water steam inhalation -Will tentatively plan to begin C2D1 Gazyva in one week if pt  is clinically stable. Dyann Kief will be monthly from C2 onwards. -Continue holding 200mg  Venetoclax through next week, and will likely resume at lower dose of 100mg  -Demerol with his pre-medications prior to [facility] infusion. Also two days of 4mg  Dexamethasone BID prior to subsequent infusions. -Pt is staying well hydrated, no indication for IVF at this time -Recommended that the pt continue to eat well, drink at least 48-64 oz of water each day, and walk 20-30 minutes each day. -Advised crowd avoidance, infection prevention strategies, and frequent hand washing. -Will see the pt back in 3 weeks   F/u as per scheduled appointments on 07/31/18 for labs and next cycle of Gazyva. Can move next MD visit out 3 weeks from today.   All of the patients questions were answered with apparent satisfaction. The patient knows to call the clinic with any problems, questions or concerns.  The total time spent in the appt was 25 minutes and more than 50% was on counseling and direct patient cares.    Sullivan Lone MD [place] AAHIVMS [facility] Hematology/Oncology Physician [facility]  (Office):       [phone] (Work cell):  [phone] (Fax):  [phone]  07/24/2018 11:05 AM  I, Baldwin Jamaica, am acting as a scribe for Dr. Sullivan Lone.   .I have reviewed the above documentation for accuracy and completeness, and I agree with the above. Brunetta Genera MD

## 2018-07-23 NOTE — Telephone Encounter (Signed)
Patient called - stated he did not feel well. No temp or N/V - has decrease in appetite and feels fatigued. He has lab appt at [facility] in morning and asked if Dr.Kale had any open times tomorrow. Per Dr.kale, will see him at 10:40. Contacted patient with this information. Patient in agreement. Schedule message sent.

## 2018-07-24 ENCOUNTER — Other Ambulatory Visit: Payer: BC Managed Care – PPO

## 2018-07-24 ENCOUNTER — Ambulatory Visit: Payer: BC Managed Care – PPO | Admitting: Hematology

## 2018-07-24 ENCOUNTER — Inpatient Hospital Stay: Payer: BC Managed Care – PPO

## 2018-07-24 ENCOUNTER — Other Ambulatory Visit: Payer: Self-pay

## 2018-07-24 ENCOUNTER — Ambulatory Visit: Payer: BC Managed Care – PPO

## 2018-07-24 ENCOUNTER — Inpatient Hospital Stay (HOSPITAL_BASED_OUTPATIENT_CLINIC_OR_DEPARTMENT_OTHER): Payer: BC Managed Care – PPO | Admitting: Hematology

## 2018-07-24 VITALS — BP 131/81 | HR 65 | Temp 98.3°F | Resp 17 | Ht 74.0 in | Wt 164.4 lb

## 2018-07-24 DIAGNOSIS — N529 Male erectile dysfunction, unspecified: Secondary | ICD-10-CM

## 2018-07-24 DIAGNOSIS — C9110 Chronic lymphocytic leukemia of B-cell type not having achieved remission: Secondary | ICD-10-CM

## 2018-07-24 DIAGNOSIS — Z85828 Personal history of other malignant neoplasm of skin: Secondary | ICD-10-CM

## 2018-07-24 DIAGNOSIS — I2510 Atherosclerotic heart disease of native coronary artery without angina pectoris: Secondary | ICD-10-CM | POA: Diagnosis not present

## 2018-07-24 DIAGNOSIS — R140 Abdominal distension (gaseous): Secondary | ICD-10-CM | POA: Diagnosis not present

## 2018-07-24 DIAGNOSIS — I700 Atherosclerosis of aorta: Secondary | ICD-10-CM | POA: Diagnosis not present

## 2018-07-24 DIAGNOSIS — J069 Acute upper respiratory infection, unspecified: Secondary | ICD-10-CM | POA: Diagnosis not present

## 2018-07-24 DIAGNOSIS — N400 Enlarged prostate without lower urinary tract symptoms: Secondary | ICD-10-CM

## 2018-07-24 DIAGNOSIS — D702 Other drug-induced agranulocytosis: Secondary | ICD-10-CM

## 2018-07-24 DIAGNOSIS — Z79899 Other long term (current) drug therapy: Secondary | ICD-10-CM

## 2018-07-24 DIAGNOSIS — Z5112 Encounter for antineoplastic immunotherapy: Secondary | ICD-10-CM

## 2018-07-24 DIAGNOSIS — R590 Localized enlarged lymph nodes: Secondary | ICD-10-CM | POA: Diagnosis not present

## 2018-07-24 DIAGNOSIS — M47812 Spondylosis without myelopathy or radiculopathy, cervical region: Secondary | ICD-10-CM

## 2018-07-24 DIAGNOSIS — R61 Generalized hyperhidrosis: Secondary | ICD-10-CM | POA: Diagnosis not present

## 2018-07-24 DIAGNOSIS — R490 Dysphonia: Secondary | ICD-10-CM | POA: Diagnosis not present

## 2018-07-24 DIAGNOSIS — R161 Splenomegaly, not elsewhere classified: Secondary | ICD-10-CM | POA: Diagnosis not present

## 2018-07-24 DIAGNOSIS — Z7982 Long term (current) use of aspirin: Secondary | ICD-10-CM

## 2018-07-24 LAB — CBC WITH DIFFERENTIAL/PLATELET
Abs Immature Granulocytes: 0.04 10*3/uL (ref 0.00–0.07)
Basophils Absolute: 0 10*3/uL (ref 0.0–0.1)
Basophils Relative: 1 %
Eosinophils Absolute: 0 10*3/uL (ref 0.0–0.5)
Eosinophils Relative: 0 %
HCT: 37.9 % — ABNORMAL LOW (ref 39.0–52.0)
Hemoglobin: 11.8 g/dL — ABNORMAL LOW (ref 13.0–17.0)
Immature Granulocytes: 1 %
Lymphocytes Relative: 11 %
Lymphs Abs: 0.3 10*3/uL — ABNORMAL LOW (ref 0.7–4.0)
MCH: 35.9 pg — ABNORMAL HIGH (ref 26.0–34.0)
MCHC: 31.1 g/dL (ref 30.0–36.0)
MCV: 115.2 fL — ABNORMAL HIGH (ref 80.0–100.0)
Monocytes Absolute: 0.1 10*3/uL (ref 0.1–1.0)
Monocytes Relative: 2 %
Neutro Abs: 2.5 10*3/uL (ref 1.7–7.7)
Neutrophils Relative %: 85 %
Platelets: 164 10*3/uL (ref 150–400)
RBC: 3.29 MIL/uL — ABNORMAL LOW (ref 4.22–5.81)
RDW: 15 % (ref 11.5–15.5)
WBC: 2.9 10*3/uL — ABNORMAL LOW (ref 4.0–10.5)
nRBC: 0 % (ref 0.0–0.2)

## 2018-07-24 MED ORDER — DEXAMETHASONE 1 MG PO TABS: ORAL_TABLET | ORAL | 0 refills | Status: DC

## 2018-07-24 MED ORDER — DOXYCYCLINE HYCLATE 100 MG PO TABS: 100.0000 mg | ORAL_TABLET | Freq: Two times a day (BID) | ORAL | 0 refills | Status: DC

## 2018-07-27 ENCOUNTER — Telehealth: Payer: Self-pay | Admitting: Hematology

## 2018-07-27 NOTE — Telephone Encounter (Signed)
Scheduled appt per 6/19 los. Spoke with patient and patient aware of appt date and time. °

## 2018-07-28 ENCOUNTER — Other Ambulatory Visit: Payer: Self-pay | Admitting: Hematology

## 2018-07-28 DIAGNOSIS — C9110 Chronic lymphocytic leukemia of B-cell type not having achieved remission: Secondary | ICD-10-CM

## 2018-07-31 ENCOUNTER — Other Ambulatory Visit: Payer: Self-pay | Admitting: Hematology

## 2018-07-31 ENCOUNTER — Ambulatory Visit: Payer: BC Managed Care – PPO | Admitting: Hematology

## 2018-07-31 ENCOUNTER — Inpatient Hospital Stay: Payer: BC Managed Care – PPO

## 2018-07-31 ENCOUNTER — Other Ambulatory Visit: Payer: Self-pay

## 2018-07-31 VITALS — BP 105/64 | HR 77 | Temp 97.8°F | Resp 17 | Wt 163.0 lb

## 2018-07-31 DIAGNOSIS — I700 Atherosclerosis of aorta: Secondary | ICD-10-CM | POA: Diagnosis not present

## 2018-07-31 DIAGNOSIS — R140 Abdominal distension (gaseous): Secondary | ICD-10-CM | POA: Diagnosis not present

## 2018-07-31 DIAGNOSIS — R161 Splenomegaly, not elsewhere classified: Secondary | ICD-10-CM | POA: Diagnosis not present

## 2018-07-31 DIAGNOSIS — M47812 Spondylosis without myelopathy or radiculopathy, cervical region: Secondary | ICD-10-CM | POA: Diagnosis not present

## 2018-07-31 DIAGNOSIS — J069 Acute upper respiratory infection, unspecified: Secondary | ICD-10-CM | POA: Diagnosis not present

## 2018-07-31 DIAGNOSIS — Z7189 Other specified counseling: Secondary | ICD-10-CM

## 2018-07-31 DIAGNOSIS — R61 Generalized hyperhidrosis: Secondary | ICD-10-CM | POA: Diagnosis not present

## 2018-07-31 DIAGNOSIS — D702 Other drug-induced agranulocytosis: Secondary | ICD-10-CM

## 2018-07-31 DIAGNOSIS — R590 Localized enlarged lymph nodes: Secondary | ICD-10-CM | POA: Diagnosis not present

## 2018-07-31 DIAGNOSIS — C9110 Chronic lymphocytic leukemia of B-cell type not having achieved remission: Secondary | ICD-10-CM | POA: Diagnosis not present

## 2018-07-31 DIAGNOSIS — N529 Male erectile dysfunction, unspecified: Secondary | ICD-10-CM | POA: Diagnosis not present

## 2018-07-31 DIAGNOSIS — Z7982 Long term (current) use of aspirin: Secondary | ICD-10-CM | POA: Diagnosis not present

## 2018-07-31 DIAGNOSIS — Z85828 Personal history of other malignant neoplasm of skin: Secondary | ICD-10-CM | POA: Diagnosis not present

## 2018-07-31 DIAGNOSIS — I2510 Atherosclerotic heart disease of native coronary artery without angina pectoris: Secondary | ICD-10-CM | POA: Diagnosis not present

## 2018-07-31 DIAGNOSIS — Z5112 Encounter for antineoplastic immunotherapy: Secondary | ICD-10-CM | POA: Diagnosis not present

## 2018-07-31 DIAGNOSIS — R490 Dysphonia: Secondary | ICD-10-CM | POA: Diagnosis not present

## 2018-07-31 DIAGNOSIS — Z79899 Other long term (current) drug therapy: Secondary | ICD-10-CM | POA: Diagnosis not present

## 2018-07-31 DIAGNOSIS — N400 Enlarged prostate without lower urinary tract symptoms: Secondary | ICD-10-CM | POA: Diagnosis not present

## 2018-07-31 LAB — CMP (CANCER CENTER ONLY)
ALT: 31 U/L (ref 0–44)
AST: 15 U/L (ref 15–41)
Albumin: 3.1 g/dL — ABNORMAL LOW (ref 3.5–5.0)
Alkaline Phosphatase: 70 U/L (ref 38–126)
Anion gap: 11 (ref 5–15)
BUN: 22 mg/dL (ref 8–23)
CO2: 26 mmol/L (ref 22–32)
Calcium: 8.8 mg/dL — ABNORMAL LOW (ref 8.9–10.3)
Chloride: 103 mmol/L (ref 98–111)
Creatinine: 0.83 mg/dL (ref 0.61–1.24)
GFR, Est AFR Am: >60 mL/min (ref >60)
GFR, Estimated: >60 mL/min (ref >60)
Glucose, Bld: 156 mg/dL — ABNORMAL HIGH (ref 70–99)
Potassium: 3.8 mmol/L (ref 3.5–5.1)
Sodium: 140 mmol/L (ref 135–145)
Total Bilirubin: 0.8 mg/dL (ref 0.3–1.2)
Total Protein: 6.4 g/dL — ABNORMAL LOW (ref 6.5–8.1)

## 2018-07-31 LAB — CBC WITH DIFFERENTIAL/PLATELET
Abs Immature Granulocytes: 0.06 10*3/uL (ref 0.00–0.07)
Basophils Absolute: 0 10*3/uL (ref 0.0–0.1)
Basophils Relative: 1 %
Eosinophils Absolute: 0 10*3/uL (ref 0.0–0.5)
Eosinophils Relative: 1 %
HCT: 37.4 % — ABNORMAL LOW (ref 39.0–52.0)
Hemoglobin: 11.7 g/dL — ABNORMAL LOW (ref 13.0–17.0)
Immature Granulocytes: 1 %
Lymphocytes Relative: 17 %
Lymphs Abs: 0.9 10*3/uL (ref 0.7–4.0)
MCH: 35 pg — ABNORMAL HIGH (ref 26.0–34.0)
MCHC: 31.3 g/dL (ref 30.0–36.0)
MCV: 112 fL — ABNORMAL HIGH (ref 80.0–100.0)
Monocytes Absolute: 0.1 10*3/uL (ref 0.1–1.0)
Monocytes Relative: 1 %
Neutro Abs: 4.3 10*3/uL (ref 1.7–7.7)
Neutrophils Relative %: 79 %
Platelets: 346 10*3/uL (ref 150–400)
RBC: 3.34 MIL/uL — ABNORMAL LOW (ref 4.22–5.81)
RDW: 15 % (ref 11.5–15.5)
WBC: 5.4 10*3/uL (ref 4.0–10.5)
nRBC: 0 % (ref 0.0–0.2)

## 2018-07-31 MED ORDER — ACETAMINOPHEN 325 MG PO TABS
650.0000 mg | ORAL_TABLET | Freq: Once | ORAL | Status: AC
  Administered 2018-07-31: 650 mg via ORAL

## 2018-07-31 MED ORDER — DIPHENHYDRAMINE HCL 25 MG PO CAPS
ORAL_CAPSULE | ORAL | Status: AC
  Filled 2018-07-31: qty 1

## 2018-07-31 MED ORDER — FAMOTIDINE IN NACL 20-0.9 MG/50ML-% IV SOLN
INTRAVENOUS | Status: AC
  Filled 2018-07-31: qty 50

## 2018-07-31 MED ORDER — SODIUM CHLORIDE 0.9 % IV SOLN
20.0000 mg | Freq: Once | INTRAVENOUS | Status: AC
  Administered 2018-07-31: 20 mg via INTRAVENOUS
  Filled 2018-07-31: qty 20

## 2018-07-31 MED ORDER — SODIUM CHLORIDE 0.9% FLUSH
10.0000 mL | INTRAVENOUS | Status: DC | PRN
  Filled 2018-07-31: qty 10

## 2018-07-31 MED ORDER — SODIUM CHLORIDE 0.9 % IV SOLN
1000.0000 mg | Freq: Once | INTRAVENOUS | Status: AC
  Administered 2018-07-31: 1000 mg via INTRAVENOUS
  Filled 2018-07-31: qty 40

## 2018-07-31 MED ORDER — ACETAMINOPHEN 325 MG PO TABS
ORAL_TABLET | ORAL | Status: AC
  Filled 2018-07-31: qty 2

## 2018-07-31 MED ORDER — FAMOTIDINE IN NACL 20-0.9 MG/50ML-% IV SOLN
20.0000 mg | Freq: Once | INTRAVENOUS | Status: AC
  Administered 2018-07-31: 20 mg via INTRAVENOUS

## 2018-07-31 MED ORDER — MEPERIDINE HCL 25 MG/ML IJ SOLN
INTRAMUSCULAR | Status: AC
  Filled 2018-07-31: qty 1

## 2018-07-31 MED ORDER — DIPHENHYDRAMINE HCL 50 MG/ML IJ SOLN
50.0000 mg | Freq: Once | INTRAMUSCULAR | Status: AC
  Administered 2018-07-31: 50 mg via INTRAVENOUS

## 2018-07-31 MED ORDER — SODIUM CHLORIDE 0.9 % IV SOLN
Freq: Once | INTRAVENOUS | Status: AC
  Administered 2018-07-31: 10:00:00 via INTRAVENOUS
  Filled 2018-07-31: qty 250

## 2018-07-31 MED ORDER — DIPHENHYDRAMINE HCL 25 MG PO TABS
25.0000 mg | ORAL_TABLET | Freq: Once | ORAL | Status: AC
  Administered 2018-07-31: 25 mg via ORAL
  Filled 2018-07-31: qty 1

## 2018-07-31 MED ORDER — DIPHENHYDRAMINE HCL 50 MG/ML IJ SOLN
INTRAMUSCULAR | Status: AC
  Filled 2018-07-31: qty 1

## 2018-07-31 MED ORDER — HEPARIN SOD (PORK) LOCK FLUSH 100 UNIT/ML IV SOLN
500.0000 [IU] | Freq: Once | INTRAVENOUS | Status: DC | PRN
  Filled 2018-07-31: qty 5

## 2018-07-31 MED ORDER — MEPERIDINE HCL 25 MG/ML IJ SOLN
25.0000 mg | Freq: Once | INTRAMUSCULAR | Status: AC
  Administered 2018-07-31: 25 mg via INTRAVENOUS

## 2018-07-31 NOTE — Progress Notes (Signed)
Patient received Gazyva infusion today and tolerated this well. Printed copies of Lab and AVS provided. Patient knows to call with any concerns of complains.

## 2018-07-31 NOTE — Patient Instructions (Signed)
[place] Discharge Instructions for Patients Receiving Chemotherapy  Today you received the following chemotherapy agents Obinutuzumab (GAZYVA).  To help prevent nausea and vomiting after your treatment, we encourage you to take your nausea medication as prescribed.   If you develop nausea and vomiting that is not controlled by your nausea medication, call the clinic.   BELOW ARE SYMPTOMS THAT SHOULD BE REPORTED IMMEDIATELY:  *FEVER GREATER THAN 100.5 F  *CHILLS WITH OR WITHOUT FEVER  NAUSEA AND VOMITING THAT IS NOT CONTROLLED WITH YOUR NAUSEA MEDICATION  *UNUSUAL SHORTNESS OF BREATH  *UNUSUAL BRUISING OR BLEEDING  TENDERNESS IN MOUTH AND THROAT WITH OR WITHOUT PRESENCE OF ULCERS  *URINARY PROBLEMS  *BOWEL PROBLEMS  UNUSUAL RASH Items with * indicate a potential emergency and should be followed up as soon as possible.  Feel free to call the clinic should you have any questions or concerns. The clinic phone number is [phone].  Please show the [facility] at check-in to the Emergency Department and triage nurse.  Coronavirus (COVID-19) Are you at risk?  Are you at risk for the Coronavirus (COVID-19)?  To be considered HIGH RISK for Coronavirus (COVID-19), you have to meet the following criteria:  . Traveled to [place], [place], [place], [place] or [place]; or in the [place] to [place], [place], [place], or [place]; and have fever, cough, and shortness of breath within the last 2 weeks of travel OR . Been in close contact with a person diagnosed with COVID-19 within the last 2 weeks and have fever, cough, and shortness of breath . IF YOU DO NOT MEET THESE CRITERIA, YOU ARE CONSIDERED LOW RISK FOR COVID-19.  What to do if you are HIGH RISK for COVID-19?  Marland Kitchen If you are having a medical emergency, call 911. . Seek medical care right away. Before you go to a doctor's office, urgent care or emergency department, call ahead and tell them  about your recent travel, contact with someone diagnosed with COVID-19, and your symptoms. You should receive instructions from your physician's office regarding next steps of care.  . When you arrive at healthcare provider, tell the healthcare staff immediately you have returned from visiting [place], [place], [place], [place] or [place]; or traveled in the [place] to [place], [place], [place], or [place]; in the last two weeks or you have been in close contact with a person diagnosed with COVID-19 in the last 2 weeks.   . Tell the health care staff about your symptoms: fever, cough and shortness of breath. . After you have been seen by a medical provider, you will be either: o Tested for (COVID-19) and discharged home on quarantine except to seek medical care if symptoms worsen, and asked to  - Stay home and avoid contact with others until you get your results (4-5 days)  - Avoid travel on public transportation if possible (such as bus, train, or airplane) or o Sent to the Emergency Department by EMS for evaluation, COVID-19 testing, and possible admission depending on your condition and test results.  What to do if you are LOW RISK for COVID-19?  Reduce your risk of any infection by using the same precautions used for avoiding the common cold or flu:  Marland Kitchen Wash your hands often with soap and warm water for at least 20 seconds.  If soap and water are not readily available, use an alcohol-based hand sanitizer with at least 60% alcohol.  . If coughing or  sneezing, cover your mouth and nose by coughing or sneezing into the elbow areas of your shirt or coat, into a tissue or into your sleeve (not your hands). . Avoid shaking hands with others and consider head nods or verbal greetings only. . Avoid touching your eyes, nose, or mouth with unwashed hands.  . Avoid close contact with people who are sick. . Avoid places or events with large numbers of people in one location, like concerts or  sporting events. . Carefully consider travel plans you have or are making. . If you are planning any travel outside or inside the [place], visit the CDC's Travelers' Health webpage for the latest health notices. . If you have some symptoms but not all symptoms, continue to monitor at home and seek medical attention if your symptoms worsen. . If you are having a medical emergency, call 911.   [place] / e-Visit: [email or web address]         [facility]: [facility]: [phone]                   [facility]: [phone]

## 2018-08-03 ENCOUNTER — Encounter: Payer: Self-pay | Admitting: Hematology

## 2018-08-03 ENCOUNTER — Other Ambulatory Visit: Payer: Self-pay | Admitting: Hematology

## 2018-08-03 DIAGNOSIS — R053 Chronic cough: Secondary | ICD-10-CM

## 2018-08-03 DIAGNOSIS — R05 Cough: Secondary | ICD-10-CM

## 2018-08-04 ENCOUNTER — Telehealth: Payer: Self-pay | Admitting: *Deleted

## 2018-08-04 NOTE — Telephone Encounter (Signed)
Contacted patient to inform that Dr. Irene Limbo had ordered CTs of neck and chest. Gave patient number to central scheduling. Patient states he will call them and make appt.

## 2018-08-11 ENCOUNTER — Other Ambulatory Visit: Payer: Self-pay

## 2018-08-11 ENCOUNTER — Ambulatory Visit (HOSPITAL_COMMUNITY)
Admission: RE | Admit: 2018-08-11 | Discharge: 2018-08-11 | Disposition: A | Discharge status: Home / Self Care | Payer: BC Managed Care – PPO | Source: Ambulatory Visit | Attending: Hematology | Admitting: Hematology

## 2018-08-11 ENCOUNTER — Ambulatory Visit (HOSPITAL_COMMUNITY): Admission: RE | Admit: 2018-08-11 | Payer: BC Managed Care – PPO | Source: Ambulatory Visit

## 2018-08-11 ENCOUNTER — Encounter (HOSPITAL_COMMUNITY): Payer: Self-pay

## 2018-08-11 ENCOUNTER — Ambulatory Visit (HOSPITAL_COMMUNITY): Payer: BC Managed Care – PPO

## 2018-08-11 DIAGNOSIS — R591 Generalized enlarged lymph nodes: Secondary | ICD-10-CM | POA: Diagnosis not present

## 2018-08-11 DIAGNOSIS — R05 Cough: Secondary | ICD-10-CM | POA: Insufficient documentation

## 2018-08-11 DIAGNOSIS — C9110 Chronic lymphocytic leukemia of B-cell type not having achieved remission: Secondary | ICD-10-CM | POA: Diagnosis not present

## 2018-08-11 DIAGNOSIS — R053 Chronic cough: Secondary | ICD-10-CM

## 2018-08-11 MED ORDER — IOHEXOL 300 MG/ML  SOLN
75.0000 mL | Freq: Once | INTRAMUSCULAR | Status: AC | PRN
  Administered 2018-08-11: 75 mL via INTRAVENOUS

## 2018-08-11 MED ORDER — SODIUM CHLORIDE (PF) 0.9 % IJ SOLN
INTRAMUSCULAR | Status: AC
  Filled 2018-08-11: qty 50

## 2018-08-12 NOTE — Progress Notes (Signed)
HEMATOLOGY/ONCOLOGY CLINIC NOTE  Date of Service: 08/13/2018  Patient Care Team: Brunetta Genera, MD as PCP - General (Hematology) Sherren Mocha, MD as PCP - Cardiology (Cardiology)  CHIEF COMPLAINTS/PURPOSE OF CONSULTATION:  Chronic Lymphocytic Leukemia  Oncologic History:   Oncology History  Chronic lymphocytic leukemia (CLL), B-cell ([place])  02/14/2015 Initial Diagnosis   Monoclonal B-cell population CD5 and CD20 positive, differential diagnosis CLL versus mantle cell lymphoma ( patient had absolute lymphocyte count of 8000 in 2010), remained stable.   07/09/2018 -  Chemotherapy   The patient had obinutuzumab (GAZYVA) 100 mg in sodium chloride 0.9 % 100 mL (0.9615 mg/mL) chemo infusion, 100 mg, Intravenous, Once, 2 of 6 cycles Administration: 100 mg (07/09/2018), 900 mg (07/10/2018), 1,000 mg (07/31/2018)  for chemotherapy treatment.       HISTORY OF PRESENTING ILLNESS:  Jake Kennedy is a wonderful 68 y.o. male who has been referred to [place] by Dr. [name] for evaluation and management of Chronic Lymphocytic Leukemia. The pt was formerly under the care of my colleague Dr. Nicholas Lose. He is accompanied today by his wife. The pt reports that he is doing well overall.  The pt reports that his course of CLL had been pretty consistent since diagnosis in 2017, until roughly 3 months ago in roughly November 2019. He notes that he has had recent increase in fatigue in the last 3 months, and more acutely in the last 3 weeks. He endorses occasional fevers over the last two weeks during the night, highest at 100.2 or 100.3. He also endorses some night sweats, not every night, some of these being "drenching." He notes that he has been eating well for the last 6 months, however has lost 8 pounds in that interim. He endorses regular exercising until the past month, due to his worsened fatigue, and had previously regularly run upwards of 3 miles and bike for several miles as well.  The pt  notes that he can't smell very well, in the last 2-3 weeks, and his hearing has also reduced in the last week. He senses that his enlarged lymph nodes are pressing on his neck, and characterizes this as similar to when one has an acute cold and congestion. He had acute pain in his right hip a week ago and saw my colleague Sandi Mealy, PA in Symptom Managment. He was given Norco but denies taking this because he didn't want to. He endorses a positional element to his hip pain, which e attributes to the pain being from an enlarged lymph node. He notes that his hip pain had sudden onset on the night of 03/18/18. He notes that his neck lymph nodes have grown in the last 3 weeks to a month. The pt notes that his lymph nodes in his neck, armpits, and groin have been enlarged.  The pt tried an antibiotic course 2 weeks ago with his PCP for a suspected sinus infection, during which his cervical lymph nodes enlarged further. He notes that his neck lymph nodes are somewhat improved today as compared to this. The pt denies frequent infections.   Of note prior to the patient's visit today, pt has had CT N/C/A/P completed on 03/19/18 with results revealing NECK: Multiple enlarged bilateral cervical lymph nodes consistent with the given diagnosis of CLL. The largest node is a right level 3 lymph node measuring 3.4 x 2.8 x 1.2 cm. 2. Degenerative changes of the cervical spine. 3. Hypodense lesion in the right side of the tongue  measuring up to 1.5 cm. This could represent focal infection. Neoplasm is considered less likely. The lesion appears to be superficial along the right lateral aspect of the anterior tongue a. This should be amenable to direct inspection, C/A/P: Marked adenopathy within the chest, abdomen, and pelvis, consistent with active lymphoma/leukemia. 2. Splenomegaly is nonspecific. Splenic involvement cannot be excluded. 3. Right greater than left and basilar predominant peribronchovascular interstitial thickening  and nodularity. Considerations include pulmonary involvement of lymphoma and/or Infection/aspiration. 4. Coronary artery atherosclerosis. Aortic Atherosclerosis. 5. Trace nonspecific pelvic fluid.  Most recent lab results (03/17/18) of CBC w/diff and CMP is as follows: all values are WNL except for WBC at 13.1k, RBC at 2.67, HGB at 10.1, HCT at 31.0, MCV at 116.1, MCH at 37.8, PLT at 103k, ANC at 900, Lymphs abs at 11.1k, BUN at 31, Calcium at 8.7, Total Protein at 6.2, Albumin at 3.4. 03/17/18 LDH at 241  On review of systems, pt reports new fatigue, recent fevers, recent night sweats, recently grown neck lymph nodes, eating well, some weight loss, right groin pain, and denies chills, SOB, ear pain, pain along the spine, frequent infections, flank pain, leg swelling, testicular pain or swelling, and any other symptoms.  Interval History:   Jake Kennedy returns today for management and evaluation of his CLL. The patient's last visit with [place] was on 07/24/18. The pt reports that he is doing well overall.  The pt reports that he has continued feeling tired and weak. He notes that he has continued coughing as well, which is worse in the mornings. He denies fevers, chills or night sweats. He feels that his cough has become "slightly better, more dry last two days," but "still mucousy." He feels some headache/sinus pressure. The pt notes that he coughs up "yellowish/green" mucous, occasionally clear. He has never been a smoker and denies asthma as well. He endorses significant second hand smoke exposure growing up as his father smoked in their home. He denies recent injuries to the chest.  He also endorses some abdominal discomfort which is limiting his appetite. Endorses sense of stomach fullness. He denies vomiting. He notes that his abdominal discomfort is new, and began in the last couple weeks, denies changes in bowel habits. The pt notes that eating increases his sense of fullness. He notes that the  fullness is intermittent, improves with bowel movements. Denies positional element to this.  He is continuing to stay hydrated and is urinating frequently in the night. The pt denies any problems with swallowing.  He has continued to hold Dexamethasone and Venetoclax in the interim. His last Gazyva infusion was two weeks ago.  Of note since the patient's last visit, pt has had a CT Neck completed on 08/11/18 with results revealing "Decreased cervical lymphadenopathy with all remaining nodes being subcentimeter in short axis. 2. Sinusitis."  The pt also had a 08/11/18 CT Chest which revealed "Interval improvement without resolution of the basilar predominant peripheral peribronchovascular nodularity. 2. Interval development of a new masslike consolidative opacity in the medial right lung measuring up to 3.3 cm. This may be infectious/inflammatory. Lymphoma involvement not excluded. 3. Interval resolution of splenomegaly with small volume perisplenic ascites identified today. 4.  Aortic Atherosclerosis."  Lab results today (08/13/18) of CBC w/diff and CMP is as follows: all values are WNL except for MCV at 104.7 RDW at 15.8, Lymphs abs at 500, Glucose at 132, Albumin at 3.4. 08/13/18 LDH is pending 08/13/18 Uric acid at 4.2  On review of  systems, pt reports continued cough, abdominal fullness, sinus pressure / headache, decreased appetite, fatigue, and denies vomiting, diarrhea, changes in bowel habits, difficulty swallowing, mouth sores, abdominal pain, leg swelling, and any other symptoms.    MEDICAL HISTORY:  Past Medical History:  Diagnosis Date   Basal cell carcinoma    moles removed in 2007 and 2012 from L nose and L hip   BPH (benign prostatic hypertrophy)    CAD (coronary artery disease), native coronary artery    a. 02/10/2015 95% mid LAD stenosis s/p DES  b. 07/15/16: LHC showed stable non obst dz and patent stent   CLL (chronic lymphocytic leukemia) ([place])    ED (erectile dysfunction)     Lymphocytosis 02/11/2015   seen by Dr. Lindi Adie 02/11/2015, working up for possible CLL    SURGICAL HISTORY: Past Surgical History:  Procedure Laterality Date   CARDIAC CATHETERIZATION  02/10/2015   CARDIAC CATHETERIZATION N/A 02/10/2015   Procedure: Left Heart Cath and Coronary Angiography;  Surgeon: Sherren Mocha, MD;  Location: [facility];  Service: Cardiovascular;  Laterality: N/A;   CARDIAC CATHETERIZATION N/A 02/10/2015   Procedure: Coronary Stent Intervention;  Surgeon: Sherren Mocha, MD;  Location: [facility];  Service: Cardiovascular;  Laterality: N/A;   CORONARY STENT PLACEMENT  02/10/2015   DES to LAD   LAMINECTOMY     LEFT HEART CATH AND CORONARY ANGIOGRAPHY N/A 07/15/2016   Procedure: Left Heart Cath and Coronary Angiography;  Surgeon: Martinique, Peter M, MD;  Location: [facility];  Service: Cardiovascular;  Laterality: N/A;   TONSILLECTOMY      SOCIAL HISTORY: Social History   Socioeconomic History   Marital status: Married    Spouse name: Not on file   Number of children: Not on file   Years of education: Not on file   Highest education level: Not on file  Occupational History   Not on file  Social Needs   Financial resource strain: Not on file   Food insecurity    Worry: Not on file    Inability: Not on file   Transportation needs    Medical: Not on file    Non-medical: Not on file  Tobacco Use   Smoking status: Never Smoker   Smokeless tobacco: Never Used  Substance and Sexual Activity   Alcohol use: Yes    Alcohol/week: 0.0 standard drinks    Comment: occasional   Drug use: No   Sexual activity: Not on file  Lifestyle   Physical activity    Days per week: Not on file    Minutes per session: Not on file   Stress: Not on file  Relationships   Social connections    Talks on phone: Not on file    Gets together: Not on file    Attends religious service: Not on file    Active member of club or organization: Not  on file    Attends meetings of clubs or organizations: Not on file    Relationship status: Not on file   Intimate partner violence    Fear of current or ex partner: Not on file    Emotionally abused: Not on file    Physically abused: Not on file    Forced sexual activity: Not on file  Other Topics Concern   Not on file  Social History Narrative   Not on file    FAMILY HISTORY: Family History  Problem Relation Age of Onset   Diabetes Mother  Heart failure Mother    Heart attack Mother        occured in 64s.    Healthy Sister    Healthy Brother    Healthy Maternal Grandmother    Healthy Maternal Grandfather    Healthy Paternal Grandmother    Healthy Paternal Grandfather    Healthy Brother     ALLERGIES:  is allergic to adhesive [tape] and latex.  MEDICATIONS:  Current Outpatient Medications  Medication Sig Dispense Refill   aspirin 81 MG tablet Take 81 mg by mouth daily.     atorvastatin (LIPITOR) 80 MG tablet Take 1 tablet (80 mg total) by mouth daily at 6 PM. 90 tablet 3   dexamethasone (DECADRON) 1 MG tablet 4mg  po BID for 2 days prior to each dose of Gazyva 60 tablet 0   dexamethasone (DECADRON) 1 MG tablet 2mg  (2 tabs) daily with breakfast for 3 days then 1mg  (1 tab) daily with breakfast. Continue pre-Gazyva dose as per previous instructions. 60 tablet 0   doxycycline (VIBRA-TABS) 100 MG tablet Take 1 tablet (100 mg total) by mouth 2 (two) times daily. 14 tablet 0   Multiple Vitamin (MULTIVITAMIN) tablet Take 1 tablet by mouth daily.     Multiple Vitamins-Minerals (PRESERVISION AREDS PO) Take 2 capsules by mouth daily.     nitroGLYCERIN (NITROSTAT) 0.4 MG SL tablet Place 1 tablet (0.4 mg total) under the tongue every 5 (five) minutes as needed for chest pain. 25 tablet 3   Omega-3 Fatty Acids (FISH OIL CONCENTRATE PO) Take 1 capsule by mouth daily.      ondansetron (ZOFRAN) 8 MG tablet Take 1 tablet (8 mg total) by mouth every 8 (eight) hours as  needed for nausea. 30 tablet 3   vardenafil (LEVITRA) 10 MG tablet Take 10 mg by mouth as directed.     VENCLEXTA 100 MG TABS TAKE 4 TABLETS (400 MG) BY MOUTH DAILY. TAKE WITH A MEAL AND A GLASS OF WATER AT APPROXIMATELY THE SAME TIME DAILY. 120 tablet 1   No current facility-administered medications for this visit.     REVIEW OF SYSTEMS:    A 10+ POINT REVIEW OF SYSTEMS WAS OBTAINED including neurology, dermatology, psychiatry, cardiac, respiratory, lymph, extremities, GI, GU, Musculoskeletal, constitutional, breasts, reproductive, HEENT.  All pertinent positives are noted in the HPI.  All others are negative.   PHYSICAL EXAMINATION: ECOG PERFORMANCE STATUS: 1 - Symptomatic but completely ambulatory  Vitals:   08/13/18 0922  BP: 127/69  Pulse: (!) 101  Resp: 18  Temp: 98.3 F (36.8 C)  SpO2: 97%   Filed Weights   08/13/18 0922  Weight: 162 lb (73.5 kg)   .Body mass index is 20.8 kg/m.   GENERAL:alert, in no acute distress and comfortable SKIN: no acute rashes, no significant lesions EYES: conjunctiva are pink and non-injected, sclera anicteric OROPHARYNX: MMM, no exudates, no oropharyngeal erythema or ulceration NECK: supple, no JVD LYMPH:  no palpable lymphadenopathy in the cervical, axillary or inguinal regions LUNGS: few crackles, right base HEART: regular rate & rhythm ABDOMEN:  normoactive bowel sounds , non tender, not distended. No palpable hepatosplenomegaly.  Extremity: no pedal edema PSYCH: alert & oriented x 3 with fluent speech NEURO: no focal motor/sensory deficits   LABORATORY DATA:  I have reviewed the data as listed  . CBC Latest Ref Rng & Units 08/13/2018 07/31/2018 07/24/2018  WBC 4.0 - 10.5 K/uL 8.5 5.4 2.9(L)  Hemoglobin 13.0 - 17.0 g/dL 14.1 11.7(L) 11.8(L)  Hematocrit 39.0 - 52.0 %  44.3 37.4(L) 37.9(L)  Platelets 150 - 400 K/uL 311 346 164  ANC 700<---600<---1200  . CMP Latest Ref Rng & Units 08/13/2018 07/31/2018 07/17/2018  Glucose 70 - 99  mg/dL 132(H) 156(H) 158(H)  BUN 8 - 23 mg/dL 18 22 26(H)  Creatinine 0.61 - 1.24 mg/dL 0.83 0.83 0.84  Sodium 135 - 145 mmol/L 140 140 139  Potassium 3.5 - 5.1 mmol/L 4.9 3.8 3.9  Chloride 98 - 111 mmol/L 101 103 101  CO2 22 - 32 mmol/L 28 26 25   Calcium 8.9 - 10.3 mg/dL 9.1 8.8(L) 8.6(L)  Total Protein 6.5 - 8.1 g/dL 6.6 6.4(L) 6.2(L)  Total Bilirubin 0.3 - 1.2 mg/dL 0.8 0.8 1.1  Alkaline Phos 38 - 126 U/L 85 70 65  AST 15 - 41 U/L 15 15 24   ALT 0 - 44 U/L 23 31 82(H)    04/30/18 Outside Labs:                RADIOGRAPHIC STUDIES: I have personally reviewed the radiological images as listed and agreed with the findings in the report. Ct Soft Tissue Neck W Contrast  Result Date: 08/11/2018 CLINICAL DATA:  Cough for 6 months. History of chronic lymphocytic leukemia. EXAM: CT NECK WITH CONTRAST TECHNIQUE: Multidetector CT imaging of the neck was performed using the standard protocol following the bolus administration of intravenous contrast. CONTRAST:  27mL OMNIPAQUE IOHEXOL 300 MG/ML  SOLN COMPARISON:  03/19/2018 FINDINGS: Pharynx and larynx: The right lateral oral tongue lesion on the prior CT has not significantly changed in size though demonstrates clear fat attenuation today and is benign in appearance. No pharyngeal mass is identified. The airway is patent. Salivary glands: No inflammation, mass, or stone. Thyroid: Unremarkable. Lymph nodes: Enlarged bilateral cervical lymph nodes on the prior study have all decreased in size with remaining nodes all being subcentimeter in short axis. For example, a left level IIa lymph node measures 9 mm in short axis (previously 15 mm), and a right level III lymph node measures 9 mm (previously 15 mm). Vascular: Unremarkable. Limited intracranial: Unremarkable. Visualized orbits: Unremarkable. Mastoids and visualized paranasal sinuses: Extensive ethmoid air cell opacification bilaterally. New circumferential mucosal thickening in the left  maxillary sinus. Partially visualized bilateral frontal and sphenoid sinus mucosal thickening. Clear mastoid air cells. Skeleton: Mild cervical spondylosis.  No suspicious osseous lesion. Upper chest: Reported separately. Other: None. IMPRESSION: 1. Decreased cervical lymphadenopathy with all remaining nodes being subcentimeter in short axis. 2. Sinusitis. Electronically Signed   By: Logan Bores M.D.   On: 08/11/2018 14:27   Ct Chest W Contrast  Result Date: 08/11/2018 CLINICAL DATA:  Persistent cough.  CLL. EXAM: CT CHEST WITH CONTRAST TECHNIQUE: Multidetector CT imaging of the chest was performed during intravenous contrast administration. CONTRAST:  25mL OMNIPAQUE IOHEXOL 300 MG/ML  SOLN COMPARISON:  03/19/2018 FINDINGS: Cardiovascular: The heart size is normal. No substantial pericardial effusion. Coronary artery calcification is evident. Atherosclerotic calcification is noted in the wall of the thoracic aorta. Mediastinum/Nodes: Mediastinal, hilar, and axillary lymphadenopathy again noted. Index left axillary node measured previously at 2.3 cm short axis is now 1.3 cm (18/3). Index precarinal node measured previously at 1.6 cm short axis is now 1.0 cm (26/3). Lungs/Pleura: The diffuse bronchial wall thickening with small airway impaction and peripheral tree-in-bud nodularity seen previously has improved. Nodular opacity in the medial left base measuring 18 mm on the prior study has decreased in the interval, now measuring 10 mm (136/6). Since the prior study, the patient has developed  a confluent masslike consolidative opacity in the medial right lower lobe measuring 3.3 x 1.9 cm on image 116/series 6. No pleural effusion. Upper Abdomen: Spleen has decreased in size in the interval. There is a small amount of perisplenic fluid. Musculoskeletal: No worrisome lytic or sclerotic osseous abnormality. IMPRESSION: 1. Interval improvement without resolution of the basilar predominant peripheral peribronchovascular  nodularity. 2. Interval development of a new masslike consolidative opacity in the medial right lung measuring up to 3.3 cm. This may be infectious/inflammatory. Lymphoma involvement not excluded. 3. Interval resolution of splenomegaly with small volume perisplenic ascites identified today. 4.  Aortic Atherosclerois (ICD10-170.0) Electronically Signed   By: Misty Stanley M.D.   On: 08/11/2018 15:38    ASSESSMENT & PLAN:  68 y.o. male with  1. Chronic Lymphocytic Leukemia Initial diagnosis on 02/13/15 Peripheral blood flow cytometry which revealed a Monoclonal B-cell population 09/25/15 Cytogenetics ruled out Mantle cell lymphoma. 12/25/17 FISH study ruled out translocation 11;14 12/30/17 BM Biopsy revealed CLL without evidence of transformation  03/17/18 FISH CLL Prognostic panel revealed an 11q deletion and Trisomy 12  03/19/18 CT N/C/A/P revealed NECK: Multiple enlarged bilateral cervical lymph nodes consistent with the given diagnosis of CLL. The largest node is a right level 3 lymph node measuring 3.4 x 2.8 x 1.2 cm. 2. Degenerative changes of the cervical spine. 3. Hypodense lesion in the right side of the tongue measuring up to 1.5 cm. This could represent focal infection. Neoplasm is considered less likely. The lesion appears to be superficial along the right lateral aspect of the anterior tongue a. This should be amenable to direct inspection, C/A/P: Marked adenopathy within the chest, abdomen, and pelvis, consistent with active lymphoma/leukemia. 2. Splenomegaly is nonspecific. Splenic involvement cannot be excluded. 3. Right greater than left and basilar predominant peribronchovascular interstitial thickening and nodularity. Considerations include pulmonary involvement of lymphoma and/or Infection/aspiration. 4. Coronary artery atherosclerosis. Aortic Atherosclerosis. 5. Trace nonspecific pelvic fluid.  03/26/18 Left cervical LN biopsy revealed SLL/CLL  PLAN: -Discussed pt labwork today,  08/13/18; HGB normalized to 14.1. WBC normal at 8.5k with ANC at 7.5k. Blood chemistries stable as well. LDH is pending. -Discussed the 08/11/18 CT Neck which revealed "Decreased cervical lymphadenopathy with all remaining nodes being subcentimeter in short axis. 2. Sinusitis." -Discussed the 08/11/18 CT Chest which revealed "Interval improvement without resolution of the basilar predominant peripheral peribronchovascular nodularity. 2. Interval development of a new masslike consolidative opacity in the medial right lung measuring up to 3.3 cm. This may be infectious/inflammatory. Lymphoma involvement not excluded. 3. Interval resolution of splenomegaly with small volume perisplenic ascites identified today. 4.  Aortic Atherosclerosis." -Allergic sinusitis felt more likely than infectious given pt's recent antibiotic courses. If persistent may need ENT evaluation. -Recommend saline sprays and distilled water inhalation -Cough as infectious vs inflammatory. Will order course of Levofloxacin and Flagyl -Discussed that I recommend further evaluation of his new masslike consolidative opacity in medial right lung. Will refer the pt to Pulmonology for consideration of bronchoscopy if felt necessary. -If worsening SOB or fevers, pt will seek medical attention -CLL is appropriately suppressed,  lymphadenopathy and splenomegaly have both resolved. At this time, given his other medical priorities, will focus on new lung finding and addressing his sinusitis. -Will continue holding CLL treatment at this time. Pt was scheduled for C2D1 Gazyva today, and will also continued holding 200mg  Venetoclax. -Given new stomach discomfort, will start short trial of antacids -Recommend cultured yogurt as well -Continue with distilled water steam inhalation -Demerol with  his pre-medications prior to [facility] infusion. Also two days of 4mg  Dexamethasone BID prior to subsequent infusions. -Pt is staying well hydrated, no indication for  IVF at this time -Recommended that the pt continue to eat well, drink at least 48-64 oz of water each day, and walk 20-30 minutes each day.  -Advised crowd avoidance, infection prevention strategies, and frequent hand washing. -Will see the pt back in one month, sooner if any new concerns   -Pulmonary urgent referral for rt lung mass/consolidation in immunosuppressed patient. -ENT for persistent pansinusitis in immunosuppressed patient. -Plz move appointments for 7/24 out 2 weeks for labs, MD and next dose of Gazyva   All of the patients questions were answered with apparent satisfaction. The patient knows to call the clinic with any problems, questions or concerns.  The total time spent in the appt was 30 minutes and more than 50% was on counseling and direct patient cares.    Sullivan Lone MD MS AAHIVMS [facility] Hematology/Oncology Physician [facility]  (Office):       [phone] (Work cell):  [phone] (Fax):           [phone]  08/13/2018 10:00 AM  I, Baldwin Jamaica, am acting as a scribe for Dr. Sullivan Lone.   .I have reviewed the above documentation for accuracy and completeness, and I agree with the above. Brunetta Genera MD

## 2018-08-13 ENCOUNTER — Inpatient Hospital Stay (HOSPITAL_BASED_OUTPATIENT_CLINIC_OR_DEPARTMENT_OTHER): Payer: BC Managed Care – PPO | Admitting: Hematology

## 2018-08-13 ENCOUNTER — Other Ambulatory Visit: Payer: Self-pay

## 2018-08-13 ENCOUNTER — Inpatient Hospital Stay: Payer: BC Managed Care – PPO | Attending: Hematology and Oncology

## 2018-08-13 VITALS — BP 127/69 | HR 101 | Temp 98.3°F | Resp 18 | Ht 74.0 in | Wt 162.0 lb

## 2018-08-13 DIAGNOSIS — J189 Pneumonia, unspecified organism: Secondary | ICD-10-CM

## 2018-08-13 DIAGNOSIS — N400 Enlarged prostate without lower urinary tract symptoms: Secondary | ICD-10-CM | POA: Insufficient documentation

## 2018-08-13 DIAGNOSIS — R161 Splenomegaly, not elsewhere classified: Secondary | ICD-10-CM | POA: Insufficient documentation

## 2018-08-13 DIAGNOSIS — Z79899 Other long term (current) drug therapy: Secondary | ICD-10-CM

## 2018-08-13 DIAGNOSIS — K149 Disease of tongue, unspecified: Secondary | ICD-10-CM

## 2018-08-13 DIAGNOSIS — Z7982 Long term (current) use of aspirin: Secondary | ICD-10-CM

## 2018-08-13 DIAGNOSIS — C9110 Chronic lymphocytic leukemia of B-cell type not having achieved remission: Secondary | ICD-10-CM | POA: Diagnosis not present

## 2018-08-13 DIAGNOSIS — I2510 Atherosclerotic heart disease of native coronary artery without angina pectoris: Secondary | ICD-10-CM | POA: Diagnosis not present

## 2018-08-13 DIAGNOSIS — Z85828 Personal history of other malignant neoplasm of skin: Secondary | ICD-10-CM | POA: Diagnosis not present

## 2018-08-13 DIAGNOSIS — R918 Other nonspecific abnormal finding of lung field: Secondary | ICD-10-CM

## 2018-08-13 DIAGNOSIS — N529 Male erectile dysfunction, unspecified: Secondary | ICD-10-CM | POA: Diagnosis not present

## 2018-08-13 DIAGNOSIS — J40 Bronchitis, not specified as acute or chronic: Secondary | ICD-10-CM

## 2018-08-13 DIAGNOSIS — J0140 Acute pansinusitis, unspecified: Secondary | ICD-10-CM

## 2018-08-13 DIAGNOSIS — I700 Atherosclerosis of aorta: Secondary | ICD-10-CM | POA: Insufficient documentation

## 2018-08-13 LAB — CMP (CANCER CENTER ONLY)
ALT: 23 U/L (ref 0–44)
AST: 15 U/L (ref 15–41)
Albumin: 3.4 g/dL — ABNORMAL LOW (ref 3.5–5.0)
Alkaline Phosphatase: 85 U/L (ref 38–126)
Anion gap: 11 (ref 5–15)
BUN: 18 mg/dL (ref 8–23)
CO2: 28 mmol/L (ref 22–32)
Calcium: 9.1 mg/dL (ref 8.9–10.3)
Chloride: 101 mmol/L (ref 98–111)
Creatinine: 0.83 mg/dL (ref 0.61–1.24)
GFR, Est AFR Am: >60 mL/min (ref >60)
GFR, Estimated: >60 mL/min (ref >60)
Glucose, Bld: 132 mg/dL — ABNORMAL HIGH (ref 70–99)
Potassium: 4.9 mmol/L (ref 3.5–5.1)
Sodium: 140 mmol/L (ref 135–145)
Total Bilirubin: 0.8 mg/dL (ref 0.3–1.2)
Total Protein: 6.6 g/dL (ref 6.5–8.1)

## 2018-08-13 LAB — CBC WITH DIFFERENTIAL/PLATELET
Abs Immature Granulocytes: 0.05 10*3/uL (ref 0.00–0.07)
Basophils Absolute: 0.1 10*3/uL (ref 0.0–0.1)
Basophils Relative: 1 %
Eosinophils Absolute: 0.2 10*3/uL (ref 0.0–0.5)
Eosinophils Relative: 3 %
HCT: 44.3 % (ref 39.0–52.0)
Hemoglobin: 14.1 g/dL (ref 13.0–17.0)
Immature Granulocytes: 1 %
Lymphocytes Relative: 6 %
Lymphs Abs: 0.5 10*3/uL — ABNORMAL LOW (ref 0.7–4.0)
MCH: 33.3 pg (ref 26.0–34.0)
MCHC: 31.8 g/dL (ref 30.0–36.0)
MCV: 104.7 fL — ABNORMAL HIGH (ref 80.0–100.0)
Monocytes Absolute: 0.2 10*3/uL (ref 0.1–1.0)
Monocytes Relative: 2 %
Neutro Abs: 7.5 10*3/uL (ref 1.7–7.7)
Neutrophils Relative %: 87 %
Platelets: 311 10*3/uL (ref 150–400)
RBC: 4.23 MIL/uL (ref 4.22–5.81)
RDW: 15.8 % — ABNORMAL HIGH (ref 11.5–15.5)
WBC: 8.5 10*3/uL (ref 4.0–10.5)
nRBC: 0 % (ref 0.0–0.2)

## 2018-08-13 LAB — URIC ACID: Uric Acid, Serum: 4.2 mg/dL (ref 3.7–8.6)

## 2018-08-13 LAB — LACTATE DEHYDROGENASE: LDH: 231 U/L — ABNORMAL HIGH (ref 98–192)

## 2018-08-13 MED ORDER — LEVOFLOXACIN 750 MG PO TABS: 750.0000 mg | ORAL_TABLET | Freq: Every day | ORAL | 0 refills | Status: AC

## 2018-08-14 ENCOUNTER — Ambulatory Visit: Payer: BC Managed Care – PPO | Admitting: Hematology

## 2018-08-14 ENCOUNTER — Other Ambulatory Visit: Payer: BC Managed Care – PPO

## 2018-08-26 ENCOUNTER — Encounter: Payer: Self-pay | Admitting: Hematology

## 2018-08-26 DIAGNOSIS — J329 Chronic sinusitis, unspecified: Secondary | ICD-10-CM | POA: Diagnosis not present

## 2018-08-26 DIAGNOSIS — H66002 Acute suppurative otitis media without spontaneous rupture of ear drum, left ear: Secondary | ICD-10-CM | POA: Diagnosis not present

## 2018-08-28 ENCOUNTER — Ambulatory Visit: Payer: BC Managed Care – PPO

## 2018-08-28 ENCOUNTER — Other Ambulatory Visit: Payer: BC Managed Care – PPO

## 2018-08-28 ENCOUNTER — Ambulatory Visit: Payer: BC Managed Care – PPO | Admitting: Hematology

## 2018-09-04 ENCOUNTER — Telehealth: Payer: Self-pay | Admitting: Hematology

## 2018-09-04 NOTE — Telephone Encounter (Signed)
[facility] CME 8/7 lab/fu moved to 8/6, keep tx 8/7. Confirmed with patient.

## 2018-09-07 ENCOUNTER — Other Ambulatory Visit: Payer: Self-pay

## 2018-09-07 ENCOUNTER — Encounter: Payer: Self-pay | Admitting: Emergency Medicine

## 2018-09-07 ENCOUNTER — Ambulatory Visit (INDEPENDENT_AMBULATORY_CARE_PROVIDER_SITE_OTHER): Payer: BC Managed Care – PPO | Admitting: Emergency Medicine

## 2018-09-07 VITALS — BP 114/78 | HR 78 | Ht 74.0 in | Wt 159.0 lb

## 2018-09-07 DIAGNOSIS — R9389 Abnormal findings on diagnostic imaging of other specified body structures: Secondary | ICD-10-CM

## 2018-09-07 DIAGNOSIS — R918 Other nonspecific abnormal finding of lung field: Secondary | ICD-10-CM | POA: Diagnosis not present

## 2018-09-07 NOTE — Patient Instructions (Signed)
We reviewed your CT scans of the chest in detail today. We will arrange for navigational bronchoscopy to better evaluate your right lung abnormality, to perform culture information.  We will check the OR schedule and arrange for this soon as possible.  We will call you to discuss the details. Follow with Dr Lamonte Sakai in 1 month

## 2018-09-07 NOTE — Assessment & Plan Note (Signed)
Interesting case of abnormal CT scan of the chest that actually had scattered micro and mixed size nodular disease, base predominant, that responded to chemotherapy for his CLL.  Suspect that this reflected pulmonary lymphadenopathy.  Now he has a different lesion, more contiguous and masslike, irregular in the medial right lower lobe.  It does not look like an infectious process at least radiographically although this is possible.  I am concerned about lymphoma or possibly the same process that was treated originally (but with just a different appearance).  I recommended navigational bronchoscopy and BAL to evaluate further.  We will work on scheduling this for him.  We reviewed your CT scans of the chest in detail today. We will arrange for navigational bronchoscopy to better evaluate your right lung abnormality, to perform culture information.  We will check the OR schedule and arrange for this soon as possible.  We will call you to discuss the details. Follow with Dr Lamonte Sakai in 1 month

## 2018-09-07 NOTE — H&P (View-Only) (Signed)
Subjective:    Patient ID: Jake Kennedy, male    DOB: 1950/11/05, 68 y.o.   MRN: [id number]  HPI 68 year old never smoker with a history of coronary disease, CLL.  Underwent chemotherapy with dexamethasone, Veneclax, but had to be stopped due to leukopenia. Then started obinutuzumab by Dr. Irene Limbo in June 2020. He has had a return of weakness, dyspnea, LAD, cough, wt loss. He is bringing up yellowish mucous, coughs every morning.   He is referred today for evaluation of an persistently abnormal CT chest.   CT chest on 7/7 was reviewed by me, shows a medial right lower lobe paraspinal opacity with an irregular border, diffuse bronchial wall thickening and some mild bronchiectatic change.  This is compared with a CT chest from 03/19/2018 and shows an interval improvement in base predominant peripheral peribronchovascular nodularity.  CT neck showed decreased cervical lymphadenopathy.   Review of Systems  Constitutional: Positive for fatigue. Negative for fever and unexpected weight change.  HENT: Negative for congestion, dental problem, ear pain, nosebleeds, postnasal drip, rhinorrhea, sinus pressure, sneezing, sore throat and trouble swallowing.   Eyes: Negative for redness and itching.  Respiratory: Negative for cough, chest tightness, shortness of breath and wheezing.   Cardiovascular: Negative for palpitations and leg swelling.  Gastrointestinal: Negative for nausea and vomiting.  Genitourinary: Negative for dysuria.  Musculoskeletal: Negative for joint swelling.  Skin: Negative for rash.  Neurological: Negative for headaches.  Hematological: Does not bruise/bleed easily.  Psychiatric/Behavioral: Negative for dysphoric mood. The patient is not nervous/anxious.     Past Medical History:  Diagnosis Date  . Basal cell carcinoma    moles removed in 2007 and 2012 from L nose and L hip  . BPH (benign prostatic hypertrophy)   . CAD (coronary artery disease), native coronary artery    a.  02/10/2015 95% mid LAD stenosis s/p DES  b. 07/15/16: LHC showed stable non obst dz and patent stent  . CLL (chronic lymphocytic leukemia) ([place])   . ED (erectile dysfunction)   . Lymphocytosis 02/11/2015   seen by Dr. Lindi Adie 02/11/2015, working up for possible CLL     Family History  Problem Relation Age of Onset  . Diabetes Mother   . Heart failure Mother   . Heart attack Mother        occured in 18s.   . Healthy Sister   . Healthy Brother   . Healthy Maternal Grandmother   . Healthy Maternal Grandfather   . Healthy Paternal Grandmother   . Healthy Paternal Grandfather   . Healthy Brother      Social History   Socioeconomic History  . Marital status: Married    Spouse name: Not on file  . Number of children: Not on file  . Years of education: Not on file  . Highest education level: Not on file  Occupational History  . Not on file  Social Needs  . Financial resource strain: Not on file  . Food insecurity    Worry: Not on file    Inability: Not on file  . Transportation needs    Medical: Not on file    Non-medical: Not on file  Tobacco Use  . Smoking status: Never Smoker  . Smokeless tobacco: Never Used  Substance and Sexual Activity  . Alcohol use: Yes    Alcohol/week: 0.0 standard drinks    Comment: occasional  . Drug use: No  . Sexual activity: Not on file  Lifestyle  . Physical activity  Days per week: Not on file    Minutes per session: Not on file  . Stress: Not on file  Relationships  . Social Herbalist on phone: Not on file    Gets together: Not on file    Attends religious service: Not on file    Active member of club or organization: Not on file    Attends meetings of clubs or organizations: Not on file    Relationship status: Not on file  . Intimate partner violence    Fear of current or ex partner: Not on file    Emotionally abused: Not on file    Physically abused: Not on file    Forced sexual activity: Not on file  Other Topics  Concern  . Not on file  Social History Narrative  . 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   Allergies  Allergen Reactions  . Adhesive [Tape]     Blisters & hives - Paper tape ok  . Latex Itching    Blisters and itching     Outpatient Medications Prior to Visit  Medication Sig Dispense Refill  . amoxicillin-clavulanate (AUGMENTIN) 875-125 MG tablet Take 1 tablet by mouth 2 (two) times daily.    Marland Kitchen aspirin 81 MG tablet Take 81 mg by mouth daily.    Marland Kitchen atorvastatin (LIPITOR) 80 MG tablet Take 1 tablet (80 mg total) by mouth daily at 6 PM. 90 tablet 3  . dexamethasone (DECADRON) 1 MG tablet 63m po BID for 2 days prior to each dose of Gazyva 60 tablet 0  . dexamethasone (DECADRON) 1 MG tablet 296m(2 tabs) daily with breakfast for 3 days then 70m79m1 tab) daily with breakfast. Continue pre-Gazyva dose as per previous instructions. 60 tablet 0  . Multiple Vitamin (MULTIVITAMIN) tablet Take 1 tablet by mouth daily.    . Multiple Vitamins-Minerals (PRESERVISION AREDS PO) Take 2 capsules by mouth daily.    . nitroGLYCERIN (NITROSTAT) 0.4 MG SL tablet Place 1 tablet (0.4 mg total) under the tongue every 5 (five) minutes as needed for chest pain. 25 tablet 3  . Omega-3 Fatty Acids (FISH OIL CONCENTRATE PO) Take 1 capsule by mouth daily.     . ondansetron (ZOFRAN) 8 MG tablet Take 1 tablet (8 mg total) by mouth every 8 (eight) hours as needed for nausea. 30 tablet 3  . vardenafil (LEVITRA) 10 MG tablet Take 10 mg by mouth as directed.    . VENCLEXTA 100 MG TABS TAKE 4 TABLETS (400 MG) BY MOUTH DAILY. TAKE WITH A MEAL AND A GLASS OF WATER AT APPROXIMATELY THE SAME TIME DAILY. 120 tablet 1  . doxycycline (VIBRA-TABS) 100 MG tablet Take 1 tablet (100 mg total) by mouth 2 (two) times daily. 14 tablet 0   No facility-administered medications prior to visit.         Objective:   Physical Exam Vitals:   09/07/18 1528  BP: 114/78  Pulse: 78  SpO2: 100%   Weight: 159 lb (72.1 kg)  Height: _0  (1.88 m)   Gen: Pleasant, tall and thin, in no distress,  normal affect  ENT: No lesions,  mouth clear,  oropharynx clear, no postnasal drip  Neck: No JVD, no stridor  Lungs: No use of accessory muscles, no crackles or wheezing on normal respiration, no wheeze on forced expiration  Cardiovascular: RRR, heart sounds normal, no murmur or gallops, no peripheral edema  Musculoskeletal: No  deformities, no cyanosis or clubbing  Neuro: alert, awake, non focal  Skin: Warm, no lesions or rash      Assessment & Plan:  Abnormal CT of the chest Interesting case of abnormal CT scan of the chest that actually had scattered micro and mixed size nodular disease, base predominant, that responded to chemotherapy for his CLL.  Suspect that this reflected pulmonary lymphadenopathy.  Now he has a different lesion, more contiguous and masslike, irregular in the medial right lower lobe.  It does not look like an infectious process at least radiographically although this is possible.  I am concerned about lymphoma or possibly the same process that was treated originally (but with just a different appearance).  I recommended navigational bronchoscopy and BAL to evaluate further.  We will work on scheduling this for him.  We reviewed your CT scans of the chest in detail today. We will arrange for navigational bronchoscopy to better evaluate your right lung abnormality, to perform culture information.  We will check the OR schedule and arrange for this soon as possible.  We will call you to discuss the details. Follow with Dr Lamonte Sakai in 1 month  [name], MD, PhD 09/07/2018, 5:11 PM [facility] [phone] or if no answer [phone]

## 2018-09-07 NOTE — Progress Notes (Signed)
 Subjective:    Patient ID: Jake Kennedy, male    DOB: 03/19/1950, 68 y.o.   MRN: [id number]  HPI 68-year-old never smoker with a history of coronary disease, CLL.  Underwent chemotherapy with dexamethasone, Veneclax, but had to be stopped due to leukopenia. Then started obinutuzumab by Dr. Kale in June 2020. He has had a return of weakness, dyspnea, LAD, cough, wt loss. He is bringing up yellowish mucous, coughs every morning.   He is referred today for evaluation of an persistently abnormal CT chest.   CT chest on 7/7 was reviewed by me, shows a medial right lower lobe paraspinal opacity with an irregular border, diffuse bronchial wall thickening and some mild bronchiectatic change.  This is compared with a CT chest from 03/19/2018 and shows an interval improvement in base predominant peripheral peribronchovascular nodularity.  CT neck showed decreased cervical lymphadenopathy.   Review of Systems  Constitutional: Positive for fatigue. Negative for fever and unexpected weight change.  HENT: Negative for congestion, dental problem, ear pain, nosebleeds, postnasal drip, rhinorrhea, sinus pressure, sneezing, sore throat and trouble swallowing.   Eyes: Negative for redness and itching.  Respiratory: Negative for cough, chest tightness, shortness of breath and wheezing.   Cardiovascular: Negative for palpitations and leg swelling.  Gastrointestinal: Negative for nausea and vomiting.  Genitourinary: Negative for dysuria.  Musculoskeletal: Negative for joint swelling.  Skin: Negative for rash.  Neurological: Negative for headaches.  Hematological: Does not bruise/bleed easily.  Psychiatric/Behavioral: Negative for dysphoric mood. The patient is not nervous/anxious.     Past Medical History:  Diagnosis Date  . Basal cell carcinoma    moles removed in 2007 and 2012 from L nose and L hip  . BPH (benign prostatic hypertrophy)   . CAD (coronary artery disease), native coronary artery    a.  02/10/2015 95% mid LAD stenosis s/p DES  b. 07/15/16: LHC showed stable non obst dz and patent stent  . CLL (chronic lymphocytic leukemia) ([facility])   . ED (erectile dysfunction)   . Lymphocytosis 02/11/2015   seen by Dr. Gudena 02/11/2015, working up for possible CLL     Family History  Problem Relation Age of Onset  . Diabetes Mother   . Heart failure Mother   . Heart attack Mother        occured in 70s.   . Healthy Sister   . Healthy Brother   . Healthy Maternal Grandmother   . Healthy Maternal Grandfather   . Healthy Paternal Grandmother   . Healthy Paternal Grandfather   . Healthy Brother      Social History   Socioeconomic History  . Marital status: Married    Spouse name: Not on file  . Number of children: Not on file  . Years of education: Not on file  . Highest education level: Not on file  Occupational History  . Not on file  Social Needs  . Financial resource strain: Not on file  . Food insecurity    Worry: Not on file    Inability: Not on file  . Transportation needs    Medical: Not on file    Non-medical: Not on file  Tobacco Use  . Smoking status: Never Smoker  . Smokeless tobacco: Never Used  Substance and Sexual Activity  . Alcohol use: Yes    Alcohol/week: 0.0 standard drinks    Comment: occasional  . Drug use: No  . Sexual activity: Not on file  Lifestyle  . Physical activity      Days per week: Not on file    Minutes per session: Not on file  . Stress: Not on file  Relationships  . Social Herbalist on phone: Not on file    Gets together: Not on file    Attends religious service: Not on file    Active member of club or organization: Not on file    Attends meetings of clubs or organizations: Not on file    Relationship status: Not on file  . Intimate partner violence    Fear of current or ex partner: Not on file    Emotionally abused: Not on file    Physically abused: Not on file    Forced sexual activity: Not on file  Other Topics  Concern  . Not on file  Social History Narrative  . 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   Allergies  Allergen Reactions  . Adhesive [Tape]     Blisters & hives - Paper tape ok  . Latex Itching    Blisters and itching     Outpatient Medications Prior to Visit  Medication Sig Dispense Refill  . amoxicillin-clavulanate (AUGMENTIN) 875-125 MG tablet Take 1 tablet by mouth 2 (two) times daily.    Marland Kitchen aspirin 81 MG tablet Take 81 mg by mouth daily.    Marland Kitchen atorvastatin (LIPITOR) 80 MG tablet Take 1 tablet (80 mg total) by mouth daily at 6 PM. 90 tablet 3  . dexamethasone (DECADRON) 1 MG tablet 46m po BID for 2 days prior to each dose of Gazyva 60 tablet 0  . dexamethasone (DECADRON) 1 MG tablet 27m(2 tabs) daily with breakfast for 3 days then 82m60m1 tab) daily with breakfast. Continue pre-Gazyva dose as per previous instructions. 60 tablet 0  . Multiple Vitamin (MULTIVITAMIN) tablet Take 1 tablet by mouth daily.    . Multiple Vitamins-Minerals (PRESERVISION AREDS PO) Take 2 capsules by mouth daily.    . nitroGLYCERIN (NITROSTAT) 0.4 MG SL tablet Place 1 tablet (0.4 mg total) under the tongue every 5 (five) minutes as needed for chest pain. 25 tablet 3  . Omega-3 Fatty Acids (FISH OIL CONCENTRATE PO) Take 1 capsule by mouth daily.     . ondansetron (ZOFRAN) 8 MG tablet Take 1 tablet (8 mg total) by mouth every 8 (eight) hours as needed for nausea. 30 tablet 3  . vardenafil (LEVITRA) 10 MG tablet Take 10 mg by mouth as directed.    . VENCLEXTA 100 MG TABS TAKE 4 TABLETS (400 MG) BY MOUTH DAILY. TAKE WITH A MEAL AND A GLASS OF WATER AT APPROXIMATELY THE SAME TIME DAILY. 120 tablet 1  . doxycycline (VIBRA-TABS) 100 MG tablet Take 1 tablet (100 mg total) by mouth 2 (two) times daily. 14 tablet 0   No facility-administered medications prior to visit.         Objective:   Physical Exam Vitals:   09/07/18 1528  BP: 114/78  Pulse: 78  SpO2: 100%   Weight: 159 lb (72.1 kg)  Height: _0  (1.88 m)   Gen: Pleasant, tall and thin, in no distress,  normal affect  ENT: No lesions,  mouth clear,  oropharynx clear, no postnasal drip  Neck: No JVD, no stridor  Lungs: No use of accessory muscles, no crackles or wheezing on normal respiration, no wheeze on forced expiration  Cardiovascular: RRR, heart sounds normal, no murmur or gallops, no peripheral edema  Musculoskeletal: No  deformities, no cyanosis or clubbing  Neuro: alert, awake, non focal  Skin: Warm, no lesions or rash      Assessment & Plan:  Abnormal CT of the chest Interesting case of abnormal CT scan of the chest that actually had scattered micro and mixed size nodular disease, base predominant, that responded to chemotherapy for his CLL.  Suspect that this reflected pulmonary lymphadenopathy.  Now he has a different lesion, more contiguous and masslike, irregular in the medial right lower lobe.  It does not look like an infectious process at least radiographically although this is possible.  I am concerned about lymphoma or possibly the same process that was treated originally (but with just a different appearance).  I recommended navigational bronchoscopy and BAL to evaluate further.  We will work on scheduling this for him.  We reviewed your CT scans of the chest in detail today. We will arrange for navigational bronchoscopy to better evaluate your right lung abnormality, to perform culture information.  We will check the OR schedule and arrange for this soon as possible.  We will call you to discuss the details. Follow with Dr Juriel Cid in 1 month  Darrelle Wiberg, MD, PhD 09/07/2018, 5:11 PM [facility] [phone] or if no answer [phone]  

## 2018-09-08 ENCOUNTER — Encounter: Payer: Self-pay | Admitting: Hematology

## 2018-09-08 DIAGNOSIS — J324 Chronic pansinusitis: Secondary | ICD-10-CM | POA: Diagnosis not present

## 2018-09-08 DIAGNOSIS — R918 Other nonspecific abnormal finding of lung field: Secondary | ICD-10-CM | POA: Diagnosis not present

## 2018-09-08 DIAGNOSIS — R05 Cough: Secondary | ICD-10-CM | POA: Diagnosis not present

## 2018-09-08 DIAGNOSIS — C9110 Chronic lymphocytic leukemia of B-cell type not having achieved remission: Secondary | ICD-10-CM | POA: Diagnosis not present

## 2018-09-09 ENCOUNTER — Other Ambulatory Visit: Payer: Self-pay | Admitting: Emergency Medicine

## 2018-09-09 NOTE — Progress Notes (Signed)
HEMATOLOGY/ONCOLOGY CLINIC NOTE  Date of Service: 09/10/2018  Patient Care Team: [name], MD as PCP - General (Internal Medicine) Sherren Mocha, MD as PCP - Cardiology (Cardiology)  CHIEF COMPLAINTS/PURPOSE OF CONSULTATION:  Chronic Lymphocytic Leukemia  Oncologic History:   Oncology History  Chronic lymphocytic leukemia (CLL), B-cell ([place])  02/14/2015 Initial Diagnosis   Monoclonal B-cell population CD5 and CD20 positive, differential diagnosis CLL versus mantle cell lymphoma ( patient had absolute lymphocyte count of 8000 in 2010), remained stable.   07/09/2018 -  Chemotherapy   The patient had obinutuzumab (GAZYVA) 100 mg in sodium chloride 0.9 % 100 mL (0.9615 mg/mL) chemo infusion, 100 mg, Intravenous, Once, 2 of 6 cycles Administration: 100 mg (07/09/2018), 900 mg (07/10/2018), 1,000 mg (07/31/2018)  for chemotherapy treatment.       HISTORY OF PRESENTING ILLNESS:  Jake Kennedy is a wonderful 68 y.o. male who has been referred to [place] by Dr. [name] for evaluation and management of Chronic Lymphocytic Leukemia. The pt was formerly under the care of my colleague Dr. Nicholas Lose. He is accompanied today by his wife. The pt reports that he is doing well overall.  The pt reports that his course of CLL had been pretty consistent since diagnosis in 2017, until roughly 3 months ago in roughly November 2019. He notes that he has had recent increase in fatigue in the last 3 months, and more acutely in the last 3 weeks. He endorses occasional fevers over the last two weeks during the night, highest at 100.2 or 100.3. He also endorses some night sweats, not every night, some of these being "drenching." He notes that he has been eating well for the last 6 months, however has lost 8 pounds in that interim. He endorses regular exercising until the past month, due to his worsened fatigue, and had previously regularly run upwards of 3 miles and bike for several miles as well.  The pt  notes that he can't smell very well, in the last 2-3 weeks, and his hearing has also reduced in the last week. He senses that his enlarged lymph nodes are pressing on his neck, and characterizes this as similar to when one has an acute cold and congestion. He had acute pain in his right hip a week ago and saw my colleague Sandi Mealy, PA in Symptom Managment. He was given Norco but denies taking this because he didn't want to. He endorses a positional element to his hip pain, which e attributes to the pain being from an enlarged lymph node. He notes that his hip pain had sudden onset on the night of 03/18/18. He notes that his neck lymph nodes have grown in the last 3 weeks to a month. The pt notes that his lymph nodes in his neck, armpits, and groin have been enlarged.  The pt tried an antibiotic course 2 weeks ago with his PCP for a suspected sinus infection, during which his cervical lymph nodes enlarged further. He notes that his neck lymph nodes are somewhat improved today as compared to this. The pt denies frequent infections.   Of note prior to the patient's visit today, pt has had CT N/C/A/P completed on 03/19/18 with results revealing NECK: Multiple enlarged bilateral cervical lymph nodes consistent with the given diagnosis of CLL. The largest node is a right level 3 lymph node measuring 3.4 x 2.8 x 1.2 cm. 2. Degenerative changes of the cervical spine. 3. Hypodense lesion in the right side of the tongue  measuring up to 1.5 cm. This could represent focal infection. Neoplasm is considered less likely. The lesion appears to be superficial along the right lateral aspect of the anterior tongue a. This should be amenable to direct inspection, C/A/P: Marked adenopathy within the chest, abdomen, and pelvis, consistent with active lymphoma/leukemia. 2. Splenomegaly is nonspecific. Splenic involvement cannot be excluded. 3. Right greater than left and basilar predominant peribronchovascular interstitial thickening  and nodularity. Considerations include pulmonary involvement of lymphoma and/or Infection/aspiration. 4. Coronary artery atherosclerosis. Aortic Atherosclerosis. 5. Trace nonspecific pelvic fluid.  Most recent lab results (03/17/18) of CBC w/diff and CMP is as follows: all values are WNL except for WBC at 13.1k, RBC at 2.67, HGB at 10.1, HCT at 31.0, MCV at 116.1, MCH at 37.8, PLT at 103k, ANC at 900, Lymphs abs at 11.1k, BUN at 31, Calcium at 8.7, Total Protein at 6.2, Albumin at 3.4. 03/17/18 LDH at 241  On review of systems, pt reports new fatigue, recent fevers, recent night sweats, recently grown neck lymph nodes, eating well, some weight loss, right groin pain, and denies chills, SOB, ear pain, pain along the spine, frequent infections, flank pain, leg swelling, testicular pain or swelling, and any other symptoms.  Interval History:   Jake Kennedy returns today for management and evaluation of his CLL. The patient's last visit with [place] was on 08/13/2018. The pt reports that he is doing well overall.  The pt reports that he saw Dr. Lucia Gaskins for his sinus symptoms and was prescribed Augmentin and Prednisone x6 days. He reports that his ear pain went away, but he now feels "stuffy."   He reports that he is feeling more tired and has a worsening cough. He coughs up a yellowish-tan phlegm, mostly in the morning. Denies fevers, chills, and SOB. He also notes some stomach pain.  He goes for a 30 min walk every evening, which he can do without becoming short of breath. He is losing some weight and his appetite is not improving. He does eat around 2300 calories/day.  His pulmonologist did not know that the mass in his lung was infection and recommended a bronchoscopy, which will be completed on 09/16/2018  Lab results today (09/10/2018) of CBC w/diff and CMP is as follows: all values are WNL except for RDW at 15.9, neutro abs at 7.8k, lymphs abs at 0.5k, glucose bld at 110, total protein at 5.9, Albumin  at 3.4, AST at 13.  On review of systems, pt reports coughing up phlegm, stomach pain, unintentional weight loss, poor appetite, feeling "stuffy" and denies fever, chills, SOB, diarrhea and any other symptoms.   MEDICAL HISTORY:  Past Medical History:  Diagnosis Date   Basal cell carcinoma    moles removed in 2007 and 2012 from L nose and L hip   BPH (benign prostatic hypertrophy)    CAD (coronary artery disease), native coronary artery    a. 02/10/2015 95% mid LAD stenosis s/p DES  b. 07/15/16: LHC showed stable non obst dz and patent stent   CLL (chronic lymphocytic leukemia) ([place])    ED (erectile dysfunction)    Lymphocytosis 02/11/2015   seen by Dr. Lindi Adie 02/11/2015, working up for possible CLL    SURGICAL HISTORY: Past Surgical History:  Procedure Laterality Date   CARDIAC CATHETERIZATION  02/10/2015   CARDIAC CATHETERIZATION N/A 02/10/2015   Procedure: Left Heart Cath and Coronary Angiography;  Surgeon: Sherren Mocha, MD;  Location: [facility];  Service: Cardiovascular;  Laterality: N/A;  CARDIAC CATHETERIZATION N/A 02/10/2015   Procedure: Coronary Stent Intervention;  Surgeon: Sherren Mocha, MD;  Location: [facility];  Service: Cardiovascular;  Laterality: N/A;   CORONARY STENT PLACEMENT  02/10/2015   DES to LAD   LAMINECTOMY     LEFT HEART CATH AND CORONARY ANGIOGRAPHY N/A 07/15/2016   Procedure: Left Heart Cath and Coronary Angiography;  Surgeon: Martinique, Peter M, MD;  Location: [facility];  Service: Cardiovascular;  Laterality: N/A;   TONSILLECTOMY      SOCIAL HISTORY: Social History   Socioeconomic History   Marital status: Married    Spouse name: Not on file   Number of children: Not on file   Years of education: Not on file   Highest education level: Not on file  Occupational History   Not on file  Social Needs   Financial resource strain: Not on file   Food insecurity    Worry: Not on file    Inability: Not on file    Transportation needs    Medical: Not on file    Non-medical: Not on file  Tobacco Use   Smoking status: Never Smoker   Smokeless tobacco: Never Used  Substance and Sexual Activity   Alcohol use: Yes    Alcohol/week: 0.0 standard drinks    Comment: occasional   Drug use: No   Sexual activity: Not on file  Lifestyle   Physical activity    Days per week: Not on file    Minutes per session: Not on file   Stress: Not on file  Relationships   Social connections    Talks on phone: Not on file    Gets together: Not on file    Attends religious service: Not on file    Active member of club or organization: Not on file    Attends meetings of clubs or organizations: Not on file    Relationship status: Not on file   Intimate partner violence    Fear of current or ex partner: Not on file    Emotionally abused: Not on file    Physically abused: Not on file    Forced sexual activity: Not on file  Other Topics Concern   Not on file  Social History Narrative   Not on file    FAMILY HISTORY: Family History  Problem Relation Age of Onset   Diabetes Mother    Heart failure Mother    Heart attack Mother        occured in 38s.    Healthy Sister    Healthy Brother    Healthy Maternal Grandmother    Healthy Maternal Grandfather    Healthy Paternal Grandmother    Healthy Paternal Grandfather    Healthy Brother     ALLERGIES:  is allergic to adhesive [tape] and latex.  MEDICATIONS:  Current Outpatient Medications  Medication Sig Dispense Refill   aspirin 81 MG tablet Take 81 mg by mouth daily.     atorvastatin (LIPITOR) 80 MG tablet Take 1 tablet (80 mg total) by mouth daily at 6 PM. 90 tablet 3   dexamethasone (DECADRON) 1 MG tablet 4mg  po BID for 2 days prior to each dose of Gazyva (Patient not taking: Reported on 09/10/2018) 60 tablet 0   dexamethasone (DECADRON) 1 MG tablet 2mg  (2 tabs) daily with breakfast for 3 days then 1mg  (1 tab) daily with breakfast.  Continue pre-Gazyva dose as per previous instructions. 60 tablet 0   Multiple Vitamin (MULTIVITAMIN) tablet Take  1 tablet by mouth daily.     Multiple Vitamins-Minerals (PRESERVISION AREDS PO) Take 2 capsules by mouth daily.     nitroGLYCERIN (NITROSTAT) 0.4 MG SL tablet Place 1 tablet (0.4 mg total) under the tongue every 5 (five) minutes as needed for chest pain. 25 tablet 3   Omega-3 Fatty Acids (FISH OIL CONCENTRATE PO) Take 1 capsule by mouth daily.      ondansetron (ZOFRAN) 8 MG tablet Take 1 tablet (8 mg total) by mouth every 8 (eight) hours as needed for nausea. 30 tablet 3   triamcinolone (NASACORT ALLERGY 24HR) 55 MCG/ACT AERO nasal inhaler Place 2 sprays into the nose daily.     vardenafil (LEVITRA) 10 MG tablet Take 10 mg by mouth as needed for erectile dysfunction.      VENCLEXTA 100 MG TABS TAKE 4 TABLETS (400 MG) BY MOUTH DAILY. TAKE WITH A MEAL AND A GLASS OF WATER AT APPROXIMATELY THE SAME TIME DAILY. (Patient not taking: Reported on 09/10/2018) 120 tablet 1   No current facility-administered medications for this visit.     REVIEW OF SYSTEMS:    A 10+ POINT REVIEW OF SYSTEMS WAS OBTAINED including neurology, dermatology, psychiatry, cardiac, respiratory, lymph, extremities, GI, GU, Musculoskeletal, constitutional, breasts, reproductive, HEENT.  All pertinent positives are noted in the HPI.  All others are negative.   PHYSICAL EXAMINATION: ECOG PERFORMANCE STATUS: 1 - Symptomatic but completely ambulatory  Vitals:   09/10/18 1601  BP: 114/79  Pulse: 80  Resp: 18  Temp: 98.7 F (37.1 C)  SpO2: 95%   Filed Weights   09/10/18 1601  Weight: 158 lb 14.4 oz (72.1 kg)   .Body mass index is 20.4 kg/m.   GENERAL:alert, in no acute distress and comfortable SKIN: no acute rashes, no significant lesions EYES: conjunctiva are pink and non-injected, sclera anicteric OROPHARYNX: MMM, no exudates, no oropharyngeal erythema or ulceration NECK: supple, no JVD LYMPH:  no  palpable lymphadenopathy in the cervical, axillary or inguinal regions LUNGS: few crackles, right base HEART: regular rate & rhythm ABDOMEN:  normoactive bowel sounds , non tender, not distended. No palpable hepatosplenomegaly.  Extremity: no pedal edema PSYCH: alert & oriented x 3 with fluent speech NEURO: no focal motor/sensory deficits   LABORATORY DATA:  I have reviewed the data as listed  . CBC Latest Ref Rng & Units 09/10/2018 08/13/2018 07/31/2018  WBC 4.0 - 10.5 K/uL 9.6 8.5 5.4  Hemoglobin 13.0 - 17.0 g/dL 14.2 14.1 11.7(L)  Hematocrit 39.0 - 52.0 % 45.8 44.3 37.4(L)  Platelets 150 - 400 K/uL 223 311 346  ANC 700<---600<---1200  . CMP Latest Ref Rng & Units 09/10/2018 08/13/2018 07/31/2018  Glucose 70 - 99 mg/dL 110(H) 132(H) 156(H)  BUN 8 - 23 mg/dL 19 18 22   Creatinine 0.61 - 1.24 mg/dL 0.74 0.83 0.83  Sodium 135 - 145 mmol/L 136 140 140  Potassium 3.5 - 5.1 mmol/L 4.6 4.9 3.8  Chloride 98 - 111 mmol/L 98 101 103  CO2 22 - 32 mmol/L 28 28 26   Calcium 8.9 - 10.3 mg/dL 9.1 9.1 8.8(L)  Total Protein 6.5 - 8.1 g/dL 5.9(L) 6.6 6.4(L)  Total Bilirubin 0.3 - 1.2 mg/dL 0.7 0.8 0.8  Alkaline Phos 38 - 126 U/L 79 85 70  AST 15 - 41 U/L 13(L) 15 15  ALT 0 - 44 U/L 22 23 31     04/30/18 Outside Labs:                RADIOGRAPHIC STUDIES: I have  personally reviewed the radiological images as listed and agreed with the findings in the report. No results found.  ASSESSMENT & PLAN:  68 y.o. male with  1. Chronic Lymphocytic Leukemia Initial diagnosis on 02/13/15 Peripheral blood flow cytometry which revealed a Monoclonal B-cell population 09/25/15 Cytogenetics ruled out Mantle cell lymphoma. 12/25/17 FISH study ruled out translocation 11;14 12/30/17 BM Biopsy revealed CLL without evidence of transformation  03/17/18 FISH CLL Prognostic panel revealed an 11q deletion and Trisomy 12  03/19/18 CT N/C/A/P revealed NECK: Multiple enlarged bilateral cervical lymph nodes  consistent with the given diagnosis of CLL. The largest node is a right level 3 lymph node measuring 3.4 x 2.8 x 1.2 cm. 2. Degenerative changes of the cervical spine. 3. Hypodense lesion in the right side of the tongue measuring up to 1.5 cm. This could represent focal infection. Neoplasm is considered less likely. The lesion appears to be superficial along the right lateral aspect of the anterior tongue a. This should be amenable to direct inspection, C/A/P: Marked adenopathy within the chest, abdomen, and pelvis, consistent with active lymphoma/leukemia. 2. Splenomegaly is nonspecific. Splenic involvement cannot be excluded. 3. Right greater than left and basilar predominant peribronchovascular interstitial thickening and nodularity. Considerations include pulmonary involvement of lymphoma and/or Infection/aspiration. 4. Coronary artery atherosclerosis. Aortic Atherosclerosis. 5. Trace nonspecific pelvic fluid.  03/26/18 Left cervical LN biopsy revealed SLL/CLL  08/11/18 CT Neck revealed "Decreased cervical lymphadenopathy with all remaining nodes being subcentimeter in short axis. 2. Sinusitis."  08/11/18 CT Chest revealed "Interval improvement without resolution of the basilar predominant peripheral peribronchovascular nodularity. 2. Interval development of a new masslike consolidative opacity in the medial right lung measuring up to 3.3 cm. This may be infectious/inflammatory. Lymphoma involvement not excluded. 3. Interval resolution of splenomegaly with small volume perisplenic ascites identified today. 4.  Aortic Atherosclerosis."  #2 Lung Lesion -Pt is scheduled for a bronchoscopy on 09/16/2018 -Pt reports that he has not been out of the [place] much -No known contact with tuberculosis  PLAN: -Discussed pt labwork today, 09/10/2018; blood counts are normal, blood chemistries -Discussed that CLL can produced an immunosuppressed state, so the main concern is a subacute infection. -Bronchoscopy  scheduled for 09/16/2018 -Discussed the possibility of Richter's transformation, but that it usually presents with rapidly growing lymph nodes and changes in lab results -CLL is appropriately suppressed, lymphadenopathy and splenomegaly have both resolved. At this time, given his other medical priorities, will focus on addressing his new lung finding. -Will continue holding CLL treatment Gazyva at this time. -Demerol with his pre-medications prior to [facility] infusion. Also two days of 4mg  Dexamethasone BID prior to subsequent infusions. -Will see the pt back in 4 weeks   RTC with Dr Irene Limbo with labs in 4 weeks   All of the patients questions were answered with apparent satisfaction. The patient knows to call the clinic with any problems, questions or concerns.  The total time spent in the appt was 15 minutes and more than 50% was on counseling and direct patient cares.   Sullivan Lone MD MS AAHIVMS [facility] Hematology/Oncology Physician [facility]  (Office):       [phone] (Work cell):  [phone] (Fax):           [phone]  09/10/2018 5:01 PM  I, De Burrs, am acting as a scribe for Dr. Irene Limbo  .I have reviewed the above documentation for accuracy and completeness, and I agree with the above. Brunetta Genera MD

## 2018-09-10 ENCOUNTER — Inpatient Hospital Stay (HOSPITAL_BASED_OUTPATIENT_CLINIC_OR_DEPARTMENT_OTHER): Payer: BC Managed Care – PPO | Admitting: Hematology

## 2018-09-10 ENCOUNTER — Inpatient Hospital Stay: Payer: BC Managed Care – PPO | Attending: Hematology and Oncology

## 2018-09-10 ENCOUNTER — Other Ambulatory Visit: Payer: Self-pay

## 2018-09-10 VITALS — BP 114/79 | HR 80 | Temp 98.7°F | Resp 18 | Ht 74.0 in | Wt 158.9 lb

## 2018-09-10 DIAGNOSIS — R439 Unspecified disturbances of smell and taste: Secondary | ICD-10-CM | POA: Diagnosis not present

## 2018-09-10 DIAGNOSIS — R509 Fever, unspecified: Secondary | ICD-10-CM | POA: Diagnosis not present

## 2018-09-10 DIAGNOSIS — C9110 Chronic lymphocytic leukemia of B-cell type not having achieved remission: Secondary | ICD-10-CM

## 2018-09-10 DIAGNOSIS — I700 Atherosclerosis of aorta: Secondary | ICD-10-CM | POA: Diagnosis not present

## 2018-09-10 DIAGNOSIS — R161 Splenomegaly, not elsewhere classified: Secondary | ICD-10-CM | POA: Diagnosis not present

## 2018-09-10 DIAGNOSIS — H9190 Unspecified hearing loss, unspecified ear: Secondary | ICD-10-CM | POA: Insufficient documentation

## 2018-09-10 DIAGNOSIS — I2510 Atherosclerotic heart disease of native coronary artery without angina pectoris: Secondary | ICD-10-CM | POA: Diagnosis not present

## 2018-09-10 DIAGNOSIS — R918 Other nonspecific abnormal finding of lung field: Secondary | ICD-10-CM

## 2018-09-10 DIAGNOSIS — K149 Disease of tongue, unspecified: Secondary | ICD-10-CM | POA: Insufficient documentation

## 2018-09-10 DIAGNOSIS — R5383 Other fatigue: Secondary | ICD-10-CM | POA: Diagnosis not present

## 2018-09-10 DIAGNOSIS — Z9221 Personal history of antineoplastic chemotherapy: Secondary | ICD-10-CM | POA: Diagnosis not present

## 2018-09-10 DIAGNOSIS — R61 Generalized hyperhidrosis: Secondary | ICD-10-CM | POA: Insufficient documentation

## 2018-09-10 LAB — CMP (CANCER CENTER ONLY)
ALT: 22 U/L (ref 0–44)
AST: 13 U/L — ABNORMAL LOW (ref 15–41)
Albumin: 3.4 g/dL — ABNORMAL LOW (ref 3.5–5.0)
Alkaline Phosphatase: 79 U/L (ref 38–126)
Anion gap: 10 (ref 5–15)
BUN: 19 mg/dL (ref 8–23)
CO2: 28 mmol/L (ref 22–32)
Calcium: 9.1 mg/dL (ref 8.9–10.3)
Chloride: 98 mmol/L (ref 98–111)
Creatinine: 0.74 mg/dL (ref 0.61–1.24)
GFR, Est AFR Am: >60 mL/min (ref >60)
GFR, Estimated: >60 mL/min (ref >60)
Glucose, Bld: 110 mg/dL — ABNORMAL HIGH (ref 70–99)
Potassium: 4.6 mmol/L (ref 3.5–5.1)
Sodium: 136 mmol/L (ref 135–145)
Total Bilirubin: 0.7 mg/dL (ref 0.3–1.2)
Total Protein: 5.9 g/dL — ABNORMAL LOW (ref 6.5–8.1)

## 2018-09-10 LAB — CBC WITH DIFFERENTIAL/PLATELET
Abs Immature Granulocytes: 0.03 10*3/uL (ref 0.00–0.07)
Basophils Absolute: 0.1 10*3/uL (ref 0.0–0.1)
Basophils Relative: 1 %
Eosinophils Absolute: 0.4 10*3/uL (ref 0.0–0.5)
Eosinophils Relative: 4 %
HCT: 45.8 % (ref 39.0–52.0)
Hemoglobin: 14.2 g/dL (ref 13.0–17.0)
Immature Granulocytes: 0 %
Lymphocytes Relative: 5 %
Lymphs Abs: 0.5 10*3/uL — ABNORMAL LOW (ref 0.7–4.0)
MCH: 30.7 pg (ref 26.0–34.0)
MCHC: 31 g/dL (ref 30.0–36.0)
MCV: 99.1 fL (ref 80.0–100.0)
Monocytes Absolute: 0.9 10*3/uL (ref 0.1–1.0)
Monocytes Relative: 9 %
Neutro Abs: 7.8 10*3/uL — ABNORMAL HIGH (ref 1.7–7.7)
Neutrophils Relative %: 81 %
Platelets: 223 10*3/uL (ref 150–400)
RBC: 4.62 MIL/uL (ref 4.22–5.81)
RDW: 15.9 % — ABNORMAL HIGH (ref 11.5–15.5)
WBC: 9.6 10*3/uL (ref 4.0–10.5)
nRBC: 0 % (ref 0.0–0.2)

## 2018-09-10 NOTE — Pre-Procedure Instructions (Signed)
[id number] - [place], [address] AT [address] [place] [address] Phone: [phone] Fax: [phone]  [place], [place] - [address] [place] [address] Phone: [phone] Fax: [phone]      Your procedure is scheduled on  09-16-18  Report to [facility] Main Entrance "A" at [place] and check in at the Admitting office.  Call this number if you have problems the morning of surgery:  [phone]  Call [phone] if you have any questions prior to your surgery date Monday-Friday 8am-4pm    Remember:  Do not eat or drink after midnight the night before your surgery  Take these medicines the morning of surgery with A SIP OF WATER :  nitroGLYCERIN (NITROSTAT) as needed ondansetron [address]) as needed  Follow your surgeon's instructions on when to stop Aspirin.  If no instructions were given by your surgeon then you will need to call the office to get those instructions.    7 days prior to surgery STOP taking any Aspirin (unless otherwise instructed by your surgeon), Aleve, Naproxen, Ibuprofen, Motrin, Advil, Goody's, BC's, all herbal medications, fish oil, and all vitamins.    The Morning of Surgery  Do not wear jewelry, make-up or nail polish.  Do not wear lotions, powders, or perfumes/colognes, or deodorant  .  Men may shave face and neck.  Do not bring valuables to the hospital.  [facility] is not responsible for any belongings or valuables.  If you are a smoker, DO NOT Smoke 24 hours prior to surgery IF you wear a CPAP at night please bring your mask, tubing, and machine the morning of surgery   Remember that you must have someone to transport you home after your surgery, and remain with you for 24 hours if you are discharged the same day.   Contacts, glasses, hearing aids, dentures or bridgework may not be worn  into surgery.    Leave your suitcase in the car.  After surgery it may be brought to your room.  For patients admitted to the hospital, discharge time will be determined by your treatment team.  Patients discharged the day of surgery will not be allowed to drive home.    Special instructions:   [place]- Preparing For Surgery  Before surgery, you can play an important role. Because skin is not sterile, your skin needs to be as free of germs as possible. You can reduce the number of germs on your skin by washing with CHG (chlorahexidine gluconate) Soap before surgery.  CHG is an antiseptic cleaner which kills germs and bonds with the skin to continue killing germs even after washing.    Oral Hygiene is also important to reduce your risk of infection.  Remember - BRUSH YOUR TEETH THE MORNING OF SURGERY WITH YOUR REGULAR TOOTHPASTE  Please do not use if you have an allergy to CHG or antibacterial soaps. If your skin becomes reddened/irritated stop using the CHG.  Do not shave (including legs and underarms) for at least 48 hours prior to first CHG shower. It is OK to shave your face.  Please follow these instructions carefully.   1. Shower the NIGHT BEFORE SURGERY and the MORNING OF SURGERY with CHG Soap.   2. If you chose to wash your hair, wash your hair first as usual with your normal shampoo.  3. After you shampoo, rinse your hair  and body thoroughly to remove the shampoo.  4. Use CHG as you would any other liquid soap. You can apply CHG directly to the skin and wash gently with a scrungie or a clean washcloth.   5. Apply the CHG Soap to your body ONLY FROM THE NECK DOWN.  Do not use on open wounds or open sores. Avoid contact with your eyes, ears, mouth and genitals (private parts). Wash Face and genitals (private parts)  with your normal soap.   6. Wash thoroughly, paying special attention to the area where your surgery will be performed.  7. Thoroughly rinse your body with  warm water from the neck down.  8. DO NOT shower/wash with your normal soap after using and rinsing off the CHG Soap.  9. Pat yourself dry with a CLEAN TOWEL.  10. Wear CLEAN PAJAMAS to bed the night before surgery, wear comfortable clothes the morning of surgery  11. Place CLEAN SHEETS on your bed the night of your first shower and DO NOT SLEEP WITH PETS.  Day of Surgery:  Do not apply any deodorants/lotions. Please shower the morning of surgery with the CHG soap  Please wear clean clothes to the hospital/surgery center.   Remember to brush your teeth WITH YOUR REGULAR TOOTHPASTE.   Please read over the following  sheets that you were given.

## 2018-09-11 ENCOUNTER — Other Ambulatory Visit: Payer: BC Managed Care – PPO

## 2018-09-11 ENCOUNTER — Other Ambulatory Visit: Payer: Self-pay

## 2018-09-11 ENCOUNTER — Telehealth: Payer: Self-pay | Admitting: Hematology

## 2018-09-11 ENCOUNTER — Encounter (HOSPITAL_COMMUNITY): Payer: Self-pay

## 2018-09-11 ENCOUNTER — Ambulatory Visit: Payer: BC Managed Care – PPO | Admitting: Hematology

## 2018-09-11 ENCOUNTER — Inpatient Hospital Stay: Payer: BC Managed Care – PPO

## 2018-09-11 ENCOUNTER — Encounter (HOSPITAL_COMMUNITY)
Admission: RE | Admit: 2018-09-11 | Discharge: 2018-09-11 | Disposition: A | Discharge status: Home / Self Care | Payer: BC Managed Care – PPO | Source: Ambulatory Visit | Attending: Emergency Medicine | Admitting: Emergency Medicine

## 2018-09-11 DIAGNOSIS — Z01812 Encounter for preprocedural laboratory examination: Secondary | ICD-10-CM | POA: Diagnosis not present

## 2018-09-11 NOTE — Telephone Encounter (Signed)
I talk with patient regarding schedule  

## 2018-09-11 NOTE — Progress Notes (Addendum)
  Coronavirus Screening Covid screening 09/12/18 Have you experienced the following symptoms:  Cough yes/no: Yes  (ongoing symptoms hx CLL) Fever (>100.30F)  yes/no: No Runny nose yes/no: No Sore throat yes/no: No Difficulty breathing/shortness of breath  yes/no: No  Have you or a family member traveled in the last 14 days and where? yes/no: No  PCP - Dr. [name]  Oncology: dr. Irene Limbo  Cardiologist - Dr. Sherren Mocha  Chest x-ray - 04/08/18  EKG - 11/11/17  Stress Test - 02/11/2015  ECHO - 02/28/15  Cardiac Cath - 07/15/16  AICD-N/A PM-N/A LOOP-N/A  Sleep Study - denies CPAP - denies  LABS-CBC, BMP drawn 09/10/18  ASA- Last day 09/11/18  ERAS-N/A  HA1C-denies, not diabetic Fasting Blood Sugar - N/A Checks Blood Sugar ___0__ times a day  Anesthesia-Yes, caridac hx Pt denies having chest pain, sob, or fever at this time. All instructions explained to the pt, with a verbal understanding of the material. Pt agrees to go over the instructions while at home for a better understanding. Pt also instructed to self quarantine after being tested for COVID-19. The opportunity to ask questions was provided.

## 2018-09-12 ENCOUNTER — Other Ambulatory Visit (HOSPITAL_COMMUNITY)
Admission: RE | Admit: 2018-09-12 | Discharge: 2018-09-12 | Disposition: A | Discharge status: Home / Self Care | Payer: BC Managed Care – PPO | Source: Ambulatory Visit | Attending: Emergency Medicine | Admitting: Emergency Medicine

## 2018-09-12 DIAGNOSIS — Z01812 Encounter for preprocedural laboratory examination: Secondary | ICD-10-CM | POA: Insufficient documentation

## 2018-09-12 DIAGNOSIS — Z20828 Contact with and (suspected) exposure to other viral communicable diseases: Secondary | ICD-10-CM | POA: Diagnosis not present

## 2018-09-13 LAB — SARS CORONAVIRUS 2 (TAT 6-24 HRS): SARS Coronavirus 2: NEGATIVE

## 2018-09-14 ENCOUNTER — Telehealth: Payer: Self-pay | Admitting: Emergency Medicine

## 2018-09-14 NOTE — Anesthesia Preprocedure Evaluation (Addendum)
Anesthesia Evaluation  Patient identified by MRN, date of birth, ID band Patient awake    Reviewed: Allergy & Precautions, NPO status , Patient's Chart, lab work & pertinent test results  History of Anesthesia Complications Negative for: history of anesthetic complications  Airway Mallampati: II  TM Distance: >3 FB Neck ROM: Full    Dental no notable dental hx. (+) Dental Advisory Given   Pulmonary neg pulmonary ROS,    Pulmonary exam normal        Cardiovascular + CAD and + Cardiac Stents  Normal cardiovascular exam     Neuro/Psych negative neurological ROS  negative psych ROS   GI/Hepatic negative GI ROS, Neg liver ROS,   Endo/Other  negative endocrine ROS  Renal/GU negative Renal ROS     Musculoskeletal negative musculoskeletal ROS (+)   Abdominal   Peds  Hematology negative hematology ROS (+) CLL   Anesthesia Other Findings Day of surgery medications reviewed with the patient.  Reproductive/Obstetrics                            Anesthesia Physical Anesthesia Plan  ASA: III  Anesthesia Plan: General   Post-op Pain Management:    Induction: Intravenous  PONV Risk Score and Plan: 3 and Ondansetron, Dexamethasone and Scopolamine patch - Pre-op  Airway Management Planned: Oral ETT  Additional Equipment:   Intra-op Plan:   Post-operative Plan: Extubation in OR  Informed Consent: I have reviewed the patients History and Physical, chart, labs and discussed the procedure including the risks, benefits and alternatives for the proposed anesthesia with the patient or authorized representative who has indicated his/her understanding and acceptance.     Dental advisory given  Plan Discussed with: Anesthesiologist and CRNA  Anesthesia Plan Comments: (Follows with cardiology Dr. Burt Knack for CAD s/p stent to the LAD in 2016. Last seen by Dr. Burt Knack 04/09/18. Stable from CV standpoint.  Per note, "The patient is having no anginal symptoms.  We reviewed his cardiac catheterization films today from both of his previous studies.  His most recent catheterization demonstrated patency of his stent site with no significant restenosis.  He has mild to moderate nonobstructive disease elsewhere which appears stable over serial studies.  He fortunately is having no cardiac-related symptoms at this time and I think we can continue to monitor him with periodic follow-up.  I would like to see him back in 6 months.  No changes are made in his medical program."  CLL followed by Dr. Irene Limbo. Underwent chemotherapy with dexamethasone, Veneclax, but had to be stopped due to leukopenia. Then started obinutuzumab by Dr. Irene Limbo in June 2020. He has had a return of weakness, dyspnea, LAD, cough, wt loss. He is bringing up yellowish mucous, coughs every morning. CT chest on 7/7 shows a medial right lower lobe paraspinal opacity with an irregular border, diffuse bronchial wall thickening and some mild bronchiectatic change.  Dr. Lamonte Sakai recommended navigational bronchoscopy and BAL to evaluate further.  Cath 07/15/16:  Mid RCA lesion, 50 %stenosed.  Mid LAD-2 lesion, 0 %stenosed.  A drug eluting is in place  Mid LAD-1 lesion, 30 %stenosed.  1st Diag lesion, 30 %stenosed.  The left ventricular systolic function is normal.  LV end diastolic pressure is normal.  The left ventricular ejection fraction is 55-65% by visual estimate.   1. Nonobstructive CAD. The stent in the mid LAD is widely patent. There is 30% narrowing proximal to the stent. The RCA is a  large and ectatic vessel with 50% stenosis in the mid vessel. This is unchanged from prior studies. 2. Normal LV function 3. Normal LVEDP.  Plan: no culprit lesion to explain his current chest pain. Recommend continued medical therapy.  )     Anesthesia Quick Evaluation

## 2018-09-14 NOTE — Telephone Encounter (Signed)
LMTCB

## 2018-09-15 NOTE — Telephone Encounter (Signed)
[place] ON THIS [facility] [place]  [phone] aMANDA NEEDING AUTHO FOR SURGERY FOR TOMORROW.Hillery Hunter

## 2018-09-15 NOTE — Telephone Encounter (Signed)
Spoke with Golden [employer] and was advised the decision was not back and she will call Estill Bamberg in the morning of 8/12.  Will forward to [facility] as urgent.

## 2018-09-15 NOTE — Telephone Encounter (Signed)
Spoke with Golden Circle, [facility], and was advised the ENB Josem Kaufmann is still under nurse review. Golden Circle states she was check around 1630 to see if it has been approved.   LMTCB for [employer].

## 2018-09-16 ENCOUNTER — Ambulatory Visit (HOSPITAL_COMMUNITY): Payer: BC Managed Care – PPO | Admitting: Physician Assistant

## 2018-09-16 ENCOUNTER — Encounter (HOSPITAL_COMMUNITY): Payer: Self-pay | Admitting: Certified Registered"

## 2018-09-16 ENCOUNTER — Ambulatory Visit (HOSPITAL_COMMUNITY): Payer: BC Managed Care – PPO

## 2018-09-16 ENCOUNTER — Other Ambulatory Visit: Payer: Self-pay

## 2018-09-16 ENCOUNTER — Ambulatory Visit (HOSPITAL_COMMUNITY): Payer: BC Managed Care – PPO | Admitting: Certified Registered Nurse Anesthetist

## 2018-09-16 ENCOUNTER — Ambulatory Visit (HOSPITAL_COMMUNITY)
Admission: RE | Admit: 2018-09-16 | Discharge: 2018-09-16 | Disposition: A | Discharge status: Home / Self Care | Payer: BC Managed Care – PPO | Attending: Emergency Medicine | Admitting: Emergency Medicine

## 2018-09-16 ENCOUNTER — Encounter (HOSPITAL_COMMUNITY)
Admission: RE | Disposition: A | Discharge status: Home / Self Care | Payer: Self-pay | Source: Home / Self Care | Attending: Emergency Medicine

## 2018-09-16 DIAGNOSIS — Z7982 Long term (current) use of aspirin: Secondary | ICD-10-CM | POA: Insufficient documentation

## 2018-09-16 DIAGNOSIS — Z419 Encounter for procedure for purposes other than remedying health state, unspecified: Secondary | ICD-10-CM

## 2018-09-16 DIAGNOSIS — Z79899 Other long term (current) drug therapy: Secondary | ICD-10-CM | POA: Diagnosis not present

## 2018-09-16 DIAGNOSIS — Z856 Personal history of leukemia: Secondary | ICD-10-CM | POA: Insufficient documentation

## 2018-09-16 DIAGNOSIS — I2510 Atherosclerotic heart disease of native coronary artery without angina pectoris: Secondary | ICD-10-CM | POA: Insufficient documentation

## 2018-09-16 DIAGNOSIS — Z48813 Encounter for surgical aftercare following surgery on the respiratory system: Secondary | ICD-10-CM | POA: Diagnosis not present

## 2018-09-16 DIAGNOSIS — Z85828 Personal history of other malignant neoplasm of skin: Secondary | ICD-10-CM | POA: Insufficient documentation

## 2018-09-16 DIAGNOSIS — N400 Enlarged prostate without lower urinary tract symptoms: Secondary | ICD-10-CM | POA: Diagnosis not present

## 2018-09-16 DIAGNOSIS — Z9889 Other specified postprocedural states: Secondary | ICD-10-CM

## 2018-09-16 DIAGNOSIS — R911 Solitary pulmonary nodule: Secondary | ICD-10-CM | POA: Diagnosis not present

## 2018-09-16 DIAGNOSIS — R918 Other nonspecific abnormal finding of lung field: Secondary | ICD-10-CM | POA: Diagnosis not present

## 2018-09-16 DIAGNOSIS — N529 Male erectile dysfunction, unspecified: Secondary | ICD-10-CM | POA: Insufficient documentation

## 2018-09-16 DIAGNOSIS — Z9221 Personal history of antineoplastic chemotherapy: Secondary | ICD-10-CM | POA: Diagnosis not present

## 2018-09-16 DIAGNOSIS — R9389 Abnormal findings on diagnostic imaging of other specified body structures: Secondary | ICD-10-CM | POA: Diagnosis not present

## 2018-09-16 DIAGNOSIS — E785 Hyperlipidemia, unspecified: Secondary | ICD-10-CM | POA: Diagnosis not present

## 2018-09-16 DIAGNOSIS — R848 Other abnormal findings in specimens from respiratory organs and thorax: Secondary | ICD-10-CM | POA: Diagnosis not present

## 2018-09-16 HISTORY — PX: VIDEO BRONCHOSCOPY WITH ENDOBRONCHIAL NAVIGATION: SHX6175

## 2018-09-16 SURGERY — VIDEO BRONCHOSCOPY WITH ENDOBRONCHIAL NAVIGATION
Anesthesia: General | Site: Chest

## 2018-09-16 MED ORDER — DEXAMETHASONE SODIUM PHOSPHATE 10 MG/ML IJ SOLN
INTRAMUSCULAR | Status: DC | PRN
  Administered 2018-09-16: 10 mg via INTRAVENOUS

## 2018-09-16 MED ORDER — ARTIFICIAL TEARS OPHTHALMIC OINT
TOPICAL_OINTMENT | OPHTHALMIC | Status: DC | PRN
  Administered 2018-09-16: 1 via OPHTHALMIC

## 2018-09-16 MED ORDER — PHENYLEPHRINE 40 MCG/ML (10ML) SYRINGE FOR IV PUSH (FOR BLOOD PRESSURE SUPPORT)
PREFILLED_SYRINGE | INTRAVENOUS | Status: AC
  Filled 2018-09-16: qty 10

## 2018-09-16 MED ORDER — SODIUM CHLORIDE 0.9 % IV SOLN
INTRAVENOUS | Status: DC | PRN
  Administered 2018-09-16: 40 ug/min via INTRAVENOUS

## 2018-09-16 MED ORDER — DEXAMETHASONE SODIUM PHOSPHATE 10 MG/ML IJ SOLN
INTRAMUSCULAR | Status: AC
  Filled 2018-09-16: qty 1

## 2018-09-16 MED ORDER — MIDAZOLAM HCL 2 MG/2ML IJ SOLN
INTRAMUSCULAR | Status: AC
  Filled 2018-09-16: qty 2

## 2018-09-16 MED ORDER — ONDANSETRON HCL 4 MG/2ML IJ SOLN
INTRAMUSCULAR | Status: AC
  Filled 2018-09-16: qty 2

## 2018-09-16 MED ORDER — ONDANSETRON HCL 4 MG/2ML IJ SOLN
INTRAMUSCULAR | Status: DC | PRN
  Administered 2018-09-16: 4 mg via INTRAVENOUS

## 2018-09-16 MED ORDER — FENTANYL CITRATE (PF) 100 MCG/2ML IJ SOLN: 25.0000 ug | INTRAMUSCULAR | Status: DC | PRN

## 2018-09-16 MED ORDER — SUCCINYLCHOLINE CHLORIDE 200 MG/10ML IV SOSY
PREFILLED_SYRINGE | INTRAVENOUS | Status: DC | PRN
  Administered 2018-09-16: 120 mg via INTRAVENOUS

## 2018-09-16 MED ORDER — MIDAZOLAM HCL 5 MG/5ML IJ SOLN
INTRAMUSCULAR | Status: DC | PRN
  Administered 2018-09-16: 2 mg via INTRAVENOUS

## 2018-09-16 MED ORDER — ROCURONIUM BROMIDE 10 MG/ML (PF) SYRINGE
PREFILLED_SYRINGE | INTRAVENOUS | Status: DC | PRN
  Administered 2018-09-16: 50 mg via INTRAVENOUS

## 2018-09-16 MED ORDER — LIDOCAINE 2% (20 MG/ML) 5 ML SYRINGE
INTRAMUSCULAR | Status: AC
  Filled 2018-09-16: qty 5

## 2018-09-16 MED ORDER — FENTANYL CITRATE (PF) 250 MCG/5ML IJ SOLN
INTRAMUSCULAR | Status: AC
  Filled 2018-09-16: qty 5

## 2018-09-16 MED ORDER — SUGAMMADEX SODIUM 200 MG/2ML IV SOLN
INTRAVENOUS | Status: DC | PRN
  Administered 2018-09-16: 120 mg via INTRAVENOUS

## 2018-09-16 MED ORDER — ROCURONIUM BROMIDE 10 MG/ML (PF) SYRINGE
PREFILLED_SYRINGE | INTRAVENOUS | Status: AC
  Filled 2018-09-16: qty 10

## 2018-09-16 MED ORDER — FENTANYL CITRATE (PF) 250 MCG/5ML IJ SOLN
INTRAMUSCULAR | Status: DC | PRN
  Administered 2018-09-16: 100 ug via INTRAVENOUS
  Administered 2018-09-16: 50 ug via INTRAVENOUS

## 2018-09-16 MED ORDER — EPHEDRINE SULFATE-NACL 50-0.9 MG/10ML-% IV SOSY
PREFILLED_SYRINGE | INTRAVENOUS | Status: DC | PRN
  Administered 2018-09-16: 10 mg via INTRAVENOUS

## 2018-09-16 MED ORDER — LACTATED RINGERS IV SOLN
INTRAVENOUS | Status: DC | PRN
  Administered 2018-09-16: 07:00:00 via INTRAVENOUS

## 2018-09-16 MED ORDER — EPHEDRINE 5 MG/ML INJ
INTRAVENOUS | Status: AC
  Filled 2018-09-16: qty 10

## 2018-09-16 MED ORDER — ARTIFICIAL TEARS OPHTHALMIC OINT
TOPICAL_OINTMENT | OPHTHALMIC | Status: AC
  Filled 2018-09-16: qty 3.5

## 2018-09-16 MED ORDER — PROPOFOL 10 MG/ML IV BOLUS
INTRAVENOUS | Status: DC | PRN
  Administered 2018-09-16: 150 mg via INTRAVENOUS

## 2018-09-16 MED ORDER — PROMETHAZINE HCL 25 MG/ML IJ SOLN: 6.2500 mg | INTRAMUSCULAR | Status: DC | PRN

## 2018-09-16 MED ORDER — PROPOFOL 10 MG/ML IV BOLUS
INTRAVENOUS | Status: AC
  Filled 2018-09-16: qty 20

## 2018-09-16 MED ORDER — CELECOXIB 200 MG PO CAPS
400.0000 mg | ORAL_CAPSULE | Freq: Once | ORAL | Status: AC
  Administered 2018-09-16: 400 mg via ORAL
  Filled 2018-09-16: qty 2

## 2018-09-16 MED ORDER — PHENYLEPHRINE 40 MCG/ML (10ML) SYRINGE FOR IV PUSH (FOR BLOOD PRESSURE SUPPORT)
PREFILLED_SYRINGE | INTRAVENOUS | Status: DC | PRN
  Administered 2018-09-16: 120 ug via INTRAVENOUS
  Administered 2018-09-16: 160 ug via INTRAVENOUS

## 2018-09-16 MED ORDER — ACETAMINOPHEN 500 MG PO TABS
1000.0000 mg | ORAL_TABLET | Freq: Once | ORAL | Status: AC
  Administered 2018-09-16: 1000 mg via ORAL
  Filled 2018-09-16: qty 2

## 2018-09-16 MED ORDER — 0.9 % SODIUM CHLORIDE (POUR BTL) OPTIME
TOPICAL | Status: DC | PRN
  Administered 2018-09-16: 1000 mL

## 2018-09-16 MED ORDER — BENZONATATE 100 MG PO CAPS: 100.0000 mg | ORAL_CAPSULE | Freq: Four times a day (QID) | ORAL | 0 refills | Status: DC | PRN

## 2018-09-16 MED ORDER — SUCCINYLCHOLINE CHLORIDE 200 MG/10ML IV SOSY
PREFILLED_SYRINGE | INTRAVENOUS | Status: AC
  Filled 2018-09-16: qty 10

## 2018-09-16 MED ORDER — SCOPOLAMINE 1 MG/3DAYS TD PT72
1.0000 | MEDICATED_PATCH | TRANSDERMAL | Status: DC
  Administered 2018-09-16: 1.5 mg via TRANSDERMAL
  Filled 2018-09-16: qty 1

## 2018-09-16 MED ORDER — LIDOCAINE 2% (20 MG/ML) 5 ML SYRINGE
INTRAMUSCULAR | Status: DC | PRN
  Administered 2018-09-16: 80 mg via INTRAVENOUS

## 2018-09-16 SURGICAL SUPPLY — 44 items
ADAPTER BRONCHOSCOPE OLYMPUS (ADAPTER) ×2 IMPLANT
ADAPTER VALVE BIOPSY EBUS (MISCELLANEOUS) IMPLANT
ADPR BSCP OLMPS EDG (ADAPTER) ×1
ADPTR VALVE BIOPSY EBUS (MISCELLANEOUS)
BRUSH CYTOL CELLEBRITY 1.5X140 (MISCELLANEOUS) ×2 IMPLANT
BRUSH SUPERTRAX BIOPSY (INSTRUMENTS) IMPLANT
BRUSH SUPERTRAX NDL-TIP CYTO (INSTRUMENTS) ×2 IMPLANT
CANISTER SUCT 3000ML PPV (MISCELLANEOUS) ×2 IMPLANT
CHANNEL WORK EXTEND EDGE 180 (KITS) IMPLANT
CHANNEL WORK EXTEND EDGE 90 (KITS) IMPLANT
CONT SPEC 4OZ CLIKSEAL STRL BL (MISCELLANEOUS) ×3 IMPLANT
COVER BACK TABLE 60X90IN (DRAPES) ×2 IMPLANT
COVER WAND RF STERILE (DRAPES) ×2 IMPLANT
FILTER STRAW FLUID ASPIR (MISCELLANEOUS) IMPLANT
FORCEPS BIOP SUPERTRX PREMAR (INSTRUMENTS) ×2 IMPLANT
GAUZE SPONGE 4X4 12PLY STRL (GAUZE/BANDAGES/DRESSINGS) ×2 IMPLANT
GLOVE BIO SURGEON STRL SZ7.5 (GLOVE) ×4 IMPLANT
GOWN STRL REUS W/ TWL LRG LVL3 (GOWN DISPOSABLE) ×2 IMPLANT
GOWN STRL REUS W/TWL LRG LVL3 (GOWN DISPOSABLE) ×4
KIT CLEAN ENDO COMPLIANCE (KITS) ×2 IMPLANT
KIT ENDOBRONCHIAL EDGE FIRM (KITS) ×1 IMPLANT
KIT LOCATABLE GUIDE (CANNULA) IMPLANT
KIT MARKER FIDUCIAL DELIVERY (KITS) IMPLANT
KIT PROCEDURE EDGE 180 (KITS) IMPLANT
KIT PROCEDURE EDGE 90 (KITS) IMPLANT
KIT TURNOVER KIT B (KITS) ×2 IMPLANT
MARKER SKIN DUAL TIP RULER LAB (MISCELLANEOUS) ×2 IMPLANT
NDL SUPERTRX PREMARK BIOPSY (NEEDLE) ×1 IMPLANT
NEEDLE SUPERTRX PREMARK BIOPSY (NEEDLE) ×2 IMPLANT
NS IRRIG 1000ML POUR BTL (IV SOLUTION) ×2 IMPLANT
OIL SILICONE PENTAX (PARTS (SERVICE/REPAIRS)) ×2 IMPLANT
PAD ARMBOARD 7.5X6 YLW CONV (MISCELLANEOUS) ×4 IMPLANT
PATCHES PATIENT (LABEL) ×6 IMPLANT
SYR 20ML ECCENTRIC (SYRINGE) ×2 IMPLANT
SYR 20ML LL LF (SYRINGE) ×2 IMPLANT
SYR 50ML SLIP (SYRINGE) ×2 IMPLANT
TOWEL GREEN STERILE FF (TOWEL DISPOSABLE) ×2 IMPLANT
TRAP SPECIMEN MUCOUS 40CC (MISCELLANEOUS) ×1 IMPLANT
TUBE CONNECTING 20X1/4 (TUBING) ×2 IMPLANT
UNDERPAD 30X30 (UNDERPADS AND DIAPERS) ×2 IMPLANT
VALVE BIOPSY  SINGLE USE (MISCELLANEOUS) ×1
VALVE BIOPSY SINGLE USE (MISCELLANEOUS) ×1 IMPLANT
VALVE SUCTION BRONCHIO DISP (MISCELLANEOUS) ×2 IMPLANT
WATER STERILE IRR 1000ML POUR (IV SOLUTION) ×2 IMPLANT

## 2018-09-16 NOTE — Interval H&P Note (Signed)
PCCM Interval Note  Pt presents for continued evaluation of cough, sputum, R medial LL infiltrate / opacity on CT chest. He continues to have cough, tan sputum. Can happen all day, especially bothersome at night. No new CP or SOB.  He completed Augmentin course ~10 days ago.   Vitals:   09/16/18 0632 09/16/18 0634 09/16/18 0757  BP:  (!) 165/91 121/72  Pulse:  99   Resp: 18    Temp: 98.1 F (36.7 C)    TempSrc: Oral    SpO2:  90%   Weight: 71.7 kg    Height: 6\' 2"  (1.88 m)    thin man, NAD. Coarse B BS, scattered soft exp wheeze esp w cough Heart regular. Abdomen benign. No edema.   Plan for navigational FOB to approach the medial RLL lesion. Need to assess for possible lymphoma with FLOW, as well as other malignancies. Will also obtain cx data. Procedure explained to pt, risks and benefits. All questions answered. No barriers identified.   [name], MD, PhD 09/16/2018, 8:09 AM [facility] [phone] or if no answer [phone]

## 2018-09-16 NOTE — Transfer of Care (Signed)
Immediate Anesthesia Transfer of Care Note  Patient: Jake Kennedy  Procedure(s) Performed: VIDEO BRONCHOSCOPY WITH ENDOBRONCHIAL NAVIGATION AND BIOPIES (N/A Chest)  Patient Location: PACU  Anesthesia Type:General  Level of Consciousness: drowsy  Airway & Oxygen Therapy: Patient Spontanous Breathing and Patient connected to face mask oxygen  Post-op Assessment: Report given to RN and Post -op Vital signs reviewed and stable  Post vital signs: Reviewed and stable  Last Vitals:  Vitals Value Taken Time  BP 101/64 09/16/18 0957  Temp    Pulse 97 09/16/18 1002  Resp 17 09/16/18 1002  SpO2 90 % 09/16/18 1002  Vitals shown include unvalidated device data.  Last Pain:  Vitals:   09/16/18 0813  TempSrc:   PainSc: 0-No pain      Patients Stated Pain Goal: 2 (47/99/87 2158)  Complications: No apparent anesthesia complications

## 2018-09-16 NOTE — OR Nursing (Signed)
Right Mainstem Bronchus Brushing was sent for Precepta RNA research taken by Gladstone Lighter, [employer] Representative.   Linwood Dibbles, RN 09/16/2018 @ [phone]

## 2018-09-16 NOTE — Telephone Encounter (Signed)
Approval has been done [id number] Jake Kennedy

## 2018-09-16 NOTE — Anesthesia Postprocedure Evaluation (Signed)
Anesthesia Post Note  Patient: SHEPARD KELTZ  Procedure(s) Performed: VIDEO BRONCHOSCOPY WITH ENDOBRONCHIAL NAVIGATION AND BIOPIES (N/A Chest)     Patient location during evaluation: PACU Anesthesia Type: General Level of consciousness: sedated Pain management: pain level controlled Vital Signs Assessment: post-procedure vital signs reviewed and stable Respiratory status: spontaneous breathing and respiratory function stable Cardiovascular status: stable Postop Assessment: no apparent nausea or vomiting Anesthetic complications: no    Last Vitals:  Vitals:   09/16/18 1045 09/16/18 1105  BP: 105/68   Pulse: 80   Resp: 16   Temp:  36.4 C  SpO2: (!) 83%     Last Pain:  Vitals:   09/16/18 1118  TempSrc:   PainSc: 0-No pain                 Morris Longenecker DANIEL

## 2018-09-16 NOTE — Discharge Instructions (Signed)
Flexible Bronchoscopy, Care After This sheet gives you information about how to care for yourself after your test. Your doctor may also give you more specific instructions. If you have problems or questions, contact your doctor. Follow these instructions at home: Eating and drinking  Do not eat or drink anything (not even water) for 2 hours after your test, or until your numbing medicine (local anesthetic) wears off.  When your numbness is gone and your cough and gag reflexes have come back, you may: ? Eat only soft foods. ? Slowly drink liquids.  The day after the test, go back to your normal diet. Driving  Do not drive for 24 hours if you were given a medicine to help you relax (sedative).  Do not drive or use heavy machinery while taking prescription pain medicine. General instructions   Take over-the-counter and prescription medicines only as told by your doctor.  Return to your normal activities as told. Ask what activities are safe for you.  Do not use any products that have nicotine or tobacco in them. This includes cigarettes and e-cigarettes. If you need help quitting, ask your doctor.  Keep all follow-up visits as told by your doctor. This is important. It is very important if you had a tissue sample (biopsy) taken. Get help right away if:  You have shortness of breath that gets worse.  You get light-headed.  You feel like you are going to pass out (faint).  You have chest pain.  You cough up: ? More than a little blood. ? More blood than before. Summary  Do not eat or drink anything (not even water) for 2 hours after your test, or until your numbing medicine wears off.  Do not use cigarettes. Do not use e-cigarettes.  Get help right away if you have chest pain.  Please call our office for any questions or concerns. [phone]  This information is not intended to replace advice given to you by your health care provider. Make sure you discuss any questions  you have with your health care provider. Document Released: 11/18/2008 Document Revised: 01/03/2017 Document Reviewed: 02/09/2016 Elsevier Patient Education  2020 [employer].

## 2018-09-16 NOTE — Op Note (Signed)
Video Bronchoscopy with Electromagnetic Navigation Procedure Note  Date of Operation: 09/16/2018  Pre-op Diagnosis: Right lower lobe nodule  Post-op Diagnosis: Same  Surgeon: [name]  Assistants: None  Anesthesia: General endotracheal anesthesia  Operation: Flexible video fiberoptic bronchoscopy with electromagnetic navigation and biopsies.  Estimated Blood Loss: Minimal  Complications: None apparent  Indications and History: Jake Kennedy is a 68 y.o. male with history of CLL and abnormal CT scan of the chest.  This characterized by some evolving pulmonary nodular disease initially bibasilar small nodules.  These improved after he received steroids, chemotherapy.  Then developed a more medial superior segment right lower lobe rounded nodule on subsequent imaging.  Recommendation was made to achieve tissue diagnosis via navigational bronchoscopy.  The risks, benefits, complications, treatment options and expected outcomes were discussed with the patient.  The possibilities of pneumothorax, pneumonia, reaction to medication, pulmonary aspiration, perforation of a viscus, bleeding, failure to diagnose a condition and creating a complication requiring transfusion or operation were discussed with the patient who freely signed the consent.    Description of Procedure: The patient was seen in the Preoperative Area, was examined and was deemed appropriate to proceed.  The patient was taken to OR 10, identified as Jake Kennedy and the procedure verified as Flexible Video Fiberoptic Bronchoscopy.  A Time Out was held and the above information confirmed.   Prior to the date of the procedure a high-resolution CT scan of the chest was performed. Utilizing [place] a virtual tracheobronchial tree was generated to allow the creation of distinct navigation pathways to the patient's parenchymal abnormalities. After being taken to the operating room general anesthesia was initiated and  the patient  was orally intubated. The video fiberoptic bronchoscope was introduced via the endotracheal tube and a general inspection was performed which showed copious tan secretions from all airways. No blood. A tracheal aspirate of these secretions was performed to be sent for culture. Two R mainstem endobronchial brushings were performed to facilitate Percepta testing should we decide to order. The extendable working channel and locator guide were introduced into the bronchoscope. The distinct navigation pathways prepared prior to this procedure were then utilized to navigate to within 0.8 - 1.2cm of patient's RLL nodule identified on CT scan. The extendable working channel was secured into place and the locator guide was withdrawn. Under fluoroscopic guidance transbronchial Wang needle biopsies and transbronchial forceps biopsies were performed to be sent for cytology and pathology. The cytology material was saved in saline to facilitate FLOW Cytometry.  A bronchioalveolar lavage was performed in the RLL superior segment and sent for cytology and microbiology (bacterial, fungal, AFB smears and cultures). At the end of the procedure a general airway inspection was performed and there was no evidence of active bleeding. The bronchoscope was removed.  The patient tolerated the procedure well. There was no significant blood loss and there were no obvious complications. A post-procedural chest x-ray is pending.  Samples: 1. Transbronchial Wang needle biopsies from RLL nodule 2. Transbronchial forceps biopsies from RLL nodule 3. Bronchoalveolar lavage from RLL superior segment 4. Endobronchial brushings from R mainstem bronchus 5. Tracheal aspirate for microbiology   Plans:  The patient will be discharged from the PACU to home when recovered from anesthesia and after chest x-ray is reviewed. We will review the cytology, pathology and microbiology results with the patient when they become available.  Outpatient followup will be with Dr Lamonte Sakai and Dr Irene Limbo.    [name], MD, PhD 09/16/2018,  10:06 AM [facility] [phone] or if no answer [phone]

## 2018-09-16 NOTE — Anesthesia Procedure Notes (Signed)
Procedure Name: Intubation Date/Time: 09/16/2018 8:35 AM Performed by: Imagene Riches, CRNA Pre-anesthesia Checklist: Patient identified, Emergency Drugs available, Suction available and Patient being monitored Patient Re-evaluated:Patient Re-evaluated prior to induction Oxygen Delivery Method: Circle System Utilized Preoxygenation: Pre-oxygenation with 100% oxygen Induction Type: IV induction Ventilation: Mask ventilation without difficulty Laryngoscope Size: Miller and 2 Grade View: Grade I Tube type: Oral Tube size: 8.5 mm Number of attempts: 1 Airway Equipment and Method: Stylet and Oral airway Placement Confirmation: ETT inserted through vocal cords under direct vision,  positive ETCO2 and breath sounds checked- equal and bilateral Secured at: 23 cm Tube secured with: Tape Dental Injury: Teeth and Oropharynx as per pre-operative assessment

## 2018-09-17 ENCOUNTER — Encounter (HOSPITAL_COMMUNITY): Payer: Self-pay | Admitting: Emergency Medicine

## 2018-09-17 LAB — ACID FAST SMEAR (AFB, MYCOBACTERIA)
Acid Fast Smear: NEGATIVE
Acid Fast Smear: NEGATIVE

## 2018-09-18 ENCOUNTER — Encounter: Payer: Self-pay | Admitting: Primary Care

## 2018-09-18 ENCOUNTER — Ambulatory Visit: Payer: BC Managed Care – PPO

## 2018-09-18 ENCOUNTER — Ambulatory Visit (INDEPENDENT_AMBULATORY_CARE_PROVIDER_SITE_OTHER): Payer: BC Managed Care – PPO

## 2018-09-18 ENCOUNTER — Ambulatory Visit (INDEPENDENT_AMBULATORY_CARE_PROVIDER_SITE_OTHER): Payer: BC Managed Care – PPO | Admitting: Primary Care

## 2018-09-18 ENCOUNTER — Other Ambulatory Visit: Payer: Self-pay

## 2018-09-18 ENCOUNTER — Telehealth: Payer: Self-pay | Admitting: Emergency Medicine

## 2018-09-18 DIAGNOSIS — R053 Chronic cough: Secondary | ICD-10-CM | POA: Insufficient documentation

## 2018-09-18 DIAGNOSIS — R0602 Shortness of breath: Secondary | ICD-10-CM

## 2018-09-18 DIAGNOSIS — R05 Cough: Secondary | ICD-10-CM

## 2018-09-18 DIAGNOSIS — R9389 Abnormal findings on diagnostic imaging of other specified body structures: Secondary | ICD-10-CM

## 2018-09-18 LAB — CULTURE, RESPIRATORY W GRAM STAIN
Culture: NO GROWTH
Culture: NO GROWTH

## 2018-09-18 MED ORDER — FLUTTER DEVI: 0 refills | Status: DC

## 2018-09-18 MED ORDER — ALBUTEROL SULFATE HFA 108 (90 BASE) MCG/ACT IN AERS: 2.0000 | INHALATION_SPRAY | Freq: Four times a day (QID) | RESPIRATORY_TRACT | 0 refills | Status: DC | PRN

## 2018-09-18 NOTE — Assessment & Plan Note (Addendum)
-   Shortness of breath lying down since bronchoscopy on 8/12 - CXR showed no evidence of effusion or pneumothorax; progressive atelectasis in the right middle lobe. Mild interstitial changes are noted slightly increased from the prior exam. - Ambulatory O2 with no desaturation  - RX albuterol rescue inahler 2 puffs every 4-6 hours for shortness of breath  - Encouraged patient to use IS every hour  - FU next week as scheduled  - Advised ED if patient develops chest pain or worsening sob

## 2018-09-18 NOTE — Assessment & Plan Note (Addendum)
-   Per Dr. Lamonte Sakai patient had a lot of mucus during bronchoscopy  - Respiratory culture have shown no growth x 2 days - He has completed course of doxycycline, Levaquin and augmentin - Advised patient take mucinex twice daily - Use flutter valve 3-4 times day for congestion - Tesslone perles at night for cough suppression

## 2018-09-18 NOTE — Telephone Encounter (Signed)
Called and spoke with pt letting him know the results of the biopsies from [place]. Pt verbalized understanding. Let pt know that as soon as we had the results back from the culture that we would contact him and let him know and pt verbalized understanding. Nothing further needed.

## 2018-09-18 NOTE — Telephone Encounter (Signed)
Per E. Volanda Napoleon please have patient come in for CXR and visit. Call made to patient. Made aware. Nothing further needed at this time. Order for cxr placed.

## 2018-09-18 NOTE — Addendum Note (Signed)
Addended by: Vivia Ewing on: 09/18/2018 09:47 AM   Modules accepted: Orders

## 2018-09-18 NOTE — Progress Notes (Signed)
@Patient  ID: Jake Kennedy, male    DOB: 07-17-1950, 68 y.o.   MRN: [id number]  Chief Complaint  Patient presents with  . Acute Visit    Bronch done 09/16/18- having some SOB when he lies down since then. He has prod cough with yellow sputum.     Referring provider: [name], MD  HPI: 68 year old male, never smoked. PMH significant for CAD, CLL s/p chemotherapy, leukopenia, dyspnea, LAD, cough, weight loss. Patient of Dr. Lamonte Sakai, seen for initial consult on 09/07/18 for evaluation of abnormal CT chest. S/p flex bronch with nav on 8/12 with Dr. Lamonte Sakai for RLL nodule. Pathology negative for malignancy. Cultures showed no growth.   09/18/2018  Patient presents today for acute visit with complaints of shortness of breath when lying down since bronchoscopy and congested cough. States that he is coughing up a lot of mucus, yellow/tan coloring. He has been treated with 10 day course of both doxycycline and levaquin with no significant improvement in his cough. He then was prescribed course of augmentin and Prednisone by ENT for sinusitis. Respiratory culture showed no growth after 2 days. AFB negative. Denies fevers or N/V. COVID NEGATIVE.   Images CT chest 08/11/18 showed a medial right lower lobe paraspinal opacity with an irregular border, diffuse bronchial wall thickening and some mild bronchiectatic change.  This is compared with a CT chest from 03/19/2018 and shows an interval improvement in base predominant peripheral peribronchovascular nodularity.  CT neck showed decreased cervical lymphadenopathy.   Allergies  Allergen Reactions  . Adhesive [Tape]     Blisters & hives - Paper tape ok  . Latex Itching    Blisters and itching - pt states that it's not Latex but it's adhesives    Immunization History  Administered Date(s) Administered  . Influenza, High Dose Seasonal PF 10/23/2017  . Influenza-Unspecified 11/10/2014    Past Medical History:  Diagnosis Date  . Basal cell carcinoma     moles removed in 2007 and 2012 from L nose and L hip  . BPH (benign prostatic hypertrophy)   . CAD (coronary artery disease), native coronary artery    a. 02/10/2015 95% mid LAD stenosis s/p DES  b. 07/15/16: LHC showed stable non obst dz and patent stent  . CLL (chronic lymphocytic leukemia) ([place])   . Coronary artery disease   . ED (erectile dysfunction)   . Lymphocytosis 02/11/2015   seen by Dr. Lindi Adie 02/11/2015, working up for possible CLL    Tobacco History: Social History   Tobacco Use  Smoking Status Never Smoker  Smokeless Tobacco Never Used   Counseling given: Not Answered   Outpatient Medications Prior to Visit  Medication Sig Dispense Refill  . acetaminophen (TYLENOL) 500 MG tablet Take 1,000 mg by mouth every 6 (six) hours as needed.    Marland Kitchen atorvastatin (LIPITOR) 80 MG tablet Take 1 tablet (80 mg total) by mouth daily at 6 PM. 90 tablet 3  . benzonatate (TESSALON PERLES) 100 MG capsule Take 1 capsule (100 mg total) by mouth every 6 (six) hours as needed for cough. 45 capsule 0  . Multiple Vitamin (MULTIVITAMIN) tablet Take 1 tablet by mouth daily.    . Multiple Vitamins-Minerals (PRESERVISION AREDS PO) Take 2 capsules by mouth daily.    . nitroGLYCERIN (NITROSTAT) 0.4 MG SL tablet Place 1 tablet (0.4 mg total) under the tongue every 5 (five) minutes as needed for chest pain. 25 tablet 3  . Omega-3 Fatty Acids (FISH OIL CONCENTRATE PO)  Take 1 capsule by mouth daily.     . ondansetron (ZOFRAN) 8 MG tablet Take 1 tablet (8 mg total) by mouth every 8 (eight) hours as needed for nausea. 30 tablet 3  . vardenafil (LEVITRA) 10 MG tablet Take 10 mg by mouth as needed for erectile dysfunction.     Marland Kitchen aspirin 81 MG tablet Take 81 mg by mouth daily.    Marland Kitchen triamcinolone (NASACORT ALLERGY 24HR) 55 MCG/ACT AERO nasal inhaler Place 2 sprays into the nose daily.     No facility-administered medications prior to visit.     Review of Systems  Review of Systems  Constitutional: Negative.    HENT: Positive for congestion and postnasal drip.   Respiratory: Positive for cough and shortness of breath. Negative for wheezing.   Cardiovascular: Negative.     Physical Exam  BP 110/78 (BP Location: Left Arm, Cuff Size: Normal)   Pulse 93   Temp 98.5 F (36.9 C) (Oral)   SpO2 96%  Physical Exam Constitutional:      Appearance: Normal appearance.  Neck:     Musculoskeletal: Normal range of motion and neck supple.  Cardiovascular:     Rate and Rhythm: Normal rate.  Pulmonary:     Effort: Pulmonary effort is normal.     Breath sounds: Rhonchi present. No wheezing.  Musculoskeletal: Normal range of motion.  Skin:    General: Skin is warm and dry.  Neurological:     General: No focal deficit present.     Mental Status: He is alert. Mental status is at baseline.  Psychiatric:        Mood and Affect: Mood normal.        Behavior: Behavior normal.        Thought Content: Thought content normal.        Judgment: Judgment normal.      Lab Results:  CBC    Component Value Date/Time   WBC 9.6 09/10/2018 1432   RBC 4.62 09/10/2018 1432   HGB 14.2 09/10/2018 1432   HGB 9.6 (L) 04/08/2018 1303   HGB 15.7 07/11/2016 1503   HGB 15.1 09/25/2015 1119   HCT 45.8 09/10/2018 1432   HCT 47.3 07/11/2016 1503   HCT 46.7 09/25/2015 1119   PLT 223 09/10/2018 1432   PLT 118 (L) 04/08/2018 1303   PLT 171 07/11/2016 1503   MCV 99.1 09/10/2018 1432   MCV 96 07/11/2016 1503   MCV 92.9 09/25/2015 1119   MCH 30.7 09/10/2018 1432   MCHC 31.0 09/10/2018 1432   RDW 15.9 (H) 09/10/2018 1432   RDW 14.1 07/11/2016 1503   RDW 14.3 09/25/2015 1119   LYMPHSABS 0.5 (L) 09/10/2018 1432   LYMPHSABS 9.1 (H) 09/25/2015 1119   MONOABS 0.9 09/10/2018 1432   MONOABS 0.8 09/25/2015 1119   EOSABS 0.4 09/10/2018 1432   EOSABS 0.1 09/25/2015 1119   BASOSABS 0.1 09/10/2018 1432   BASOSABS 0.1 09/25/2015 1119    BMET    Component Value Date/Time   NA 136 09/10/2018 1432   NA 144 07/11/2016  1503   NA 141 02/28/2015 1444   K 4.6 09/10/2018 1432   K 4.5 02/28/2015 1444   CL 98 09/10/2018 1432   CO2 28 09/10/2018 1432   CO2 29 02/28/2015 1444   GLUCOSE 110 (H) 09/10/2018 1432   GLUCOSE 97 02/28/2015 1444   BUN 19 09/10/2018 1432   BUN 21 07/11/2016 1503   BUN 16.1 02/28/2015 1444   CREATININE 0.74 09/10/2018 1432  CREATININE 1.0 02/28/2015 1444   CALCIUM 9.1 09/10/2018 1432   CALCIUM 8.9 02/28/2015 1444   GFRNONAA >60 09/10/2018 1432   GFRAA >60 09/10/2018 1432    BNP No results found for: BNP  ProBNP No results found for: PROBNP  Imaging: Dg Chest 2 View  Result Date: 09/18/2018 CLINICAL DATA:  Shortness of breath EXAM: CHEST - 2 VIEW COMPARISON:  09/16/2018 FINDINGS: Cardiac shadow is stable. The lungs are hyperinflated. Mild patchy bronchitic markings are noted as well as more focal right middle lobe atelectasis. No pneumothorax is noted. IMPRESSION: Progressive atelectasis in the right middle lobe. Mild interstitial changes are noted slightly increased from the prior exam. No effusion or pneumothorax is seen. Electronically Signed   By: Inez Catalina M.D.   On: 09/18/2018 11:42   Dg Chest Port 1 View  Result Date: 09/16/2018 CLINICAL DATA:  Status post bronchoscopy EXAM: PORTABLE CHEST 1 VIEW COMPARISON:  08/11/2018 FINDINGS: Cardiac shadow is within normal limits. Lungs are well aerated bilaterally. No pneumothorax is noted. The known right lower lobe mass lesion is not appreciated on this exam given its location adjacent to the heart shadow and mediastinum. IMPRESSION: No pneumothorax following bronchoscopy. Electronically Signed   By: Inez Catalina M.D.   On: 09/16/2018 10:55   Dg C-arm Bronchoscopy  Result Date: 09/16/2018 C-ARM BRONCHOSCOPY: Fluoroscopy was utilized by the requesting physician.  No radiographic interpretation.     Assessment & Plan:   Chronic cough - Per Dr. Lamonte Sakai patient had a lot of mucus during bronchoscopy  - Respiratory culture  have shown no growth x 2 days - He has completed course of doxycycline, Levaquin and augmentin - Advised patient take mucinex twice daily - Use flutter valve 3-4 times day for congestion - Tesslone perles at night for cough suppression    Shortness of breath - Shortness of breath lying down since bronchoscopy on 8/12 - CXR showed no evidence of effusion or pneumothorax; progressive atelectasis in the right middle lobe. Mild interstitial changes are noted slightly increased from the prior exam. - Ambulatory O2 with no desaturation  - RX albuterol rescue inahler 2 puffs every 4-6 hours for shortness of breath  - Encouraged patient to use IS every hour  - FU next week as scheduled  - Advised ED if patient develops chest pain or worsening sob  Abnormal CT of the chest - S/p flex bronchoscopy with navigation 8/12 for RLL nodule - Pathology negative for malignancy  - Cultures are negative to date    Martyn Ehrich, NP 09/18/2018

## 2018-09-18 NOTE — Assessment & Plan Note (Addendum)
-   S/p flex bronchoscopy with navigation 8/12 for RLL nodule - Pathology negative for malignancy  - Cultures are negative to date

## 2018-09-18 NOTE — Patient Instructions (Addendum)
Recommendations: Take mucinex twice daily Use incentive spirometry every hour while awake Use flutter valve 3-4 times day for congestion  Use albuterol rescue inahler 2 puffs every 4-6 hours for shortness of breath  Take tesslone perles at night for cough suppression   Follow up next week as scheduled  ED if you develop chest pain or worsening shortness of breath

## 2018-09-18 NOTE — Telephone Encounter (Signed)
Call returned to patient, he reports he had a Bronch on Wednesday. He reports early in the AM he is having some increased SOB that is worse at night. He is taking benzonatate for the cough. He reports his phlegm is yellow. Also reports increased congestion. He reports he is using his Nasal spray without much relief. He states he is having to keep himself elevated with pillows to take a deep breath. Mychart video visit made. Voiced understanding. Nothing further is needed at this time.

## 2018-09-18 NOTE — Telephone Encounter (Signed)
Please let the patient know that there is no evidence for any cancer cells in any of his biopsies from bronchoscopy on 8/12. This is good news. We are still waiting for culture information, will need to update him going forward as results come in.

## 2018-09-21 NOTE — Telephone Encounter (Signed)
RB please advise on additional email.  Thanks!   Dr Lamonte Sakai  I was so glad that the pathology report showed no cancer. One question . Does that report eliminate lymphoma of the lung also? The nurse thought so but not Totally sure.   Thank you for all your good work!   Jake Kennedy

## 2018-09-21 NOTE — Telephone Encounter (Signed)
Please let him know that I called the pathology office today to insure that there was no residual testing pending. They confirmed that all appropriate testing has been done and has been reported out >> no evidence to support malignancy or pulmonary lymphoma. This is good news.  The culture information is still pending and we will discuss going forward.

## 2018-09-22 ENCOUNTER — Ambulatory Visit (INDEPENDENT_AMBULATORY_CARE_PROVIDER_SITE_OTHER): Payer: BC Managed Care – PPO | Admitting: Adult Health

## 2018-09-22 ENCOUNTER — Encounter: Payer: Self-pay | Admitting: Adult Health

## 2018-09-22 ENCOUNTER — Other Ambulatory Visit: Payer: Self-pay

## 2018-09-22 DIAGNOSIS — R9389 Abnormal findings on diagnostic imaging of other specified body structures: Secondary | ICD-10-CM | POA: Diagnosis not present

## 2018-09-22 DIAGNOSIS — R911 Solitary pulmonary nodule: Secondary | ICD-10-CM

## 2018-09-22 DIAGNOSIS — C9110 Chronic lymphocytic leukemia of B-cell type not having achieved remission: Secondary | ICD-10-CM | POA: Diagnosis not present

## 2018-09-22 MED ORDER — BUDESONIDE-FORMOTEROL FUMARATE 80-4.5 MCG/ACT IN AERO: 2.0000 | INHALATION_SPRAY | Freq: Two times a day (BID) | RESPIRATORY_TRACT | 0 refills | Status: DC

## 2018-09-22 MED ORDER — LEVOFLOXACIN 500 MG PO TABS: 500.0000 mg | ORAL_TABLET | Freq: Every day | ORAL | 0 refills | Status: AC

## 2018-09-22 NOTE — Patient Instructions (Signed)
Levaquin 500mg  daily for 10 days . Take with food. Begin Probiotic daily .  Mucinex Twice daily  As needed  Cough/congestion  Flutter valve Three times a day.  Activity as tolerated.  Begin Symbicort 80 Sample 2 puffs Twice daily  , take until sample is gone.  Albuterol Inhaler 2 puffs every 4hrs as needed.  Fluids and rest .  CT chest in 2 weeks  Saline nasal rinses Twice daily  .  Flonase 1-2 puffs daily  Claritin 10mg  At bedtime  As needed  Drainage .  Follow up with Dr. Lamonte Sakai  As planned and As needed   Please contact office for sooner follow up if symptoms do not improve or worsen or seek emergency care

## 2018-09-22 NOTE — Assessment & Plan Note (Signed)
Continue follow-up with oncology 

## 2018-09-22 NOTE — Progress Notes (Signed)
_0  ID: Jake Kennedy, male    DOB: 03/16/1950, 68 y.o.   MRN: [id number]  Chief Complaint  Patient presents with  . Follow-up    lung mass     Referring provider: [name], MD  HPI: 68 year old male never smoker with underlying history of chronic lymphocytic leukemia (diagnosed 2017) Medical history significant for coronary artery disease TEST/EVENTS :   09/22/2018 Follow up : Lung mass  Patient was diagnosed with chronic lymphocytic leukemia in 2017 was considered stable and was monitored on a regular basis until earlier this year.  Patient started to have increased fatigue at the beginning of 2020.  He started to have some nocturnal fevers night sweats and weight loss.  Activity tolerance had also decreased.  CT showed multiple enlarged cervical lymph nodes marked adenopathy within the chest, abdomen, pelvis.  Splenomegaly.  Right greater than left basilar nodularity.  He was started on Venetoclax and dexamethasone he said that his symptomology did improve .  Says he was able to begin exercising and his activity tolerance improved.  In June he began [place].  He says beginning in mid June he started to feel very bad with cough congestion low-grade fevers general malaise anorexia and weight loss.  CT chest August 11, 2018 showed improvement on the by basilar nodularity.  But new development of a masslike consolidative opacity in the medial right lung measuring 3.3 cm. Patient was referred to pulmonology and was set up for video bronchoscopy with endobronchial navigation.  Pathology showed benign lung parenchyma and no malignant cells noted.  BAL/Sputum cx was negative.  Fungal and AFB cultures are negative to date. Patient says that he continues to feel poorly with productive cough with thick mucus general malaise low activity tolerance and poor appetite.  Patient was seen last week after bronchoscopy chest x-ray showed no pneumothorax.  And no acute process. Patient was recommended to  begin Mucinex and flutter valve.  Patient says he is doing this but has not seen a lot of improvement.  Patient says he has been off of CLL treatment since June.  He has had several antibiotics including Augmentin and Levaquin and doxycycline.  Was seen by ENT and treated for a sinus infection.  Has also had one course of steroids without any significant improvement.  Symptoms have been waxing and waning over the last 8 months.  Prior to his diagnosis of CLL.  He says he had no known respiratory issues.  He was very active exercise and is a never smoker.  He says he does still try to work.  He does work from home mostly does work.  He has no known occupational exposures.  He does not have a basement or hot tub.  He has no pets no birds or chickens has no extensive travel.  Says he coughs up very thick mucus at times.  It is discolored intermittently.    Allergies  Allergen Reactions  . Adhesive [Tape]     Blisters & hives - Paper tape ok  . Latex Itching    Blisters and itching - pt states that it's not Latex but it's adhesives    Immunization History  Administered Date(s) Administered  . Influenza, High Dose Seasonal PF 10/23/2017  . Influenza-Unspecified 11/10/2014    Past Medical History:  Diagnosis Date  . Basal cell carcinoma    moles removed in 2007 and 2012 from L nose and L hip  . BPH (benign prostatic hypertrophy)   . CAD (coronary artery  disease), native coronary artery    a. 02/10/2015 95% mid LAD stenosis s/p DES  b. 07/15/16: LHC showed stable non obst dz and patent stent  . CLL (chronic lymphocytic leukemia) ([facility])   . Coronary artery disease   . ED (erectile dysfunction)   . Lymphocytosis 02/11/2015   seen by Dr. Lindi Adie 02/11/2015, working up for possible CLL    Tobacco History: Social History   Tobacco Use  Smoking Status Never Smoker  Smokeless Tobacco Never Used   Counseling given: Not Answered   Outpatient Medications Prior to Visit  Medication Sig Dispense  Refill  . acetaminophen (TYLENOL) 500 MG tablet Take 1,000 mg by mouth every 6 (six) hours as needed.    Marland Kitchen albuterol (VENTOLIN HFA) 108 (90 Base) MCG/ACT inhaler Inhale 2 puffs into the lungs every 6 (six) hours as needed for wheezing or shortness of breath. 6.7 g 0  . aspirin 81 MG tablet Take 81 mg by mouth daily.    Marland Kitchen atorvastatin (LIPITOR) 80 MG tablet Take 1 tablet (80 mg total) by mouth daily at 6 PM. 90 tablet 3  . benzonatate (TESSALON PERLES) 100 MG capsule Take 1 capsule (100 mg total) by mouth every 6 (six) hours as needed for cough. 45 capsule 0  . Multiple Vitamin (MULTIVITAMIN) tablet Take 1 tablet by mouth daily.    . Multiple Vitamins-Minerals (PRESERVISION AREDS PO) Take 2 capsules by mouth daily.    . nitroGLYCERIN (NITROSTAT) 0.4 MG SL tablet Place 1 tablet (0.4 mg total) under the tongue every 5 (five) minutes as needed for chest pain. 25 tablet 3  . Omega-3 Fatty Acids (FISH OIL CONCENTRATE PO) Take 1 capsule by mouth daily.     . ondansetron (ZOFRAN) 8 MG tablet Take 1 tablet (8 mg total) by mouth every 8 (eight) hours as needed for nausea. 30 tablet 3  . Respiratory Therapy Supplies (FLUTTER) DEVI Use as directed 1 each 0  . triamcinolone (NASACORT ALLERGY 24HR) 55 MCG/ACT AERO nasal inhaler Place 2 sprays into the nose daily.    . vardenafil (LEVITRA) 10 MG tablet Take 10 mg by mouth as needed for erectile dysfunction.      No facility-administered medications prior to visit.      Review of Systems:   Constitutional:  +weight loss, night sweats,    +chills, fatigue, or  lassitude.  HEENT:   No headaches,  Difficulty swallowing,  Tooth/dental problems, or  Sore throat,                No sneezing, itching, ear ache,  +nasal congestion, post nasal drip,   CV:  No chest pain,  Orthopnea, PND, swelling in lower extremities, anasarca, dizziness, palpitations, syncope.   GI  No heartburn, indigestion, abdominal pain, nausea, vomiting, diarrhea, change in bowel habits,  loss of appetite, bloody stools.   Resp:    No chest wall deformity  Skin: no rash or lesions.  GU: no dysuria, change in color of urine, no urgency or frequency.  No flank pain, no hematuria   MS:  No joint pain or swelling.  No decreased range of motion.  No back pain.    Physical Exam  BP 114/72   Pulse 78   Temp (!) 97.2 F (36.2 C) (Oral)   Ht _0  (1.88 m)   Wt 153 lb 9.6 oz (69.7 kg)   SpO2 93%   BMI 19.72 kg/m   GEN: A/Ox3; pleasant , NAD, thin, cachectic, chronically ill-appearing   HEENT:  [place]/AT,   NOSE-clear, THROAT-clear, no lesions, no postnasal drip or exudate noted.   NECK:  Supple w/ fair ROM; no JVD; normal carotid impulses w/o bruits; no thyromegaly or nodules palpated; no lymphadenopathy.    RESP scattered rhonchi bilaterally  no accessory muscle use, no dullness to percussion  CARD:  RRR, no m/r/g, no peripheral edema, pulses intact, no cyanosis or clubbing.  GI:   Soft & nt; nml bowel sounds; no organomegaly or masses detected.   Musco: Warm bil, no deformities or joint swelling noted.   Neuro: alert, no focal deficits noted.    Skin: Warm, no lesions or rashes    Lab Results:  CBC    Component Value Date/Time   WBC 9.6 09/10/2018 1432   RBC 4.62 09/10/2018 1432   HGB 14.2 09/10/2018 1432   HGB 9.6 (L) 04/08/2018 1303   HGB 15.7 07/11/2016 1503   HGB 15.1 09/25/2015 1119   HCT 45.8 09/10/2018 1432   HCT 47.3 07/11/2016 1503   HCT 46.7 09/25/2015 1119   PLT 223 09/10/2018 1432   PLT 118 (L) 04/08/2018 1303   PLT 171 07/11/2016 1503   MCV 99.1 09/10/2018 1432   MCV 96 07/11/2016 1503   MCV 92.9 09/25/2015 1119   MCH 30.7 09/10/2018 1432   MCHC 31.0 09/10/2018 1432   RDW 15.9 (H) 09/10/2018 1432   RDW 14.1 07/11/2016 1503   RDW 14.3 09/25/2015 1119   LYMPHSABS 0.5 (L) 09/10/2018 1432   LYMPHSABS 9.1 (H) 09/25/2015 1119   MONOABS 0.9 09/10/2018 1432   MONOABS 0.8 09/25/2015 1119   EOSABS 0.4 09/10/2018 1432   EOSABS 0.1  09/25/2015 1119   BASOSABS 0.1 09/10/2018 1432   BASOSABS 0.1 09/25/2015 1119    BMET    Component Value Date/Time   NA 136 09/10/2018 1432   NA 144 07/11/2016 1503   NA 141 02/28/2015 1444   K 4.6 09/10/2018 1432   K 4.5 02/28/2015 1444   CL 98 09/10/2018 1432   CO2 28 09/10/2018 1432   CO2 29 02/28/2015 1444   GLUCOSE 110 (H) 09/10/2018 1432   GLUCOSE 97 02/28/2015 1444   BUN 19 09/10/2018 1432   BUN 21 07/11/2016 1503   BUN 16.1 02/28/2015 1444   CREATININE 0.74 09/10/2018 1432   CREATININE 1.0 02/28/2015 1444   CALCIUM 9.1 09/10/2018 1432   CALCIUM 8.9 02/28/2015 1444   GFRNONAA >60 09/10/2018 1432   GFRAA >60 09/10/2018 1432    BNP No results found for: BNP  ProBNP No results found for: PROBNP  Imaging: Dg Chest 2 View  Result Date: 09/18/2018 CLINICAL DATA:  Shortness of breath EXAM: CHEST - 2 VIEW COMPARISON:  09/16/2018 FINDINGS: Cardiac shadow is stable. The lungs are hyperinflated. Mild patchy bronchitic markings are noted as well as more focal right middle lobe atelectasis. No pneumothorax is noted. IMPRESSION: Progressive atelectasis in the right middle lobe. Mild interstitial changes are noted slightly increased from the prior exam. No effusion or pneumothorax is seen. Electronically Signed   By: Inez Catalina M.D.   On: 09/18/2018 11:42   Dg Chest Port 1 View  Result Date: 09/16/2018 CLINICAL DATA:  Status post bronchoscopy EXAM: PORTABLE CHEST 1 VIEW COMPARISON:  08/11/2018 FINDINGS: Cardiac shadow is within normal limits. Lungs are well aerated bilaterally. No pneumothorax is noted. The known right lower lobe mass lesion is not appreciated on this exam given its location adjacent to the heart shadow and mediastinum. IMPRESSION: No pneumothorax following bronchoscopy. Electronically Signed  By: Inez Catalina M.D.   On: 09/16/2018 10:55   Dg C-arm Bronchoscopy  Result Date: 09/16/2018 C-ARM BRONCHOSCOPY: Fluoroscopy was utilized by the requesting physician.   No radiographic interpretation.    acetaminophen (TYLENOL) tablet 650 mg    Date Action Dose Route User   Discharged on 09/16/2018   Admitted on 09/16/2018   07/31/2018 1355 Given 650 mg Oral Georgianne Fick, RN    acetaminophen (TYLENOL) tablet 650 mg    Date Action Dose Route User   Discharged on 09/16/2018   Admitted on 09/16/2018   07/31/2018 1004 Given 650 mg Oral Alfonse Flavors C, RN    dexamethasone (DECADRON) 20 mg in sodium chloride 0.9 % 50 mL IVPB    Date Action Dose Route User   Discharged on 09/16/2018   Admitted on 09/16/2018   07/31/2018 1037 Rate/Dose Verify (none) (none) Georgianne Fick, RN   07/31/2018 1036 New Bag/Given 20 mg Intravenous Alfonse Flavors C, RN    diphenhydrAMINE (BENADRYL) tablet 25 mg    Date Action Dose Route User   Discharged on 09/16/2018   Admitted on 09/16/2018   07/31/2018 1355 Given 25 mg Oral Georgianne Fick, RN    diphenhydrAMINE (BENADRYL) injection 50 mg    Date Action Dose Route User   Discharged on 09/16/2018   Admitted on 09/16/2018   07/31/2018 1008 Given 50 mg Intravenous Georgianne Fick, RN    famotidine (PEPCID) IVPB 20 mg premix    Date Action Dose Route User   Discharged on 09/16/2018   Admitted on 09/16/2018   07/31/2018 1017 Rate/Dose Verify (none) (none) Georgianne Fick, RN   07/31/2018 1012 New Bag/Given 20 mg Intravenous Alfonse Flavors C, RN    meperidine (DEMEROL) injection 25 mg    Date Action Dose Route User   Discharged on 09/16/2018   Admitted on 09/16/2018   07/31/2018 1010 Given 25 mg Intravenous Georgianne Fick, RN    obinutuzumab (GAZYVA) 1,000 mg in sodium chloride 0.9 % 250 mL (3.4483 mg/mL) chemo infusion    Date Action Dose Route User   Discharged on 09/16/2018   Admitted on 09/16/2018   07/31/2018 1418 Rate/Dose Change (none) Intravenous Georgianne Fick, RN   07/31/2018 1347 Rate/Dose Change (none) Intravenous  Georgianne Fick, RN   07/31/2018 1315 Rate/Dose Change (none) Intravenous Georgianne Fick, RN   07/31/2018 1242 Rate/Dose Change (none) Intravenous Veverly Fells, RN   07/31/2018 1204 New Bag/Given 1000 mg Intravenous Alfonse Flavors C, RN    0.9 %  sodium chloride infusion    Date Action Dose Route User   Discharged on 09/16/2018   Admitted on 09/16/2018   07/31/2018 1553 Rate/Dose Change (none) (none) Georgianne Fick, RN   07/31/2018 1417 Restarted (none) (none) Georgianne Fick, RN   07/31/2018 1346 Restarted (none) (none) Georgianne Fick, RN   07/31/2018 1314 Restarted (none) (none) Georgianne Fick, RN   07/31/2018 1240 Restarted (none) (none) [place], [place], RN      No flowsheet data found.  No results found for: NITRICOXIDE      Assessment & Plan:   Abnormal CT of the chest Abnormal CT chest in the setting of CLL /active tx last in June 2020  Initial bibasilar nodularity has improved significantly after undergoing CLL treatment.  However he has developed a right medial consolidation measuring 3.3 cm.  Bronchoscopy was unrevealing/nondiagnostic with negative malignant cells and cultures are negative.  This is possible this is a  unresolving pneumonia as patient is immunosuppressed, deconditioned.  We will give a 10-day course of Levaquin increase his pulmonary hygiene regimen and add in ICS /LABA briefly , flutter  We will check CT chest in 2 weeks.  With close follow-up.  Have advised patient if symptoms worsen he is unable to eat or drink then we will need to consider hospitalization and further evaluation and treatment options.  Currently his vital signs are stable.  Patient is eating and drinking.  We will continue with outpatient therapy as tolerated.  Discussed case with Dr. Lamonte Sakai  Plan  Patient Instructions  Levaquin 573m daily for 10 days . Take with food. Begin Probiotic daily .  Mucinex Twice  daily  As needed  Cough/congestion  Flutter valve Three times a day.  Activity as tolerated.  Begin Symbicort 80 Sample 2 puffs Twice daily  , take until sample is gone.  Albuterol Inhaler 2 puffs every 4hrs as needed.  Fluids and rest .  CT chest in 2 weeks  Saline nasal rinses Twice daily  .  Flonase 1-2 puffs daily  Claritin 19mAt bedtime  As needed  Drainage .  Follow up with Dr. ByLamonte SakaiAs planned and As needed   Please contact office for sooner follow up if symptoms do not improve or worsen or seek emergency care        Chronic lymphocytic leukemia (CLL), B-cell ([place]CC[place]umContinue follow-up with oncology.     TaRexene EdisonNP 09/22/2018

## 2018-09-22 NOTE — Assessment & Plan Note (Signed)
Abnormal CT chest in the setting of CLL /active tx last in June 2020  Initial bibasilar nodularity has improved significantly after undergoing CLL treatment.  However he has developed a right medial consolidation measuring 3.3 cm.  Bronchoscopy was unrevealing/nondiagnostic with negative malignant cells and cultures are negative.  This is possible this is a unresolving pneumonia as patient is immunosuppressed, deconditioned.  We will give a 10-day course of Levaquin increase his pulmonary hygiene regimen and add in ICS /LABA briefly , flutter  We will check CT chest in 2 weeks.  With close follow-up.  Have advised patient if symptoms worsen he is unable to eat or drink then we will need to consider hospitalization and further evaluation and treatment options.  Currently his vital signs are stable.  Patient is eating and drinking.  We will continue with outpatient therapy as tolerated.  Discussed case with Dr. Lamonte Sakai  Plan  Patient Instructions  Levaquin 500mg  daily for 10 days . Take with food. Begin Probiotic daily .  Mucinex Twice daily  As needed  Cough/congestion  Flutter valve Three times a day.  Activity as tolerated.  Begin Symbicort 80 Sample 2 puffs Twice daily  , take until sample is gone.  Albuterol Inhaler 2 puffs every 4hrs as needed.  Fluids and rest .  CT chest in 2 weeks  Saline nasal rinses Twice daily  .  Flonase 1-2 puffs daily  Claritin 10mg  At bedtime  As needed  Drainage .  Follow up with Dr. Lamonte Sakai  As planned and As needed   Please contact office for sooner follow up if symptoms do not improve or worsen or seek emergency care

## 2018-09-22 NOTE — Telephone Encounter (Signed)
Correct. No evidence to support lymphoma

## 2018-09-23 ENCOUNTER — Encounter: Payer: Self-pay | Admitting: Hematology

## 2018-09-24 ENCOUNTER — Encounter: Payer: Self-pay | Admitting: Hematology

## 2018-09-28 ENCOUNTER — Encounter: Payer: Self-pay | Admitting: Cardiovascular Disease

## 2018-09-28 ENCOUNTER — Other Ambulatory Visit: Payer: Self-pay

## 2018-09-28 ENCOUNTER — Ambulatory Visit (INDEPENDENT_AMBULATORY_CARE_PROVIDER_SITE_OTHER): Payer: BC Managed Care – PPO | Admitting: Cardiovascular Disease

## 2018-09-28 VITALS — BP 106/68 | HR 76 | Ht 74.0 in | Wt 155.0 lb

## 2018-09-28 DIAGNOSIS — R0602 Shortness of breath: Secondary | ICD-10-CM | POA: Diagnosis not present

## 2018-09-28 DIAGNOSIS — E782 Mixed hyperlipidemia: Secondary | ICD-10-CM

## 2018-09-28 DIAGNOSIS — I2510 Atherosclerotic heart disease of native coronary artery without angina pectoris: Secondary | ICD-10-CM

## 2018-09-28 NOTE — Patient Instructions (Signed)
Medication Instructions:  Your provider recommends that you continue on your current medications as directed. Please refer to the Current Medication list given to you today.    Labwork: None  Testing/Procedures: Your provider has requested that you have an echocardiogram. Echocardiography is a painless test that uses sound waves to create images of your heart. It provides your doctor with information about the size and shape of your heart and how well your heart's chambers and valves are working. This procedure takes approximately one hour. There are no restrictions for this procedure.  Follow-Up: Your provider wants you to follow-up in: 6 months with Dr. Burt Knack. You will receive a reminder letter in the mail two months in advance. If you don't receive a letter, please call our office to schedule the follow-up appointment.

## 2018-09-28 NOTE — Progress Notes (Signed)
Cardiology Office Note:    Date:  09/28/2018   ID:  Jake Kennedy, DOB Dec 22, 1950, MRN [id number]  PCP:  [name], MD  Cardiologist:  Sherren Mocha, MD  Electrophysiologist:  None   Referring MD: [name], MD   Chief Complaint  Patient presents with  . Shortness of Breath   History of Present Illness:    Jake Kennedy is a 68 y.o. male with a hx of coronary artery disease, presenting for follow-up evaluation.  The patient underwent stenting of the LAD in 2016 when he presented with chest discomfort and an abnormal nuclear scan.  He has had no recurrent ischemic events.  The patient has been diagnosed with CLL and has undergone treatment for this.  He has developed problems with cough and shortness of breath, recently undergoing bronchoscopy and biopsy.  This demonstrated no evidence of malignancy.  He has had problems with cough but this is improved with use of Symbicort.  He has had some chest pain in the right lower chest that he relates to coughing.  He denies any exertional chest pain or pressure.  He does complain of generalized fatigue.  His cough is not exacerbated at night and he is able to lie supine without any signs of orthopnea or PND.  Past Medical History:  Diagnosis Date  . Basal cell carcinoma    moles removed in 2007 and 2012 from L nose and L hip  . BPH (benign prostatic hypertrophy)   . CAD (coronary artery disease), native coronary artery    a. 02/10/2015 95% mid LAD stenosis s/p DES  b. 07/15/16: LHC showed stable non obst dz and patent stent  . CLL (chronic lymphocytic leukemia) ([place])   . Coronary artery disease   . ED (erectile dysfunction)   . Lymphocytosis 02/11/2015   seen by Dr. Lindi Adie 02/11/2015, working up for possible CLL    Past Surgical History:  Procedure Laterality Date  . CARDIAC CATHETERIZATION  02/10/2015  . CARDIAC CATHETERIZATION N/A 02/10/2015   Procedure: Left Heart Cath and Coronary Angiography;  Surgeon: Sherren Mocha, MD;  Location:  [facility];  Service: Cardiovascular;  Laterality: N/A;  . CARDIAC CATHETERIZATION N/A 02/10/2015   Procedure: Coronary Stent Intervention;  Surgeon: Sherren Mocha, MD;  Location: [facility];  Service: Cardiovascular;  Laterality: N/A;  . CORONARY STENT PLACEMENT  02/10/2015   DES to LAD  . LAMINECTOMY    . LEFT HEART CATH AND CORONARY ANGIOGRAPHY N/A 07/15/2016   Procedure: Left Heart Cath and Coronary Angiography;  Surgeon: Martinique, Peter M, MD;  Location: [facility];  Service: Cardiovascular;  Laterality: N/A;  . TONSILLECTOMY    . VIDEO BRONCHOSCOPY WITH ENDOBRONCHIAL NAVIGATION N/A 09/16/2018   Procedure: VIDEO BRONCHOSCOPY WITH ENDOBRONCHIAL NAVIGATION AND BIOPIES;  Surgeon: Collene Gobble, MD;  Location: [facility];  Service: Thoracic;  Laterality: N/A;    Current Medications: Current Meds  Medication Sig  . acetaminophen (TYLENOL) 500 MG tablet Take 1,000 mg by mouth every 6 (six) hours as needed.  Marland Kitchen albuterol (VENTOLIN HFA) 108 (90 Base) MCG/ACT inhaler Inhale 2 puffs into the lungs every 6 (six) hours as needed for wheezing or shortness of breath.  Marland Kitchen aspirin 81 MG tablet Take 81 mg by mouth daily.  Marland Kitchen atorvastatin (LIPITOR) 80 MG tablet Take 1 tablet (80 mg total) by mouth daily at 6 PM.  . benzonatate (TESSALON PERLES) 100 MG capsule Take 1 capsule (100 mg total) by mouth every 6 (six)  hours as needed for cough.  . budesonide-formoterol (SYMBICORT) 80-4.5 MCG/ACT inhaler Inhale 2 puffs into the lungs 2 (two) times daily.  Marland Kitchen levofloxacin (LEVAQUIN) 500 MG tablet Take 1 tablet (500 mg total) by mouth daily for 10 days.  . Multiple Vitamin (MULTIVITAMIN) tablet Take 1 tablet by mouth daily.  . Multiple Vitamins-Minerals (PRESERVISION AREDS PO) Take 2 capsules by mouth daily.  . nitroGLYCERIN (NITROSTAT) 0.4 MG SL tablet Place 1 tablet (0.4 mg total) under the tongue every 5 (five) minutes as needed for chest pain.  . Omega-3 Fatty Acids (FISH OIL CONCENTRATE PO) Take  1 capsule by mouth daily.   . ondansetron (ZOFRAN) 8 MG tablet Take 1 tablet (8 mg total) by mouth every 8 (eight) hours as needed for nausea.  Marland Kitchen Respiratory Therapy Supplies (FLUTTER) DEVI Use as directed  . triamcinolone (NASACORT ALLERGY 24HR) 55 MCG/ACT AERO nasal inhaler Place 2 sprays into the nose daily.  . vardenafil (LEVITRA) 10 MG tablet Take 10 mg by mouth as needed for erectile dysfunction.      Allergies:   Adhesive [tape] and Latex   Social History   Socioeconomic History  . Marital status: Married    Spouse name: Not on file  . Number of children: Not on file  . Years of education: Not on file  . Highest education level: Not on file  Occupational History  . Not on file  Social Needs  . Financial resource strain: Not on file  . Food insecurity    Worry: Not on file    Inability: Not on file  . Transportation needs    Medical: Not on file    Non-medical: Not on file  Tobacco Use  . Smoking status: Never Smoker  . Smokeless tobacco: Never Used  Substance and Sexual Activity  . Alcohol use: Yes    Alcohol/week: 0.0 standard drinks    Comment: occasional  . Drug use: No  . Sexual activity: Not on file  Lifestyle  . Physical activity    Days per week: Not on file    Minutes per session: Not on file  . Stress: Not on file  Relationships  . Social Herbalist on phone: Not on file    Gets together: Not on file    Attends religious service: Not on file    Active member of club or organization: Not on file    Attends meetings of clubs or organizations: Not on file    Relationship status: Not on file  Other Topics Concern  . Not on file  Social History Narrative  . Not on file     Family History: The patient's family history includes Diabetes in his mother; Healthy in his brother, brother, maternal grandfather, maternal grandmother, paternal grandfather, paternal grandmother, and sister; Heart attack in his mother; Heart failure in his mother.   ROS:   Please see the history of present illness.    Generalized fatigue.  All other systems reviewed and are negative.  EKGs/Labs/Other Studies Reviewed:    Recent Labs: 09/10/2018: ALT 22; BUN 19; Creatinine 0.74; Hemoglobin 14.2; Platelets 223; Potassium 4.6; Sodium 136  Recent Lipid Panel    Component Value Date/Time   CHOL 83 (L) 11/11/2017 1010   TRIG 74 11/11/2017 1010   HDL 23 (L) 11/11/2017 1010   CHOLHDL 3.6 11/11/2017 1010   CHOLHDL 3.6 07/14/2016 0406   VLDL 15 07/14/2016 0406   LDLCALC 45 11/11/2017 1010    Physical Exam:  VS:  BP 106/68   Pulse 76   Ht 6\' 2"  (1.88 m)   Wt 155 lb (70.3 kg)   SpO2 92%   BMI 19.90 kg/m     Wt Readings from Last 3 Encounters:  09/28/18 155 lb (70.3 kg)  09/22/18 153 lb 9.6 oz (69.7 kg)  09/16/18 158 lb (71.7 kg)     GEN:  Thin, otherwise well-appearing male in no acute distress HEENT: Normal NECK: No JVD; No carotid bruits LYMPHATICS: No lymphadenopathy CARDIAC: RRR, no murmurs, rubs, gallops RESPIRATORY:  Clear to auscultation without rales, wheezing or rhonchi  ABDOMEN: Soft, non-tender, non-distended MUSCULOSKELETAL:  No edema; No deformity  SKIN: Warm and dry NEUROLOGIC:  Alert and oriented x 3 PSYCHIATRIC:  Normal affect   ASSESSMENT:    1. SOB (shortness of breath)   2. Coronary artery disease involving native coronary artery of native heart without angina pectoris   3. Mixed hyperlipidemia    PLAN:    In order of problems listed above:  1. The patient's cardiopulmonary exam is benign today.  We will check an echocardiogram to make sure there is not a cardiac-related component to his shortness of breath considering his background of coronary artery disease.  Otherwise continue current therapy. 2. The patient is stable without any symptoms of angina.  He will remain on low-dose antiplatelet therapy with aspirin and a high intensity statin drug. 3. Lipids are at goal and reviewed today.  Last lipid panel from  November 11, 2017 shows a total cholesterol of 83, LDL 45, HDL 23.   Medication Adjustments/Labs and Tests Ordered: Current medicines are reviewed at length with the patient today.  Concerns regarding medicines are outlined above.  Orders Placed This Encounter  Procedures  . ECHOCARDIOGRAM COMPLETE   No orders of the defined types were placed in this encounter.   Patient Instructions  Medication Instructions:  Your provider recommends that you continue on your current medications as directed. Please refer to the Current Medication list given to you today.    Labwork: None  Testing/Procedures: Your provider has requested that you have an echocardiogram. Echocardiography is a painless test that uses sound waves to create images of your heart. It provides your doctor with information about the size and shape of your heart and how well your heart's chambers and valves are working. This procedure takes approximately one hour. There are no restrictions for this procedure.  Follow-Up: Your provider wants you to follow-up in: 6 months with Dr. Burt Knack. You will receive a reminder letter in the mail two months in advance. If you don't receive a letter, please call our office to schedule the follow-up appointment.      Signed, Sherren Mocha, MD  09/28/2018 8:26 AM    [place]

## 2018-10-01 ENCOUNTER — Other Ambulatory Visit: Payer: Self-pay

## 2018-10-01 ENCOUNTER — Ambulatory Visit (INDEPENDENT_AMBULATORY_CARE_PROVIDER_SITE_OTHER): Payer: BC Managed Care – PPO | Admitting: Emergency Medicine

## 2018-10-01 ENCOUNTER — Telehealth: Payer: Self-pay | Admitting: Hematology

## 2018-10-01 ENCOUNTER — Encounter: Payer: Self-pay | Admitting: Emergency Medicine

## 2018-10-01 DIAGNOSIS — R9389 Abnormal findings on diagnostic imaging of other specified body structures: Secondary | ICD-10-CM | POA: Diagnosis not present

## 2018-10-01 DIAGNOSIS — J209 Acute bronchitis, unspecified: Secondary | ICD-10-CM

## 2018-10-01 NOTE — Assessment & Plan Note (Signed)
Appears to be improved although he does still have some tan mucus.  All culture data from his bronchoscopy is negative.  Please continue Nasacort and Mucinex as you have been taking them. Finish Levaquin as planned. We will stop your Symbicort.  Please keep track of whether you feel like he missed this medication so that we can discuss it going forward. Keep albuterol available to use 2 puffs if needed for shortness of breath, chest tightness, wheezing, coughing. I would defer getting a flu shot right now since you have just been treated for suspected bronchitis.  You could get this in 2 to 3 weeks. Follow with Dr Lamonte Sakai in 3 - 4 weeks or sooner if you have any problems.

## 2018-10-01 NOTE — Progress Notes (Signed)
Subjective:    Patient ID: Jake Kennedy, male    DOB: 03/12/1950, 68 y.o.   MRN: [id number]  HPI 68 year old never smoker with a history of coronary disease, CLL.  Underwent chemotherapy with dexamethasone, Veneclax, but had to be stopped due to leukopenia. Then started obinutuzumab by Dr. Irene Limbo in June 2020. He has had a return of weakness, dyspnea, LAD, cough, wt loss. He is bringing up yellowish mucous, coughs every morning.   He is referred today for evaluation of an persistently abnormal CT chest.   CT chest on 7/7 was reviewed by me, shows a medial right lower lobe paraspinal opacity with an irregular border, diffuse bronchial wall thickening and some mild bronchiectatic change.  This is compared with a CT chest from 03/19/2018 and shows an interval improvement in base predominant peripheral peribronchovascular nodularity.  CT neck showed decreased cervical lymphadenopathy.  ROV 10/01/2018 --Jake Kennedy follows up today for his abnormal CT scan of the chest.  He has CLL followed by Dr. Irene Limbo, is currently on observation.  We performed bronchoscopy on 09/16/2018 to investigate a medial right lower lobe paraspinal opacity of unclear etiology.  All of his cytologies are negative, flow cytometry was not sent given the histo path.  Culture data is all negative so far, AFB not yet final.    We treated him for a sinusitis + bronchitis last week w levaquin - had yellow mucous. Symbicort was added empirically, started nasacort and mucinex. He is using albuterol bid. He is coughing less, has less sinus drainage. His next CT chest is next Friday. Still with some yellow sputum. He has a TTE scheduled for next week w Dr Burt Knack to eval for any possible cards component to his SOB.   Review of Systems  Constitutional: Positive for fatigue. Negative for fever and unexpected weight change.  HENT: Negative for congestion, dental problem, ear pain, nosebleeds, postnasal drip, rhinorrhea, sinus pressure, sneezing,  sore throat and trouble swallowing.   Eyes: Negative for redness and itching.  Respiratory: Negative for cough, chest tightness, shortness of breath and wheezing.   Cardiovascular: Negative for palpitations and leg swelling.  Gastrointestinal: Negative for nausea and vomiting.  Genitourinary: Negative for dysuria.  Musculoskeletal: Negative for joint swelling.  Skin: Negative for rash.  Neurological: Negative for headaches.  Hematological: Does not bruise/bleed easily.  Psychiatric/Behavioral: Negative for dysphoric mood. The patient is not nervous/anxious.     Past Medical History:  Diagnosis Date  . Basal cell carcinoma    moles removed in 2007 and 2012 from L nose and L hip  . BPH (benign prostatic hypertrophy)   . CAD (coronary artery disease), native coronary artery    a. 02/10/2015 95% mid LAD stenosis s/p DES  b. 07/15/16: LHC showed stable non obst dz and patent stent  . CLL (chronic lymphocytic leukemia) ([place])   . Coronary artery disease   . ED (erectile dysfunction)   . Lymphocytosis 02/11/2015   seen by Dr. Lindi Adie 02/11/2015, working up for possible CLL     Family History  Problem Relation Age of Onset  . Diabetes Mother   . Heart failure Mother   . Heart attack Mother        occured in 64s.   . Healthy Sister   . Healthy Brother   . Healthy Maternal Grandmother   . Healthy Maternal Grandfather   . Healthy Paternal Grandmother   . Healthy Paternal Grandfather   . Healthy Brother      Social History  Socioeconomic History  . Marital status: Married    Spouse name: Not on file  . Number of children: Not on file  . Years of education: Not on file  . Highest education level: Not on file  Occupational History  . Not on file  Social Needs  . Financial resource strain: Not on file  . Food insecurity    Worry: Not on file    Inability: Not on file  . Transportation needs    Medical: Not on file    Non-medical: Not on file  Tobacco Use  . Smoking status: Never  Smoker  . Smokeless tobacco: Never Used  Substance and Sexual Activity  . Alcohol use: Yes    Alcohol/week: 0.0 standard drinks    Comment: occasional  . Drug use: No  . Sexual activity: Not on file  Lifestyle  . Physical activity    Days per week: Not on file    Minutes per session: Not on file  . Stress: Not on file  Relationships  . Social Herbalist on phone: Not on file    Gets together: Not on file    Attends religious service: Not on file    Active member of club or organization: Not on file    Attends meetings of clubs or organizations: Not on file    Relationship status: Not on file  . Intimate partner violence    Fear of current or ex partner: Not on file    Emotionally abused: Not on file    Physically abused: Not on file    Forced sexual activity: Not on file  Other Topics Concern  . Not on file  Social History Narrative  . 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   Allergies  Allergen Reactions  . Adhesive [Tape]     Blisters & hives - Paper tape ok  . Latex Itching    Blisters and itching - pt states that it's not Latex but it's adhesives     Outpatient Medications Prior to Visit  Medication Sig Dispense Refill  . acetaminophen (TYLENOL) 500 MG tablet Take 1,000 mg by mouth every 6 (six) hours as needed.    Marland Kitchen albuterol (VENTOLIN HFA) 108 (90 Base) MCG/ACT inhaler Inhale 2 puffs into the lungs every 6 (six) hours as needed for wheezing or shortness of breath. 6.7 g 0  . aspirin 81 MG tablet Take 81 mg by mouth daily.    Marland Kitchen atorvastatin (LIPITOR) 80 MG tablet Take 1 tablet (80 mg total) by mouth daily at 6 PM. 90 tablet 3  . benzonatate (TESSALON PERLES) 100 MG capsule Take 1 capsule (100 mg total) by mouth every 6 (six) hours as needed for cough. 45 capsule 0  . budesonide-formoterol (SYMBICORT) 80-4.5 MCG/ACT inhaler Inhale 2 puffs into the lungs 2 (two) times daily. 1 Inhaler 0  . levofloxacin (LEVAQUIN)  500 MG tablet Take 1 tablet (500 mg total) by mouth daily for 10 days. 10 tablet 0  . Multiple Vitamin (MULTIVITAMIN) tablet Take 1 tablet by mouth daily.    . Multiple Vitamins-Minerals (PRESERVISION AREDS PO) Take 2 capsules by mouth daily.    . nitroGLYCERIN (NITROSTAT) 0.4 MG SL tablet Place 1 tablet (0.4 mg total) under the tongue every 5 (five) minutes as needed for chest pain. 25 tablet 3  . Omega-3 Fatty Acids (FISH OIL CONCENTRATE PO) Take 1 capsule by mouth daily.     Marland Kitchen  ondansetron (ZOFRAN) 8 MG tablet Take 1 tablet (8 mg total) by mouth every 8 (eight) hours as needed for nausea. 30 tablet 3  . Respiratory Therapy Supplies (FLUTTER) DEVI Use as directed 1 each 0  . triamcinolone (NASACORT ALLERGY 24HR) 55 MCG/ACT AERO nasal inhaler Place 2 sprays into the nose daily.    . vardenafil (LEVITRA) 10 MG tablet Take 10 mg by mouth as needed for erectile dysfunction.      No facility-administered medications prior to visit.         Objective:   Physical Exam Vitals:   10/01/18 0935  BP: 122/80  Pulse: 68  SpO2: 96%  Weight: 154 lb (69.9 kg)  Height: 6\' 2"  (1.88 m)   Gen: Pleasant, tall and thin, in no distress,  normal affect  ENT: No lesions,  mouth clear,  oropharynx clear, no postnasal drip  Neck: No JVD, no stridor  Lungs: No use of accessory muscles, no crackles or wheezing on normal respiration, no wheeze on forced expiration  Cardiovascular: RRR, heart sounds normal, no murmur or gallops, no peripheral edema  Musculoskeletal: No deformities, no cyanosis or clubbing  Neuro: alert, awake, non focal  Skin: Warm, no lesions or rash      Assessment & Plan:  Acute bronchitis Appears to be improved although he does still have some tan mucus.  All culture data from his bronchoscopy is negative.  Please continue Nasacort and Mucinex as you have been taking them. Finish Levaquin as planned. We will stop your Symbicort.  Please keep track of whether you feel like he  missed this medication so that we can discuss it going forward. Keep albuterol available to use 2 puffs if needed for shortness of breath, chest tightness, wheezing, coughing. I would defer getting a flu shot right now since you have just been treated for suspected bronchitis.  You could get this in 2 to 3 weeks. Follow with Dr Lamonte Sakai in 3 - 4 weeks or sooner if you have any problems.  Abnormal CT of the chest Pathology negative, no evidence of lymphoma or malignancy.  Similarly cultures are surprisingly negative.  Question whether the medial right lower lobe opacity may have been a pneumonia.  If so there should be interval change on his CT scan planned for 9/4.  He just completed another round of Levaquin, question superimposed bronchitis following bronchoscopy.  We need to see the CT scan, assess whether the opacity remains.  If so may need to be investigated further either with repeat bronchoscopy or other means.  [name], MD, PhD 10/01/2018, 10:01 AM [facility] [phone] or if no answer [phone]

## 2018-10-01 NOTE — Patient Instructions (Signed)
Please continue Nasacort and Mucinex as you have been taking them. Finish Levaquin as planned. We will stop your Symbicort.  Please keep track of whether you feel like he missed this medication so that we can discuss it going forward. Keep albuterol available to use 2 puffs if needed for shortness of breath, chest tightness, wheezing, coughing. Get your CT scan of the chest on 9/4 as planned.  We will need to review this afterwards. Follow with Dr. Irene Limbo after the CT scan as well to discuss any plans for future therapy of her CLL. I would defer getting a flu shot right now since you have just been treated for suspected bronchitis.  You could get this in 2 to 3 weeks. Follow with Dr Lamonte Sakai in 3 - 4 weeks or sooner if you have any problems.

## 2018-10-01 NOTE — Assessment & Plan Note (Signed)
Pathology negative, no evidence of lymphoma or malignancy.  Similarly cultures are surprisingly negative.  Question whether the medial right lower lobe opacity may have been a pneumonia.  If so there should be interval change on his CT scan planned for 9/4.  He just completed another round of Levaquin, question superimposed bronchitis following bronchoscopy.  We need to see the CT scan, assess whether the opacity remains.  If so may need to be investigated further either with repeat bronchoscopy or other means.

## 2018-10-01 NOTE — Telephone Encounter (Signed)
I talk with patient regarding reschedule according to MD

## 2018-10-08 ENCOUNTER — Encounter: Payer: Self-pay | Admitting: Hematology

## 2018-10-08 ENCOUNTER — Ambulatory Visit: Payer: BC Managed Care – PPO | Admitting: Hematology

## 2018-10-08 ENCOUNTER — Other Ambulatory Visit: Payer: BC Managed Care – PPO

## 2018-10-08 NOTE — Telephone Encounter (Signed)
Dr. Lamonte Sakai please see the message below from pt:  Dr Lamonte Sakai, I get my CT scan Friday and just wanted to update my status to use when reviewing the scan. I feel slightly better this week. Better appetite, breathing easier, much less sinus congestion, slightly less cough with phlegm but still cough up throughout each day, energy improved, and more restful nights(taking  only 1 benzonatate at bedtime only) . I still have cough with phlegm each day but  slightly less than last week, I still have no smell or taste, and energy is reduced but better. The only medications I am taking are mucinex, Albuterol, Nasacort, Lipitor, 0ne benzonatate at night,  low dose aspirin and vitamins, and using flutter device. I am still restricting my walking to a few minutes a couple times a day but feel stronger. Hope the scan shows improvement. thank you Jake Kennedy

## 2018-10-09 ENCOUNTER — Other Ambulatory Visit: Payer: Self-pay

## 2018-10-09 ENCOUNTER — Ambulatory Visit (HOSPITAL_COMMUNITY): Payer: BC Managed Care – PPO | Attending: Cardiovascular Disease

## 2018-10-09 ENCOUNTER — Other Ambulatory Visit: Payer: Self-pay | Admitting: Hematology

## 2018-10-09 ENCOUNTER — Ambulatory Visit (INDEPENDENT_AMBULATORY_CARE_PROVIDER_SITE_OTHER)
Admission: RE | Admit: 2018-10-09 | Discharge: 2018-10-09 | Disposition: A | Discharge status: Home / Self Care | Payer: BC Managed Care – PPO | Source: Ambulatory Visit | Attending: Adult Health | Admitting: Adult Health

## 2018-10-09 DIAGNOSIS — R0602 Shortness of breath: Secondary | ICD-10-CM | POA: Insufficient documentation

## 2018-10-09 DIAGNOSIS — R1310 Dysphagia, unspecified: Secondary | ICD-10-CM

## 2018-10-09 DIAGNOSIS — R918 Other nonspecific abnormal finding of lung field: Secondary | ICD-10-CM | POA: Diagnosis not present

## 2018-10-09 DIAGNOSIS — R911 Solitary pulmonary nodule: Secondary | ICD-10-CM

## 2018-10-15 LAB — FUNGUS CULTURE WITH STAIN

## 2018-10-15 LAB — FUNGUS CULTURE RESULT

## 2018-10-15 LAB — FUNGAL ORGANISM REFLEX

## 2018-10-15 NOTE — Progress Notes (Signed)
HEMATOLOGY/ONCOLOGY CLINIC NOTE  Date of Service: 10/16/2018  Patient Care Team: [name], MD as PCP - General (Internal Medicine) Sherren Mocha, MD as PCP - Cardiology (Cardiology)  CHIEF COMPLAINTS/PURPOSE OF CONSULTATION:  Chronic Lymphocytic Leukemia  Oncologic History:   Oncology History  Chronic lymphocytic leukemia (CLL), B-cell ([place])  02/14/2015 Initial Diagnosis   Monoclonal B-cell population CD5 and CD20 positive, differential diagnosis CLL versus mantle cell lymphoma ( patient had absolute lymphocyte count of 8000 in 2010), remained stable.   07/09/2018 -  Chemotherapy   The patient had obinutuzumab (GAZYVA) 100 mg in sodium chloride 0.9 % 100 mL (0.9615 mg/mL) chemo infusion, 100 mg, Intravenous, Once, 2 of 6 cycles Administration: 100 mg (07/09/2018), 900 mg (07/10/2018), 1,000 mg (07/31/2018)  for chemotherapy treatment.       HISTORY OF PRESENTING ILLNESS:  Jake Kennedy is a wonderful 68 y.o. male who has been referred to [place] by Dr. [name] for evaluation and management of Chronic Lymphocytic Leukemia. The pt was formerly under the care of my colleague Dr. Nicholas Lose. He is accompanied today by his wife. The pt reports that he is doing well overall.  The pt reports that his course of CLL had been pretty consistent since diagnosis in 2017, until roughly 3 months ago in roughly November 2019. He notes that he has had recent increase in fatigue in the last 3 months, and more acutely in the last 3 weeks. He endorses occasional fevers over the last two weeks during the night, highest at 100.2 or 100.3. He also endorses some night sweats, not every night, some of these being "drenching." He notes that he has been eating well for the last 6 months, however has lost 8 pounds in that interim. He endorses regular exercising until the past month, due to his worsened fatigue, and had previously regularly run upwards of 3 miles and bike for several miles as well.  The pt  notes that he can't smell very well, in the last 2-3 weeks, and his hearing has also reduced in the last week. He senses that his enlarged lymph nodes are pressing on his neck, and characterizes this as similar to when one has an acute cold and congestion. He had acute pain in his right hip a week ago and saw my colleague Sandi Mealy, PA in Symptom Managment. He was given Norco but denies taking this because he didn't want to. He endorses a positional element to his hip pain, which e attributes to the pain being from an enlarged lymph node. He notes that his hip pain had sudden onset on the night of 03/18/18. He notes that his neck lymph nodes have grown in the last 3 weeks to a month. The pt notes that his lymph nodes in his neck, armpits, and groin have been enlarged.  The pt tried an antibiotic course 2 weeks ago with his PCP for a suspected sinus infection, during which his cervical lymph nodes enlarged further. He notes that his neck lymph nodes are somewhat improved today as compared to this. The pt denies frequent infections.   Of note prior to the patient's visit today, pt has had CT N/C/A/P completed on 03/19/18 with results revealing NECK: Multiple enlarged bilateral cervical lymph nodes consistent with the given diagnosis of CLL. The largest node is a right level 3 lymph node measuring 3.4 x 2.8 x 1.2 cm. 2. Degenerative changes of the cervical spine. 3. Hypodense lesion in the right side of the tongue  measuring up to 1.5 cm. This could represent focal infection. Neoplasm is considered less likely. The lesion appears to be superficial along the right lateral aspect of the anterior tongue a. This should be amenable to direct inspection, C/A/P: Marked adenopathy within the chest, abdomen, and pelvis, consistent with active lymphoma/leukemia. 2. Splenomegaly is nonspecific. Splenic involvement cannot be excluded. 3. Right greater than left and basilar predominant peribronchovascular interstitial thickening  and nodularity. Considerations include pulmonary involvement of lymphoma and/or Infection/aspiration. 4. Coronary artery atherosclerosis. Aortic Atherosclerosis. 5. Trace nonspecific pelvic fluid.  Most recent lab results (03/17/18) of CBC w/diff and CMP is as follows: all values are WNL except for WBC at 13.1k, RBC at 2.67, HGB at 10.1, HCT at 31.0, MCV at 116.1, MCH at 37.8, PLT at 103k, ANC at 900, Lymphs abs at 11.1k, BUN at 31, Calcium at 8.7, Total Protein at 6.2, Albumin at 3.4. 03/17/18 LDH at 241  On review of systems, pt reports new fatigue, recent fevers, recent night sweats, recently grown neck lymph nodes, eating well, some weight loss, right groin pain, and denies chills, SOB, ear pain, pain along the spine, frequent infections, flank pain, leg swelling, testicular pain or swelling, and any other symptoms.   Interval History:  Jake Kennedy returns today for management and evaluation of his CLL. The patient's last visit with [place] was on 09/10/2018. The pt reports that he is doing well overall.  The pt reports that his father was a heavy smoker. He did not have severe infections of pneumonia. He notes that his sinus issues have improved. His cough has significantly improved, but he is still coughing up clear phlegm in the morning.   Of note since the patient's last visit, pt has had a chest CT without contrast completed on 10/09/2018 with results revealing "The index subpleural nodule within the paraspinal right lower lobe exhibits significant decrease in size from previous exam compatible with resolving inflammatory/infectious process. Subpleural nodular density within the left base is decreased in size from 03/19/2018. Also likely reflecting postinflammatory or infectious process. Scattered peripheral subpleural nodular densities within both lung bases are noted, likely post infectious or inflammatory. Bronchiectasis, bronchial wall thickening and peribronchovascular interstitial thickening  is again noted. Borderline axillary adenopathy is stable to improved in the interval. Aortic Atherosclerosis (ICD10-I70.0). Coronary artery calcifications."  Lab results today (10/16/18) of CBC w/diff and CMP is as follows: all values are WNL. 10/16/18 LDH at 184, which is WNL.  On review of systems, pt reports improved cough, weight gain,  and denies any other symptoms.    MEDICAL HISTORY:  Past Medical History:  Diagnosis Date   Basal cell carcinoma    moles removed in 2007 and 2012 from L nose and L hip   BPH (benign prostatic hypertrophy)    CAD (coronary artery disease), native coronary artery    a. 02/10/2015 95% mid LAD stenosis s/p DES  b. 07/15/16: LHC showed stable non obst dz and patent stent   CLL (chronic lymphocytic leukemia) ([place])    Coronary artery disease    ED (erectile dysfunction)    Lymphocytosis 02/11/2015   seen by Dr. Lindi Adie 02/11/2015, working up for possible CLL    SURGICAL HISTORY: Past Surgical History:  Procedure Laterality Date   CARDIAC CATHETERIZATION  02/10/2015   CARDIAC CATHETERIZATION N/A 02/10/2015   Procedure: Left Heart Cath and Coronary Angiography;  Surgeon: Sherren Mocha, MD;  Location: [facility];  Service: Cardiovascular;  Laterality: N/A;   CARDIAC CATHETERIZATION N/A  02/10/2015   Procedure: Coronary Stent Intervention;  Surgeon: Sherren Mocha, MD;  Location: [facility];  Service: Cardiovascular;  Laterality: N/A;   CORONARY STENT PLACEMENT  02/10/2015   DES to LAD   LAMINECTOMY     LEFT HEART CATH AND CORONARY ANGIOGRAPHY N/A 07/15/2016   Procedure: Left Heart Cath and Coronary Angiography;  Surgeon: Martinique, Peter M, MD;  Location: [facility];  Service: Cardiovascular;  Laterality: N/A;   TONSILLECTOMY     VIDEO BRONCHOSCOPY WITH ENDOBRONCHIAL NAVIGATION N/A 09/16/2018   Procedure: VIDEO BRONCHOSCOPY WITH ENDOBRONCHIAL NAVIGATION AND BIOPIES;  Surgeon: Collene Gobble, MD;  Location: [facility];  Service:  Thoracic;  Laterality: N/A;    SOCIAL HISTORY: Social History   Socioeconomic History   Marital status: Married    Spouse name: Not on file   Number of children: Not on file   Years of education: Not on file   Highest education level: Not on file  Occupational History   Not on file  Social Needs   Financial resource strain: Not on file   Food insecurity    Worry: Not on file    Inability: Not on file   Transportation needs    Medical: Not on file    Non-medical: Not on file  Tobacco Use   Smoking status: Never Smoker   Smokeless tobacco: Never Used  Substance and Sexual Activity   Alcohol use: Yes    Alcohol/week: 0.0 standard drinks    Comment: occasional   Drug use: No   Sexual activity: Not on file  Lifestyle   Physical activity    Days per week: Not on file    Minutes per session: Not on file   Stress: Not on file  Relationships   Social connections    Talks on phone: Not on file    Gets together: Not on file    Attends religious service: Not on file    Active member of club or organization: Not on file    Attends meetings of clubs or organizations: Not on file    Relationship status: Not on file   Intimate partner violence    Fear of current or ex partner: Not on file    Emotionally abused: Not on file    Physically abused: Not on file    Forced sexual activity: Not on file  Other Topics Concern   Not on file  Social History Narrative   Not on file    FAMILY HISTORY: Family History  Problem Relation Age of Onset   Diabetes Mother    Heart failure Mother    Heart attack Mother        occured in 46s.    Healthy Sister    Healthy Brother    Healthy Maternal Grandmother    Healthy Maternal Grandfather    Healthy Paternal Grandmother    Healthy Paternal Grandfather    Healthy Brother     ALLERGIES:  is allergic to adhesive [tape] and latex.  MEDICATIONS:  Current Outpatient Medications  Medication Sig Dispense  Refill   acetaminophen (TYLENOL) 500 MG tablet Take 1,000 mg by mouth every 6 (six) hours as needed.     albuterol (VENTOLIN HFA) 108 (90 Base) MCG/ACT inhaler Inhale 2 puffs into the lungs every 6 (six) hours as needed for wheezing or shortness of breath. 6.7 g 0   aspirin 81 MG tablet Take 81 mg by mouth daily.     atorvastatin (LIPITOR) 80 MG tablet  Take 1 tablet (80 mg total) by mouth daily at 6 PM. 90 tablet 3   benzonatate (TESSALON PERLES) 100 MG capsule Take 1 capsule (100 mg total) by mouth every 6 (six) hours as needed for cough. 45 capsule 0   budesonide-formoterol (SYMBICORT) 80-4.5 MCG/ACT inhaler Inhale 2 puffs into the lungs 2 (two) times daily. 1 Inhaler 0   Multiple Vitamin (MULTIVITAMIN) tablet Take 1 tablet by mouth daily.     Multiple Vitamins-Minerals (PRESERVISION AREDS PO) Take 2 capsules by mouth daily.     nitroGLYCERIN (NITROSTAT) 0.4 MG SL tablet Place 1 tablet (0.4 mg total) under the tongue every 5 (five) minutes as needed for chest pain. 25 tablet 3   Omega-3 Fatty Acids (FISH OIL CONCENTRATE PO) Take 1 capsule by mouth daily.      ondansetron (ZOFRAN) 8 MG tablet Take 1 tablet (8 mg total) by mouth every 8 (eight) hours as needed for nausea. 30 tablet 3   Respiratory Therapy Supplies (FLUTTER) DEVI Use as directed 1 each 0   triamcinolone (NASACORT ALLERGY 24HR) 55 MCG/ACT AERO nasal inhaler Place 2 sprays into the nose daily.     vardenafil (LEVITRA) 10 MG tablet Take 10 mg by mouth as needed for erectile dysfunction.      No current facility-administered medications for this visit.     REVIEW OF SYSTEMS:   A 10+ POINT REVIEW OF SYSTEMS WAS OBTAINED including neurology, dermatology, psychiatry, cardiac, respiratory, lymph, extremities, GI, GU, Musculoskeletal, constitutional, breasts, reproductive, HEENT.  All pertinent positives are noted in the HPI.  All others are negative.    PHYSICAL EXAMINATION: ECOG FS:1 - Symptomatic but completely  ambulatory  Vitals:   10/16/18 1219  BP: 121/82  Pulse: 79  Resp: 18  Temp: 98.5 F (36.9 C)  SpO2: 98%   Wt Readings from Last 3 Encounters:  10/16/18 162 lb 8 oz (73.7 kg)  10/01/18 154 lb (69.9 kg)  09/28/18 155 lb (70.3 kg)   Body mass index is 20.86 kg/m.    GENERAL:alert, in no acute distress and comfortable SKIN: no acute rashes, no significant lesions EYES: conjunctiva are pink and non-injected, sclera anicteric OROPHARYNX: MMM, no exudates, no oropharyngeal erythema or ulceration NECK: supple, no JVD LYMPH:  no palpable lymphadenopathy in the cervical, axillary or inguinal regions LUNGS: clear to auscultation b/l with normal respiratory effort HEART: regular rate & rhythm ABDOMEN:  normoactive bowel sounds , non tender, not distended. Extremity: no pedal edema PSYCH: alert & oriented x 3 with fluent speech NEURO: no focal motor/sensory deficits   LABORATORY DATA:  I have reviewed the data as listed  . CBC Latest Ref Rng & Units 10/16/2018 09/10/2018 08/13/2018  WBC 4.0 - 10.5 K/uL 5.7 9.6 8.5  Hemoglobin 13.0 - 17.0 g/dL 15.2 14.2 14.1  Hematocrit 39.0 - 52.0 % 48.7 45.8 44.3  Platelets 150 - 400 K/uL 215 223 311  ANC 700<---600<---1200  . CMP Latest Ref Rng & Units 10/16/2018 09/10/2018 08/13/2018  Glucose 70 - 99 mg/dL 95 110(H) 132(H)  BUN 8 - 23 mg/dL 17 19 18   Creatinine 0.61 - 1.24 mg/dL 0.80 0.74 0.83  Sodium 135 - 145 mmol/L 140 136 140  Potassium 3.5 - 5.1 mmol/L 4.6 4.6 4.9  Chloride 98 - 111 mmol/L 102 98 101  CO2 22 - 32 mmol/L 32 28 28  Calcium 8.9 - 10.3 mg/dL 9.2 9.1 9.1  Total Protein 6.5 - 8.1 g/dL 6.5 5.9(L) 6.6  Total Bilirubin 0.3 - 1.2 mg/dL 1.0  0.7 0.8  Alkaline Phos 38 - 126 U/L 104 79 85  AST 15 - 41 U/L 19 13(L) 15  ALT 0 - 44 U/L 34 22 23    04/30/18 Outside Labs:                RADIOGRAPHIC STUDIES: I have personally reviewed the radiological images as listed and agreed with the findings in the report. Dg Chest 2  View  Result Date: 09/18/2018 CLINICAL DATA:  Shortness of breath EXAM: CHEST - 2 VIEW COMPARISON:  09/16/2018 FINDINGS: Cardiac shadow is stable. The lungs are hyperinflated. Mild patchy bronchitic markings are noted as well as more focal right middle lobe atelectasis. No pneumothorax is noted. IMPRESSION: Progressive atelectasis in the right middle lobe. Mild interstitial changes are noted slightly increased from the prior exam. No effusion or pneumothorax is seen. Electronically Signed   By: Inez Catalina M.D.   On: 09/18/2018 11:42   Ct Chest Wo Contrast  Result Date: 10/09/2018 CLINICAL DATA:  Follow-up lung nodule. History of chronic lymphocytic leukemia. EXAM: CT CHEST WITHOUT CONTRAST TECHNIQUE: Multidetector CT imaging of the chest was performed following the standard protocol without IV contrast. COMPARISON:  08/11/2018 FINDINGS: Cardiovascular: Normal heart size. No pericardial effusion. Aortic atherosclerosis. RCA, LAD and left circumflex coronary artery calcifications. Mediastinum/Nodes: Bilateral low-attenuation thyroid nodules. The trachea appears patent and is midline. Normal appearance of the esophagus. Prominent axillary lymph nodes are again identified in keeping with the history of chronic lymphocytic leukemia. On today's exam these appear less conspicuous than on 08/11/2018. For example, left axillary lymph node measures 1.1 cm, image 35/2. Previously 1.2 cm. Adjacent node measures 0.8 cm, image 48/2. Previously 0.9 cm. No mediastinal or hilar adenopathy identified. Lungs/Pleura: There is diffuse bronchial wall thickening and bronchiectasis. No pleural effusion, or atelectasis. No pneumothorax. The previously identified subpleural nodule within the paraspinal right lower lobe is decreased in size from previous exam compatible with resolving inflammatory/infectious process. On today's exam this measures 0.7 x 0.8 cm, image 106/3. Previously 3.5 x 1.9 cm. In the left lung base there is a  persistent nodular density which measures 0.8 x 0.9 cm, image 134/3. This is decreased from 1.9 cm on 03/19/2018. Several new areas of subpleural nodular thickening within the posterior lung bases are identified which are likely post infectious or inflammatory in etiology. Upper Abdomen: Calcified granulomas identified within the liver. Peri splenic fluid is again noted. No acute abnormality. Musculoskeletal: No chest wall mass or suspicious bone lesions identified. IMPRESSION: 1. The index subpleural nodule within the paraspinal right lower lobe exhibits significant decrease in size from previous exam compatible with resolving inflammatory/infectious process. Subpleural nodular density within the left base is decreased in size from 03/19/2018. Also likely reflecting postinflammatory or infectious process. 2. Scattered peripheral subpleural nodular densities within both lung bases are noted, likely post infectious or inflammatory. 3. Bronchiectasis, bronchial wall thickening and peribronchovascular interstitial thickening is again noted. 4. Borderline axillary adenopathy is stable to improved in the interval. 5. Aortic Atherosclerosis (ICD10-I70.0). Coronary artery calcifications. Electronically Signed   By: Kerby Moors M.D.   On: 10/09/2018 15:05    ASSESSMENT & PLAN:  68 y.o. male with  1. Chronic Lymphocytic Leukemia Initial diagnosis on 02/13/15 Peripheral blood flow cytometry which revealed a Monoclonal B-cell population 09/25/15 Cytogenetics ruled out Mantle cell lymphoma. 12/25/17 FISH study ruled out translocation 11;14 12/30/17 BM Biopsy revealed CLL without evidence of transformation  03/17/18 FISH CLL Prognostic panel revealed an 11q deletion and Trisomy  12  03/19/18 CT N/C/A/P revealed NECK: Multiple enlarged bilateral cervical lymph nodes consistent with the given diagnosis of CLL. The largest node is a right level 3 lymph node measuring 3.4 x 2.8 x 1.2 cm. 2. Degenerative changes of the  cervical spine. 3. Hypodense lesion in the right side of the tongue measuring up to 1.5 cm. This could represent focal infection. Neoplasm is considered less likely. The lesion appears to be superficial along the right lateral aspect of the anterior tongue a. This should be amenable to direct inspection, C/A/P: Marked adenopathy within the chest, abdomen, and pelvis, consistent with active lymphoma/leukemia. 2. Splenomegaly is nonspecific. Splenic involvement cannot be excluded. 3. Right greater than left and basilar predominant peribronchovascular interstitial thickening and nodularity. Considerations include pulmonary involvement of lymphoma and/or Infection/aspiration. 4. Coronary artery atherosclerosis. Aortic Atherosclerosis. 5. Trace nonspecific pelvic fluid.  03/26/18 Left cervical LN biopsy revealed SLL/CLL  08/11/18 CT Neck revealed "Decreased cervical lymphadenopathy with all remaining nodes being subcentimeter in short axis. 2. Sinusitis."  08/11/18 CT Chest revealed "Interval improvement without resolution of the basilar predominant peripheral peribronchovascular nodularity. 2. Interval development of a new masslike consolidative opacity in the medial right lung measuring up to 3.3 cm. This may be infectious/inflammatory. Lymphoma involvement not excluded. 3. Interval resolution of splenomegaly with small volume perisplenic ascites identified today. 4.  Aortic Atherosclerosis."  #2 Lung Lesion -Pt is scheduled for a bronchoscopy on 09/16/2018 -Pt reports that he has not been out of the [place] much -No known contact with tuberculosis    PLAN: -Discussed pt labwork today, 10/16/18; all values are WNL -Discussed 10/16/18 LDH at 184, which is WNL. -Discussed thyroid function test -Discussed 10/09/2018 chest CT with results revealing "The index subpleural nodule within the paraspinal right lower lobe exhibits significant decrease in size from previous exam compatible with resolving  inflammatory/infectious process. Subpleural nodular density within the left base is decreased in size from 03/19/2018. Also likely reflecting postinflammatory or infectious process. Scattered peripheral subpleural nodular densities within both lung bases are noted, likely post infectious or inflammatory. Bronchiectasis, bronchial wall thickening and peribronchovascular interstitial thickening is again noted. Borderline axillary adenopathy is stable to improved in the interval. Aortic Atherosclerosis (ICD10-I70.0). Coronary artery calcifications." -Recommended that the pt continue to eat well, drink at least 48-64 oz of water each day, and walk 20-30 minutes each day.  -No evidence of active CLL at this time -Return in 3 months to discuss treatment; at this time his blood work is stable.   FOLLOW UP: US thyroid in 12 weeks RTC with dr Irene Limbo with labs in 3 months   The total time spent in the appt was 20 minutes and more than 50% was on counseling and direct patient cares.  All of the patient's questions were answered with apparent satisfaction. The patient knows to call the clinic with any problems, questions or concerns.     Sullivan Lone MD MS AAHIVMS [facility] Hematology/Oncology Physician [facility]  (Office):       [phone] (Work cell):  [phone] (Fax):           [phone]  10/16/2018 12:37 PM  I, Jacqualyn Posey, am acting as a Education administrator for Dr. Sullivan Lone.   .I have reviewed the above documentation for accuracy and completeness, and I agree with the above. Brunetta Genera MD

## 2018-10-16 ENCOUNTER — Inpatient Hospital Stay (HOSPITAL_BASED_OUTPATIENT_CLINIC_OR_DEPARTMENT_OTHER): Payer: BC Managed Care – PPO | Admitting: Hematology

## 2018-10-16 ENCOUNTER — Telehealth: Payer: Self-pay | Admitting: Hematology

## 2018-10-16 ENCOUNTER — Other Ambulatory Visit: Payer: Self-pay

## 2018-10-16 ENCOUNTER — Inpatient Hospital Stay: Payer: BC Managed Care – PPO | Attending: Hematology and Oncology

## 2018-10-16 VITALS — BP 121/82 | HR 79 | Temp 98.5°F | Resp 18 | Ht 74.0 in | Wt 162.5 lb

## 2018-10-16 DIAGNOSIS — C9110 Chronic lymphocytic leukemia of B-cell type not having achieved remission: Secondary | ICD-10-CM | POA: Diagnosis not present

## 2018-10-16 DIAGNOSIS — Z7982 Long term (current) use of aspirin: Secondary | ICD-10-CM | POA: Diagnosis not present

## 2018-10-16 DIAGNOSIS — R61 Generalized hyperhidrosis: Secondary | ICD-10-CM | POA: Diagnosis not present

## 2018-10-16 DIAGNOSIS — I700 Atherosclerosis of aorta: Secondary | ICD-10-CM | POA: Diagnosis not present

## 2018-10-16 DIAGNOSIS — N400 Enlarged prostate without lower urinary tract symptoms: Secondary | ICD-10-CM | POA: Insufficient documentation

## 2018-10-16 DIAGNOSIS — Z85828 Personal history of other malignant neoplasm of skin: Secondary | ICD-10-CM | POA: Insufficient documentation

## 2018-10-16 DIAGNOSIS — R1310 Dysphagia, unspecified: Secondary | ICD-10-CM

## 2018-10-16 DIAGNOSIS — Z79899 Other long term (current) drug therapy: Secondary | ICD-10-CM | POA: Insufficient documentation

## 2018-10-16 DIAGNOSIS — I2510 Atherosclerotic heart disease of native coronary artery without angina pectoris: Secondary | ICD-10-CM | POA: Insufficient documentation

## 2018-10-16 DIAGNOSIS — R918 Other nonspecific abnormal finding of lung field: Secondary | ICD-10-CM | POA: Insufficient documentation

## 2018-10-16 DIAGNOSIS — E041 Nontoxic single thyroid nodule: Secondary | ICD-10-CM | POA: Diagnosis not present

## 2018-10-16 LAB — CMP (CANCER CENTER ONLY)
ALT: 34 U/L (ref 0–44)
AST: 19 U/L (ref 15–41)
Albumin: 4.2 g/dL (ref 3.5–5.0)
Alkaline Phosphatase: 104 U/L (ref 38–126)
Anion gap: 6 (ref 5–15)
BUN: 17 mg/dL (ref 8–23)
CO2: 32 mmol/L (ref 22–32)
Calcium: 9.2 mg/dL (ref 8.9–10.3)
Chloride: 102 mmol/L (ref 98–111)
Creatinine: 0.8 mg/dL (ref 0.61–1.24)
GFR, Est AFR Am: >60 mL/min (ref >60)
GFR, Estimated: >60 mL/min (ref >60)
Glucose, Bld: 95 mg/dL (ref 70–99)
Potassium: 4.6 mmol/L (ref 3.5–5.1)
Sodium: 140 mmol/L (ref 135–145)
Total Bilirubin: 1 mg/dL (ref 0.3–1.2)
Total Protein: 6.5 g/dL (ref 6.5–8.1)

## 2018-10-16 LAB — LIPID PANEL
Cholesterol: 117 mg/dL (ref 0–200)
HDL: 35 mg/dL — ABNORMAL LOW (ref >40)
LDL Cholesterol: 68 mg/dL (ref 0–99)
Total CHOL/HDL Ratio: 3.3 RATIO
Triglycerides: 69 mg/dL (ref <150)
VLDL: 14 mg/dL (ref 0–40)

## 2018-10-16 LAB — CBC WITH DIFFERENTIAL/PLATELET
Abs Immature Granulocytes: 0.04 10*3/uL (ref 0.00–0.07)
Basophils Absolute: 0.1 10*3/uL (ref 0.0–0.1)
Basophils Relative: 1 %
Eosinophils Absolute: 0.3 10*3/uL (ref 0.0–0.5)
Eosinophils Relative: 5 %
HCT: 48.7 % (ref 39.0–52.0)
Hemoglobin: 15.2 g/dL (ref 13.0–17.0)
Immature Granulocytes: 1 %
Lymphocytes Relative: 12 %
Lymphs Abs: 0.7 10*3/uL (ref 0.7–4.0)
MCH: 29.6 pg (ref 26.0–34.0)
MCHC: 31.2 g/dL (ref 30.0–36.0)
MCV: 94.9 fL (ref 80.0–100.0)
Monocytes Absolute: 0.9 10*3/uL (ref 0.1–1.0)
Monocytes Relative: 16 %
Neutro Abs: 3.7 10*3/uL (ref 1.7–7.7)
Neutrophils Relative %: 65 %
Platelets: 215 10*3/uL (ref 150–400)
RBC: 5.13 MIL/uL (ref 4.22–5.81)
RDW: 15.3 % (ref 11.5–15.5)
WBC: 5.7 10*3/uL (ref 4.0–10.5)
nRBC: 0 % (ref 0.0–0.2)

## 2018-10-16 LAB — LACTATE DEHYDROGENASE: LDH: 184 U/L (ref 98–192)

## 2018-10-16 NOTE — Telephone Encounter (Signed)
Scheduled appt per 9/11 los.  Spoke with patient and he is aware of his appt date and time.

## 2018-10-19 ENCOUNTER — Other Ambulatory Visit: Payer: Self-pay | Admitting: Emergency Medicine

## 2018-10-27 ENCOUNTER — Encounter: Payer: Self-pay | Admitting: Emergency Medicine

## 2018-10-27 ENCOUNTER — Ambulatory Visit (INDEPENDENT_AMBULATORY_CARE_PROVIDER_SITE_OTHER): Payer: BC Managed Care – PPO | Admitting: Emergency Medicine

## 2018-10-27 ENCOUNTER — Other Ambulatory Visit: Payer: Self-pay

## 2018-10-27 VITALS — BP 110/80 | HR 60 | Ht 74.0 in | Wt 168.0 lb

## 2018-10-27 DIAGNOSIS — R9389 Abnormal findings on diagnostic imaging of other specified body structures: Secondary | ICD-10-CM | POA: Diagnosis not present

## 2018-10-27 DIAGNOSIS — Z23 Encounter for immunization: Secondary | ICD-10-CM

## 2018-10-27 DIAGNOSIS — J479 Bronchiectasis, uncomplicated: Secondary | ICD-10-CM

## 2018-10-27 NOTE — Patient Instructions (Signed)
We will plan to repeat your CT scan of the chest in March 2021. We will follow your mycobacterial cultures to completion.  They are all negative so far Please continue to use your flutter valve, albuterol every morning to assist with your mucus clearance. Continue Mucinex as you have been taking it. Continue Nasacort as you have been taking it Try starting loratadine 10 mg once daily He can continue to use Tessalon Perles each evening if needed to suppress cough. Flu shot today Follow with Dr Lamonte Sakai in 6 months or sooner if you have any problems

## 2018-10-27 NOTE — Progress Notes (Signed)
Subjective:    Patient ID: Jake Kennedy, male    DOB: January 09, 1951, 68 y.o.   MRN: [id number]  HPI 68 year old never smoker with a history of coronary disease, CLL.  Underwent chemotherapy with dexamethasone, Veneclax, but had to be stopped due to leukopenia. Then started obinutuzumab by Dr. Irene Limbo in June 2020. He has had a return of weakness, dyspnea, LAD, cough, wt loss. He is bringing up yellowish mucous, coughs every morning.   He is referred today for evaluation of an persistently abnormal CT chest.   CT chest on 7/7 was reviewed by me, shows a medial right lower lobe paraspinal opacity with an irregular border, diffuse bronchial wall thickening and some mild bronchiectatic change.  This is compared with a CT chest from 03/19/2018 and shows an interval improvement in base predominant peripheral peribronchovascular nodularity.  CT neck showed decreased cervical lymphadenopathy.  ROV 10/01/2018 --Jake Kennedy follows up today for his abnormal CT scan of the chest.  He has CLL followed by Dr. Irene Limbo, is currently on observation.  We performed bronchoscopy on 09/16/2018 to investigate a medial right lower lobe paraspinal opacity of unclear etiology.  All of his cytologies are negative, flow cytometry was not sent given the histo path.  Culture data is all negative so far, AFB not yet final.    We treated him for a sinusitis + bronchitis last week w levaquin - had yellow mucous. Symbicort was added empirically, started nasacort and mucinex. He is using albuterol bid. He is coughing less, has less sinus drainage. His next CT chest is next Friday. Still with some yellow sputum. He has a TTE scheduled for next week w Dr Burt Knack to eval for any possible cards component to his SOB.  ROV 10/27/2018 --this is a follow-up visit for patient with a history of CLL and an abnormal CT scan of the chest.  He underwent a reassuring bronchoscopy 09/16/2018, cytology and culture negative.  The final AFB cultures are still  pending, smears negative.  He had a repeat CT scan of the chest on 10/09/2018 that I reviewed, shows large-scale resolution of his medial right lower lobe nodular process compared with February.  He still has some areas of bronchiectatic change and subpleural nodular densities, all improved. He is in remission regarding his CLL, on observation. He is clearing secretions every morning. He is using a flutter, mucinex, albuterol bid - helps his cough, most helpful in the am. He is on Nasocort. Has tessalon to use prn at night.     Review of Systems  Constitutional: Positive for fatigue. Negative for fever and unexpected weight change.  HENT: Negative for congestion, dental problem, ear pain, nosebleeds, postnasal drip, rhinorrhea, sinus pressure, sneezing, sore throat and trouble swallowing.   Eyes: Negative for redness and itching.  Respiratory: Negative for cough, chest tightness, shortness of breath and wheezing.   Cardiovascular: Negative for palpitations and leg swelling.  Gastrointestinal: Negative for nausea and vomiting.  Genitourinary: Negative for dysuria.  Musculoskeletal: Negative for joint swelling.  Skin: Negative for rash.  Neurological: Negative for headaches.  Hematological: Does not bruise/bleed easily.  Psychiatric/Behavioral: Negative for dysphoric mood. The patient is not nervous/anxious.     Past Medical History:  Diagnosis Date  . Basal cell carcinoma    moles removed in 2007 and 2012 from L nose and L hip  . BPH (benign prostatic hypertrophy)   . CAD (coronary artery disease), native coronary artery    a. 02/10/2015 95% mid LAD stenosis s/p  DES  b. 07/15/16: LHC showed stable non obst dz and patent stent  . CLL (chronic lymphocytic leukemia) ([place])   . Coronary artery disease   . ED (erectile dysfunction)   . Lymphocytosis 02/11/2015   seen by Dr. Lindi Adie 02/11/2015, working up for possible CLL     Family History  Problem Relation Age of Onset  . Diabetes Mother   . Heart  failure Mother   . Heart attack Mother        occured in 58s.   . Healthy Sister   . Healthy Brother   . Healthy Maternal Grandmother   . Healthy Maternal Grandfather   . Healthy Paternal Grandmother   . Healthy Paternal Grandfather   . Healthy Brother      Social History   Socioeconomic History  . Marital status: Married    Spouse name: Not on file  . Number of children: Not on file  . Years of education: Not on file  . Highest education level: Not on file  Occupational History  . Not on file  Social Needs  . Financial resource strain: Not on file  . Food insecurity    Worry: Not on file    Inability: Not on file  . Transportation needs    Medical: Not on file    Non-medical: Not on file  Tobacco Use  . Smoking status: Never Smoker  . Smokeless tobacco: Never Used  Substance and Sexual Activity  . Alcohol use: Yes    Alcohol/week: 0.0 standard drinks    Comment: occasional  . Drug use: No  . Sexual activity: Not on file  Lifestyle  . Physical activity    Days per week: Not on file    Minutes per session: Not on file  . Stress: Not on file  Relationships  . Social Herbalist on phone: Not on file    Gets together: Not on file    Attends religious service: Not on file    Active member of club or organization: Not on file    Attends meetings of clubs or organizations: Not on file    Relationship status: Not on file  . Intimate partner violence    Fear of current or ex partner: Not on file    Emotionally abused: Not on file    Physically abused: Not on file    Forced sexual activity: Not on file  Other Topics Concern  . Not on file  Social History Narrative  . 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   Allergies  Allergen Reactions  . Adhesive [Tape]     Blisters & hives - Paper tape ok  . Latex Itching    Blisters and itching - pt states that it's not Latex but it's adhesives     Outpatient  Medications Prior to Visit  Medication Sig Dispense Refill  . acetaminophen (TYLENOL) 500 MG tablet Take 1,000 mg by mouth every 6 (six) hours as needed.    Marland Kitchen albuterol (VENTOLIN HFA) 108 (90 Base) MCG/ACT inhaler Inhale 2 puffs into the lungs every 6 (six) hours as needed for wheezing or shortness of breath. 6.7 g 0  . aspirin 81 MG tablet Take 81 mg by mouth daily.    Marland Kitchen atorvastatin (LIPITOR) 80 MG tablet Take 1 tablet (80 mg total) by mouth daily at 6 PM. 90 tablet 3  . benzonatate (TESSALON) 100 MG capsule TAKE 1 CAPSULE BY  MOUTH EVERY 6 HOURS AS NEEDED FOR COUGH 45 capsule 0  . Multiple Vitamin (MULTIVITAMIN) tablet Take 1 tablet by mouth daily.    . Multiple Vitamins-Minerals (PRESERVISION AREDS PO) Take 2 capsules by mouth daily.    . nitroGLYCERIN (NITROSTAT) 0.4 MG SL tablet Place 1 tablet (0.4 mg total) under the tongue every 5 (five) minutes as needed for chest pain. 25 tablet 3  . Omega-3 Fatty Acids (FISH OIL CONCENTRATE PO) Take 1 capsule by mouth daily.     Marland Kitchen Respiratory Therapy Supplies (FLUTTER) DEVI Use as directed 1 each 0  . triamcinolone (NASACORT ALLERGY 24HR) 55 MCG/ACT AERO nasal inhaler Place 2 sprays into the nose daily.    . vardenafil (LEVITRA) 10 MG tablet Take 10 mg by mouth as needed for erectile dysfunction.     . budesonide-formoterol (SYMBICORT) 80-4.5 MCG/ACT inhaler Inhale 2 puffs into the lungs 2 (two) times daily. 1 Inhaler 0  . ondansetron (ZOFRAN) 8 MG tablet Take 1 tablet (8 mg total) by mouth every 8 (eight) hours as needed for nausea. 30 tablet 3   No facility-administered medications prior to visit.         Objective:   Physical Exam Vitals:   10/27/18 0926  BP: 110/80  Pulse: 60  SpO2: 98%  Weight: 168 lb (76.2 kg)  Height: 6\' 2"  (1.88 m)   Gen: Pleasant, tall and thin, in no distress,  normal affect  ENT: No lesions,  mouth clear,  oropharynx clear, no postnasal drip  Neck: No JVD, no stridor  Lungs: No use of accessory muscles, no  crackles or wheezing on normal respiration, no wheeze on forced expiration  Cardiovascular: RRR, heart sounds normal, no murmur or gallops, no peripheral edema  Musculoskeletal: No deformities, no cyanosis or clubbing  Neuro: alert, awake, non focal  Skin: Warm, no lesions or rash      Assessment & Plan:  Abnormal CT of the chest With some bronchiectatic change and resolving pulmonary nodular disease.  Hopefully he will not flare since his CLL is in remission and he is not requiring any therapy.  We will follow his AFB to completion, negative so far.  Discussed treatment with him today if it were to become positive.  Good nasal hygiene, allergy regimen will be helpful.  Also flutter valve, albuterol for secretion clearance, Mucinex.  We will plan to repeat your CT scan of the chest in March 2021. We will follow your mycobacterial cultures to completion.  They are all negative so far Please continue to use your flutter valve, albuterol every morning to assist with your mucus clearance. Continue Mucinex as you have been taking it. Continue Nasacort as you have been taking it Try starting loratadine 10 mg once daily He can continue to use Tessalon Perles each evening if needed to suppress cough. Flu shot today Follow with Dr Lamonte Sakai in 6 months or sooner if you have any problems  [name], MD, PhD 10/27/2018, 9:57 AM [facility] [phone] or if no answer [phone]

## 2018-10-27 NOTE — Assessment & Plan Note (Signed)
With some bronchiectatic change and resolving pulmonary nodular disease.  Hopefully he will not flare since his CLL is in remission and he is not requiring any therapy.  We will follow his AFB to completion, negative so far.  Discussed treatment with him today if it were to become positive.  Good nasal hygiene, allergy regimen will be helpful.  Also flutter valve, albuterol for secretion clearance, Mucinex.  We will plan to repeat your CT scan of the chest in March 2021. We will follow your mycobacterial cultures to completion.  They are all negative so far Please continue to use your flutter valve, albuterol every morning to assist with your mucus clearance. Continue Mucinex as you have been taking it. Continue Nasacort as you have been taking it Try starting loratadine 10 mg once daily He can continue to use Tessalon Perles each evening if needed to suppress cough. Flu shot today Follow with Dr Lamonte Sakai in 6 months or sooner if you have any problems

## 2018-10-29 LAB — ACID FAST CULTURE WITH REFLEXED SENSITIVITIES (MYCOBACTERIA): Acid Fast Culture: NEGATIVE

## 2018-10-30 LAB — ACID FAST CULTURE WITH REFLEXED SENSITIVITIES (MYCOBACTERIA): Acid Fast Culture: NEGATIVE

## 2018-11-13 DIAGNOSIS — L820 Inflamed seborrheic keratosis: Secondary | ICD-10-CM | POA: Diagnosis not present

## 2018-11-13 DIAGNOSIS — D0439 Carcinoma in situ of skin of other parts of face: Secondary | ICD-10-CM | POA: Diagnosis not present

## 2018-11-13 DIAGNOSIS — L821 Other seborrheic keratosis: Secondary | ICD-10-CM | POA: Diagnosis not present

## 2018-11-13 DIAGNOSIS — L570 Actinic keratosis: Secondary | ICD-10-CM | POA: Diagnosis not present

## 2018-11-13 DIAGNOSIS — Z85828 Personal history of other malignant neoplasm of skin: Secondary | ICD-10-CM | POA: Diagnosis not present

## 2018-11-13 DIAGNOSIS — D225 Melanocytic nevi of trunk: Secondary | ICD-10-CM | POA: Diagnosis not present

## 2018-12-09 DIAGNOSIS — H5213 Myopia, bilateral: Secondary | ICD-10-CM | POA: Diagnosis not present

## 2018-12-09 DIAGNOSIS — H52223 Regular astigmatism, bilateral: Secondary | ICD-10-CM | POA: Diagnosis not present

## 2018-12-09 DIAGNOSIS — H524 Presbyopia: Secondary | ICD-10-CM | POA: Diagnosis not present

## 2018-12-28 ENCOUNTER — Encounter: Payer: Self-pay | Admitting: Hematology

## 2018-12-29 ENCOUNTER — Telehealth: Payer: Self-pay | Admitting: *Deleted

## 2018-12-29 NOTE — Telephone Encounter (Signed)
Contacted patient in response to [employer] - This office has not been notified of any changes in scheduled ultrasounds (appt on 11/30). If there is, he will be notified by  Radiology.   At this time, all [facility] appointments are being considered for change to virtual. As he also has labs ordered on same day, his appt with Dr. Irene Limbo may or not change. If any changes are made, he will be contacted. He verbalized understanding.

## 2019-01-04 ENCOUNTER — Other Ambulatory Visit: Payer: Self-pay

## 2019-01-04 ENCOUNTER — Ambulatory Visit (HOSPITAL_COMMUNITY)
Admission: RE | Admit: 2019-01-04 | Discharge: 2019-01-04 | Disposition: A | Discharge status: Home / Self Care | Payer: BC Managed Care – PPO | Source: Ambulatory Visit | Attending: Hematology | Admitting: Hematology

## 2019-01-04 DIAGNOSIS — C9110 Chronic lymphocytic leukemia of B-cell type not having achieved remission: Secondary | ICD-10-CM | POA: Diagnosis not present

## 2019-01-04 DIAGNOSIS — E041 Nontoxic single thyroid nodule: Secondary | ICD-10-CM | POA: Diagnosis not present

## 2019-01-15 ENCOUNTER — Telehealth: Payer: Self-pay | Admitting: Hematology

## 2019-01-15 ENCOUNTER — Inpatient Hospital Stay: Payer: BC Managed Care – PPO | Attending: Hematology and Oncology

## 2019-01-15 ENCOUNTER — Inpatient Hospital Stay (HOSPITAL_BASED_OUTPATIENT_CLINIC_OR_DEPARTMENT_OTHER): Payer: BC Managed Care – PPO | Admitting: Hematology

## 2019-01-15 ENCOUNTER — Encounter: Payer: Self-pay | Admitting: Hematology

## 2019-01-15 ENCOUNTER — Other Ambulatory Visit: Payer: Self-pay

## 2019-01-15 VITALS — BP 127/77 | HR 67 | Temp 97.8°F | Resp 18 | Ht 74.0 in | Wt 187.8 lb

## 2019-01-15 DIAGNOSIS — Z9189 Other specified personal risk factors, not elsewhere classified: Secondary | ICD-10-CM

## 2019-01-15 DIAGNOSIS — R911 Solitary pulmonary nodule: Secondary | ICD-10-CM | POA: Diagnosis not present

## 2019-01-15 DIAGNOSIS — E041 Nontoxic single thyroid nodule: Secondary | ICD-10-CM | POA: Insufficient documentation

## 2019-01-15 DIAGNOSIS — C9110 Chronic lymphocytic leukemia of B-cell type not having achieved remission: Secondary | ICD-10-CM

## 2019-01-15 DIAGNOSIS — D702 Other drug-induced agranulocytosis: Secondary | ICD-10-CM

## 2019-01-15 LAB — CMP (CANCER CENTER ONLY)
ALT: 32 U/L (ref 0–44)
AST: 19 U/L (ref 15–41)
Albumin: 4 g/dL (ref 3.5–5.0)
Alkaline Phosphatase: 95 U/L (ref 38–126)
Anion gap: 6 (ref 5–15)
BUN: 23 mg/dL (ref 8–23)
CO2: 32 mmol/L (ref 22–32)
Calcium: 8.8 mg/dL — ABNORMAL LOW (ref 8.9–10.3)
Chloride: 103 mmol/L (ref 98–111)
Creatinine: 0.92 mg/dL (ref 0.61–1.24)
GFR, Est AFR Am: >60 mL/min (ref >60)
GFR, Estimated: >60 mL/min (ref >60)
Glucose, Bld: 81 mg/dL (ref 70–99)
Potassium: 4.3 mmol/L (ref 3.5–5.1)
Sodium: 141 mmol/L (ref 135–145)
Total Bilirubin: 0.7 mg/dL (ref 0.3–1.2)
Total Protein: 6.2 g/dL — ABNORMAL LOW (ref 6.5–8.1)

## 2019-01-15 LAB — CBC WITH DIFFERENTIAL/PLATELET
Abs Immature Granulocytes: 0.02 10*3/uL (ref 0.00–0.07)
Basophils Absolute: 0 10*3/uL (ref 0.0–0.1)
Basophils Relative: 1 %
Eosinophils Absolute: 0.1 10*3/uL (ref 0.0–0.5)
Eosinophils Relative: 4 %
HCT: 50.6 % (ref 39.0–52.0)
Hemoglobin: 16.3 g/dL (ref 13.0–17.0)
Immature Granulocytes: 1 %
Lymphocytes Relative: 17 %
Lymphs Abs: 0.6 10*3/uL — ABNORMAL LOW (ref 0.7–4.0)
MCH: 29.9 pg (ref 26.0–34.0)
MCHC: 32.2 g/dL (ref 30.0–36.0)
MCV: 92.7 fL (ref 80.0–100.0)
Monocytes Absolute: 0.7 10*3/uL (ref 0.1–1.0)
Monocytes Relative: 19 %
Neutro Abs: 2.2 10*3/uL (ref 1.7–7.7)
Neutrophils Relative %: 58 %
Platelets: 178 10*3/uL (ref 150–400)
RBC: 5.46 MIL/uL (ref 4.22–5.81)
RDW: 13.3 % (ref 11.5–15.5)
WBC: 3.7 10*3/uL — ABNORMAL LOW (ref 4.0–10.5)
nRBC: 0 % (ref 0.0–0.2)

## 2019-01-15 LAB — URIC ACID: Uric Acid, Serum: 4.8 mg/dL (ref 3.7–8.6)

## 2019-01-15 LAB — LACTATE DEHYDROGENASE: LDH: 193 U/L — ABNORMAL HIGH (ref 98–192)

## 2019-01-15 LAB — TSH: TSH: 2.227 u[IU]/mL (ref 0.320–4.118)

## 2019-01-15 LAB — T4, FREE: Free T4: 0.72 ng/dL (ref 0.61–1.12)

## 2019-01-15 NOTE — Progress Notes (Signed)
HEMATOLOGY/ONCOLOGY CLINIC NOTE  Date of Service: 01/15/2019  Patient Care Team: [name], MD as PCP - General (Internal Medicine) Sherren Mocha, MD as PCP - Cardiology (Cardiology)  CHIEF COMPLAINTS/PURPOSE OF CONSULTATION:  Chronic Lymphocytic Leukemia  Oncologic History:   Oncology History  Chronic lymphocytic leukemia (CLL), B-cell ([place])  02/14/2015 Initial Diagnosis   Monoclonal B-cell population CD5 and CD20 positive, differential diagnosis CLL versus mantle cell lymphoma ( patient had absolute lymphocyte count of 8000 in 2010), remained stable.   07/09/2018 -  Chemotherapy   The patient had obinutuzumab (GAZYVA) 100 mg in sodium chloride 0.9 % 100 mL (0.9615 mg/mL) chemo infusion, 100 mg, Intravenous, Once, 2 of 6 cycles Administration: 100 mg (07/09/2018), 900 mg (07/10/2018), 1,000 mg (07/31/2018)  for chemotherapy treatment.       HISTORY OF PRESENTING ILLNESS:   Jake Kennedy is a wonderful 68 y.o. male who has been referred to [place] by Dr. [name] for evaluation and management of Chronic Lymphocytic Leukemia. The pt was formerly under the care of my colleague Dr. Nicholas Lose. He is accompanied today by his wife. The pt reports that he is doing well overall.  The pt reports that his course of CLL had been pretty consistent since diagnosis in 2017, until roughly 3 months ago in roughly November 2019. He notes that he has had recent increase in fatigue in the last 3 months, and more acutely in the last 3 weeks. He endorses occasional fevers over the last two weeks during the night, highest at 100.2 or 100.3. He also endorses some night sweats, not every night, some of these being "drenching." He notes that he has been eating well for the last 6 months, however has lost 8 pounds in that interim. He endorses regular exercising until the past month, due to his worsened fatigue, and had previously regularly run upwards of 3 miles and bike for several miles as well.  The  pt notes that he can't smell very well, in the last 2-3 weeks, and his hearing has also reduced in the last week. He senses that his enlarged lymph nodes are pressing on his neck, and characterizes this as similar to when one has an acute cold and congestion. He had acute pain in his right hip a week ago and saw my colleague Sandi Mealy, PA in Symptom Managment. He was given Norco but denies taking this because he didn't want to. He endorses a positional element to his hip pain, which e attributes to the pain being from an enlarged lymph node. He notes that his hip pain had sudden onset on the night of 03/18/18. He notes that his neck lymph nodes have grown in the last 3 weeks to a month. The pt notes that his lymph nodes in his neck, armpits, and groin have been enlarged.  The pt tried an antibiotic course 2 weeks ago with his PCP for a suspected sinus infection, during which his cervical lymph nodes enlarged further. He notes that his neck lymph nodes are somewhat improved today as compared to this. The pt denies frequent infections.   Of note prior to the patient's visit today, pt has had CT N/C/A/P completed on 03/19/18 with results revealing NECK: Multiple enlarged bilateral cervical lymph nodes consistent with the given diagnosis of CLL. The largest node is a right level 3 lymph node measuring 3.4 x 2.8 x 1.2 cm. 2. Degenerative changes of the cervical spine. 3. Hypodense lesion in the right side of the  tongue measuring up to 1.5 cm. This could represent focal infection. Neoplasm is considered less likely. The lesion appears to be superficial along the right lateral aspect of the anterior tongue a. This should be amenable to direct inspection, C/A/P: Marked adenopathy within the chest, abdomen, and pelvis, consistent with active lymphoma/leukemia. 2. Splenomegaly is nonspecific. Splenic involvement cannot be excluded. 3. Right greater than left and basilar predominant peribronchovascular interstitial  thickening and nodularity. Considerations include pulmonary involvement of lymphoma and/or Infection/aspiration. 4. Coronary artery atherosclerosis. Aortic Atherosclerosis. 5. Trace nonspecific pelvic fluid.  Most recent lab results (03/17/18) of CBC w/diff and CMP is as follows: all values are WNL except for WBC at 13.1k, RBC at 2.67, HGB at 10.1, HCT at 31.0, MCV at 116.1, MCH at 37.8, PLT at 103k, ANC at 900, Lymphs abs at 11.1k, BUN at 31, Calcium at 8.7, Total Protein at 6.2, Albumin at 3.4. 03/17/18 LDH at 241  On review of systems, pt reports new fatigue, recent fevers, recent night sweats, recently grown neck lymph nodes, eating well, some weight loss, right groin pain, and denies chills, SOB, ear pain, pain along the spine, frequent infections, flank pain, leg swelling, testicular pain or swelling, and any other symptoms.   Interval History:   Jake Kennedy returns today for management and evaluation of his CLL. The patient's last visit with [place] was on 10/16/2018. The pt reports that he is doing well overall.  The pt reports he is doing well. He has good energy.   -His sinus drainage has improved -He is staying active.  CT chest will be in March 2020  Of note since the patient's last visit, pt has had US THYROID (Accession [id number]) completed on 10/09/2018 with results revealing "1. Solitary 1.2 cm benign colloid containing cyst within the left lobe of the thyroid does not meet imaging criteria to recommend percutaneous sampling or continued dedicated follow-up. 2. Otherwise, normal thyroid ultrasound. The above is in keeping with the ACR TI-RADS recommendations - J Am Coll Radiol 2017;14:587-595."  Lab results today (01/15/19) of CBC w/diff and CMP is as follows: all values are WNL except for WBC at 3.7, Lymphs Abs at 0.6, Calcium at 8.8, Total Protein at 6.2, LDH at 193 PENDING T4 and TSH.  On review of systems, pt reports halitosis and denies back pain, abdominal pain, leg  swelling and any other symptoms.   MEDICAL HISTORY:  Past Medical History:  Diagnosis Date  . Basal cell carcinoma    moles removed in 2007 and 2012 from L nose and L hip  . BPH (benign prostatic hypertrophy)   . CAD (coronary artery disease), native coronary artery    a. 02/10/2015 95% mid LAD stenosis s/p DES  b. 07/15/16: LHC showed stable non obst dz and patent stent  . CLL (chronic lymphocytic leukemia) ([place])   . Coronary artery disease   . ED (erectile dysfunction)   . Lymphocytosis 02/11/2015   seen by Dr. Lindi Adie 02/11/2015, working up for possible CLL    SURGICAL HISTORY: Past Surgical History:  Procedure Laterality Date  . CARDIAC CATHETERIZATION  02/10/2015  . CARDIAC CATHETERIZATION N/A 02/10/2015   Procedure: Left Heart Cath and Coronary Angiography;  Surgeon: Sherren Mocha, MD;  Location: [facility];  Service: Cardiovascular;  Laterality: N/A;  . CARDIAC CATHETERIZATION N/A 02/10/2015   Procedure: Coronary Stent Intervention;  Surgeon: Sherren Mocha, MD;  Location: [facility];  Service: Cardiovascular;  Laterality: N/A;  . CORONARY STENT PLACEMENT  02/10/2015  DES to LAD  . LAMINECTOMY    . LEFT HEART CATH AND CORONARY ANGIOGRAPHY N/A 07/15/2016   Procedure: Left Heart Cath and Coronary Angiography;  Surgeon: Martinique, Peter M, MD;  Location: [facility];  Service: Cardiovascular;  Laterality: N/A;  . TONSILLECTOMY    . VIDEO BRONCHOSCOPY WITH ENDOBRONCHIAL NAVIGATION N/A 09/16/2018   Procedure: VIDEO BRONCHOSCOPY WITH ENDOBRONCHIAL NAVIGATION AND BIOPIES;  Surgeon: Collene Gobble, MD;  Location: [facility];  Service: Thoracic;  Laterality: N/A;    SOCIAL HISTORY: Social History   Socioeconomic History  . Marital status: Married    Spouse name: Not on file  . Number of children: Not on file  . Years of education: Not on file  . Highest education level: Not on file  Occupational History  . Not on file  Tobacco Use  . Smoking status: Never Smoker  .  Smokeless tobacco: Never Used  Substance and Sexual Activity  . Alcohol use: Yes    Alcohol/week: 0.0 standard drinks    Comment: occasional  . Drug use: No  . Sexual activity: Not on file  Other Topics Concern  . Not on file  Social History Narrative  . Not on file   Social Determinants of Health   Financial Resource Strain:   . Difficulty of Paying Living Expenses: Not on file  Food Insecurity:   . Worried About Charity fundraiser in the Last Year: Not on file  . Ran Out of Food in the Last Year: Not on file  Transportation Needs:   . Lack of Transportation (Medical): Not on file  . Lack of Transportation (Non-Medical): Not on file  Physical Activity:   . Days of Exercise per Week: Not on file  . Minutes of Exercise per Session: Not on file  Stress:   . Feeling of Stress : Not on file  Social Connections:   . Frequency of Communication with Friends and Family: Not on file  . Frequency of Social Gatherings with Friends and Family: Not on file  . Attends Religious Services: Not on file  . Active Member of Clubs or Organizations: Not on file  . Attends Archivist Meetings: Not on file  . Marital Status: Not on file  Intimate Partner Violence:   . Fear of Current or Ex-Partner: Not on file  . Emotionally Abused: Not on file  . Physically Abused: Not on file  . Sexually Abused: Not on file    FAMILY HISTORY: Family History  Problem Relation Age of Onset  . Diabetes Mother   . Heart failure Mother   . Heart attack Mother        occured in 32s.   . Healthy Sister   . Healthy Brother   . Healthy Maternal Grandmother   . Healthy Maternal Grandfather   . Healthy Paternal Grandmother   . Healthy Paternal Grandfather   . Healthy Brother     ALLERGIES:  is allergic to adhesive [tape] and latex.  MEDICATIONS:  Current Outpatient Medications  Medication Sig Dispense Refill  . acetaminophen (TYLENOL) 500 MG tablet Take 1,000 mg by mouth every 6 (six) hours  as needed.    Marland Kitchen albuterol (VENTOLIN HFA) 108 (90 Base) MCG/ACT inhaler Inhale 2 puffs into the lungs every 6 (six) hours as needed for wheezing or shortness of breath. 6.7 g 0  . aspirin 81 MG tablet Take 81 mg by mouth daily.    Marland Kitchen atorvastatin (LIPITOR) 80 MG tablet Take 1 tablet (  80 mg total) by mouth daily at 6 PM. 90 tablet 3  . benzonatate (TESSALON) 100 MG capsule TAKE 1 CAPSULE BY MOUTH EVERY 6 HOURS AS NEEDED FOR COUGH 45 capsule 0  . Multiple Vitamin (MULTIVITAMIN) tablet Take 1 tablet by mouth daily.    . Multiple Vitamins-Minerals (PRESERVISION AREDS PO) Take 2 capsules by mouth daily.    . nitroGLYCERIN (NITROSTAT) 0.4 MG SL tablet Place 1 tablet (0.4 mg total) under the tongue every 5 (five) minutes as needed for chest pain. 25 tablet 3  . Omega-3 Fatty Acids (FISH OIL CONCENTRATE PO) Take 1 capsule by mouth daily.     Marland Kitchen Respiratory Therapy Supplies (FLUTTER) DEVI Use as directed 1 each 0  . triamcinolone (NASACORT ALLERGY 24HR) 55 MCG/ACT AERO nasal inhaler Place 2 sprays into the nose daily.    . vardenafil (LEVITRA) 10 MG tablet Take 10 mg by mouth as needed for erectile dysfunction.      No current facility-administered medications for this visit.    REVIEW OF SYSTEMS:   A 10+ POINT REVIEW OF SYSTEMS WAS OBTAINED including neurology, dermatology, psychiatry, cardiac, respiratory, lymph, extremities, GI, GU, Musculoskeletal, constitutional, breasts, reproductive, HEENT.  All pertinent positives are noted in the HPI.  All others are negative.    PHYSICAL EXAMINATION: ECOG FS:1 - Symptomatic but completely ambulatory  Vitals:   01/15/19 1052  BP: 127/77  Pulse: 67  Resp: 18  Temp: 97.8 F (36.6 C)  SpO2: 100%   Wt Readings from Last 3 Encounters:  01/15/19 187 lb 12.8 oz (85.2 kg)  10/27/18 168 lb (76.2 kg)  10/16/18 162 lb 8 oz (73.7 kg)   Body mass index is 24.11 kg/m.    GENERAL:alert, in no acute distress and comfortable SKIN: no acute rashes, no significant  lesions EYES: conjunctiva are pink and non-injected, sclera anicteric OROPHARYNX: MMM, no exudates, no oropharyngeal erythema or ulceration NECK: supple, no JVD LYMPH:  no palpable lymphadenopathy in the cervical, axillary or inguinal regions LUNGS: clear to auscultation b/l with normal respiratory effort HEART: regular rate & rhythm ABDOMEN:  normoactive bowel sounds , non tender, not distended. Extremity: no pedal edema PSYCH: alert & oriented x 3 with fluent speech NEURO: no focal motor/sensory deficits   LABORATORY DATA:  I have reviewed the data as listed  . CBC Latest Ref Rng & Units 01/15/2019 10/16/2018 09/10/2018  WBC 4.0 - 10.5 K/uL 3.7(L) 5.7 9.6  Hemoglobin 13.0 - 17.0 g/dL 16.3 15.2 14.2  Hematocrit 39.0 - 52.0 % 50.6 48.7 45.8  Platelets 150 - 400 K/uL 178 215 223  ANC 700<---600<---1200  . CMP Latest Ref Rng & Units 01/15/2019 10/16/2018 09/10/2018  Glucose 70 - 99 mg/dL 81 95 110(H)  BUN 8 - 23 mg/dL 23 17 19   Creatinine 0.61 - 1.24 mg/dL 0.92 0.80 0.74  Sodium 135 - 145 mmol/L 141 140 136  Potassium 3.5 - 5.1 mmol/L 4.3 4.6 4.6  Chloride 98 - 111 mmol/L 103 102 98  CO2 22 - 32 mmol/L 32 32 28  Calcium 8.9 - 10.3 mg/dL 8.8(L) 9.2 9.1  Total Protein 6.5 - 8.1 g/dL 6.2(L) 6.5 5.9(L)  Total Bilirubin 0.3 - 1.2 mg/dL 0.7 1.0 0.7  Alkaline Phos 38 - 126 U/L 95 104 79  AST 15 - 41 U/L 19 19 13(L)  ALT 0 - 44 U/L 32 34 22  01/04/2019 [place] TYROID (Accession [id number])    04/30/18 Outside Labs:  RADIOGRAPHIC STUDIES: I have personally reviewed the radiological images as listed and agreed with the findings in the report. US THYROID  Result Date: 01/04/2019 CLINICAL DATA:  Incidental on CT. Questioned thyroid nodule on chest CT performed 10/09/2018. History of lymphoma. EXAM: THYROID ULTRASOUND TECHNIQUE: Ultrasound examination of the thyroid gland and adjacent soft tissues was performed. COMPARISON:  Chest CT-10/09/2018 FINDINGS: Parenchymal  Echotexture: Normal Isthmus: Normal in size measuring 0.3 cm in diameter Right lobe: Normal in size measuring 4.5 x 1.4 x 1.6 cm Left lobe: Normal in size measuring 4.8 x 1.0 x 1.4 cm _________________________________________________________ Estimated total number of nodules >/= 1 cm: 1 Number of spongiform nodules >/=  2 cm not described below (TR1): 0 Number of mixed cystic and solid nodules >/= 1.5 cm not described below (TR2): 0 _________________________________________________________ There is an approximately 1.2 x 0.9 x 0.8 cm cyst within the inferior pole of the left lobe of the thyroid contains an internal echogenic foci ring down artifact compatible with benign colloid. This benign colloid containing cyst does not meet imaging criteria to recommend percutaneous sampling or continued dedicated follow-up. IMPRESSION: 1. Solitary 1.2 cm benign colloid containing cyst within the left lobe of the thyroid does not meet imaging criteria to recommend percutaneous sampling or continued dedicated follow-up. 2. Otherwise, normal thyroid ultrasound. The above is in keeping with the ACR TI-RADS recommendations - J Am Coll Radiol 2017;14:587-595. Electronically Signed   By: Sandi Mariscal M.D.   On: 01/04/2019 08:23    ASSESSMENT & PLAN:  68 y.o. male with  1. Chronic Lymphocytic Leukemia Initial diagnosis on 02/13/15 Peripheral blood flow cytometry which revealed a Monoclonal B-cell population 09/25/15 Cytogenetics ruled out Mantle cell lymphoma. 12/25/17 FISH study ruled out translocation 11;14 12/30/17 BM Biopsy revealed CLL without evidence of transformation  03/17/18 FISH CLL Prognostic panel revealed an 11q deletion and Trisomy 12  03/19/18 CT N/C/A/P revealed NECK: Multiple enlarged bilateral cervical lymph nodes consistent with the given diagnosis of CLL. The largest node is a right level 3 lymph node measuring 3.4 x 2.8 x 1.2 cm. 2. Degenerative changes of the cervical spine. 3. Hypodense lesion in the  right side of the tongue measuring up to 1.5 cm. This could represent focal infection. Neoplasm is considered less likely. The lesion appears to be superficial along the right lateral aspect of the anterior tongue a. This should be amenable to direct inspection, C/A/P: Marked adenopathy within the chest, abdomen, and pelvis, consistent with active lymphoma/leukemia. 2. Splenomegaly is nonspecific. Splenic involvement cannot be excluded. 3. Right greater than left and basilar predominant peribronchovascular interstitial thickening and nodularity. Considerations include pulmonary involvement of lymphoma and/or Infection/aspiration. 4. Coronary artery atherosclerosis. Aortic Atherosclerosis. 5. Trace nonspecific pelvic fluid.  03/26/18 Left cervical LN biopsy revealed SLL/CLL  08/11/18 CT Neck revealed "Decreased cervical lymphadenopathy with all remaining nodes being subcentimeter in short axis. 2. Sinusitis."  08/11/18 CT Chest revealed "Interval improvement without resolution of the basilar predominant peripheral peribronchovascular nodularity. 2. Interval development of a new masslike consolidative opacity in the medial right lung measuring up to 3.3 cm. This may be infectious/inflammatory. Lymphoma involvement not excluded. 3. Interval resolution of splenomegaly with small volume perisplenic ascites identified today. 4.  Aortic Atherosclerosis."  #2 Lung Lesion -Pt is scheduled for a bronchoscopy on 09/16/2018 -Pt reports that he has not been out of the [place] much -No known contact with tuberculosis  #3 Thyroid nodule- appears benign on Ultrasound.   PLAN: -Discussed pt labwork today, 01/15/19; all values  are WNL except for WBC at 3.7, Lymphs Abs at 0.6, Calcium at 8.8, Total Protein at 6.2, PENDING T4 and TSH. -Discussed US THYROID (Accession [id number]) completed on 10/09/2018 with results revealing "1. Solitary 1.2 cm benign colloid containing cyst within the left lobe of the thyroid does not meet  imaging criteria to recommend percutaneous sampling or continued dedicated follow-up. 2. Otherwise, normal thyroid ultrasound." -Discussed 01/15/19 WBC at 3.7 -Discussed 01/15/19  Hemoglobin at 16.3, impoved -Discussed 01/15/19 platelets at 178, stable -Discussed CLL is not progressing according to blood counts and chemistries. Advised venetoclax does not need to be started a this time. -Will follow up after visit with Dr.Byrum (around April) -Recommended steam inhaler for allergies and congestion. -Recommended pneumonia vaccines, he received both vaccines. One in 2018 and the other in 2019.   FOLLOW UP: RTC with Dr Irene Limbo with labs in 4 months  The total time spent in the appt was 25 minutes and more than 50% was on counseling and direct patient cares.  All of the patient's questions were answered with apparent satisfaction. The patient knows to call the clinic with any problems, questions or concerns.     Sullivan Lone MD MS AAHIVMS [facility] Hematology/Oncology Physician [facility]  (Office):       [phone] (Work cell):  [phone] (Fax):           [phone]  01/15/2019 6:32 AM  I, Scot Dock, am acting as a scribe for Dr. Sullivan Lone.   .I have reviewed the above documentation for accuracy and completeness, and I agree with the above. Brunetta Genera MD

## 2019-01-15 NOTE — Telephone Encounter (Signed)
Scheduled per 12/11 los, called and informed patient.

## 2019-02-02 ENCOUNTER — Ambulatory Visit: Payer: BC Managed Care – PPO | Admitting: Cardiovascular Disease

## 2019-02-02 ENCOUNTER — Encounter

## 2019-02-04 ENCOUNTER — Ambulatory Visit (INDEPENDENT_AMBULATORY_CARE_PROVIDER_SITE_OTHER): Payer: BC Managed Care – PPO | Admitting: Otolaryngology

## 2019-02-09 ENCOUNTER — Encounter: Payer: Self-pay | Admitting: Hematology

## 2019-02-10 ENCOUNTER — Ambulatory Visit (INDEPENDENT_AMBULATORY_CARE_PROVIDER_SITE_OTHER): Payer: BC Managed Care – PPO | Admitting: Otolaryngology

## 2019-02-10 ENCOUNTER — Encounter (INDEPENDENT_AMBULATORY_CARE_PROVIDER_SITE_OTHER): Payer: Self-pay | Admitting: Otolaryngology

## 2019-02-10 ENCOUNTER — Other Ambulatory Visit: Payer: Self-pay

## 2019-02-10 VITALS — Temp 97.5°F

## 2019-02-10 DIAGNOSIS — D3702 Neoplasm of uncertain behavior of tongue: Secondary | ICD-10-CM | POA: Diagnosis not present

## 2019-02-10 NOTE — Progress Notes (Signed)
HPI: Jake Kennedy is a 69 y.o. male who returns today for evaluation of a new tongue nodule that he has noticed over the past month.  He denies any pain associated with this.  He first noticed this when he started brushing his tongue with toothbrush because of his wife complaining that he has halitosis.  He has not noticed any significant change in size over the past several weeks. He has had some chronic respiratory problems recently they are gradually getting better. He has history of CLL that is presently in remission. He has had history of cardiac stent being placed is doing well with this he exercises regularly.  Past Medical History:  Diagnosis Date  . Basal cell carcinoma    moles removed in 2007 and 2012 from L nose and L hip  . BPH (benign prostatic hypertrophy)   . CAD (coronary artery disease), native coronary artery    a. 02/10/2015 95% mid LAD stenosis s/p DES  b. 07/15/16: LHC showed stable non obst dz and patent stent  . CLL (chronic lymphocytic leukemia) ([place])   . Coronary artery disease   . ED (erectile dysfunction)   . Lymphocytosis 02/11/2015   seen by Dr. Lindi Adie 02/11/2015, working up for possible CLL   Past Surgical History:  Procedure Laterality Date  . CARDIAC CATHETERIZATION  02/10/2015  . CARDIAC CATHETERIZATION N/A 02/10/2015   Procedure: Left Heart Cath and Coronary Angiography;  Surgeon: Sherren Mocha, MD;  Location: [place] [facility];  Service: Cardiovascular;  Laterality: N/A;  . CARDIAC CATHETERIZATION N/A 02/10/2015   Procedure: Coronary Stent Intervention;  Surgeon: Sherren Mocha, MD;  Location: [place] [facility];  Service: Cardiovascular;  Laterality: N/A;  . CORONARY STENT PLACEMENT  02/10/2015   DES to LAD  . LAMINECTOMY    . LEFT HEART CATH AND CORONARY ANGIOGRAPHY N/A 07/15/2016   Procedure: Left Heart Cath and Coronary Angiography;  Surgeon: Martinique, Peter M, MD;  Location: [place] [facility];  Service: Cardiovascular;  Laterality: N/A;  . TONSILLECTOMY     . VIDEO BRONCHOSCOPY WITH ENDOBRONCHIAL NAVIGATION N/A 09/16/2018   Procedure: VIDEO BRONCHOSCOPY WITH ENDOBRONCHIAL NAVIGATION AND BIOPIES;  Surgeon: Collene Gobble, MD;  Location: [facility];  Service: Thoracic;  Laterality: N/A;   Social History   Socioeconomic History  . Marital status: Married    Spouse name: Not on file  . Number of children: Not on file  . Years of education: Not on file  . Highest education level: Not on file  Occupational History  . Not on file  Tobacco Use  . Smoking status: Never Smoker  . Smokeless tobacco: Never Used  Substance and Sexual Activity  . Alcohol use: Yes    Alcohol/week: 0.0 standard drinks    Comment: occasional  . Drug use: No  . Sexual activity: Not on file  Other Topics Concern  . Not on file  Social History Narrative  . Not on file   Social Determinants of Health   Financial Resource Strain:   . Difficulty of Paying Living Expenses: Not on file  Food Insecurity:   . Worried About Charity fundraiser in the Last Year: Not on file  . Ran Out of Food in the Last Year: Not on file  Transportation Needs:   . Lack of Transportation (Medical): Not on file  . Lack of Transportation (Non-Medical): Not on file  Physical Activity:   . Days of Exercise per Week: Not on file  . Minutes of Exercise  per Session: Not on file  Stress:   . Feeling of Stress : Not on file  Social Connections:   . Frequency of Communication with Friends and Family: Not on file  . Frequency of Social Gatherings with Friends and Family: Not on file  . Attends Religious Services: Not on file  . Active Member of Clubs or Organizations: Not on file  . Attends Archivist Meetings: Not on file  . Marital Status: Not on file   Family History  Problem Relation Age of Onset  . Diabetes Mother   . Heart failure Mother   . Heart attack Mother        occured in 41s.   . Healthy Sister   . Healthy Brother   . Healthy Maternal Grandmother   . Healthy  Maternal Grandfather   . Healthy Paternal Grandmother   . Healthy Paternal Grandfather   . Healthy Brother    Allergies  Allergen Reactions  . Adhesive [Tape]     Blisters & hives - Paper tape ok  . Latex Itching    Blisters and itching - pt states that it's not Latex but it's adhesives   Prior to Admission medications   Medication Sig Start Date End Date Taking? Authorizing Provider  acetaminophen (TYLENOL) 500 MG tablet Take 1,000 mg by mouth every 6 (six) hours as needed.   Yes [provider]  aspirin 81 MG tablet Take 81 mg by mouth daily.   Yes [provider]  atorvastatin (LIPITOR) 80 MG tablet Take 1 tablet (80 mg total) by mouth daily at 6 PM. 04/09/18 04/04/19 Yes Sherren Mocha, MD  Multiple Vitamin (MULTIVITAMIN) tablet Take 1 tablet by mouth daily.   Yes [provider]  Multiple Vitamins-Minerals (PRESERVISION AREDS PO) Take 2 capsules by mouth daily.   Yes [provider]  nitroGLYCERIN (NITROSTAT) 0.4 MG SL tablet Place 1 tablet (0.4 mg total) under the tongue every 5 (five) minutes as needed for chest pain. 04/09/18  Yes Sherren Mocha, MD  Omega-3 Fatty Acids (FISH OIL CONCENTRATE PO) Take 1 capsule by mouth daily.    Yes [provider]  Respiratory Therapy Supplies (FLUTTER) DEVI Use as directed 09/18/18  Yes Martyn Ehrich, NP  triamcinolone (NASACORT ALLERGY 24HR) 55 MCG/ACT AERO nasal inhaler Place 2 sprays into the nose daily.   Yes [provider]  vardenafil (LEVITRA) 10 MG tablet Take 10 mg by mouth as needed for erectile dysfunction.    Yes [provider]  albuterol (VENTOLIN HFA) 108 (90 Base) MCG/ACT inhaler Inhale 2 puffs into the lungs every 6 (six) hours as needed for wheezing or shortness of breath. 09/18/18   Martyn Ehrich, NP  benzonatate (TESSALON) 100 MG capsule TAKE 1 CAPSULE BY MOUTH EVERY 6 HOURS AS NEEDED FOR COUGH 10/19/18   Collene Gobble, MD     Positive ROS: Otherwise  negative  All other systems have been reviewed and were otherwise negative with the exception of those mentioned in the HPI and as above.  Physical Exam: Constitutional: Alert, well-appearing, no acute distress Ears: External ears without lesions or tenderness. Ear canals are clear bilaterally with intact, clear TMs.  Nasal: External nose without lesions. Septum relatively midline.. Clear nasal passages.  No evidence of infection. Oral: Lips and gums without lesions. Tongue has a 12 to 14 mm submucosal nodule toward the distal right end of the tongue.  Overlying mucosa appears normal the nodule is easily palpable with my fingers.  It feels round and not adherent.  It is not sore.  Possibly could be cystic lesion versus solid mass.  It would be easy to remove under local anesthetic.  Posterior oropharynx was clear.  Floor mouth is normal. Neck: No palpable adenopathy or masses.  No submandibular lymphadenopathy noted. Respiratory: Breathing comfortably  Skin: No facial/neck lesions or rash noted.  Procedures  Assessment: Right sided tongue lesion on clinical exam this appears benign  Plan: I discussed with the patient that I would probably recommend having this removed under local anesthetic at his convenience in the next few weeks.  If it decreases in size could observe it. Patient will call us back in a few weeks to schedule excision of the tongue mass if it persist.  we will schedule this under local anesthetic for 30 minutes.   Radene Journey, MD

## 2019-03-10 ENCOUNTER — Ambulatory Visit: Payer: BC Managed Care – PPO

## 2019-03-10 ENCOUNTER — Telehealth: Payer: Self-pay | Admitting: Emergency Medicine

## 2019-03-10 NOTE — Telephone Encounter (Signed)
Chest ct@[facility]  04/16/19@9 :00am pt aware will see dr byrum 04/22/19#9:15 Joellen Jersey

## 2019-03-19 ENCOUNTER — Inpatient Hospital Stay: Admission: RE | Admit: 2019-03-19 | Payer: BC Managed Care – PPO | Source: Ambulatory Visit

## 2019-04-15 DIAGNOSIS — Z0000 Encounter for general adult medical examination without abnormal findings: Secondary | ICD-10-CM | POA: Diagnosis not present

## 2019-04-15 DIAGNOSIS — N529 Male erectile dysfunction, unspecified: Secondary | ICD-10-CM | POA: Diagnosis not present

## 2019-04-15 DIAGNOSIS — C9110 Chronic lymphocytic leukemia of B-cell type not having achieved remission: Secondary | ICD-10-CM | POA: Diagnosis not present

## 2019-04-15 DIAGNOSIS — K148 Other diseases of tongue: Secondary | ICD-10-CM | POA: Diagnosis not present

## 2019-04-15 DIAGNOSIS — I2510 Atherosclerotic heart disease of native coronary artery without angina pectoris: Secondary | ICD-10-CM | POA: Diagnosis not present

## 2019-04-15 DIAGNOSIS — N400 Enlarged prostate without lower urinary tract symptoms: Secondary | ICD-10-CM | POA: Diagnosis not present

## 2019-04-16 ENCOUNTER — Ambulatory Visit (INDEPENDENT_AMBULATORY_CARE_PROVIDER_SITE_OTHER)
Admission: RE | Admit: 2019-04-16 | Discharge: 2019-04-16 | Disposition: A | Discharge status: Home / Self Care | Payer: BC Managed Care – PPO | Source: Ambulatory Visit | Attending: Emergency Medicine | Admitting: Emergency Medicine

## 2019-04-16 ENCOUNTER — Other Ambulatory Visit: Payer: Self-pay

## 2019-04-16 DIAGNOSIS — R911 Solitary pulmonary nodule: Secondary | ICD-10-CM | POA: Diagnosis not present

## 2019-04-16 DIAGNOSIS — J479 Bronchiectasis, uncomplicated: Secondary | ICD-10-CM

## 2019-04-20 ENCOUNTER — Encounter: Payer: Self-pay | Admitting: Physician Assistant

## 2019-04-20 ENCOUNTER — Ambulatory Visit: Payer: BC Managed Care – PPO | Admitting: Family Medicine

## 2019-04-20 ENCOUNTER — Other Ambulatory Visit: Payer: Self-pay

## 2019-04-20 VITALS — BP 100/60 | HR 67 | Ht 74.0 in | Wt 194.4 lb

## 2019-04-20 DIAGNOSIS — R0602 Shortness of breath: Secondary | ICD-10-CM | POA: Diagnosis not present

## 2019-04-20 DIAGNOSIS — I2510 Atherosclerotic heart disease of native coronary artery without angina pectoris: Secondary | ICD-10-CM | POA: Diagnosis not present

## 2019-04-20 DIAGNOSIS — E782 Mixed hyperlipidemia: Secondary | ICD-10-CM | POA: Diagnosis not present

## 2019-04-20 MED ORDER — ATORVASTATIN CALCIUM 80 MG PO TABS: 80.0000 mg | ORAL_TABLET | Freq: Every day | ORAL | 3 refills | Status: DC

## 2019-04-20 NOTE — Progress Notes (Addendum)
Cardiology Office Note  Date: 04/22/2019   ID: Jake Kennedy, DOB 1950-12-19, MRN [id number]  PCP:  [name], MD  Cardiologist:  Sherren Mocha, MD Electrophysiologist:  None   Chief Complaint: Follow-up CAD  History of Present Illness: Jake Kennedy is a 69 y.o. male with a history of CAD stenting of LAD in 2016 after presenting with chest discomfort with abnormal nuclear scan.  No recurrent ischemic events, recent diagnosis of CLL undergoing treatment for this problem.  He subsequently developed  cough and shortness of breath with bronchoscopy and biopsy.  It showed no evidence of malignancy.  Cough improved with Symbicort.  Patient states he been doing well.  States he exercises on alternate days and walks on the others.  States he is eating well.  He denies any progressive anginal or exertional symptoms.  No complaints of orthostatic symptoms/near syncopal or syncopal episodes, palpitations or arrhythmias, CVA or TIA-like symptoms, dyspeptic symptoms, blood in stool or urine.  Denies any claudication-like symptoms, DVT or PE-like symptoms, or lower extremity edema.  States he recently had a CT scan of his chest. for his CLL which has some comments about his coronary artery disease.  CT scan reports he does have aortic atherosclerosis in addition to left main and two-vessel coronary artery disease with some calcification of the aortic valve.     Past Medical History:  Diagnosis Date  . Basal cell carcinoma    moles removed in 2007 and 2012 from L nose and L hip  . BPH (benign prostatic hypertrophy)   . CAD (coronary artery disease), native coronary artery    a. 02/10/2015 95% mid LAD stenosis s/p DES  b. 07/15/16: LHC showed stable non obst dz and patent stent  . CLL (chronic lymphocytic leukemia) ([place])   . Coronary artery disease   . ED (erectile dysfunction)   . Lymphocytosis 02/11/2015   seen by Dr. Lindi Adie 02/11/2015, working up for possible CLL    Past Surgical History:    Procedure Laterality Date  . CARDIAC CATHETERIZATION  02/10/2015  . CARDIAC CATHETERIZATION N/A 02/10/2015   Procedure: Left Heart Cath and Coronary Angiography;  Surgeon: Sherren Mocha, MD;  Location: [place] [facility];  Service: Cardiovascular;  Laterality: N/A;  . CARDIAC CATHETERIZATION N/A 02/10/2015   Procedure: Coronary Stent Intervention;  Surgeon: Sherren Mocha, MD;  Location: [place] [facility];  Service: Cardiovascular;  Laterality: N/A;  . CORONARY STENT PLACEMENT  02/10/2015   DES to LAD  . LAMINECTOMY    . LEFT HEART CATH AND CORONARY ANGIOGRAPHY N/A 07/15/2016   Procedure: Left Heart Cath and Coronary Angiography;  Surgeon: Martinique, Peter M, MD;  Location: [place] [facility];  Service: Cardiovascular;  Laterality: N/A;  . TONSILLECTOMY    . VIDEO BRONCHOSCOPY WITH ENDOBRONCHIAL NAVIGATION N/A 09/16/2018   Procedure: VIDEO BRONCHOSCOPY WITH ENDOBRONCHIAL NAVIGATION AND BIOPIES;  Surgeon: Collene Gobble, MD;  Location: [facility];  Service: Thoracic;  Laterality: N/A;    Current Outpatient Medications  Medication Sig Dispense Refill  . aspirin 81 MG tablet Take 81 mg by mouth daily.    Marland Kitchen atorvastatin (LIPITOR) 80 MG tablet Take 1 tablet (80 mg total) by mouth daily at 6 PM. 90 tablet 3  . Multiple Vitamin (MULTIVITAMIN) tablet Take 1 tablet by mouth daily.    . Multiple Vitamins-Minerals (PRESERVISION AREDS PO) Take 2 capsules by mouth daily.    . nitroGLYCERIN (NITROSTAT) 0.4 MG SL tablet Place 1 tablet (0.4 mg total) under  the tongue every 5 (five) minutes as needed for chest pain. 25 tablet 3  . Omega-3 Fatty Acids (FISH OIL CONCENTRATE PO) Take 1 capsule by mouth daily.     Marland Kitchen Respiratory Therapy Supplies (FLUTTER) DEVI Use as directed 1 each 0  . vardenafil (LEVITRA) 10 MG tablet Take 10 mg by mouth as needed for erectile dysfunction.      No current facility-administered medications for this visit.   Allergies:  Adhesive [tape] and Latex   Social History: The patient   reports that he has never smoked. He has never used smokeless tobacco. He reports current alcohol use. He reports that he does not use drugs.   Family History: The patient's family history includes Diabetes in his mother; Healthy in his brother, brother, maternal grandfather, maternal grandmother, paternal grandfather, paternal grandmother, and sister; Heart attack in his mother; Heart failure in his mother.   ROS:  Please see the history of present illness. Otherwise, complete review of systems is positive for none.  All other systems are reviewed and negative.   Physical Exam: VS:  BP 100/60   Pulse 67   Ht 6\' 2"  (1.88 m)   Wt 194 lb 6.4 oz (88.2 kg)   SpO2 95%   BMI 24.96 kg/m , BMI Body mass index is 24.96 kg/m.  Wt Readings from Last 3 Encounters:  04/20/19 194 lb 6.4 oz (88.2 kg)  01/15/19 187 lb 12.8 oz (85.2 kg)  10/27/18 168 lb (76.2 kg)    General: Patient appears comfortable at rest. Neck: Supple, no elevated JVP or carotid bruits, no thyromegaly. Lungs: Clear to auscultation, nonlabored breathing at rest. Cardiac: Regular rate and rhythm, no S3 or significant systolic murmur, no pericardial rub. Extremities: No pitting edema, distal pulses 2+. Skin: Warm and dry. Musculoskeletal: No kyphosis. Neuropsychiatric: Alert and oriented x3, affect grossly appropriate.  ECG:  An ECG dated 04/20/2019 was personally reviewed today and demonstrated:  Normal sinus rhythm rate of 67, no acute ST or T wave abnormalities,  Recent Labwork: 01/15/2019: ALT 32; AST 19; BUN 23; Creatinine 0.92; Hemoglobin 16.3; Platelets 178; Potassium 4.3; Sodium 141; TSH 2.227     Component Value Date/Time   CHOL 117 10/16/2018 1125   CHOL 83 (L) 11/11/2017 1010   TRIG 69 10/16/2018 1125   HDL 35 (L) 10/16/2018 1125   HDL 23 (L) 11/11/2017 1010   CHOLHDL 3.3 10/16/2018 1125   VLDL 14 10/16/2018 1125   LDLCALC 68 10/16/2018 1125   LDLCALC 45 11/11/2017 1010    Other Studies Reviewed  Today:   Echocardiogram 10/09/2018  1. The left ventricle has normal systolic function, with an ejection fraction of 55-60%. The cavity size was normal. Left ventricular diastolic Doppler parameters are consistent with impaired relaxation. No evidence of left ventricular regional wall motion abnormalities. 2. The right ventricle has normal systolic function. The cavity was normal. There is no increase in right ventricular wall thickness. Right ventricular systolic pressure could not be assessed. 3. The mitral valve is myxomatous. Mild thickening of the mitral valve leaflet. 4. The aortic valve is tricuspid. Mild thickening of the aortic valve. Mild calcification of the aortic valve. 5. The aorta is normal unless otherwise noted.   Cardiac catheterization 07/15/2016  Mid RCA lesion, 50 %stenosed.  Mid LAD-2 lesion, 0 %stenosed.  A drug eluting is in place  Mid LAD-1 lesion, 30 %stenosed.  1st Diag lesion, 30 %stenosed.  The left ventricular systolic function is normal.  LV end diastolic pressure is  normal.  The left ventricular ejection fraction is 55-65% by visual estimate.   1. Nonobstructive CAD. The stent in the mid LAD is widely patent. There is 30% narrowing proximal to the stent. The RCA is a large and ectatic vessel with 50% stenosis in the mid vessel. This is unchanged from prior studies. 2. Normal LV function 3. Normal LVEDP.  Plan: no culprit lesion to explain his current chest pain. Recommend continued medical therapy.    Assessment and Plan:  1. Coronary artery disease involving native coronary artery of native heart without angina pectoris   2. Mixed hyperlipidemia    1. Coronary artery disease involving native coronary artery of native heart without angina pectoris He denies any recent anginal symptoms.  He is exercising on a regular basis including resistance training and walking.  States he does do resistance training 3 days a week and walking many at  least 3 days a  week.  Continue aspirin 81 mg daily, nitroglycerin 0.4 mg sublingual as needed chest pain.  2. Mixed hyperlipidemia Last lipid panel 6 months ago.  Total cholesterol 117, triglycerides 69, HDL 35, LDL 68.  Continue atorvastatin 80 mg daily.  Medication Adjustments/Labs and Tests Ordered: Current medicines are reviewed at length with the patient today.  Concerns regarding medicines are outlined above.   Disposition: Follow-up with Dr. Burt Knack or APP 1 year  Signed, Levell July, NP 04/22/2019 8:14 AM    [place]

## 2019-04-20 NOTE — Patient Instructions (Signed)
Medication Instructions:   Your physician recommends that you continue on your current medications as directed. Please refer to the Current Medication list given to you today.  *If you need a refill on your cardiac medications before your next appointment, please call your pharmacy*  Lab Work:  None ordered today  Testing/Procedures:  None ordered today  Follow-Up: At [facility], you and your health needs are our priority.  As part of our continuing mission to provide you with exceptional heart care, we have created designated Provider Care Teams.  These Care Teams include your primary Cardiologist (physician) and Advanced Practice Providers (APPs -  Physician Assistants and Nurse Practitioners) who all work together to provide you with the care you need, when you need it.  We recommend signing up for the patient portal called "MyChart".  Sign up information is provided on this After Visit Summary.  MyChart is used to connect with patients for Virtual Visits (Telemedicine).  Patients are able to view lab/test results, encounter notes, upcoming appointments, etc.  Non-urgent messages can be sent to your provider as well.   To learn more about what you can do with MyChart, go to [email or web address].    Your next appointment:   12 month(s)  The format for your next appointment:   In Person  Provider:   Michael Cooper, MD   

## 2019-04-22 ENCOUNTER — Ambulatory Visit: Payer: BC Managed Care – PPO | Admitting: Emergency Medicine

## 2019-04-22 NOTE — Addendum Note (Signed)
Addended by: Luiz Blare on: 04/22/2019 08:13 AM   Modules accepted: Level of Service

## 2019-04-23 ENCOUNTER — Ambulatory Visit: Payer: BC Managed Care – PPO | Admitting: Emergency Medicine

## 2019-04-23 ENCOUNTER — Other Ambulatory Visit: Payer: Self-pay

## 2019-04-23 ENCOUNTER — Encounter: Payer: Self-pay | Admitting: Emergency Medicine

## 2019-04-23 VITALS — BP 110/70 | HR 62 | Temp 97.0°F | Ht 74.0 in | Wt 196.6 lb

## 2019-04-23 DIAGNOSIS — R05 Cough: Secondary | ICD-10-CM | POA: Diagnosis not present

## 2019-04-23 DIAGNOSIS — R053 Chronic cough: Secondary | ICD-10-CM

## 2019-04-23 DIAGNOSIS — R911 Solitary pulmonary nodule: Secondary | ICD-10-CM

## 2019-04-23 DIAGNOSIS — R9389 Abnormal findings on diagnostic imaging of other specified body structures: Secondary | ICD-10-CM

## 2019-04-23 NOTE — Telephone Encounter (Signed)
RB please advise on what to include in letter to excuse patient from jury duty.  Thanks!

## 2019-04-23 NOTE — Assessment & Plan Note (Signed)
Overall improved with less evidence for interstitial inflammation.  He still has some bronchiectatic change, stable left pulmonary nodule.  We will follow this in 1 year for stability and if unchanged and the mid benign.  No clear indication based on CT appearance or culture data to treat for atypical mycobacterial disease.

## 2019-04-23 NOTE — Progress Notes (Signed)
Subjective:    Patient ID: Jake Kennedy, male    DOB: 1950/03/04, 69 y.o.   MRN: [id number]  HPI 69 year old never smoker with a history of coronary disease, CLL.  Underwent chemotherapy with dexamethasone, Veneclax, but had to be stopped due to leukopenia. Then started obinutuzumab by Dr. Irene Limbo in June 2020. He has had a return of weakness, dyspnea, LAD, cough, wt loss. He is bringing up yellowish mucous, coughs every morning.   He is referred today for evaluation of an persistently abnormal CT chest.   CT chest on 7/7 was reviewed by me, shows a medial right lower lobe paraspinal opacity with an irregular border, diffuse bronchial wall thickening and some mild bronchiectatic change.  This is compared with a CT chest from 03/19/2018 and shows an interval improvement in base predominant peripheral peribronchovascular nodularity.  CT neck showed decreased cervical lymphadenopathy.  ROV 10/01/2018 --Mr. Harewood follows up today for his abnormal CT scan of the chest.  He has CLL followed by Dr. Irene Limbo, is currently on observation.  We performed bronchoscopy on 09/16/2018 to investigate a medial right lower lobe paraspinal opacity of unclear etiology.  All of his cytologies are negative, flow cytometry was not sent given the histo path.  Culture data is all negative so far, AFB not yet final.    We treated him for a sinusitis + bronchitis last week w levaquin - had yellow mucous. Symbicort was added empirically, started nasacort and mucinex. He is using albuterol bid. He is coughing less, has less sinus drainage. His next CT chest is next Friday. Still with some yellow sputum. He has a TTE scheduled for next week w Dr Burt Knack to eval for any possible cards component to his SOB.  ROV 10/27/2018 --this is a follow-up visit for patient with a history of CLL and an abnormal CT scan of the chest.  He underwent a reassuring bronchoscopy 09/16/2018, cytology and culture negative.  The final AFB cultures are still  pending, smears negative.  He had a repeat CT scan of the chest on 10/09/2018 that I reviewed, shows large-scale resolution of his medial right lower lobe nodular process compared with February.  He still has some areas of bronchiectatic change and subpleural nodular densities, all improved. He is in remission regarding his CLL, on observation. He is clearing secretions every morning. He is using a flutter, mucinex, albuterol bid - helps his cough, most helpful in the am. He is on Nasocort. Has tessalon to use prn at night.    ROV 04/23/19 --69 year old never smoker with CLL followed for an abnormal CT scan of the chest with bronchiectatic change and some subpleural micronodular disease.  Underwent reassuring bronchoscopy 09/2018.  We repeated his CT on 04/16/2019 and I have reviewed, this shows overall improvement with less peribronchovascular thickening and micronodularity.  He has mild cylindrical bronchiectasis.  There is a stable 9.8 mm left lower lobe nodule.  His cough is in the am, clears mucous and then is not bothered through the day. He stopped nasacort, mucinex. He stopped flutter valve for now. He is using nasal saline. He has had his covid vaccines.  MDM: -Reviewed CT scan of the chest 04/16/2019 -Reviewed office notes from Dr. Irene Limbo 01/15/19 and Dr Lucia Gaskins 02/10/19  Review of Systems  Constitutional: Negative for fatigue, fever and unexpected weight change.  HENT: Positive for congestion. Negative for dental problem, ear pain, nosebleeds, postnasal drip, rhinorrhea, sinus pressure, sneezing, sore throat and trouble swallowing.   Eyes: Negative for  redness and itching.  Respiratory: Positive for cough. Negative for chest tightness, shortness of breath and wheezing.   Cardiovascular: Negative for palpitations and leg swelling.  Gastrointestinal: Negative for nausea and vomiting.  Genitourinary: Negative for dysuria.  Musculoskeletal: Negative for joint swelling.  Skin: Negative for rash.   Neurological: Negative for headaches.  Hematological: Does not bruise/bleed easily.  Psychiatric/Behavioral: Negative for dysphoric mood. The patient is not nervous/anxious.     Past Medical History:  Diagnosis Date  . Basal cell carcinoma    moles removed in 2007 and 2012 from L nose and L hip  . BPH (benign prostatic hypertrophy)   . CAD (coronary artery disease), native coronary artery    a. 02/10/2015 95% mid LAD stenosis s/p DES  b. 07/15/16: LHC showed stable non obst dz and patent stent  . CLL (chronic lymphocytic leukemia) ([place])   . Coronary artery disease   . ED (erectile dysfunction)   . Lymphocytosis 02/11/2015   seen by Dr. Lindi Adie 02/11/2015, working up for possible CLL     Family History  Problem Relation Age of Onset  . Diabetes Mother   . Heart failure Mother   . Heart attack Mother        occured in 59s.   . Healthy Sister   . Healthy Brother   . Healthy Maternal Grandmother   . Healthy Maternal Grandfather   . Healthy Paternal Grandmother   . Healthy Paternal Grandfather   . Healthy Brother      Social History   Socioeconomic History  . Marital status: Married    Spouse name: Not on file  . Number of children: Not on file  . Years of education: Not on file  . Highest education level: Not on file  Occupational History  . Not on file  Tobacco Use  . Smoking status: Never Smoker  . Smokeless tobacco: Never Used  Substance and Sexual Activity  . Alcohol use: Yes    Alcohol/week: 0.0 standard drinks    Comment: occasional  . Drug use: No  . Sexual activity: Not on file  Other Topics Concern  . Not on file  Social History Narrative  . Not on file   Social Determinants of Health   Financial Resource Strain:   . Difficulty of Paying Living Expenses:   Food Insecurity:   . Worried About Charity fundraiser in the Last Year:   . Arboriculturist in the Last Year:   Transportation Needs:   . Film/video editor (Medical):   Marland Kitchen Lack of  Transportation (Non-Medical):   Physical Activity:   . Days of Exercise per Week:   . Minutes of Exercise per Session:   Stress:   . Feeling of Stress :   Social Connections:   . Frequency of Communication with Friends and Family:   . Frequency of Social Gatherings with Friends and Family:   . Attends Religious Services:   . Active Member of Clubs or Organizations:   . Attends Archivist Meetings:   Marland Kitchen Marital Status:   Intimate Partner Violence:   . Fear of Current or Ex-Partner:   . Emotionally Abused:   Marland Kitchen Physically Abused:   . 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   Allergies  Allergen Reactions  . Adhesive [Tape]     Blisters & hives - Paper tape ok  . Latex Itching    Blisters and itching -  pt states that it's not Latex but it's adhesives     Outpatient Medications Prior to Visit  Medication Sig Dispense Refill  . aspirin 81 MG tablet Take 81 mg by mouth daily.    Marland Kitchen atorvastatin (LIPITOR) 80 MG tablet Take 1 tablet (80 mg total) by mouth daily at 6 PM. 90 tablet 3  . Multiple Vitamin (MULTIVITAMIN) tablet Take 1 tablet by mouth daily.    . Multiple Vitamins-Minerals (PRESERVISION AREDS PO) Take 2 capsules by mouth daily.    . nitroGLYCERIN (NITROSTAT) 0.4 MG SL tablet Place 1 tablet (0.4 mg total) under the tongue every 5 (five) minutes as needed for chest pain. 25 tablet 3  . Omega-3 Fatty Acids (FISH OIL CONCENTRATE PO) Take 1 capsule by mouth daily.     . vardenafil (LEVITRA) 10 MG tablet Take 10 mg by mouth as needed for erectile dysfunction.     Marland Kitchen Respiratory Therapy Supplies (FLUTTER) DEVI Use as directed (Patient not taking: Reported on 04/23/2019) 1 each 0   No facility-administered medications prior to visit.        Objective:   Physical Exam Vitals:   04/23/19 0853  BP: 110/70  Pulse: 62  Temp: (!) 97 F (36.1 C)  TempSrc: Temporal  SpO2: 96%  Weight: 196 lb 9.6 oz (89.2 kg)  Height: 6\' 2"   (1.88 m)   Gen: Pleasant, tall and thin, in no distress,  normal affect  ENT: No lesions,  mouth clear,  oropharynx clear, no postnasal drip  Neck: No JVD, no stridor  Lungs: No use of accessory muscles, no crackles or wheezing on normal respiration, no wheeze on forced expiration  Cardiovascular: RRR, heart sounds normal, no murmur or gallops, no peripheral edema  Musculoskeletal: No deformities, no cyanosis or clubbing  Neuro: alert, awake, non focal  Skin: Warm, no lesions or rash      Assessment & Plan:  Abnormal CT of the chest Overall improved with less evidence for interstitial inflammation.  He still has some bronchiectatic change, stable left pulmonary nodule.  We will follow this in 1 year for stability and if unchanged and the mid benign.  No clear indication based on CT appearance or culture data to treat for atypical mycobacterial disease.  Chronic cough Improved.  He is having less difficulty with chronic rhinitis.  He may need to restart his allergy regimen if symptoms begin to flare.  He is not requiring his flutter valve currently  [name], MD, PhD 04/23/2019, 9:23 AM [facility] [phone] or if no answer [phone]

## 2019-04-23 NOTE — Telephone Encounter (Signed)
Will wait to get date of [place] duty from patient and write letter with juror number [phone].

## 2019-04-23 NOTE — Patient Instructions (Addendum)
We will plan to repeat your CT chest in 04/2020 to follow your pulmonary nodule and bronchiectasis Continue your nasal saline rinses.  If your nasal congestion and cough increase, then consider restarting your Nasacort, flutter valve and mucinex.  Follow with Dr Lamonte Sakai in March 2022 after your CT chest or sooner if you have any problems.

## 2019-04-23 NOTE — Assessment & Plan Note (Signed)
Improved.  He is having less difficulty with chronic rhinitis.  He may need to restart his allergy regimen if symptoms begin to flare.  He is not requiring his flutter valve currently

## 2019-04-26 ENCOUNTER — Encounter: Payer: Self-pay | Admitting: Emergency Medicine

## 2019-04-26 NOTE — Telephone Encounter (Signed)
Please write that the patient is followed by me for chronic lung disease and also has a history of CLL.  I am recommending that the patient defer his jury duty due to his high risk for severe illness should he be exposed to your contract COVID-19.  He would be able to complete his jury responsibilities at a later date when the exposure risk decreases.

## 2019-05-14 DIAGNOSIS — L570 Actinic keratosis: Secondary | ICD-10-CM | POA: Diagnosis not present

## 2019-05-14 DIAGNOSIS — L820 Inflamed seborrheic keratosis: Secondary | ICD-10-CM | POA: Diagnosis not present

## 2019-05-14 DIAGNOSIS — L821 Other seborrheic keratosis: Secondary | ICD-10-CM | POA: Diagnosis not present

## 2019-05-14 DIAGNOSIS — D225 Melanocytic nevi of trunk: Secondary | ICD-10-CM | POA: Diagnosis not present

## 2019-05-14 DIAGNOSIS — D2261 Melanocytic nevi of right upper limb, including shoulder: Secondary | ICD-10-CM | POA: Diagnosis not present

## 2019-05-14 DIAGNOSIS — Z85828 Personal history of other malignant neoplasm of skin: Secondary | ICD-10-CM | POA: Diagnosis not present

## 2019-05-14 DIAGNOSIS — C44519 Basal cell carcinoma of skin of other part of trunk: Secondary | ICD-10-CM | POA: Diagnosis not present

## 2019-05-14 DIAGNOSIS — D485 Neoplasm of uncertain behavior of skin: Secondary | ICD-10-CM | POA: Diagnosis not present

## 2019-05-14 DIAGNOSIS — D2262 Melanocytic nevi of left upper limb, including shoulder: Secondary | ICD-10-CM | POA: Diagnosis not present

## 2019-05-17 ENCOUNTER — Inpatient Hospital Stay: Payer: BC Managed Care – PPO | Admitting: Hematology

## 2019-05-17 ENCOUNTER — Inpatient Hospital Stay: Payer: BC Managed Care – PPO | Attending: Hematology

## 2019-05-17 ENCOUNTER — Other Ambulatory Visit: Payer: Self-pay

## 2019-05-17 VITALS — BP 112/79 | HR 72 | Temp 98.3°F | Resp 18 | Ht 74.0 in | Wt 190.1 lb

## 2019-05-17 DIAGNOSIS — I2510 Atherosclerotic heart disease of native coronary artery without angina pectoris: Secondary | ICD-10-CM | POA: Insufficient documentation

## 2019-05-17 DIAGNOSIS — N400 Enlarged prostate without lower urinary tract symptoms: Secondary | ICD-10-CM | POA: Diagnosis not present

## 2019-05-17 DIAGNOSIS — Z85820 Personal history of malignant melanoma of skin: Secondary | ICD-10-CM | POA: Insufficient documentation

## 2019-05-17 DIAGNOSIS — N529 Male erectile dysfunction, unspecified: Secondary | ICD-10-CM | POA: Insufficient documentation

## 2019-05-17 DIAGNOSIS — I700 Atherosclerosis of aorta: Secondary | ICD-10-CM | POA: Insufficient documentation

## 2019-05-17 DIAGNOSIS — Z7982 Long term (current) use of aspirin: Secondary | ICD-10-CM | POA: Insufficient documentation

## 2019-05-17 DIAGNOSIS — Z79899 Other long term (current) drug therapy: Secondary | ICD-10-CM | POA: Diagnosis not present

## 2019-05-17 DIAGNOSIS — R911 Solitary pulmonary nodule: Secondary | ICD-10-CM | POA: Diagnosis not present

## 2019-05-17 DIAGNOSIS — C9110 Chronic lymphocytic leukemia of B-cell type not having achieved remission: Secondary | ICD-10-CM | POA: Insufficient documentation

## 2019-05-17 DIAGNOSIS — K148 Other diseases of tongue: Secondary | ICD-10-CM

## 2019-05-17 LAB — CMP (CANCER CENTER ONLY)
ALT: 36 U/L (ref 0–44)
AST: 21 U/L (ref 15–41)
Albumin: 4.2 g/dL (ref 3.5–5.0)
Alkaline Phosphatase: 90 U/L (ref 38–126)
Anion gap: 10 (ref 5–15)
BUN: 22 mg/dL (ref 8–23)
CO2: 28 mmol/L (ref 22–32)
Calcium: 9 mg/dL (ref 8.9–10.3)
Chloride: 103 mmol/L (ref 98–111)
Creatinine: 1.03 mg/dL (ref 0.61–1.24)
GFR, Est AFR Am: >60 mL/min (ref >60)
GFR, Estimated: >60 mL/min (ref >60)
Glucose, Bld: 138 mg/dL — ABNORMAL HIGH (ref 70–99)
Potassium: 4.7 mmol/L (ref 3.5–5.1)
Sodium: 141 mmol/L (ref 135–145)
Total Bilirubin: 0.9 mg/dL (ref 0.3–1.2)
Total Protein: 6.5 g/dL (ref 6.5–8.1)

## 2019-05-17 LAB — CBC WITH DIFFERENTIAL/PLATELET
Abs Immature Granulocytes: 0.03 10*3/uL (ref 0.00–0.07)
Basophils Absolute: 0 10*3/uL (ref 0.0–0.1)
Basophils Relative: 0 %
Eosinophils Absolute: 0.1 10*3/uL (ref 0.0–0.5)
Eosinophils Relative: 2 %
HCT: 50.9 % (ref 39.0–52.0)
Hemoglobin: 16.8 g/dL (ref 13.0–17.0)
Immature Granulocytes: 1 %
Lymphocytes Relative: 14 %
Lymphs Abs: 0.6 10*3/uL — ABNORMAL LOW (ref 0.7–4.0)
MCH: 29.8 pg (ref 26.0–34.0)
MCHC: 33 g/dL (ref 30.0–36.0)
MCV: 90.2 fL (ref 80.0–100.0)
Monocytes Absolute: 0.5 10*3/uL (ref 0.1–1.0)
Monocytes Relative: 11 %
Neutro Abs: 3.4 10*3/uL (ref 1.7–7.7)
Neutrophils Relative %: 72 %
Platelets: 157 10*3/uL (ref 150–400)
RBC: 5.64 MIL/uL (ref 4.22–5.81)
RDW: 13.3 % (ref 11.5–15.5)
WBC: 4.7 10*3/uL (ref 4.0–10.5)
nRBC: 0 % (ref 0.0–0.2)

## 2019-05-17 LAB — LACTATE DEHYDROGENASE: LDH: 179 U/L (ref 98–192)

## 2019-05-17 NOTE — Progress Notes (Signed)
HEMATOLOGY/ONCOLOGY CLINIC NOTE  Date of Service: 05/17/2019  Patient Care Team: [name], MD as PCP - General (Internal Medicine) Sherren Mocha, MD as PCP - Cardiology (Cardiology)  CHIEF COMPLAINTS/PURPOSE OF CONSULTATION:  Chronic Lymphocytic Leukemia  Oncologic History:   Oncology History  Chronic lymphocytic leukemia (CLL), B-cell ([place])  02/14/2015 Initial Diagnosis   Monoclonal B-cell population CD5 and CD20 positive, differential diagnosis CLL versus mantle cell lymphoma ( patient had absolute lymphocyte count of 8000 in 2010), remained stable.   07/09/2018 -  Chemotherapy   The patient had obinutuzumab (GAZYVA) 100 mg in sodium chloride 0.9 % 100 mL (0.9615 mg/mL) chemo infusion, 100 mg, Intravenous, Once, 2 of 6 cycles Administration: 100 mg (07/09/2018), 900 mg (07/10/2018), 1,000 mg (07/31/2018)  for chemotherapy treatment.       HISTORY OF PRESENTING ILLNESS:   Jake Kennedy is a wonderful 69 y.o. male who has been referred to [place] by Dr. [name] for evaluation and management of Chronic Lymphocytic Leukemia. The pt was formerly under the care of my colleague Dr. Nicholas Lose. He is accompanied today by his wife. The pt reports that he is doing well overall.  The pt reports that his course of CLL had been pretty consistent since diagnosis in 2017, until roughly 3 months ago in roughly November 2019. He notes that he has had recent increase in fatigue in the last 3 months, and more acutely in the last 3 weeks. He endorses occasional fevers over the last two weeks during the night, highest at 100.2 or 100.3. He also endorses some night sweats, not every night, some of these being "drenching." He notes that he has been eating well for the last 6 months, however has lost 8 pounds in that interim. He endorses regular exercising until the past month, due to his worsened fatigue, and had previously regularly run upwards of 3 miles and bike for several miles as well.  The pt  notes that he can't smell very well, in the last 2-3 weeks, and his hearing has also reduced in the last week. He senses that his enlarged lymph nodes are pressing on his neck, and characterizes this as similar to when one has an acute cold and congestion. He had acute pain in his right hip a week ago and saw my colleague Sandi Mealy, PA in Symptom Managment. He was given Norco but denies taking this because he didn't want to. He endorses a positional element to his hip pain, which e attributes to the pain being from an enlarged lymph node. He notes that his hip pain had sudden onset on the night of 03/18/18. He notes that his neck lymph nodes have grown in the last 3 weeks to a month. The pt notes that his lymph nodes in his neck, armpits, and groin have been enlarged.  The pt tried an antibiotic course 2 weeks ago with his PCP for a suspected sinus infection, during which his cervical lymph nodes enlarged further. He notes that his neck lymph nodes are somewhat improved today as compared to this. The pt denies frequent infections.   Of note prior to the patient's visit today, pt has had CT N/C/A/P completed on 03/19/18 with results revealing NECK: Multiple enlarged bilateral cervical lymph nodes consistent with the given diagnosis of CLL. The largest node is a right level 3 lymph node measuring 3.4 x 2.8 x 1.2 cm. 2. Degenerative changes of the cervical spine. 3. Hypodense lesion in the right side of the  tongue measuring up to 1.5 cm. This could represent focal infection. Neoplasm is considered less likely. The lesion appears to be superficial along the right lateral aspect of the anterior tongue a. This should be amenable to direct inspection, C/A/P: Marked adenopathy within the chest, abdomen, and pelvis, consistent with active lymphoma/leukemia. 2. Splenomegaly is nonspecific. Splenic involvement cannot be excluded. 3. Right greater than left and basilar predominant peribronchovascular interstitial thickening  and nodularity. Considerations include pulmonary involvement of lymphoma and/or Infection/aspiration. 4. Coronary artery atherosclerosis. Aortic Atherosclerosis. 5. Trace nonspecific pelvic fluid.  Most recent lab results (03/17/18) of CBC w/diff and CMP is as follows: all values are WNL except for WBC at 13.1k, RBC at 2.67, HGB at 10.1, HCT at 31.0, MCV at 116.1, MCH at 37.8, PLT at 103k, ANC at 900, Lymphs abs at 11.1k, BUN at 31, Calcium at 8.7, Total Protein at 6.2, Albumin at 3.4. 03/17/18 LDH at 241  On review of systems, pt reports new fatigue, recent fevers, recent night sweats, recently grown neck lymph nodes, eating well, some weight loss, right groin pain, and denies chills, SOB, ear pain, pain along the spine, frequent infections, flank pain, leg swelling, testicular pain or swelling, and any other symptoms.   Interval History:   Jake Kennedy returns today for management and evaluation of his CLL. The patient's last visit with [place] was on 01/15/2019. The pt reports that he is doing well overall.  The pt reports that he has been eating well, gaining weight, feeling good and doing the things that he wants to do. He has been staying active. Pt has recently returned to work and is taking safety precautions. He has had both doses of the COVID19 vaccine. He is interested in receiving his Shingrix vaccine.   He has a bump on the right side of his tongue that he first noticed about 3 months ago. The bump is soft, is not painful, has not bled, and has not leaked any fluid. He denies any injury to the area and believes that the growth has shrunken since he initially discovered it. Pt has previously spoken with Dr. Lucia Gaskins who decided not to do an extensive workup.   Of note since the patient's last visit, pt has had CT Chest ([id number]) completed on 04/16/2019 with results revealing "1. Overall today's study demonstrates improved appearance of the lungs with resolution of widespread thickening of the  peribronchovascular interstitium as well as scattered areas of peribronchovascular micronodularity seen throughout the lungs bilaterally. Mild cylindrical bronchiectasis is again noted. 2. Stable 9 x 8 mm pulmonary nodule in the left lower lobe (axial image 126 of series 3), strongly favored to be benign."  Lab results today (05/17/19) of CBC w/diff and CMP is as follows: all values are WNL except for Lymphs Abs at 0.6K, Glucose at 138. 05/17/2019 LDH at 179  On review of systems, pt reports tongue nodule and denies fatigue, fevers, chills, night sweats, unexpected weight loss and any other symptoms.    MEDICAL HISTORY:  Past Medical History:  Diagnosis Date  . Basal cell carcinoma    moles removed in 2007 and 2012 from L nose and L hip  . BPH (benign prostatic hypertrophy)   . CAD (coronary artery disease), native coronary artery    a. 02/10/2015 95% mid LAD stenosis s/p DES  b. 07/15/16: LHC showed stable non obst dz and patent stent  . CLL (chronic lymphocytic leukemia) ([place])   . Coronary artery disease   . ED (erectile dysfunction)   .  Lymphocytosis 02/11/2015   seen by Dr. Lindi Adie 02/11/2015, working up for possible CLL    SURGICAL HISTORY: Past Surgical History:  Procedure Laterality Date  . CARDIAC CATHETERIZATION  02/10/2015  . CARDIAC CATHETERIZATION N/A 02/10/2015   Procedure: Left Heart Cath and Coronary Angiography;  Surgeon: Sherren Mocha, MD;  Location: [place];  Service: Cardiovascular;  Laterality: N/A;  . CARDIAC CATHETERIZATION N/A 02/10/2015   Procedure: Coronary Stent Intervention;  Surgeon: Sherren Mocha, MD;  Location: [facility] [facility];  Service: Cardiovascular;  Laterality: N/A;  . CORONARY STENT PLACEMENT  02/10/2015   DES to LAD  . LAMINECTOMY    . LEFT HEART CATH AND CORONARY ANGIOGRAPHY N/A 07/15/2016   Procedure: Left Heart Cath and Coronary Angiography;  Surgeon: Martinique, Peter M, MD;  Location: [facility];  Service: Cardiovascular;   Laterality: N/A;  . TONSILLECTOMY    . VIDEO BRONCHOSCOPY WITH ENDOBRONCHIAL NAVIGATION N/A 09/16/2018   Procedure: VIDEO BRONCHOSCOPY WITH ENDOBRONCHIAL NAVIGATION AND BIOPIES;  Surgeon: Collene Gobble, MD;  Location: [facility];  Service: Thoracic;  Laterality: N/A;    SOCIAL HISTORY: Social History   Socioeconomic History  . Marital status: Married    Spouse name: Not on file  . Number of children: Not on file  . Years of education: Not on file  . Highest education level: Not on file  Occupational History  . Not on file  Tobacco Use  . Smoking status: Never Smoker  . Smokeless tobacco: Never Used  Substance and Sexual Activity  . Alcohol use: Yes    Alcohol/week: 0.0 standard drinks    Comment: occasional  . Drug use: No  . Sexual activity: Not on file  Other Topics Concern  . Not on file  Social History Narrative  . Not on file   Social Determinants of Health   Financial Resource Strain:   . Difficulty of Paying Living Expenses:   Food Insecurity:   . Worried About Charity fundraiser in the Last Year:   . Arboriculturist in the Last Year:   Transportation Needs:   . Film/video editor (Medical):   Marland Kitchen Lack of Transportation (Non-Medical):   Physical Activity:   . Days of Exercise per Week:   . Minutes of Exercise per Session:   Stress:   . Feeling of Stress :   Social Connections:   . Frequency of Communication with Friends and Family:   . Frequency of Social Gatherings with Friends and Family:   . Attends Religious Services:   . Active Member of Clubs or Organizations:   . Attends Archivist Meetings:   Marland Kitchen Marital Status:   Intimate Partner Violence:   . Fear of Current or Ex-Partner:   . Emotionally Abused:   Marland Kitchen Physically Abused:   . Sexually Abused:     FAMILY HISTORY: Family History  Problem Relation Age of Onset  . Diabetes Mother   . Heart failure Mother   . Heart attack Mother        occured in 41s.   . Healthy Sister   . Healthy  Brother   . Healthy Maternal Grandmother   . Healthy Maternal Grandfather   . Healthy Paternal Grandmother   . Healthy Paternal Grandfather   . Healthy Brother     ALLERGIES:  is allergic to adhesive [tape] and latex.  MEDICATIONS:  Current Outpatient Medications  Medication Sig Dispense Refill  . aspirin 81 MG tablet Take 81 mg  by mouth daily.    Marland Kitchen atorvastatin (LIPITOR) 80 MG tablet Take 1 tablet (80 mg total) by mouth daily at 6 PM. 90 tablet 3  . Multiple Vitamin (MULTIVITAMIN) tablet Take 1 tablet by mouth daily.    . Multiple Vitamins-Minerals (PRESERVISION AREDS PO) Take 2 capsules by mouth daily.    . nitroGLYCERIN (NITROSTAT) 0.4 MG SL tablet Place 1 tablet (0.4 mg total) under the tongue every 5 (five) minutes as needed for chest pain. 25 tablet 3  . Omega-3 Fatty Acids (FISH OIL CONCENTRATE PO) Take 1 capsule by mouth daily.     Marland Kitchen Respiratory Therapy Supplies (FLUTTER) DEVI Use as directed (Patient not taking: Reported on 04/23/2019) 1 each 0  . vardenafil (LEVITRA) 10 MG tablet Take 10 mg by mouth as needed for erectile dysfunction.      No current facility-administered medications for this visit.    REVIEW OF SYSTEMS:   A 10+ POINT REVIEW OF SYSTEMS WAS OBTAINED including neurology, dermatology, psychiatry, cardiac, respiratory, lymph, extremities, GI, GU, Musculoskeletal, constitutional, breasts, reproductive, HEENT.  All pertinent positives are noted in the HPI.  All others are negative.   PHYSICAL EXAMINATION: ECOG FS:1 - Symptomatic but completely ambulatory  Vitals:   05/17/19 1429  BP: 112/79  Pulse: 72  Resp: 18  Temp: 98.3 F (36.8 C)  SpO2: 98%   Wt Readings from Last 3 Encounters:  05/17/19 190 lb 1.6 oz (86.2 kg)  04/23/19 196 lb 9.6 oz (89.2 kg)  04/20/19 194 lb 6.4 oz (88.2 kg)   Body mass index is 24.41 kg/m.    GENERAL:alert, in no acute distress and comfortable SKIN: no acute rashes, no significant lesions EYES: conjunctiva are pink and  non-injected, sclera anicteric OROPHARYNX: MMM, no exudates, no oropharyngeal erythema or ulceration. Sizeable nodule on the right side of tongue. NECK: supple, no JVD LYMPH:  no palpable lymphadenopathy in the cervical, axillary or inguinal regions LUNGS: clear to auscultation b/l with normal respiratory effort HEART: regular rate & rhythm ABDOMEN:  normoactive bowel sounds , non tender, not distended. No palpable hepatosplenomegaly.  Extremity: no pedal edema PSYCH: alert & oriented x 3 with fluent speech NEURO: no focal motor/sensory deficits  LABORATORY DATA:  I have reviewed the data as listed  . CBC Latest Ref Rng & Units 05/17/2019 01/15/2019 10/16/2018  WBC 4.0 - 10.5 K/uL 4.7 3.7(L) 5.7  Hemoglobin 13.0 - 17.0 g/dL 16.8 16.3 15.2  Hematocrit 39.0 - 52.0 % 50.9 50.6 48.7  Platelets 150 - 400 K/uL 157 178 215  ANC 700<---600<---1200  . CMP Latest Ref Rng & Units 05/17/2019 01/15/2019 10/16/2018  Glucose 70 - 99 mg/dL 138(H) 81 95  BUN 8 - 23 mg/dL 22 23 17   Creatinine 0.61 - 1.24 mg/dL 1.03 0.92 0.80  Sodium 135 - 145 mmol/L 141 141 140  Potassium 3.5 - 5.1 mmol/L 4.7 4.3 4.6  Chloride 98 - 111 mmol/L 103 103 102  CO2 22 - 32 mmol/L 28 32 32  Calcium 8.9 - 10.3 mg/dL 9.0 8.8(L) 9.2  Total Protein 6.5 - 8.1 g/dL 6.5 6.2(L) 6.5  Total Bilirubin 0.3 - 1.2 mg/dL 0.9 0.7 1.0  Alkaline Phos 38 - 126 U/L 90 95 104  AST 15 - 41 U/L 21 19 19   ALT 0 - 44 U/L 36 32 34  01/04/2019 [place] TYROID (Accession [id number])    04/30/18 Outside Labs:                RADIOGRAPHIC STUDIES: I have personally  reviewed the radiological images as listed and agreed with the findings in the report. No results found.  ASSESSMENT & PLAN:  69 y.o. male with  1. Chronic Lymphocytic Leukemia Initial diagnosis on 02/13/15 Peripheral blood flow cytometry which revealed a Monoclonal B-cell population 09/25/15 Cytogenetics ruled out Mantle cell lymphoma. 12/25/17 FISH study ruled out  translocation 11;14 12/30/17 BM Biopsy revealed CLL without evidence of transformation  03/17/18 FISH CLL Prognostic panel revealed an 11q deletion and Trisomy 12  03/19/18 CT N/C/A/P revealed NECK: Multiple enlarged bilateral cervical lymph nodes consistent with the given diagnosis of CLL. The largest node is a right level 3 lymph node measuring 3.4 x 2.8 x 1.2 cm. 2. Degenerative changes of the cervical spine. 3. Hypodense lesion in the right side of the tongue measuring up to 1.5 cm. This could represent focal infection. Neoplasm is considered less likely. The lesion appears to be superficial along the right lateral aspect of the anterior tongue a. This should be amenable to direct inspection, C/A/P: Marked adenopathy within the chest, abdomen, and pelvis, consistent with active lymphoma/leukemia. 2. Splenomegaly is nonspecific. Splenic involvement cannot be excluded. 3. Right greater than left and basilar predominant peribronchovascular interstitial thickening and nodularity. Considerations include pulmonary involvement of lymphoma and/or Infection/aspiration. 4. Coronary artery atherosclerosis. Aortic Atherosclerosis. 5. Trace nonspecific pelvic fluid.  03/26/18 Left cervical LN biopsy revealed SLL/CLL  08/11/18 CT Neck revealed "Decreased cervical lymphadenopathy with all remaining nodes being subcentimeter in short axis. 2. Sinusitis."  08/11/18 CT Chest revealed "Interval improvement without resolution of the basilar predominant peripheral peribronchovascular nodularity. 2. Interval development of a new masslike consolidative opacity in the medial right lung measuring up to 3.3 cm. This may be infectious/inflammatory. Lymphoma involvement not excluded. 3. Interval resolution of splenomegaly with small volume perisplenic ascites identified today. 4.  Aortic Atherosclerosis."  #2 Lung Lesion -Pt is scheduled for a bronchoscopy on 09/16/2018 -Pt reports that he has not been out of the [place] much -No known  contact with tuberculosis  #3 Thyroid nodule- appears benign on Ultrasound. 10/09/2018 US THYROID (Accession [id number]) revealed "1. Solitary 1.2 cm benign colloid containing cyst within the left lobe of the thyroid does not meet imaging criteria to recommend percutaneous sampling or continued dedicated follow-up. 2. Otherwise, normal thyroid ultrasound."   PLAN: -Discussed pt labwork today, 05/17/19; WBC are normal, no anemia, blood counts are nml, LDH is WNL -Discussed 04/16/2019 CT Chest ([id number]) which revealed "1. Overall today's study demonstrates improved appearance of the lungs with resolution of widespread thickening of the peribronchovascular interstitium as well as scattered areas of peribronchovascular micronodularity seen throughout the lungs bilaterally. Mild cylindrical bronchiectasis is again noted. 2. Stable 9 x 8 mm pulmonary nodule in the left lower lobe (axial image 126 of series 3), strongly favored to be benign." -The pt shows no clinical or lab evidence of CLL recurrence/progression at this time -Recommend pt f/u with Dr. Lucia Gaskins for further tongue nodule evaluation -Will see back in 6 months   FOLLOW UP: RTC with Dr Irene Limbo with labs in 6 months   The total time spent in the appt was 20 minutes and more than 50% was on counseling and direct patient cares.  All of the patient's questions were answered with apparent satisfaction. The patient knows to call the clinic with any problems, questions or concerns.   Sullivan Lone MD [place] AAHIVMS [facility] Hematology/Oncology Physician [facility]  (Office):       [phone] (Work cell):  [phone] (Fax):  [phone]  05/17/2019 4:00 PM  I, Yevette Edwards, am acting as a Education administrator for Dr. Sullivan Lone.   .I have reviewed the above documentation for accuracy and completeness, and I agree with the above. Brunetta Genera MD

## 2019-05-18 ENCOUNTER — Telehealth: Payer: Self-pay | Admitting: Hematology

## 2019-05-18 NOTE — Telephone Encounter (Signed)
Scheduled per 04/12 los, patient has been called and notified. ?

## 2019-05-25 ENCOUNTER — Ambulatory Visit (INDEPENDENT_AMBULATORY_CARE_PROVIDER_SITE_OTHER): Payer: BC Managed Care – PPO | Admitting: Otolaryngology

## 2019-05-25 ENCOUNTER — Other Ambulatory Visit: Payer: Self-pay

## 2019-05-25 DIAGNOSIS — D3702 Neoplasm of uncertain behavior of tongue: Secondary | ICD-10-CM

## 2019-05-25 NOTE — Progress Notes (Signed)
HPI: Jake Kennedy is a 69 y.o. male who returns today for evaluation of right lateral tongue mass.  I reviewed the CT scans performed in February in July of last year and this was present on the initial scan CT scan in February.  He has approximate 1-1/2 cm round nodular lesion within the right anterior lateral tongue that centrally has very low density such as cyst or possible lipoma.  This was not read by radiologist but was seen on both CT scans with no significant change.  This is not tender and does not bother the patient but on exam of his tongue he has an obvious nodular submucosal lesion in the anterior lateral tongue on the right side.  Presents today for fine-needle aspirate.  Past Medical History:  Diagnosis Date  . Basal cell carcinoma    moles removed in 2007 and 2012 from L nose and L hip  . BPH (benign prostatic hypertrophy)   . CAD (coronary artery disease), native coronary artery    a. 02/10/2015 95% mid LAD stenosis s/p DES  b. 07/15/16: LHC showed stable non obst dz and patent stent  . CLL (chronic lymphocytic leukemia) ([place])   . Coronary artery disease   . ED (erectile dysfunction)   . Lymphocytosis 02/11/2015   seen by Dr. Lindi Adie 02/11/2015, working up for possible CLL   Past Surgical History:  Procedure Laterality Date  . CARDIAC CATHETERIZATION  02/10/2015  . CARDIAC CATHETERIZATION N/A 02/10/2015   Procedure: Left Heart Cath and Coronary Angiography;  Surgeon: Sherren Mocha, MD;  Location: [facility];  Service: Cardiovascular;  Laterality: N/A;  . CARDIAC CATHETERIZATION N/A 02/10/2015   Procedure: Coronary Stent Intervention;  Surgeon: Sherren Mocha, MD;  Location: [facility];  Service: Cardiovascular;  Laterality: N/A;  . CORONARY STENT PLACEMENT  02/10/2015   DES to LAD  . LAMINECTOMY    . LEFT HEART CATH AND CORONARY ANGIOGRAPHY N/A 07/15/2016   Procedure: Left Heart Cath and Coronary Angiography;  Surgeon: Martinique, Peter M, MD;  Location: [facility];  Service: Cardiovascular;  Laterality: N/A;  . TONSILLECTOMY    . VIDEO BRONCHOSCOPY WITH ENDOBRONCHIAL NAVIGATION N/A 09/16/2018   Procedure: VIDEO BRONCHOSCOPY WITH ENDOBRONCHIAL NAVIGATION AND BIOPIES;  Surgeon: Collene Gobble, MD;  Location: [facility];  Service: Thoracic;  Laterality: N/A;   Social History   Socioeconomic History  . Marital status: Married    Spouse name: Not on file  . Number of children: Not on file  . Years of education: Not on file  . Highest education level: Not on file  Occupational History  . Not on file  Tobacco Use  . Smoking status: Never Smoker  . Smokeless tobacco: Never Used  Substance and Sexual Activity  . Alcohol use: Yes    Alcohol/week: 0.0 standard drinks    Comment: occasional  . Drug use: No  . Sexual activity: Not on file  Other Topics Concern  . Not on file  Social History Narrative  . Not on file   Social Determinants of Health   Financial Resource Strain:   . Difficulty of Paying Living Expenses:   Food Insecurity:   . Worried About Charity fundraiser in the Last Year:   . Arboriculturist in the Last Year:   Transportation Needs:   . Film/video editor (Medical):   Marland Kitchen Lack of Transportation (Non-Medical):   Physical Activity:   . Days of Exercise per Week:   .  Minutes of Exercise per Session:   Stress:   . Feeling of Stress :   Social Connections:   . Frequency of Communication with Friends and Family:   . Frequency of Social Gatherings with Friends and Family:   . Attends Religious Services:   . Active Member of Clubs or Organizations:   . Attends Archivist Meetings:   Marland Kitchen Marital Status:    Family History  Problem Relation Age of Onset  . Diabetes Mother   . Heart failure Mother   . Heart attack Mother        occured in 37s.   . Healthy Sister   . Healthy Brother   . Healthy Maternal Grandmother   . Healthy Maternal Grandfather   . Healthy Paternal Grandmother   . Healthy Paternal Grandfather    . Healthy Brother    Allergies  Allergen Reactions  . Adhesive [Tape]     Blisters & hives - Paper tape ok  . Latex Itching    Blisters and itching - pt states that it's not Latex but it's adhesives   Prior to Admission medications   Medication Sig Start Date End Date Taking? Authorizing Provider  aspirin 81 MG tablet Take 81 mg by mouth daily.    [provider]  atorvastatin (LIPITOR) 80 MG tablet Take 1 tablet (80 mg total) by mouth daily at 6 PM. 04/20/19   Verta Ellen., NP  Multiple Vitamin (MULTIVITAMIN) tablet Take 1 tablet by mouth daily.    [provider]  Multiple Vitamins-Minerals (PRESERVISION AREDS PO) Take 2 capsules by mouth daily.    [provider]  nitroGLYCERIN (NITROSTAT) 0.4 MG SL tablet Place 1 tablet (0.4 mg total) under the tongue every 5 (five) minutes as needed for chest pain. 04/09/18   Sherren Mocha, MD  Omega-3 Fatty Acids (FISH OIL CONCENTRATE PO) Take 1 capsule by mouth daily.     [provider]  Respiratory Therapy Supplies (FLUTTER) DEVI Use as directed Patient not taking: Reported on 04/23/2019 09/18/18   Martyn Ehrich, NP  vardenafil (LEVITRA) 10 MG tablet Take 10 mg by mouth as needed for erectile dysfunction.     [provider]     Positive ROS: Otherwise negative  All other systems have been reviewed and were otherwise negative with the exception of those mentioned in the HPI and as above.  Physical Exam: Constitutional: Alert, well-appearing, no acute distress Ears: External ears without lesions or tenderness. Ear canals are clear bilaterally with intact, clear TMs.  Nasal: External nose without lesions. Septum midline.. Clear nasal passages.  Both middle meatus regions are clear with no polyps or evidence of acute infection. Oral: Lips and gums without lesions. Tongue and palate mucosa without lesions. Posterior oropharynx clear.  Patient has a 1-1/2 cm right lateral anterior submucosal  tongue lesion.  Fine-needle aspirate was performed in the office today.  Half cc of Xylocaine with epinephrine was injected.  The using a 21-gauge needle needle aspiration was performed.  I thought this might be cystic but apparently it is solid.  Several passes were performed and was applied to slides and placed in alcohol. Neck: No palpable adenopathy or masses Respiratory: Breathing comfortably  Skin: No facial/neck lesions or rash noted.  FNA  Date/Time: 05/25/2019 1:05 PM Performed by: Rozetta Nunnery, MD Authorized by: Rozetta Nunnery, MD   Consent:    Consent obtained:  Verbal   Consent given by:  Patient   Risks discussed:  Bleeding and pain   Alternatives discussed:  No treatment Anesthesia:    Anesthesia method:  Local infiltration   Local anesthetic:  1% Xylocaine with epi   Amount:  0.5 cc Procedure Details:    Needle:  21 G   Lab: Fluid sent to cytology   Post-procedure details:    Patient tolerance of procedure:  Tolerated well, no immediate complications Comments:     Fine-needle aspirate was performed of a right anterior lateral tongue lesion.  Patient tolerated this well.    Assessment: Right lateral anterior tongue lesion questionable etiology.  It has been relatively stable over the past year and is otherwise asymptomatic and is consistent with probable benign lesion.  Plan: Fine-needle aspirate was performed in the office today. Patient will call us back within 3 to 4 days concerning results of the fine-needle aspirate. Briefly discussed the option of removing this under local anesthesia as an outpatient but patient really does not want this removed unless absolutely necessary as it does not give him any problems.   Radene Journey, MD

## 2019-05-26 ENCOUNTER — Other Ambulatory Visit (INDEPENDENT_AMBULATORY_CARE_PROVIDER_SITE_OTHER): Payer: Self-pay

## 2019-05-26 ENCOUNTER — Telehealth: Payer: Self-pay | Admitting: Hematology

## 2019-05-26 ENCOUNTER — Other Ambulatory Visit (HOSPITAL_COMMUNITY)
Admission: RE | Admit: 2019-05-26 | Discharge: 2019-05-26 | Disposition: A | Discharge status: Home / Self Care | Payer: BC Managed Care – PPO | Source: Ambulatory Visit | Attending: Otolaryngology | Admitting: Otolaryngology

## 2019-05-26 DIAGNOSIS — D3702 Neoplasm of uncertain behavior of tongue: Secondary | ICD-10-CM | POA: Insufficient documentation

## 2019-05-26 NOTE — Telephone Encounter (Signed)
Scheduled per 04/12 los, patient has been called and voicemail was left. 

## 2019-05-27 LAB — CYTOLOGY - NON PAP

## 2019-05-31 ENCOUNTER — Telehealth: Payer: Self-pay | Admitting: Otolaryngology

## 2019-05-31 NOTE — Telephone Encounter (Signed)
Patient called today about results of his needle aspiration of the right lateral tongue mass.  Results of the needle biopsy showed no evidence of malignancy.  It apparently was a satisfactory needle biopsy but scant cellularity. Reviewed the pathology report with the patient.  Clinically this is consistent with a benign lesion and would recommend removal of this if it enlarges or gives him trouble clinically.  It had previously been seen on a CT scan over a year ago.

## 2019-06-22 DIAGNOSIS — D485 Neoplasm of uncertain behavior of skin: Secondary | ICD-10-CM | POA: Diagnosis not present

## 2019-06-22 DIAGNOSIS — D225 Melanocytic nevi of trunk: Secondary | ICD-10-CM | POA: Diagnosis not present

## 2019-09-01 ENCOUNTER — Telehealth: Payer: Self-pay | Admitting: Hematology

## 2019-09-01 NOTE — Telephone Encounter (Signed)
Rescheduled appointments per 7/28  patient's request vis scheduling message. Patient is aware of updated appointment date and time.

## 2019-09-22 ENCOUNTER — Encounter: Payer: Self-pay | Admitting: Hematology

## 2019-10-11 IMAGING — DX DG CHEST 2V
2 series · 2 of 2 positions shown · non-contrast
Comparison: Chest CT dated 03/19/2018. Chest radiographs dated
03/06/2018, 07/13/2016 and 02/10/2015..

CLINICAL DATA: Productive cough for the past three weeks. Recent
diagnosis of chronic lymphocytic leukemia.

EXAM:
CHEST - 2 VIEW

[chest pa]
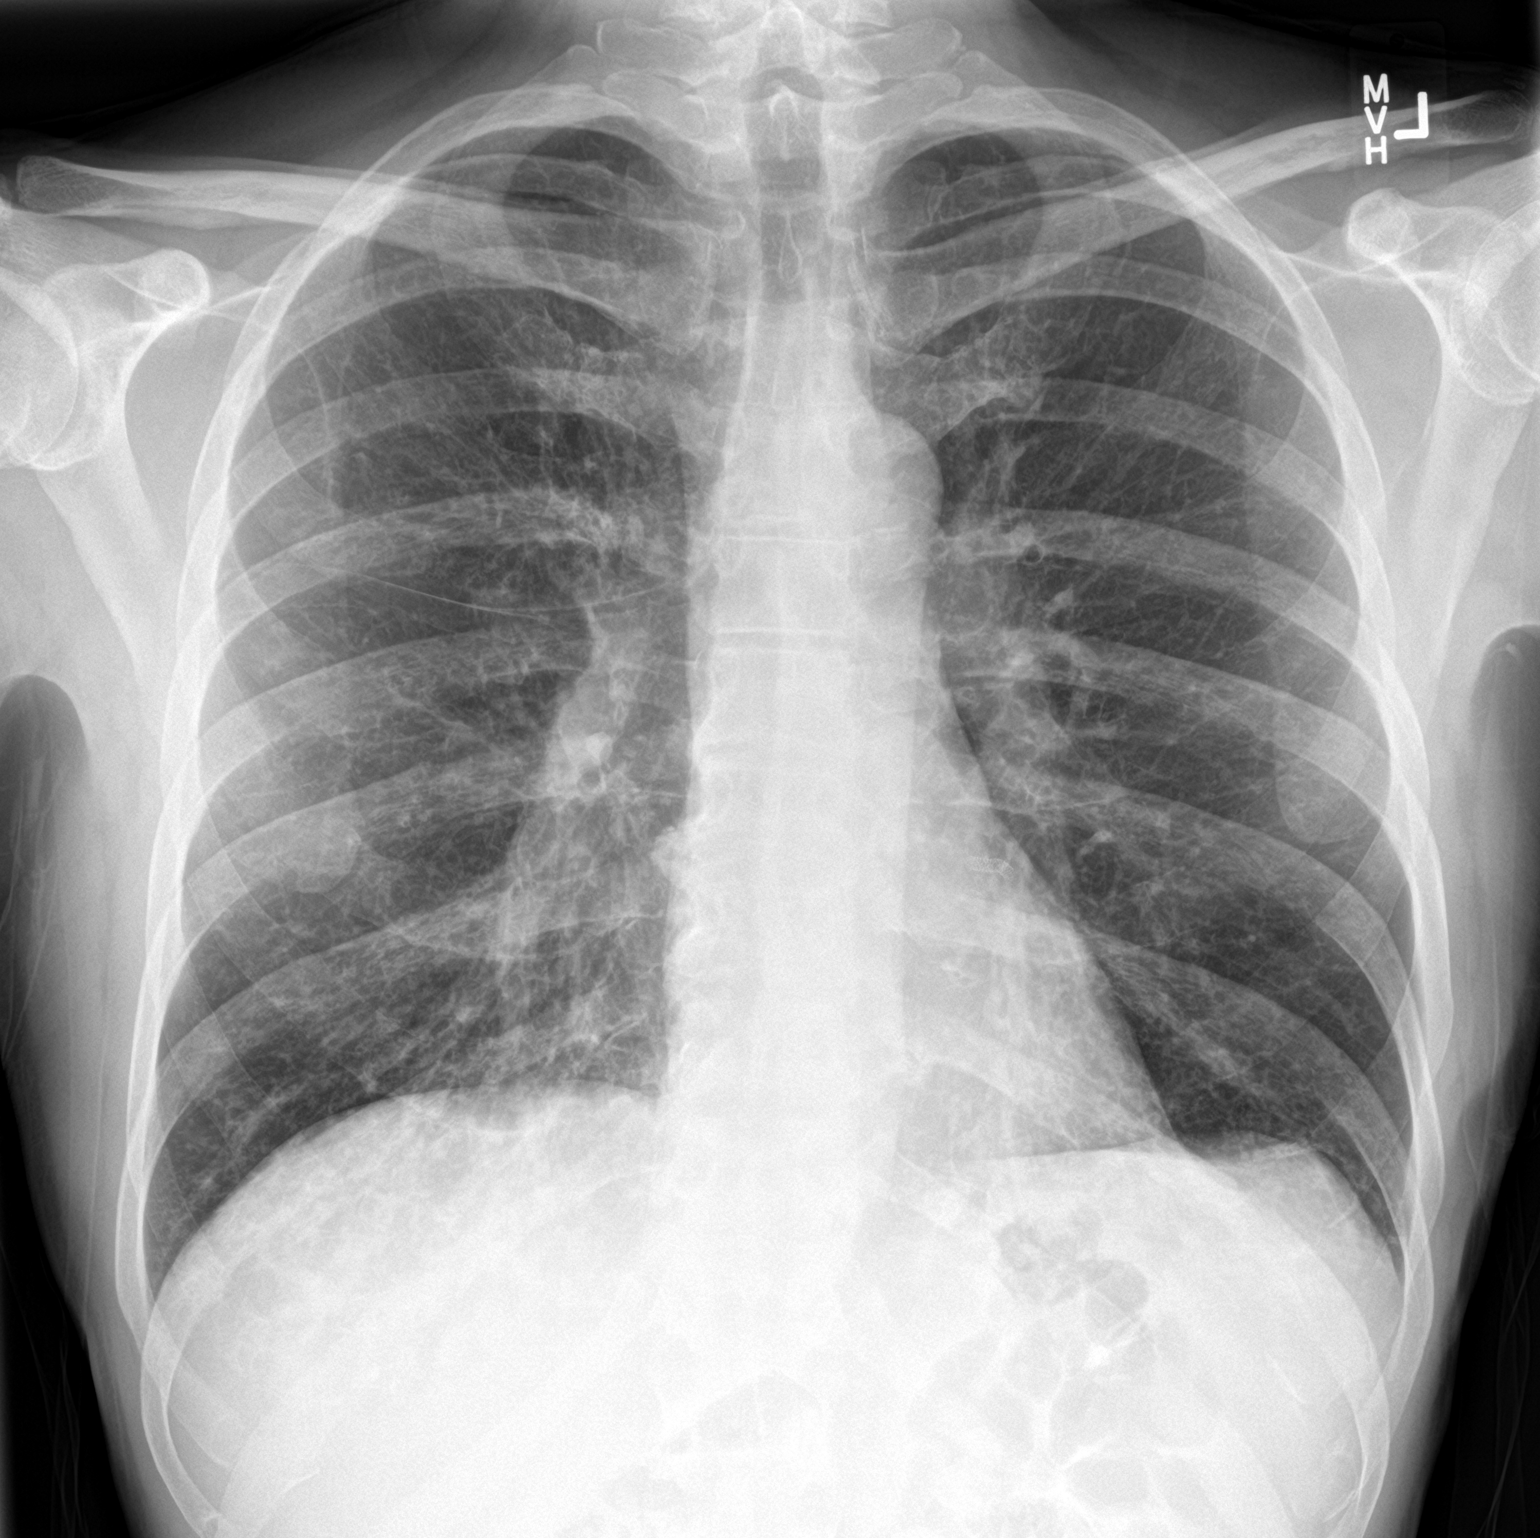

[chest lat]
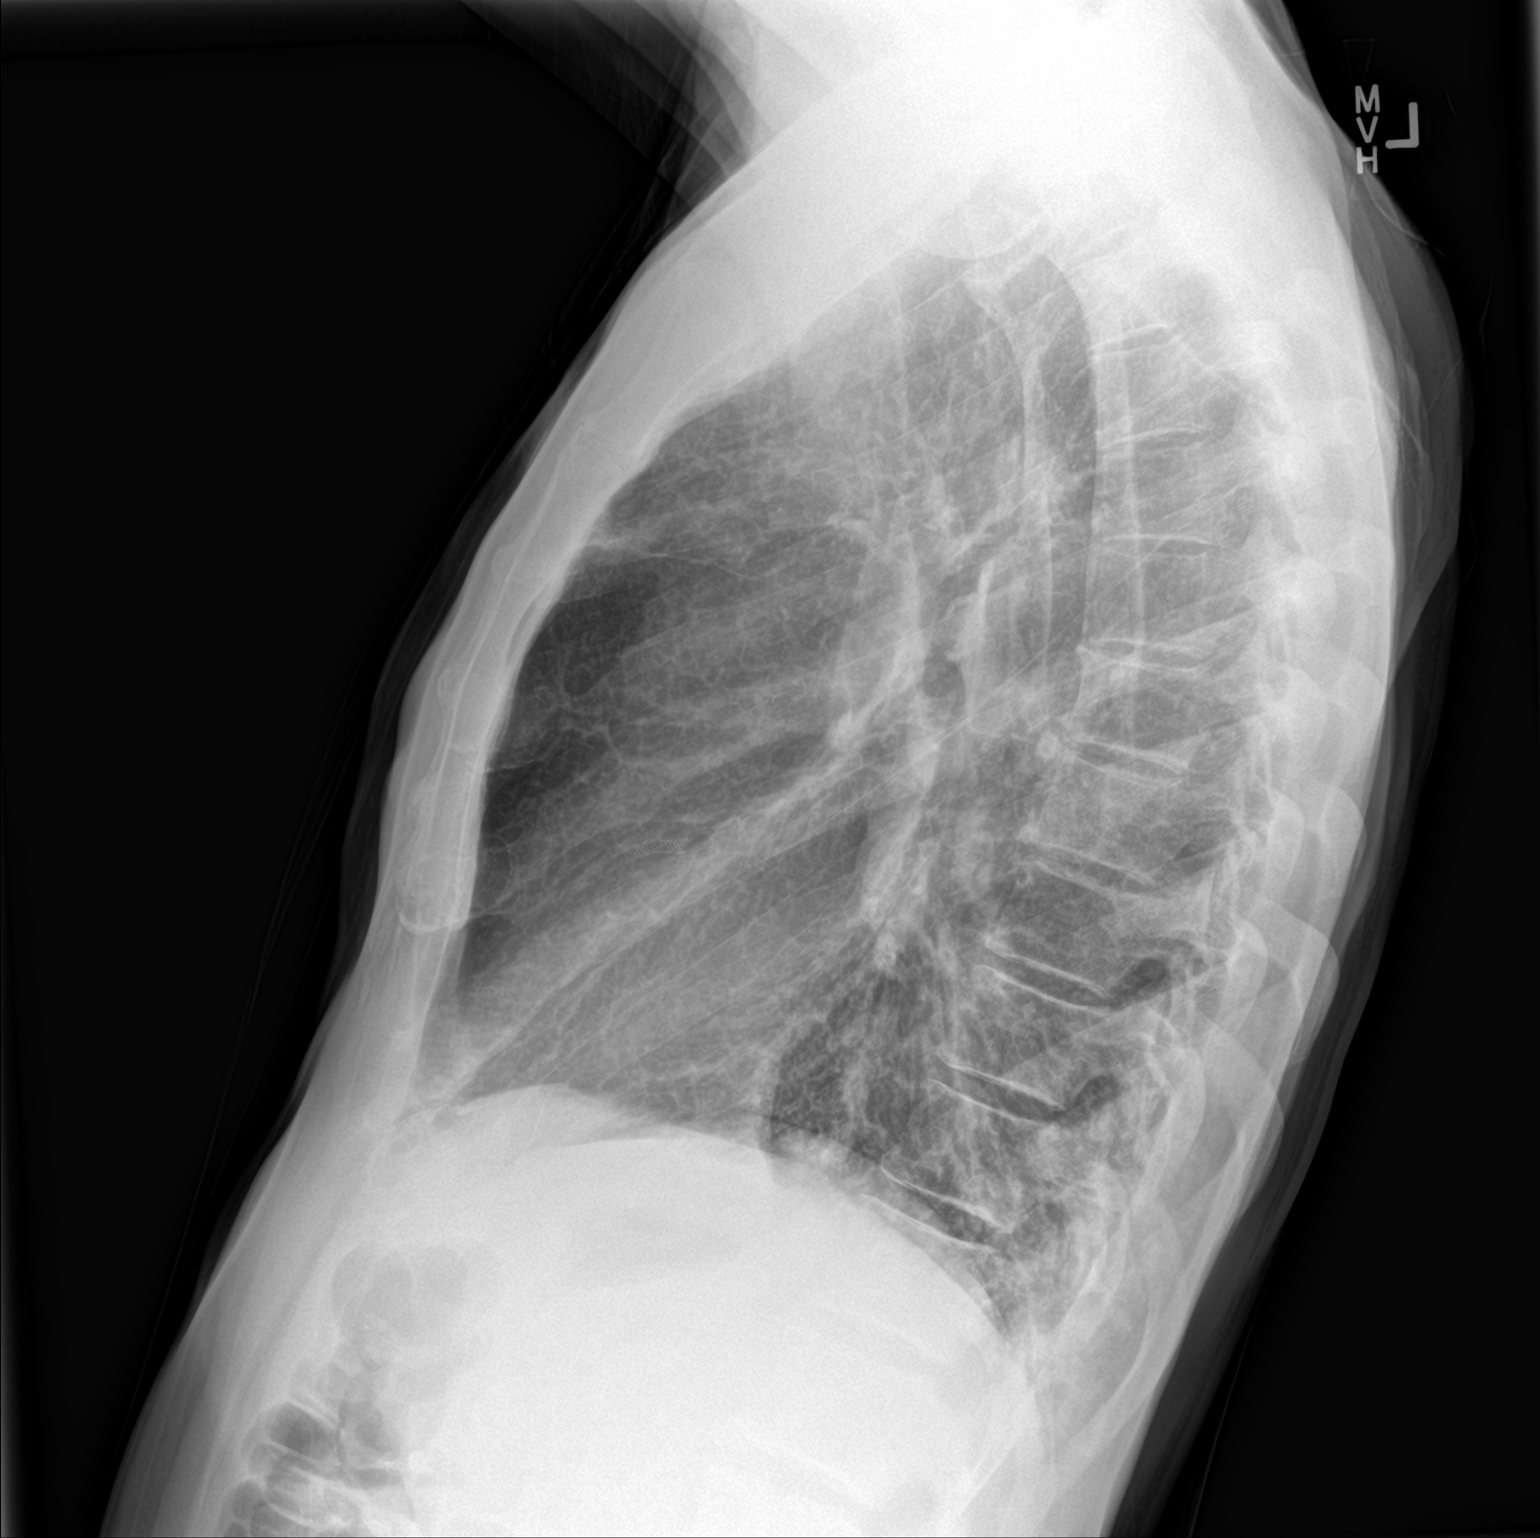

[2 of 2 positions shown; findings below may reference images not displayed]

FINDINGS: Normal sized heart. No significant change in an irregular nodular
density in the medial aspect of the right upper lobe since
03/06/2018, not seen on the examinations prior to that time. Mild
diffuse peribronchial thickening. No airspace consolidation. Lower
thoracic spine degenerative changes.
IMPRESSION: 1. No acute disease.
2. Stable irregular right upper lobe nodule, new since 2713.
Radiographically, this is concerning for a possible small primary
lung carcinoma, less concerning for that on the recent CT. A
follow-up chest CT with contrast is recommended in 6-12 months.
3. Mild bronchitic changes.

These results will be called to the ordering clinician or
representative by the Radiologist Assistant, and communication
documented in the PACS or zVision Dashboard.

## 2019-10-13 DIAGNOSIS — M546 Pain in thoracic spine: Secondary | ICD-10-CM | POA: Diagnosis not present

## 2019-10-14 DIAGNOSIS — M545 Low back pain: Secondary | ICD-10-CM | POA: Diagnosis not present

## 2019-10-14 DIAGNOSIS — M546 Pain in thoracic spine: Secondary | ICD-10-CM | POA: Diagnosis not present

## 2019-11-01 ENCOUNTER — Other Ambulatory Visit: Payer: Self-pay

## 2019-11-01 ENCOUNTER — Inpatient Hospital Stay: Payer: BC Managed Care – PPO | Admitting: Hematology

## 2019-11-01 ENCOUNTER — Inpatient Hospital Stay: Payer: BC Managed Care – PPO | Attending: Hematology

## 2019-11-01 ENCOUNTER — Other Ambulatory Visit: Payer: Self-pay | Admitting: Cardiovascular Disease

## 2019-11-01 VITALS — BP 103/72 | HR 64 | Temp 97.3°F | Resp 18 | Ht 74.0 in | Wt 188.4 lb

## 2019-11-01 DIAGNOSIS — Z23 Encounter for immunization: Secondary | ICD-10-CM | POA: Insufficient documentation

## 2019-11-01 DIAGNOSIS — M25559 Pain in unspecified hip: Secondary | ICD-10-CM | POA: Insufficient documentation

## 2019-11-01 DIAGNOSIS — I700 Atherosclerosis of aorta: Secondary | ICD-10-CM | POA: Diagnosis not present

## 2019-11-01 DIAGNOSIS — Z833 Family history of diabetes mellitus: Secondary | ICD-10-CM | POA: Insufficient documentation

## 2019-11-01 DIAGNOSIS — R5383 Other fatigue: Secondary | ICD-10-CM | POA: Insufficient documentation

## 2019-11-01 DIAGNOSIS — Z79899 Other long term (current) drug therapy: Secondary | ICD-10-CM | POA: Diagnosis not present

## 2019-11-01 DIAGNOSIS — Z8249 Family history of ischemic heart disease and other diseases of the circulatory system: Secondary | ICD-10-CM | POA: Diagnosis not present

## 2019-11-01 DIAGNOSIS — I2510 Atherosclerotic heart disease of native coronary artery without angina pectoris: Secondary | ICD-10-CM | POA: Insufficient documentation

## 2019-11-01 DIAGNOSIS — C9110 Chronic lymphocytic leukemia of B-cell type not having achieved remission: Secondary | ICD-10-CM

## 2019-11-01 DIAGNOSIS — E041 Nontoxic single thyroid nodule: Secondary | ICD-10-CM | POA: Insufficient documentation

## 2019-11-01 LAB — CMP (CANCER CENTER ONLY)
ALT: 39 U/L (ref 0–44)
AST: 22 U/L (ref 15–41)
Albumin: 3.8 g/dL (ref 3.5–5.0)
Alkaline Phosphatase: 71 U/L (ref 38–126)
Anion gap: 5 (ref 5–15)
BUN: 21 mg/dL (ref 8–23)
CO2: 28 mmol/L (ref 22–32)
Calcium: 8.8 mg/dL — ABNORMAL LOW (ref 8.9–10.3)
Chloride: 106 mmol/L (ref 98–111)
Creatinine: 0.85 mg/dL (ref 0.61–1.24)
GFR, Est AFR Am: >60 mL/min (ref >60)
GFR, Estimated: >60 mL/min (ref >60)
Glucose, Bld: 108 mg/dL — ABNORMAL HIGH (ref 70–99)
Potassium: 4.7 mmol/L (ref 3.5–5.1)
Sodium: 139 mmol/L (ref 135–145)
Total Bilirubin: 0.8 mg/dL (ref 0.3–1.2)
Total Protein: 5.9 g/dL — ABNORMAL LOW (ref 6.5–8.1)

## 2019-11-01 LAB — CBC WITH DIFFERENTIAL/PLATELET
Abs Immature Granulocytes: 0.02 10*3/uL (ref 0.00–0.07)
Basophils Absolute: 0 10*3/uL (ref 0.0–0.1)
Basophils Relative: 1 %
Eosinophils Absolute: 0.1 10*3/uL (ref 0.0–0.5)
Eosinophils Relative: 2 %
HCT: 46.9 % (ref 39.0–52.0)
Hemoglobin: 15.5 g/dL (ref 13.0–17.0)
Immature Granulocytes: 0 %
Lymphocytes Relative: 15 %
Lymphs Abs: 0.7 10*3/uL (ref 0.7–4.0)
MCH: 30.1 pg (ref 26.0–34.0)
MCHC: 33 g/dL (ref 30.0–36.0)
MCV: 91.1 fL (ref 80.0–100.0)
Monocytes Absolute: 0.6 10*3/uL (ref 0.1–1.0)
Monocytes Relative: 12 %
Neutro Abs: 3.4 10*3/uL (ref 1.7–7.7)
Neutrophils Relative %: 70 %
Platelets: 144 10*3/uL — ABNORMAL LOW (ref 150–400)
RBC: 5.15 MIL/uL (ref 4.22–5.81)
RDW: 13.1 % (ref 11.5–15.5)
WBC: 4.9 10*3/uL (ref 4.0–10.5)
nRBC: 0 % (ref 0.0–0.2)

## 2019-11-01 LAB — LACTATE DEHYDROGENASE: LDH: 225 U/L — ABNORMAL HIGH (ref 98–192)

## 2019-11-01 MED ORDER — INFLUENZA VAC A&B SA ADJ QUAD 0.5 ML IM PRSY
PREFILLED_SYRINGE | INTRAMUSCULAR | Status: AC
  Filled 2019-11-01: qty 0.5

## 2019-11-01 MED ORDER — INFLUENZA VAC A&B SA ADJ QUAD 0.5 ML IM PRSY
0.5000 mL | PREFILLED_SYRINGE | Freq: Once | INTRAMUSCULAR | Status: AC
  Administered 2019-11-01: 0.5 mL via INTRAMUSCULAR

## 2019-11-01 NOTE — Progress Notes (Signed)
HEMATOLOGY/ONCOLOGY CLINIC NOTE  Date of Service: 11/01/2019  Patient Care Team: [name], MD as PCP - General (Internal Medicine) Sherren Mocha, MD as PCP - Cardiology (Cardiology)  CHIEF COMPLAINTS/PURPOSE OF CONSULTATION:  Chronic Lymphocytic Leukemia  Oncologic History:   Oncology History  Chronic lymphocytic leukemia (CLL), B-cell ([facility])  02/14/2015 Initial Diagnosis   Monoclonal B-cell population CD5 and CD20 positive, differential diagnosis CLL versus mantle cell lymphoma ( patient had absolute lymphocyte count of 8000 in 2010), remained stable.   07/09/2018 -  Chemotherapy   The patient had obinutuzumab (GAZYVA) 100 mg in sodium chloride 0.9 % 100 mL (0.9615 mg/mL) chemo infusion, 100 mg, Intravenous, Once, 2 of 6 cycles Administration: 100 mg (07/09/2018), 900 mg (07/10/2018), 1,000 mg (07/31/2018)  for chemotherapy treatment.       HISTORY OF PRESENTING ILLNESS:   Jake Kennedy is a wonderful 69 y.o. male who has been referred to [place] by Dr. [name] for evaluation and management of Chronic Lymphocytic Leukemia. The pt was formerly under the care of my colleague Dr. Nicholas Lose. He is accompanied today by his wife. The pt reports that he is doing well overall.  The pt reports that his course of CLL had been pretty consistent since diagnosis in 2017, until roughly 3 months ago in roughly November 2019. He notes that he has had recent increase in fatigue in the last 3 months, and more acutely in the last 3 weeks. He endorses occasional fevers over the last two weeks during the night, highest at 100.2 or 100.3. He also endorses some night sweats, not every night, some of these being "drenching." He notes that he has been eating well for the last 6 months, however has lost 8 pounds in that interim. He endorses regular exercising until the past month, due to his worsened fatigue, and had previously regularly run upwards of 3 miles and bike for several miles as well.  The pt  notes that he can't smell very well, in the last 2-3 weeks, and his hearing has also reduced in the last week. He senses that his enlarged lymph nodes are pressing on his neck, and characterizes this as similar to when one has an acute cold and congestion. He had acute pain in his right hip a week ago and saw my colleague Sandi Mealy, PA in Symptom Managment. He was given Norco but denies taking this because he didn't want to. He endorses a positional element to his hip pain, which e attributes to the pain being from an enlarged lymph node. He notes that his hip pain had sudden onset on the night of 03/18/18. He notes that his neck lymph nodes have grown in the last 3 weeks to a month. The pt notes that his lymph nodes in his neck, armpits, and groin have been enlarged.  The pt tried an antibiotic course 2 weeks ago with his PCP for a suspected sinus infection, during which his cervical lymph nodes enlarged further. He notes that his neck lymph nodes are somewhat improved today as compared to this. The pt denies frequent infections.   Of note prior to the patient's visit today, pt has had CT N/C/A/P completed on 03/19/18 with results revealing NECK: Multiple enlarged bilateral cervical lymph nodes consistent with the given diagnosis of CLL. The largest node is a right level 3 lymph node measuring 3.4 x 2.8 x 1.2 cm. 2. Degenerative changes of the cervical spine. 3. Hypodense lesion in the right side of the  tongue measuring up to 1.5 cm. This could represent focal infection. Neoplasm is considered less likely. The lesion appears to be superficial along the right lateral aspect of the anterior tongue a. This should be amenable to direct inspection, C/A/P: Marked adenopathy within the chest, abdomen, and pelvis, consistent with active lymphoma/leukemia. 2. Splenomegaly is nonspecific. Splenic involvement cannot be excluded. 3. Right greater than left and basilar predominant peribronchovascular interstitial thickening  and nodularity. Considerations include pulmonary involvement of lymphoma and/or Infection/aspiration. 4. Coronary artery atherosclerosis. Aortic Atherosclerosis. 5. Trace nonspecific pelvic fluid.  Most recent lab results (03/17/18) of CBC w/diff and CMP is as follows: all values are WNL except for WBC at 13.1k, RBC at 2.67, HGB at 10.1, HCT at 31.0, MCV at 116.1, MCH at 37.8, PLT at 103k, ANC at 900, Lymphs abs at 11.1k, BUN at 31, Calcium at 8.7, Total Protein at 6.2, Albumin at 3.4. 03/17/18 LDH at 241  On review of systems, pt reports new fatigue, recent fevers, recent night sweats, recently grown neck lymph nodes, eating well, some weight loss, right groin pain, and denies chills, SOB, ear pain, pain along the spine, frequent infections, flank pain, leg swelling, testicular pain or swelling, and any other symptoms.   Interval History:   Jake Kennedy returns today for management and evaluation of his CLL. The patient's last visit with [place] was on 05/17/2019. The pt reports that he is doing well overall.  The pt reports that he received his COVID19 booster about 4 weeks ago. He denies any consitutional symptoms. Pt continues to exercise multiple times per week.   Pt had an evaluation of his right tongue mass with Dr. Lucia Gaskins, which was found to be benign. He has not had a follow up with Pulmonology since our last visit.   Lab results today (11/01/19) of CBC w/diff and CMP is as follows: all values are WNL except for PLT at 144K, Glucose at 108, Calcium at 8.8, Total Protein at 5.9. 11/01/2019 LDH at 225  On review of systems, pt denies fevers, chills, night sweats, SOB, unexpected weight loss, new lumps/bumps, leg swelling, rash, testicular swelling and any other symptoms.    MEDICAL HISTORY:  Past Medical History:  Diagnosis Date  . Basal cell carcinoma    moles removed in 2007 and 2012 from L nose and L hip  . BPH (benign prostatic hypertrophy)   . CAD (coronary artery disease), native  coronary artery    a. 02/10/2015 95% mid LAD stenosis s/p DES  b. 07/15/16: LHC showed stable non obst dz and patent stent  . CLL (chronic lymphocytic leukemia) ([facility])   . Coronary artery disease   . ED (erectile dysfunction)   . Lymphocytosis 02/11/2015   seen by Dr. Lindi Adie 02/11/2015, working up for possible CLL    SURGICAL HISTORY: Past Surgical History:  Procedure Laterality Date  . CARDIAC CATHETERIZATION  02/10/2015  . CARDIAC CATHETERIZATION N/A 02/10/2015   Procedure: Left Heart Cath and Coronary Angiography;  Surgeon: Sherren Mocha, MD;  Location: [facility];  Service: Cardiovascular;  Laterality: N/A;  . CARDIAC CATHETERIZATION N/A 02/10/2015   Procedure: Coronary Stent Intervention;  Surgeon: Sherren Mocha, MD;  Location: [facility];  Service: Cardiovascular;  Laterality: N/A;  . CORONARY STENT PLACEMENT  02/10/2015   DES to LAD  . LAMINECTOMY    . LEFT HEART CATH AND CORONARY ANGIOGRAPHY N/A 07/15/2016   Procedure: Left Heart Cath and Coronary Angiography;  Surgeon: Martinique, Peter M, MD;  Location:  [facility];  Service: Cardiovascular;  Laterality: N/A;  . TONSILLECTOMY    . VIDEO BRONCHOSCOPY WITH ENDOBRONCHIAL NAVIGATION N/A 09/16/2018   Procedure: VIDEO BRONCHOSCOPY WITH ENDOBRONCHIAL NAVIGATION AND BIOPIES;  Surgeon: Collene Gobble, MD;  Location: [facility];  Service: Thoracic;  Laterality: N/A;    SOCIAL HISTORY: Social History   Socioeconomic History  . Marital status: Married    Spouse name: Not on file  . Number of children: Not on file  . Years of education: Not on file  . Highest education level: Not on file  Occupational History  . Not on file  Tobacco Use  . Smoking status: Never Smoker  . Smokeless tobacco: Never Used  Vaping Use  . Vaping Use: Never used  Substance and Sexual Activity  . Alcohol use: Yes    Alcohol/week: 0.0 standard drinks    Comment: occasional  . Drug use: No  . Sexual activity: Not on file  Other Topics Concern   . Not on file  Social History Narrative  . Not on file   Social Determinants of Health   Financial Resource Strain:   . Difficulty of Paying Living Expenses: Not on file  Food Insecurity:   . Worried About Charity fundraiser in the Last Year: Not on file  . Ran Out of Food in the Last Year: Not on file  Transportation Needs:   . Lack of Transportation (Medical): Not on file  . Lack of Transportation (Non-Medical): Not on file  Physical Activity:   . Days of Exercise per Week: Not on file  . Minutes of Exercise per Session: Not on file  Stress:   . Feeling of Stress : Not on file  Social Connections:   . Frequency of Communication with Friends and Family: Not on file  . Frequency of Social Gatherings with Friends and Family: Not on file  . Attends Religious Services: Not on file  . Active Member of Clubs or Organizations: Not on file  . Attends Archivist Meetings: Not on file  . Marital Status: Not on file  Intimate Partner Violence:   . Fear of Current or Ex-Partner: Not on file  . Emotionally Abused: Not on file  . Physically Abused: Not on file  . Sexually Abused: Not on file    FAMILY HISTORY: Family History  Problem Relation Age of Onset  . Diabetes Mother   . Heart failure Mother   . Heart attack Mother        occured in 12s.   . Healthy Sister   . Healthy Brother   . Healthy Maternal Grandmother   . Healthy Maternal Grandfather   . Healthy Paternal Grandmother   . Healthy Paternal Grandfather   . Healthy Brother     ALLERGIES:  is allergic to adhesive [tape].  MEDICATIONS:  Current Outpatient Medications  Medication Sig Dispense Refill  . aspirin 81 MG tablet Take 81 mg by mouth daily.    Marland Kitchen atorvastatin (LIPITOR) 80 MG tablet Take 1 tablet (80 mg total) by mouth daily at 6 PM. 90 tablet 3  . Multiple Vitamin (MULTIVITAMIN) tablet Take 1 tablet by mouth daily.    . Multiple Vitamins-Minerals (PRESERVISION AREDS PO) Take 2 capsules by mouth  daily.    . nitroGLYCERIN (NITROSTAT) 0.4 MG SL tablet PLACE 1 TABLET UNDER THE TONGUE EVERY 5 MINUTES AS NEEDED FOR CHEST PAIN 25 tablet 3  . Omega-3 Fatty Acids (FISH OIL CONCENTRATE PO) Take 1 capsule by  mouth daily.     Marland Kitchen Respiratory Therapy Supplies (FLUTTER) DEVI Use as directed (Patient not taking: Reported on 04/23/2019) 1 each 0  . vardenafil (LEVITRA) 10 MG tablet Take 10 mg by mouth as needed for erectile dysfunction.      No current facility-administered medications for this visit.    REVIEW OF SYSTEMS:   A 10+ POINT REVIEW OF SYSTEMS WAS OBTAINED including neurology, dermatology, psychiatry, cardiac, respiratory, lymph, extremities, GI, GU, Musculoskeletal, constitutional, breasts, reproductive, HEENT.  All pertinent positives are noted in the HPI.  All others are negative.   PHYSICAL EXAMINATION: ECOG FS:1 - Symptomatic but completely ambulatory  Vitals:   11/01/19 1335  BP: 103/72  Pulse: 64  Resp: 18  Temp: (!) 97.3 F (36.3 C)  SpO2: 99%   Wt Readings from Last 3 Encounters:  11/01/19 188 lb 6.4 oz (85.5 kg)  05/17/19 190 lb 1.6 oz (86.2 kg)  04/23/19 196 lb 9.6 oz (89.2 kg)   Body mass index is 24.19 kg/m.    GENERAL:alert, in no acute distress and comfortable SKIN: no acute rashes, no significant lesions EYES: conjunctiva are pink and non-injected, sclera anicteric OROPHARYNX: MMM, no exudates, no oropharyngeal erythema or ulceration, sizeable nodule on the right side of tongue. NECK: supple, no JVD LYMPH:  no palpable lymphadenopathy in the cervical, axillary or inguinal regions LUNGS: clear to auscultation b/l with normal respiratory effort HEART: regular rate & rhythm ABDOMEN:  normoactive bowel sounds , non tender, not distended. No palpable hepatosplenomegaly.  Extremity: no pedal edema PSYCH: alert & oriented x 3 with fluent speech NEURO: no focal motor/sensory deficits  LABORATORY DATA:  I have reviewed the data as listed  . CBC Latest Ref Rng  & Units 11/01/2019 05/17/2019 01/15/2019  WBC 4.0 - 10.5 K/uL 4.9 4.7 3.7(L)  Hemoglobin 13.0 - 17.0 g/dL 15.5 16.8 16.3  Hematocrit 39 - 52 % 46.9 50.9 50.6  Platelets 150 - 400 K/uL 144(L) 157 178  ANC 700<---600<---1200  . CMP Latest Ref Rng & Units 11/01/2019 05/17/2019 01/15/2019  Glucose 70 - 99 mg/dL 108(H) 138(H) 81  BUN 8 - 23 mg/dL 21 22 23   Creatinine 0.61 - 1.24 mg/dL 0.85 1.03 0.92  Sodium 135 - 145 mmol/L 139 141 141  Potassium 3.5 - 5.1 mmol/L 4.7 4.7 4.3  Chloride 98 - 111 mmol/L 106 103 103  CO2 22 - 32 mmol/L 28 28 32  Calcium 8.9 - 10.3 mg/dL 8.8(L) 9.0 8.8(L)  Total Protein 6.5 - 8.1 g/dL 5.9(L) 6.5 6.2(L)  Total Bilirubin 0.3 - 1.2 mg/dL 0.8 0.9 0.7  Alkaline Phos 38 - 126 U/L 71 90 95  AST 15 - 41 U/L 22 21 19   ALT 0 - 44 U/L 39 36 32  01/04/2019 [place] TYROID (Accession [id number])    04/30/18 Outside Labs:                RADIOGRAPHIC STUDIES: I have personally reviewed the radiological images as listed and agreed with the findings in the report. No results found.  ASSESSMENT & PLAN:  69 y.o. male with  1. Chronic Lymphocytic Leukemia Initial diagnosis on 02/13/15 Peripheral blood flow cytometry which revealed a Monoclonal B-cell population 09/25/15 Cytogenetics ruled out Mantle cell lymphoma. 12/25/17 FISH study ruled out translocation 11;14 12/30/17 BM Biopsy revealed CLL without evidence of transformation  03/17/18 FISH CLL Prognostic panel revealed an 11q deletion and Trisomy 12  03/19/18 CT N/C/A/P revealed NECK: Multiple enlarged bilateral cervical lymph nodes consistent with the given  diagnosis of CLL. The largest node is a right level 3 lymph node measuring 3.4 x 2.8 x 1.2 cm. 2. Degenerative changes of the cervical spine. 3. Hypodense lesion in the right side of the tongue measuring up to 1.5 cm. This could represent focal infection. Neoplasm is considered less likely. The lesion appears to be superficial along the right lateral aspect of the  anterior tongue a. This should be amenable to direct inspection, C/A/P: Marked adenopathy within the chest, abdomen, and pelvis, consistent with active lymphoma/leukemia. 2. Splenomegaly is nonspecific. Splenic involvement cannot be excluded. 3. Right greater than left and basilar predominant peribronchovascular interstitial thickening and nodularity. Considerations include pulmonary involvement of lymphoma and/or Infection/aspiration. 4. Coronary artery atherosclerosis. Aortic Atherosclerosis. 5. Trace nonspecific pelvic fluid.  03/26/18 Left cervical LN biopsy revealed SLL/CLL  08/11/18 CT Neck revealed "Decreased cervical lymphadenopathy with all remaining nodes being subcentimeter in short axis. 2. Sinusitis."  08/11/18 CT Chest revealed "Interval improvement without resolution of the basilar predominant peripheral peribronchovascular nodularity. 2. Interval development of a new masslike consolidative opacity in the medial right lung measuring up to 3.3 cm. This may be infectious/inflammatory. Lymphoma involvement not excluded. 3. Interval resolution of splenomegaly with small volume perisplenic ascites identified today. 4.  Aortic Atherosclerosis."  04/16/19 CT Chest ([id number]) revealed "1. Overall today's study demonstrates improved appearance of the lungs with resolution of widespread thickening of the peribronchovascular interstitium as well as scattered areas of peribronchovascular micronodularity seen throughout the lungs bilaterally. Mild cylindrical bronchiectasis is again noted. 2. Stable 9 x 8 mm pulmonary nodule in the left lower lobe (axial image 126 of series 3), strongly favored to be benign."  #2 Thyroid nodule- appears benign on Ultrasound. 10/09/2018 US THYROID (Accession [id number]) revealed "1. Solitary 1.2 cm benign colloid containing cyst within the left lobe of the thyroid does not meet imaging criteria to recommend percutaneous sampling or continued dedicated follow-up. 2.  Otherwise, normal thyroid ultrasound."   PLAN: -Discussed pt labwork today, 11/01/19; blood counts and chemistries are stable, LDH is borderline elevated.  -No lab or clinical evidence of CLL progression/recurrence at this time.  -No indication to treat. Will continue watchful observation every 6 months. -Recommend pt f/u with Pulmonology for lung lesion monitoring -Will see back in 6 months with labs   FOLLOW UP: RTC with Dr Irene Limbo with labs in 6 months   The total time spent in the appt was 20 minutes and more than 50% was on counseling and direct patient cares.  All of the patient's questions were answered with apparent satisfaction. The patient knows to call the clinic with any problems, questions or concerns.    Sullivan Lone MD [place] AAHIVMS [facility] Hematology/Oncology Physician [facility]  (Office):       [phone] (Work cell):  [phone] (Fax):           [phone]  11/01/2019 4:46 PM  I, Yevette Edwards, am acting as a scribe for Dr. Sullivan Lone.   .I have reviewed the above documentation for accuracy and completeness, and I agree with the above. Brunetta Genera MD

## 2019-11-02 ENCOUNTER — Ambulatory Visit: Payer: BC Managed Care – PPO | Admitting: Hematology

## 2019-11-02 ENCOUNTER — Other Ambulatory Visit: Payer: BC Managed Care – PPO

## 2019-11-02 LAB — COLOGUARD

## 2019-11-04 ENCOUNTER — Encounter: Payer: Self-pay | Admitting: Hematology

## 2019-11-08 ENCOUNTER — Telehealth: Payer: Self-pay | Admitting: Hematology

## 2019-11-08 DIAGNOSIS — Z1212 Encounter for screening for malignant neoplasm of rectum: Secondary | ICD-10-CM | POA: Diagnosis not present

## 2019-11-08 DIAGNOSIS — Z1211 Encounter for screening for malignant neoplasm of colon: Secondary | ICD-10-CM | POA: Diagnosis not present

## 2019-11-08 NOTE — Telephone Encounter (Signed)
Scheduled appt per 10/3 sch msg- mailed reminder letter with appt date and time

## 2019-11-14 LAB — COLOGUARD: COLOGUARD: NEGATIVE

## 2019-11-17 ENCOUNTER — Ambulatory Visit: Payer: BC Managed Care – PPO | Admitting: Hematology

## 2019-11-17 ENCOUNTER — Other Ambulatory Visit: Payer: BC Managed Care – PPO

## 2019-12-13 DIAGNOSIS — Z85828 Personal history of other malignant neoplasm of skin: Secondary | ICD-10-CM | POA: Diagnosis not present

## 2019-12-13 DIAGNOSIS — D2262 Melanocytic nevi of left upper limb, including shoulder: Secondary | ICD-10-CM | POA: Diagnosis not present

## 2019-12-13 DIAGNOSIS — D2261 Melanocytic nevi of right upper limb, including shoulder: Secondary | ICD-10-CM | POA: Diagnosis not present

## 2019-12-13 DIAGNOSIS — D225 Melanocytic nevi of trunk: Secondary | ICD-10-CM | POA: Diagnosis not present

## 2019-12-13 DIAGNOSIS — C44619 Basal cell carcinoma of skin of left upper limb, including shoulder: Secondary | ICD-10-CM | POA: Diagnosis not present

## 2019-12-13 DIAGNOSIS — L570 Actinic keratosis: Secondary | ICD-10-CM | POA: Diagnosis not present

## 2020-03-21 ENCOUNTER — Encounter: Payer: Self-pay | Admitting: Hematology

## 2020-03-23 NOTE — Telephone Encounter (Signed)
Please let him know that the important issue is his CLL. If we believe that his CLL is in remission, then he may still be protected. I don't think he needs to get the 2nd booster now due to immunosuppression.   Would wait until the next recommendations are released as to when we should all get shot #4. That information will probably depend on how far omicron goes, and when the CDC believes protection will be most valuable (ie in the Fall months).

## 2020-03-28 DIAGNOSIS — H43811 Vitreous degeneration, right eye: Secondary | ICD-10-CM | POA: Diagnosis not present

## 2020-03-28 DIAGNOSIS — H353131 Nonexudative age-related macular degeneration, bilateral, early dry stage: Secondary | ICD-10-CM | POA: Diagnosis not present

## 2020-03-28 DIAGNOSIS — H2513 Age-related nuclear cataract, bilateral: Secondary | ICD-10-CM | POA: Diagnosis not present

## 2020-04-12 IMAGING — CT CT CHEST W/O CM
2 of 3 series · 15 of 36 positions shown, 18 images · non-contrast
Comparison: 08/11/2018

CLINICAL DATA: Follow-up lung nodule. History of chronic
lymphocytic leukemia.

EXAM:
CT CHEST WITHOUT CONTRAST
TECHNIQUE: Multidetector CT imaging of the chest was performed following the
standard protocol without IV contrast.

[Series 2: thorax · axial · 0.75mm/px · z∈[-257,+21]mm · 12 of 165 slices shown, 15 images]
[im 13/165  mediastinal]
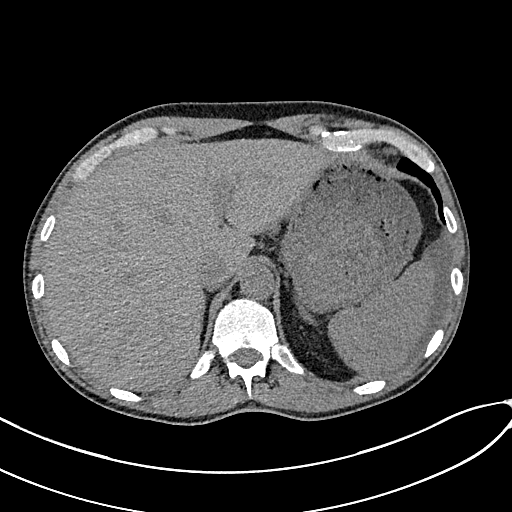
[im 13/165  lung]
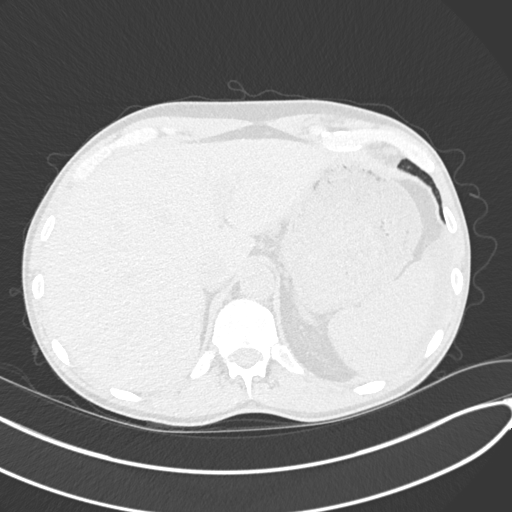
[im 25/165  lung]
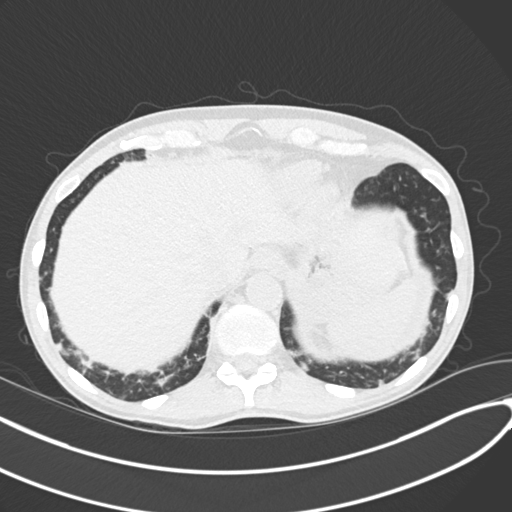
[im 37/165  lung]
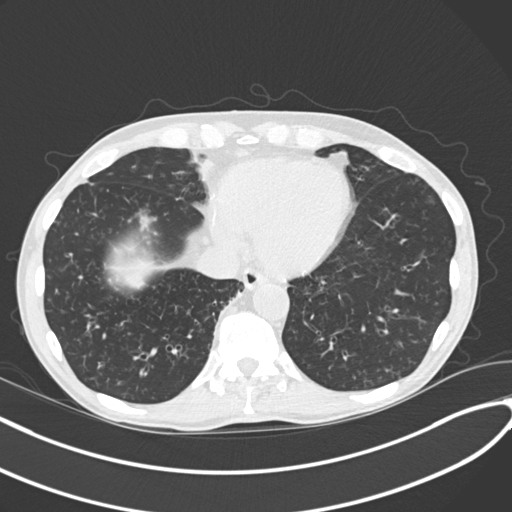
[im 49/165  lung]
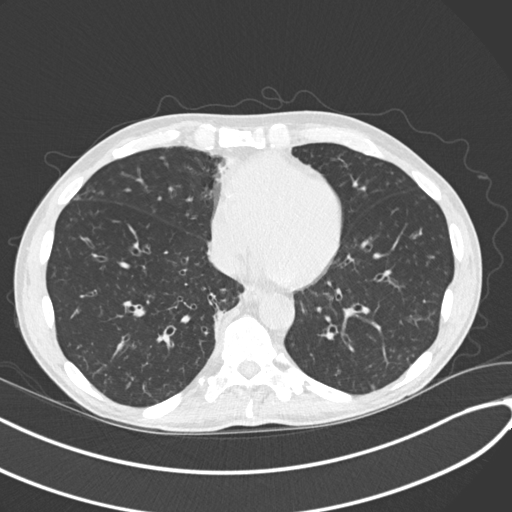
[im 61/165  mediastinal]
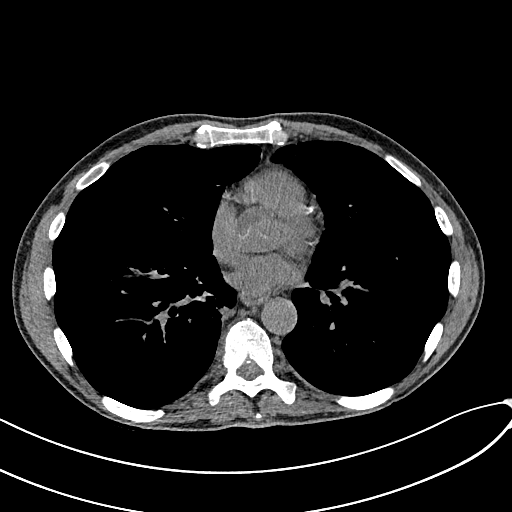
[im 61/165  lung]
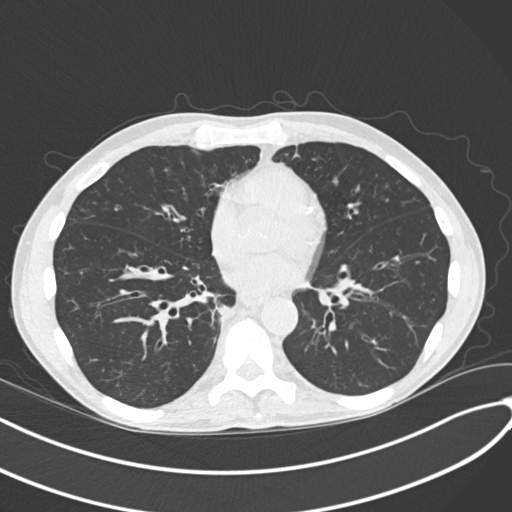
[im 73/165  lung]
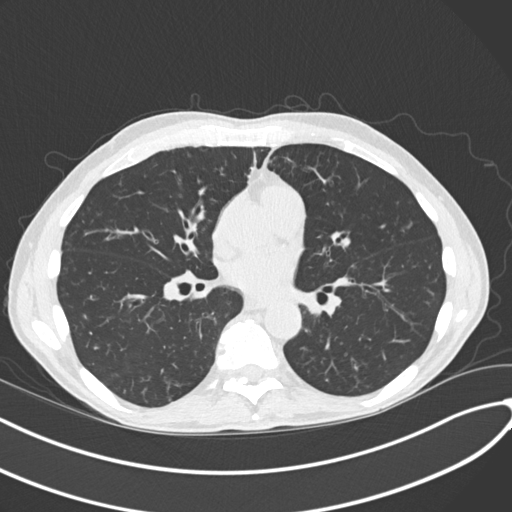
[im 92/165  lung]
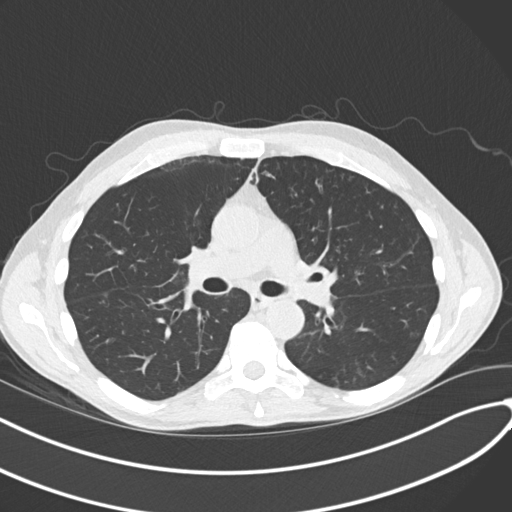
[im 104/165  lung]
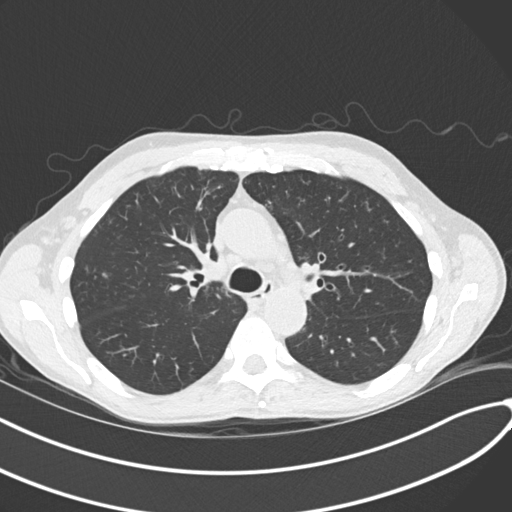
[im 116/165  mediastinal]
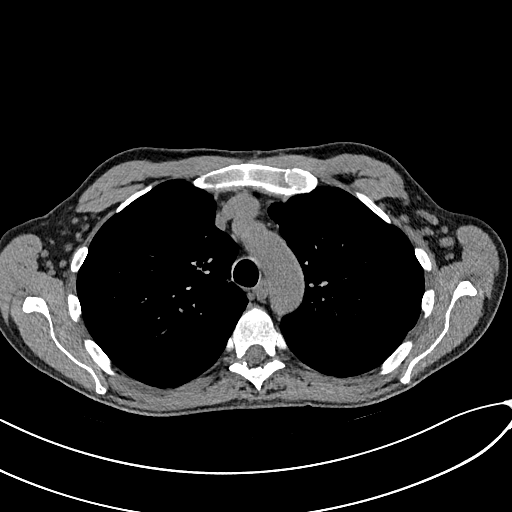
[im 116/165  lung]
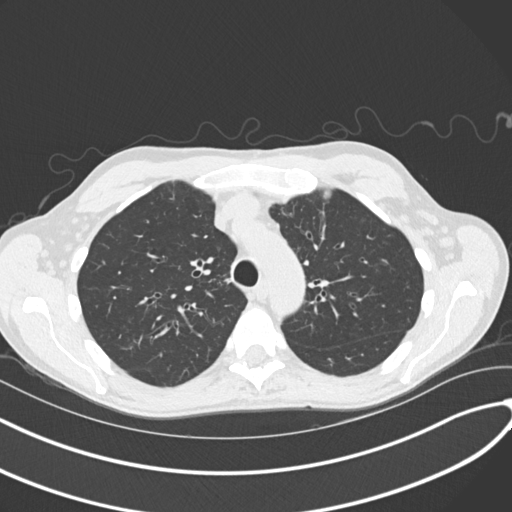
[im 128/165  lung]
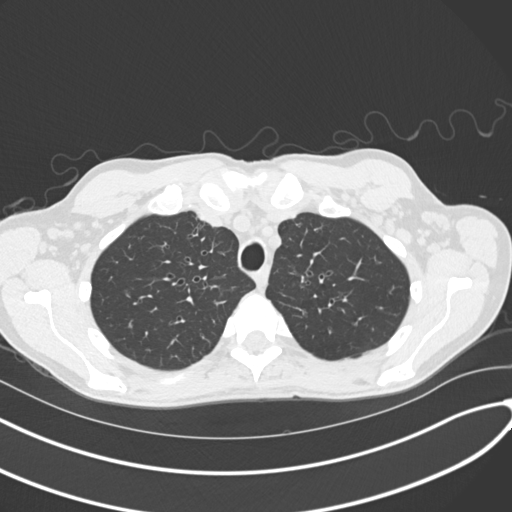
[im 140/165  lung]
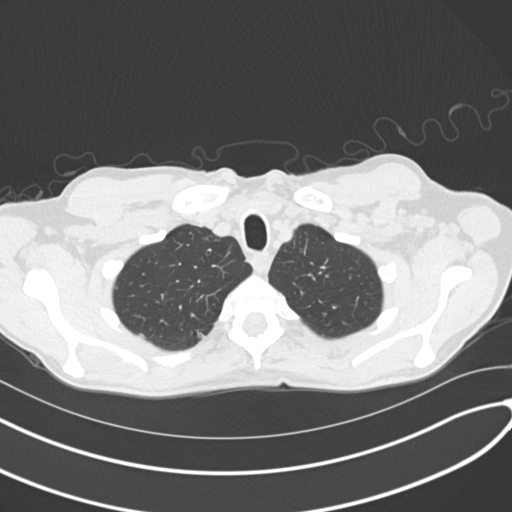
[im 152/165  lung]
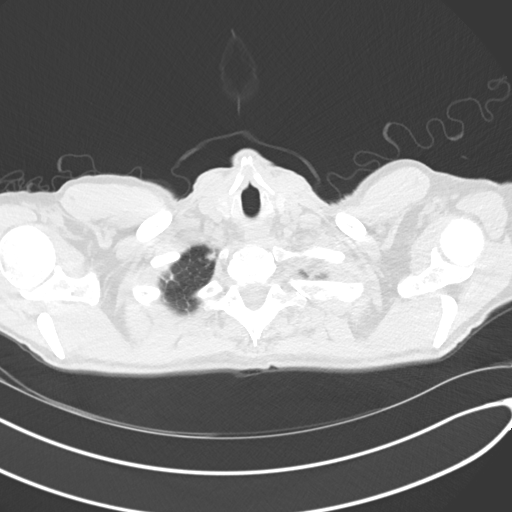

[Series 5: coronal · coronal · 0.64mm/px · 3 of 124 slices shown]
[im 25/124  lung]
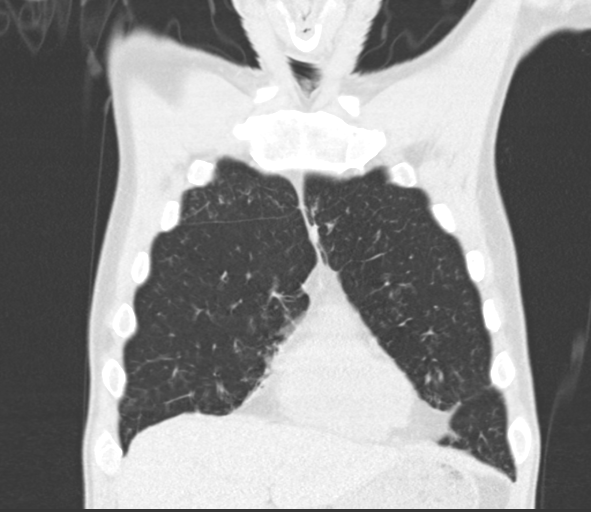
[im 50/124  lung]
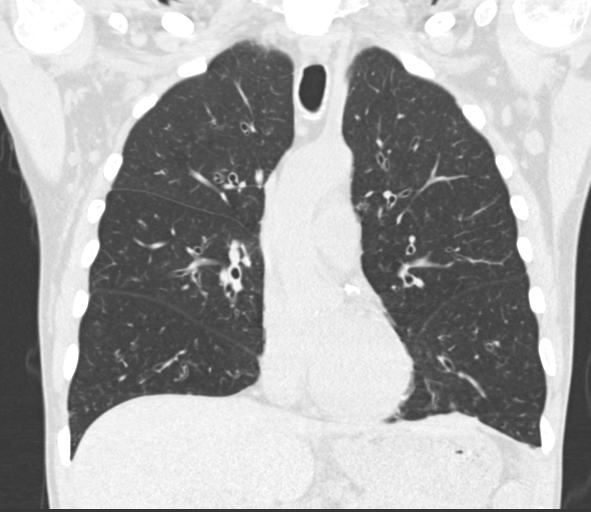
[im 74/124  lung]
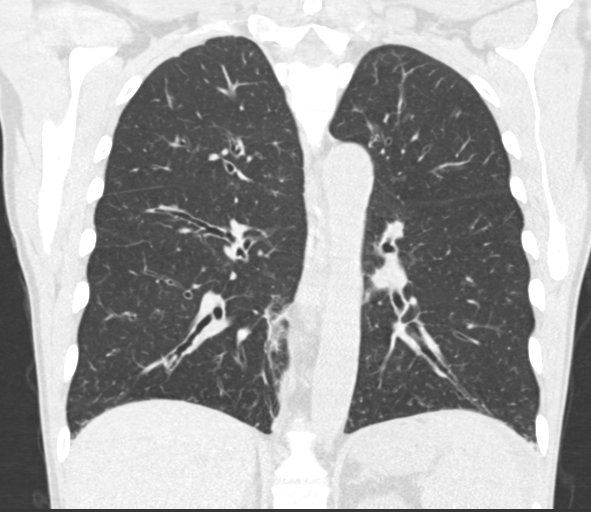

[15 of 36 positions shown; findings below may reference images not displayed]

FINDINGS: Cardiovascular: Normal heart size. No pericardial effusion. Aortic
atherosclerosis. RCA, LAD and left circumflex coronary artery
calcifications.

Mediastinum/Nodes: Bilateral low-attenuation thyroid nodules. The
trachea appears patent and is midline. Normal appearance of the
esophagus. Prominent axillary lymph nodes are again identified in
keeping with the history of chronic lymphocytic leukemia. On today's
exam these appear less conspicuous than on 08/11/2018. For example,
left axillary lymph node measures 1.1 cm, image 35/2. Previously
cm. Adjacent node measures 0.8 cm, image 48/2. Previously 0.9 cm. No
mediastinal or hilar adenopathy identified.

Lungs/Pleura: There is diffuse bronchial wall thickening and
bronchiectasis. No pleural effusion, or atelectasis. No
pneumothorax. The previously identified subpleural nodule within the
paraspinal right lower lobe is decreased in size from previous exam
compatible with resolving inflammatory/infectious process. On
today's exam this measures 0.7 x 0.8 cm, image 106/3. Previously
x 1.9 cm. In the left lung base there is a persistent nodular
density which measures 0.8 x 0.9 cm, image 134/3. This is decreased
from 1.9 cm on 03/19/2018. Several new areas of subpleural nodular
thickening within the posterior lung bases are identified which are
likely post infectious or inflammatory in etiology.

Upper Abdomen: Calcified granulomas identified within the liver.
Peri splenic fluid is again noted. No acute abnormality.

Musculoskeletal: No chest wall mass or suspicious bone lesions
identified.
IMPRESSION: 1. The index subpleural nodule within the paraspinal right lower
lobe exhibits significant decrease in size from previous exam
compatible with resolving inflammatory/infectious process.
Subpleural nodular density within the left base is decreased in size
from 03/19/2018. Also likely reflecting postinflammatory or
infectious process.
2. Scattered peripheral subpleural nodular densities within both
lung bases are noted, likely post infectious or inflammatory.
3. Bronchiectasis, bronchial wall thickening and peribronchovascular
interstitial thickening is again noted.
4. Borderline axillary adenopathy is stable to improved in the
interval.
5. Aortic Atherosclerosis ([id number]). Coronary artery
calcifications.

## 2020-04-14 ENCOUNTER — Other Ambulatory Visit: Payer: Self-pay | Admitting: Family Medicine

## 2020-04-14 DIAGNOSIS — I2510 Atherosclerotic heart disease of native coronary artery without angina pectoris: Secondary | ICD-10-CM

## 2020-04-19 DIAGNOSIS — N400 Enlarged prostate without lower urinary tract symptoms: Secondary | ICD-10-CM | POA: Diagnosis not present

## 2020-04-19 DIAGNOSIS — N529 Male erectile dysfunction, unspecified: Secondary | ICD-10-CM | POA: Diagnosis not present

## 2020-04-19 DIAGNOSIS — I2510 Atherosclerotic heart disease of native coronary artery without angina pectoris: Secondary | ICD-10-CM | POA: Diagnosis not present

## 2020-04-19 DIAGNOSIS — Z0000 Encounter for general adult medical examination without abnormal findings: Secondary | ICD-10-CM | POA: Diagnosis not present

## 2020-04-19 DIAGNOSIS — R3911 Hesitancy of micturition: Secondary | ICD-10-CM | POA: Diagnosis not present

## 2020-04-24 ENCOUNTER — Other Ambulatory Visit: Payer: Self-pay

## 2020-04-24 ENCOUNTER — Ambulatory Visit: Payer: BC Managed Care – PPO | Admitting: Cardiovascular Disease

## 2020-04-24 ENCOUNTER — Encounter: Payer: Self-pay | Admitting: Cardiovascular Disease

## 2020-04-24 ENCOUNTER — Ambulatory Visit: Payer: BC Managed Care – PPO | Admitting: Emergency Medicine

## 2020-04-24 ENCOUNTER — Ambulatory Visit (INDEPENDENT_AMBULATORY_CARE_PROVIDER_SITE_OTHER)
Admission: RE | Admit: 2020-04-24 | Discharge: 2020-04-24 | Disposition: A | Discharge status: Home / Self Care | Payer: BC Managed Care – PPO | Source: Ambulatory Visit | Attending: Emergency Medicine | Admitting: Emergency Medicine

## 2020-04-24 ENCOUNTER — Encounter: Payer: Self-pay | Admitting: Emergency Medicine

## 2020-04-24 ENCOUNTER — Inpatient Hospital Stay: Admission: RE | Admit: 2020-04-24 | Payer: BC Managed Care – PPO | Source: Ambulatory Visit

## 2020-04-24 VITALS — BP 110/70 | HR 61 | Ht 74.0 in | Wt 186.2 lb

## 2020-04-24 VITALS — BP 116/72 | HR 71 | Temp 98.1°F | Ht 74.0 in | Wt 187.0 lb

## 2020-04-24 DIAGNOSIS — E782 Mixed hyperlipidemia: Secondary | ICD-10-CM

## 2020-04-24 DIAGNOSIS — R911 Solitary pulmonary nodule: Secondary | ICD-10-CM | POA: Diagnosis not present

## 2020-04-24 DIAGNOSIS — R053 Chronic cough: Secondary | ICD-10-CM

## 2020-04-24 DIAGNOSIS — I708 Atherosclerosis of other arteries: Secondary | ICD-10-CM | POA: Diagnosis not present

## 2020-04-24 DIAGNOSIS — I2510 Atherosclerotic heart disease of native coronary artery without angina pectoris: Secondary | ICD-10-CM | POA: Diagnosis not present

## 2020-04-24 DIAGNOSIS — R9389 Abnormal findings on diagnostic imaging of other specified body structures: Secondary | ICD-10-CM | POA: Diagnosis not present

## 2020-04-24 DIAGNOSIS — J984 Other disorders of lung: Secondary | ICD-10-CM | POA: Diagnosis not present

## 2020-04-24 DIAGNOSIS — I700 Atherosclerosis of aorta: Secondary | ICD-10-CM | POA: Diagnosis not present

## 2020-04-24 NOTE — Progress Notes (Signed)
Subjective:    Patient ID: Jake Kennedy, male    DOB: Aug 31, 1950, 70 y.o.   MRN: [id number]  HPI    ROV 04/23/19 --70 year old never smoker with CLL followed for an abnormal CT scan of the chest with bronchiectatic change and some subpleural micronodular disease.  Underwent reassuring bronchoscopy 09/2018.  We repeated his CT on 04/16/2019 and I have reviewed, this shows overall improvement with less peribronchovascular thickening and micronodularity.  He has mild cylindrical bronchiectasis.  There is a stable 9.8 mm left lower lobe nodule.  His cough is in the am, clears mucous and then is not bothered through the day. He stopped nasacort, mucinex. He stopped flutter valve for now. He is using nasal saline. He has had his covid vaccines.  MDM: -Reviewed CT scan of the chest 04/16/2019 -Reviewed office notes from Dr. Irene Limbo 01/15/19 and Dr Lucia Gaskins 02/10/19  ROV 04/24/20 --this follow-up visit 70 year old never smoker with a history of CLL and micronodular disease and bronchiectatic change on CT scan of the chest.  We have been following him clinically and with serial CT scans of the chest.  He is not on any current bronchodilator therapy.  He does not have dyspnea.  He does have some cough with clear mucus that he reports is better.  He feels great, good exercise tolerance, minimal cough.   He underwent CT scan of the chest today which I have reviewed, shows minimal bronchiectatic change, no new infiltrates or nodules.  His left lower lobe pulmonary nodule is stable, possibly a little bit smaller than last year  MDM: Reviewed oncology note from Dr.Kale 11/01/2019  Review of Systems  Constitutional: Negative for fatigue, fever and unexpected weight change.  HENT: Positive for congestion. Negative for dental problem, ear pain, nosebleeds, postnasal drip, rhinorrhea, sinus pressure, sneezing, sore throat and trouble swallowing.   Eyes: Negative for redness and itching.  Respiratory: Positive for cough.  Negative for chest tightness, shortness of breath and wheezing.   Cardiovascular: Negative for palpitations and leg swelling.  Gastrointestinal: Negative for nausea and vomiting.  Genitourinary: Negative for dysuria.  Musculoskeletal: Negative for joint swelling.  Skin: Negative for rash.  Neurological: Negative for headaches.  Hematological: Does not bruise/bleed easily.  Psychiatric/Behavioral: Negative for dysphoric mood. The patient is not nervous/anxious.        Objective:   Physical Exam Vitals:   04/24/20 1516  BP: 116/72  Pulse: 71  Temp: 98.1 F (36.7 C)  TempSrc: Temporal  SpO2: 96%  Weight: 187 lb (84.8 kg)  Height: 6\' 2"  (1.88 m)   Gen: Pleasant, tall and thin, in no distress,  normal affect  ENT: No lesions,  mouth clear,  oropharynx clear, no postnasal drip  Neck: No JVD, no stridor  Lungs: No use of accessory muscles, no crackles or wheezing on normal respiration, no wheeze on forced expiration  Cardiovascular: RRR, heart sounds normal, no murmur or gallops, no peripheral edema  Musculoskeletal: No deformities, no cyanosis or clubbing  Neuro: alert, awake, non focal  Skin: Warm, no lesions or rash      Assessment & Plan:  Abnormal CT of the chest Very subtle and stable bronchiectatic change.  His left lower lobe pulmonary nodule stable, possibly even smaller than prior.  We could consider stopping his serial CT scans but given his CLL diagnosis I had like to repeat 1 more and ensure stability, correlate with his clinical status.  If he remains stable then we can talk about discontinuing schedule scans  and only follow if there is a clinical change.  Chronic cough Much improved.  He has minimal mucus that he has to clear in the morning, not every day.  No new interventions planned for now.  [name], MD, PhD 04/24/2020, 3:28 PM [facility] [phone] or if no answer [phone]

## 2020-04-24 NOTE — Addendum Note (Signed)
Addended by: Gavin Potters R on: 04/24/2020 04:46 PM   Modules accepted: Orders

## 2020-04-24 NOTE — Patient Instructions (Signed)

## 2020-04-24 NOTE — Patient Instructions (Signed)
We will repeat your CT scan of the chest in March 2023 to follow your mild bronchiectasis and left lower lobe pulmonary nodule.  If the nodule remains stable at that time then we can probably stop following scheduled scans and only repeat imaging if you have a clinical change. Continue to follow with Dr. Irene Limbo and Dr Burt Knack as planned.  Follow with Dr. Lamonte Sakai in 12 months or sooner if you have any problems.

## 2020-04-24 NOTE — Assessment & Plan Note (Signed)
Much improved.  He has minimal mucus that he has to clear in the morning, not every day.  No new interventions planned for now.

## 2020-04-24 NOTE — Progress Notes (Signed)
Cardiology Office Note:    Date:  04/24/2020   ID:  Jake Kennedy, DOB 1951/01/23, MRN [id number]  PCP:  [name], [place]  Cardiologist:  Sherren Mocha, MD  Advanced Practice Provider:  No care team member to display Electrophysiologist:  None       Referring MD: [name], MD   Chief Complaint  Patient presents with  . Coronary Artery Disease    History of Present Illness:    Jake Kennedy is a 70 y.o. male with a hx of coronary artery disease, presenting for follow-up evaluation.  The patient presented in 2016 with exertional angina.  He had a nuclear stress test that demonstrated severe ischemia.  Cardiac catheterization demonstrated moderate RCA stenosis and critical proximal LAD stenosis.  He was treated with PCI of the LAD with a drug-eluting stent.  He had recurrent angina the following year and cardiac catheterization demonstrated patency at his stent site and no change in the moderate nonobstructive RCA stenosis.  Ongoing medical therapy was recommended.  The patient is also followed for CLL and he reports stability after undergoing initial chemotherapy.  He is here alone today.  He reports no interval cardiac problems.  He specifically denies chest pain, chest pressure, or shortness of breath.  Recent labs demonstrated a cholesterol 102, HDL 35, LDL 50, triglycerides 85.  He remains physically active with regular exercise and denies any exertional symptoms.  Past Medical History:  Diagnosis Date  . Basal cell carcinoma    moles removed in 2007 and 2012 from L nose and L hip  . BPH (benign prostatic hypertrophy)   . CAD (coronary artery disease), native coronary artery    a. 02/10/2015 95% mid LAD stenosis s/p DES  b. 07/15/16: LHC showed stable non obst dz and patent stent  . CLL (chronic lymphocytic leukemia) ([place])   . Coronary artery disease   . ED (erectile dysfunction)   . Lymphocytosis 02/11/2015   seen by Dr. Lindi Adie 02/11/2015,  working up for possible CLL    Past Surgical History:  Procedure Laterality Date  . CARDIAC CATHETERIZATION  02/10/2015  . CARDIAC CATHETERIZATION N/A 02/10/2015   Procedure: Left Heart Cath and Coronary Angiography;  Surgeon: Sherren Mocha, MD;  Location: [facility];  Service: Cardiovascular;  Laterality: N/A;  . CARDIAC CATHETERIZATION N/A 02/10/2015   Procedure: Coronary Stent Intervention;  Surgeon: Sherren Mocha, MD;  Location: [facility];  Service: Cardiovascular;  Laterality: N/A;  . CORONARY STENT PLACEMENT  02/10/2015   DES to LAD  . LAMINECTOMY    . LEFT HEART CATH AND CORONARY ANGIOGRAPHY N/A 07/15/2016   Procedure: Left Heart Cath and Coronary Angiography;  Surgeon: Martinique, Peter M, MD;  Location: [facility];  Service: Cardiovascular;  Laterality: N/A;  . TONSILLECTOMY    . VIDEO BRONCHOSCOPY WITH ENDOBRONCHIAL NAVIGATION N/A 09/16/2018   Procedure: VIDEO BRONCHOSCOPY WITH ENDOBRONCHIAL NAVIGATION AND BIOPIES;  Surgeon: Collene Gobble, MD;  Location: [facility];  Service: Thoracic;  Laterality: N/A;    Current Medications: Current Meds  Medication Sig  . aspirin 81 MG tablet Take 81 mg by mouth daily.  Marland Kitchen atorvastatin (LIPITOR) 80 MG tablet TAKE 1 TABLET(80 MG) BY MOUTH DAILY AT 6 PM  . Multiple Vitamin (MULTIVITAMIN) tablet Take 1 tablet by mouth daily.  . Multiple Vitamins-Minerals (PRESERVISION AREDS PO) Take 2 capsules by mouth daily.  . nitroGLYCERIN (NITROSTAT) 0.4 MG SL tablet PLACE 1 TABLET UNDER  THE TONGUE EVERY 5 MINUTES AS NEEDED FOR CHEST PAIN  . Omega-3 Fatty Acids (FISH OIL CONCENTRATE PO) Take 1 capsule by mouth daily.      Allergies:   Adhesive [tape]   Social History   Socioeconomic History  . Marital status: Married    Spouse name: Not on file  . Number of children: Not on file  . Years of education: Not on file  . Highest education level: Not on file  Occupational History  . Not on file  Tobacco Use  . Smoking status: Never  Smoker  . Smokeless tobacco: Never Used  Vaping Use  . Vaping Use: Never used  Substance and Sexual Activity  . Alcohol use: Yes    Alcohol/week: 0.0 standard drinks    Comment: occasional  . Drug use: No  . Sexual activity: Not on file  Other Topics Concern  . Not on file  Social History Narrative  . Not on file   Social Determinants of Health   Financial Resource Strain: Not on file  Food Insecurity: Not on file  Transportation Needs: Not on file  Physical Activity: Not on file  Stress: Not on file  Social Connections: Not on file     Family History: The patient's family history includes Diabetes in his mother; Healthy in his brother, brother, maternal grandfather, maternal grandmother, paternal grandfather, paternal grandmother, and sister; Heart attack in his mother; Heart failure in his mother.  ROS:   Please see the history of present illness.    All other systems reviewed and are negative.  EKGs/Labs/Other Studies Reviewed:    The following studies were reviewed today: I personally reviewed the patient's cardiac catheterization films from 2017 today.  EKG:  EKG is ordered today.  The ekg ordered today demonstrates normal sinus rhythm 61 bpm, left axis deviation, otherwise within normal limits.  Recent Labs: 11/01/2019: ALT 39; BUN 21; Creatinine 0.85; Hemoglobin 15.5; Platelets 144; Potassium 4.7; Sodium 139  Recent Lipid Panel    Component Value Date/Time   CHOL 117 10/16/2018 1125   CHOL 83 (L) 11/11/2017 1010   TRIG 69 10/16/2018 1125   HDL 35 (L) 10/16/2018 1125   HDL 23 (L) 11/11/2017 1010   CHOLHDL 3.3 10/16/2018 1125   VLDL 14 10/16/2018 1125   LDLCALC 68 10/16/2018 1125   LDLCALC 45 11/11/2017 1010     Risk Assessment/Calculations:       Physical Exam:    VS:  BP 110/70   Pulse 61   Ht 6\' 2"  (1.88 m)   Wt 186 lb 3.2 oz (84.5 kg)   SpO2 96%   BMI 23.91 kg/m     Wt Readings from Last 3 Encounters:  04/24/20 186 lb 3.2 oz (84.5 kg)   11/01/19 188 lb 6.4 oz (85.5 kg)  05/17/19 190 lb 1.6 oz (86.2 kg)     GEN:  Well nourished, well developed in no acute distress HEENT: Normal NECK: No JVD; No carotid bruits LYMPHATICS: No lymphadenopathy CARDIAC: RRR, no murmurs, rubs, gallops RESPIRATORY:  Clear to auscultation without rales, wheezing or rhonchi  ABDOMEN: Soft, non-tender, non-distended MUSCULOSKELETAL:  No edema; No deformity  SKIN: Warm and dry NEUROLOGIC:  Alert and oriented x 3 PSYCHIATRIC:  Normal affect   ASSESSMENT:    1. Coronary artery disease involving native coronary artery of native heart without angina pectoris   2. Mixed hyperlipidemia    PLAN:    In order of problems listed above:  1. The patient is stable  at present.  He reports no anginal symptoms.  We reviewed his history of exertional angina when he initially presented with tight LAD stenosis.  He will continue on aspirin and atorvastatin without change. 2. Treated with atorvastatin 80 mg daily.  Excellent lipid panel outlined above.  We discussed his low HDL cholesterol, recommendations for continued exercise, and lack of data showing benefit for HDL raising therapies.        Medication Adjustments/Labs and Tests Ordered: Current medicines are reviewed at length with the patient today.  Concerns regarding medicines are outlined above.  Orders Placed This Encounter  Procedures  . EKG 12-Lead   No orders of the defined types were placed in this encounter.   Patient Instructions  Medication Instructions:  Your provider recommends that you continue on your current medications as directed. Please refer to the Current Medication list given to you today.   *If you need a refill on your cardiac medications before your next appointment, please call your pharmacy*   Follow-Up: At [facility], you and your health needs are our priority.  As part of our continuing mission to provide you with exceptional heart care, we have created  designated Provider Care Teams.  These Care Teams include your primary Cardiologist (physician) and Advanced Practice Providers (APPs -  Physician Assistants and Nurse Practitioners) who all work together to provide you with the care you need, when you need it. Your next appointment:   12 month(s) The format for your next appointment:   In Person Provider:   You may see Sherren Mocha, MD or one of the following Advanced Practice Providers on your designated Care Team:    Richardson Dopp, PA-C  Robbie Lis, [place]      Signed, Sherren Mocha, MD  04/24/2020 11:44 AM    [place]

## 2020-04-24 NOTE — Assessment & Plan Note (Signed)
Very subtle and stable bronchiectatic change.  His left lower lobe pulmonary nodule stable, possibly even smaller than prior.  We could consider stopping his serial CT scans but given his CLL diagnosis I had like to repeat 1 more and ensure stability, correlate with his clinical status.  If he remains stable then we can talk about discontinuing schedule scans and only follow if there is a clinical change.

## 2020-04-25 DIAGNOSIS — H16102 Unspecified superficial keratitis, left eye: Secondary | ICD-10-CM | POA: Diagnosis not present

## 2020-04-26 DIAGNOSIS — Z23 Encounter for immunization: Secondary | ICD-10-CM | POA: Diagnosis not present

## 2020-05-04 ENCOUNTER — Other Ambulatory Visit: Payer: Self-pay

## 2020-05-04 DIAGNOSIS — C9110 Chronic lymphocytic leukemia of B-cell type not having achieved remission: Secondary | ICD-10-CM

## 2020-05-08 ENCOUNTER — Inpatient Hospital Stay: Payer: BC Managed Care – PPO | Admitting: Hematology

## 2020-05-08 ENCOUNTER — Inpatient Hospital Stay: Payer: BC Managed Care – PPO | Attending: Hematology

## 2020-05-08 ENCOUNTER — Other Ambulatory Visit: Payer: Self-pay

## 2020-05-08 VITALS — BP 107/71 | HR 66 | Temp 98.0°F | Resp 17 | Ht 74.0 in | Wt 187.2 lb

## 2020-05-08 DIAGNOSIS — I700 Atherosclerosis of aorta: Secondary | ICD-10-CM | POA: Insufficient documentation

## 2020-05-08 DIAGNOSIS — C9110 Chronic lymphocytic leukemia of B-cell type not having achieved remission: Secondary | ICD-10-CM | POA: Diagnosis not present

## 2020-05-08 DIAGNOSIS — R911 Solitary pulmonary nodule: Secondary | ICD-10-CM | POA: Diagnosis not present

## 2020-05-08 DIAGNOSIS — Z85820 Personal history of malignant melanoma of skin: Secondary | ICD-10-CM | POA: Insufficient documentation

## 2020-05-08 DIAGNOSIS — N400 Enlarged prostate without lower urinary tract symptoms: Secondary | ICD-10-CM | POA: Diagnosis not present

## 2020-05-08 DIAGNOSIS — E041 Nontoxic single thyroid nodule: Secondary | ICD-10-CM | POA: Diagnosis not present

## 2020-05-08 DIAGNOSIS — Z9221 Personal history of antineoplastic chemotherapy: Secondary | ICD-10-CM | POA: Diagnosis not present

## 2020-05-08 DIAGNOSIS — Z7982 Long term (current) use of aspirin: Secondary | ICD-10-CM | POA: Diagnosis not present

## 2020-05-08 DIAGNOSIS — R161 Splenomegaly, not elsewhere classified: Secondary | ICD-10-CM | POA: Diagnosis not present

## 2020-05-08 DIAGNOSIS — I2510 Atherosclerotic heart disease of native coronary artery without angina pectoris: Secondary | ICD-10-CM | POA: Insufficient documentation

## 2020-05-08 DIAGNOSIS — Z79899 Other long term (current) drug therapy: Secondary | ICD-10-CM | POA: Insufficient documentation

## 2020-05-08 LAB — CBC WITH DIFFERENTIAL (CANCER CENTER ONLY)
Abs Immature Granulocytes: 0.03 10*3/uL (ref 0.00–0.07)
Basophils Absolute: 0.1 10*3/uL (ref 0.0–0.1)
Basophils Relative: 1 %
Eosinophils Absolute: 0.1 10*3/uL (ref 0.0–0.5)
Eosinophils Relative: 2 %
HCT: 49.2 % (ref 39.0–52.0)
Hemoglobin: 16.2 g/dL (ref 13.0–17.0)
Immature Granulocytes: 1 %
Lymphocytes Relative: 14 %
Lymphs Abs: 0.9 10*3/uL (ref 0.7–4.0)
MCH: 30.3 pg (ref 26.0–34.0)
MCHC: 32.9 g/dL (ref 30.0–36.0)
MCV: 92.1 fL (ref 80.0–100.0)
Monocytes Absolute: 0.7 10*3/uL (ref 0.1–1.0)
Monocytes Relative: 11 %
Neutro Abs: 4.5 10*3/uL (ref 1.7–7.7)
Neutrophils Relative %: 71 %
Platelet Count: 176 10*3/uL (ref 150–400)
RBC: 5.34 MIL/uL (ref 4.22–5.81)
RDW: 13.1 % (ref 11.5–15.5)
WBC Count: 6.3 10*3/uL (ref 4.0–10.5)
nRBC: 0 % (ref 0.0–0.2)

## 2020-05-08 LAB — CMP (CANCER CENTER ONLY)
ALT: 38 U/L (ref 0–44)
AST: 24 U/L (ref 15–41)
Albumin: 4.3 g/dL (ref 3.5–5.0)
Alkaline Phosphatase: 71 U/L (ref 38–126)
Anion gap: 12 (ref 5–15)
BUN: 26 mg/dL — ABNORMAL HIGH (ref 8–23)
CO2: 26 mmol/L (ref 22–32)
Calcium: 8.7 mg/dL — ABNORMAL LOW (ref 8.9–10.3)
Chloride: 103 mmol/L (ref 98–111)
Creatinine: 0.92 mg/dL (ref 0.61–1.24)
GFR, Estimated: >60 mL/min (ref >60)
Glucose, Bld: 93 mg/dL (ref 70–99)
Potassium: 4.9 mmol/L (ref 3.5–5.1)
Sodium: 141 mmol/L (ref 135–145)
Total Bilirubin: 0.9 mg/dL (ref 0.3–1.2)
Total Protein: 6.5 g/dL (ref 6.5–8.1)

## 2020-05-08 LAB — LACTATE DEHYDROGENASE: LDH: 196 U/L — ABNORMAL HIGH (ref 98–192)

## 2020-05-14 NOTE — Progress Notes (Incomplete)
HEMATOLOGY/ONCOLOGY CLINIC NOTE  Date of Service:  .05/08/2020  Patient Care Team: [name], MD as PCP - General (Internal Medicine) Sherren Mocha, MD as PCP - Cardiology (Cardiology)  CHIEF COMPLAINTS/PURPOSE OF CONSULTATION:  Chronic Lymphocytic Leukemia  Oncologic History:   Oncology History  Chronic lymphocytic leukemia (CLL), B-cell ([place])  02/14/2015 Initial Diagnosis   Monoclonal B-cell population CD5 and CD20 positive, differential diagnosis CLL versus mantle cell lymphoma ( patient had absolute lymphocyte count of 8000 in 2010), remained stable.   07/09/2018 -  Chemotherapy   The patient had obinutuzumab (GAZYVA) 100 mg in sodium chloride 0.9 % 100 mL (0.9615 mg/mL) chemo infusion, 100 mg, Intravenous, Once, 2 of 6 cycles Administration: 100 mg (07/09/2018), 900 mg (07/10/2018), 1,000 mg (07/31/2018)  for chemotherapy treatment.       HISTORY OF PRESENTING ILLNESS:   URIAS SHEEK is a wonderful 70 y.o. male who has been referred to [place] by Dr. [name] for evaluation and management of Chronic Lymphocytic Leukemia. The pt was formerly under the care of my colleague Dr. Nicholas Lose. He is accompanied today by his wife. The pt reports that he is doing well overall.  The pt reports that his course of CLL had been pretty consistent since diagnosis in 2017, until roughly 3 months ago in roughly November 2019. He notes that he has had recent increase in fatigue in the last 3 months, and more acutely in the last 3 weeks. He endorses occasional fevers over the last two weeks during the night, highest at 100.2 or 100.3. He also endorses some night sweats, not every night, some of these being "drenching." He notes that he has been eating well for the last 6 months, however has lost 8 pounds in that interim. He endorses regular exercising until the past month, due to his worsened fatigue, and had previously regularly run upwards of 3 miles and bike for several miles as well.  The  pt notes that he can't smell very well, in the last 2-3 weeks, and his hearing has also reduced in the last week. He senses that his enlarged lymph nodes are pressing on his neck, and characterizes this as similar to when one has an acute cold and congestion. He had acute pain in his right hip a week ago and saw my colleague Sandi Mealy, PA in Symptom Managment. He was given Norco but denies taking this because he didn't want to. He endorses a positional element to his hip pain, which e attributes to the pain being from an enlarged lymph node. He notes that his hip pain had sudden onset on the night of 03/18/18. He notes that his neck lymph nodes have grown in the last 3 weeks to a month. The pt notes that his lymph nodes in his neck, armpits, and groin have been enlarged.  The pt tried an antibiotic course 2 weeks ago with his PCP for a suspected sinus infection, during which his cervical lymph nodes enlarged further. He notes that his neck lymph nodes are somewhat improved today as compared to this. The pt denies frequent infections.   Of note prior to the patient's visit today, pt has had CT N/C/A/P completed on 03/19/18 with results revealing NECK: Multiple enlarged bilateral cervical lymph nodes consistent with the given diagnosis of CLL. The largest node is a right level 3 lymph node measuring 3.4 x 2.8 x 1.2 cm. 2. Degenerative changes of the cervical spine. 3. Hypodense lesion in the right side of  the tongue measuring up to 1.5 cm. This could represent focal infection. Neoplasm is considered less likely. The lesion appears to be superficial along the right lateral aspect of the anterior tongue a. This should be amenable to direct inspection, C/A/P: Marked adenopathy within the chest, abdomen, and pelvis, consistent with active lymphoma/leukemia. 2. Splenomegaly is nonspecific. Splenic involvement cannot be excluded. 3. Right greater than left and basilar predominant peribronchovascular interstitial  thickening and nodularity. Considerations include pulmonary involvement of lymphoma and/or Infection/aspiration. 4. Coronary artery atherosclerosis. Aortic Atherosclerosis. 5. Trace nonspecific pelvic fluid.  Most recent lab results (03/17/18) of CBC w/diff and CMP is as follows: all values are WNL except for WBC at 13.1k, RBC at 2.67, HGB at 10.1, HCT at 31.0, MCV at 116.1, MCH at 37.8, PLT at 103k, ANC at 900, Lymphs abs at 11.1k, BUN at 31, Calcium at 8.7, Total Protein at 6.2, Albumin at 3.4. 03/17/18 LDH at 241  On review of systems, pt reports new fatigue, recent fevers, recent night sweats, recently grown neck lymph nodes, eating well, some weight loss, right groin pain, and denies chills, SOB, ear pain, pain along the spine, frequent infections, flank pain, leg swelling, testicular pain or swelling, and any other symptoms.   Interval History:   ELL TISO returns today for management and evaluation of his CLL.  Patient notes he is feeling well and has no acute new symptoms. Labs - no evidence of CLL progression. Recent ct - 3/22-  Stable.  On review of systems, pt denies fevers, chills, night sweats, SOB, unexpected weight loss, new lumps/bumps, leg swelling, rash, testicular swelling and any other symptoms.    MEDICAL HISTORY:  Past Medical History:  Diagnosis Date  . Basal cell carcinoma    moles removed in 2007 and 2012 from L nose and L hip  . BPH (benign prostatic hypertrophy)   . CAD (coronary artery disease), native coronary artery    a. 02/10/2015 95% mid LAD stenosis s/p DES  b. 07/15/16: LHC showed stable non obst dz and patent stent  . CLL (chronic lymphocytic leukemia) ([place])   . Coronary artery disease   . ED (erectile dysfunction)   . Lymphocytosis 02/11/2015   seen by Dr. Lindi Adie 02/11/2015, working up for possible CLL    SURGICAL HISTORY: Past Surgical History:  Procedure Laterality Date  . CARDIAC CATHETERIZATION  02/10/2015  . CARDIAC CATHETERIZATION N/A 02/10/2015    Procedure: Left Heart Cath and Coronary Angiography;  Surgeon: Sherren Mocha, MD;  Location: [facility];  Service: Cardiovascular;  Laterality: N/A;  . CARDIAC CATHETERIZATION N/A 02/10/2015   Procedure: Coronary Stent Intervention;  Surgeon: Sherren Mocha, MD;  Location: [facility];  Service: Cardiovascular;  Laterality: N/A;  . CORONARY STENT PLACEMENT  02/10/2015   DES to LAD  . LAMINECTOMY    . LEFT HEART CATH AND CORONARY ANGIOGRAPHY N/A 07/15/2016   Procedure: Left Heart Cath and Coronary Angiography;  Surgeon: Martinique, Peter M, MD;  Location: [facility];  Service: Cardiovascular;  Laterality: N/A;  . TONSILLECTOMY    . VIDEO BRONCHOSCOPY WITH ENDOBRONCHIAL NAVIGATION N/A 09/16/2018   Procedure: VIDEO BRONCHOSCOPY WITH ENDOBRONCHIAL NAVIGATION AND BIOPIES;  Surgeon: Collene Gobble, MD;  Location: [facility];  Service: Thoracic;  Laterality: N/A;    SOCIAL HISTORY: Social History   Socioeconomic History  . Marital status: Married    Spouse name: Not on file  . Number of children: Not on file  . Years of education: Not  on file  . Highest education level: Not on file  Occupational History  . Not on file  Tobacco Use  . Smoking status: Never Smoker  . Smokeless tobacco: Never Used  Vaping Use  . Vaping Use: Never used  Substance and Sexual Activity  . Alcohol use: Yes    Alcohol/week: 0.0 standard drinks    Comment: occasional  . Drug use: No  . Sexual activity: Not on file  Other Topics Concern  . Not on file  Social History Narrative  . Not on file   Social Determinants of Health   Financial Resource Strain: Not on file  Food Insecurity: Not on file  Transportation Needs: Not on file  Physical Activity: Not on file  Stress: Not on file  Social Connections: Not on file  Intimate Partner Violence: Not on file    FAMILY HISTORY: Family History  Problem Relation Age of Onset  . Diabetes Mother   . Heart failure Mother   . Heart attack Mother         occured in 13s.   . Healthy Sister   . Healthy Brother   . Healthy Maternal Grandmother   . Healthy Maternal Grandfather   . Healthy Paternal Grandmother   . Healthy Paternal Grandfather   . Healthy Brother     ALLERGIES:  is allergic to adhesive [tape].  MEDICATIONS:  Current Outpatient Medications  Medication Sig Dispense Refill  . aspirin 81 MG tablet Take 81 mg by mouth daily.    Marland Kitchen atorvastatin (LIPITOR) 80 MG tablet TAKE 1 TABLET(80 MG) BY MOUTH DAILY AT 6 PM 90 tablet 0  . Multiple Vitamin (MULTIVITAMIN) tablet Take 1 tablet by mouth daily.    . Multiple Vitamins-Minerals (PRESERVISION AREDS PO) Take 2 capsules by mouth daily.    . nitroGLYCERIN (NITROSTAT) 0.4 MG SL tablet PLACE 1 TABLET UNDER THE TONGUE EVERY 5 MINUTES AS NEEDED FOR CHEST PAIN 25 tablet 3  . Omega-3 Fatty Acids (FISH OIL CONCENTRATE PO) Take 1 capsule by mouth daily.      No current facility-administered medications for this visit.    REVIEW OF SYSTEMS:   .10 Point review of Systems was done is negative except as noted above.  PHYSICAL EXAMINATION: ECOG FS:1 - Symptomatic but completely ambulatory  Vitals:   05/08/20 1334  BP: 107/71  Pulse: 66  Resp: 17  Temp: 98 F (36.7 C)  SpO2: 98%   Wt Readings from Last 3 Encounters:  05/08/20 187 lb 3.2 oz (84.9 kg)  04/24/20 187 lb (84.8 kg)  04/24/20 186 lb 3.2 oz (84.5 kg)   Body mass index is 24.04 kg/m.   Marland Kitchen GENERAL:alert, in no acute distress and comfortable SKIN: no acute rashes, no significant lesions EYES: conjunctiva are pink and non-injected, sclera anicteric OROPHARYNX: MMM, no exudates, no oropharyngeal erythema or ulceration NECK: supple, no JVD LYMPH:  no palpable lymphadenopathy in the cervical, axillary or inguinal regions LUNGS: clear to auscultation b/l with normal respiratory effort HEART: regular rate & rhythm ABDOMEN:  normoactive bowel sounds , non tender, not distended. Extremity: no pedal edema PSYCH: alert &  oriented x 3 with fluent speech NEURO: no focal motor/sensory deficits   LABORATORY DATA:  I have reviewed the data as listed  . CBC Latest Ref Rng & Units 05/08/2020 11/01/2019 05/17/2019  WBC 4.0 - 10.5 K/uL 6.3 4.9 4.7  Hemoglobin 13.0 - 17.0 g/dL 16.2 15.5 16.8  Hematocrit 39.0 - 52.0 % 49.2 46.9 50.9  Platelets 150 -  400 K/uL 176 144(L) 157  ANC 700<---600<---1200  . CMP Latest Ref Rng & Units 05/08/2020 11/01/2019 05/17/2019  Glucose 70 - 99 mg/dL 93 108(H) 138(H)  BUN 8 - 23 mg/dL 26(H) 21 22  Creatinine 0.61 - 1.24 mg/dL 0.92 0.85 1.03  Sodium 135 - 145 mmol/L 141 139 141  Potassium 3.5 - 5.1 mmol/L 4.9 4.7 4.7  Chloride 98 - 111 mmol/L 103 106 103  CO2 22 - 32 mmol/L 26 28 28   Calcium 8.9 - 10.3 mg/dL 8.7(L) 8.8(L) 9.0  Total Protein 6.5 - 8.1 g/dL 6.5 5.9(L) 6.5  Total Bilirubin 0.3 - 1.2 mg/dL 0.9 0.8 0.9  Alkaline Phos 38 - 126 U/L 71 71 90  AST 15 - 41 U/L 24 22 21   ALT 0 - 44 U/L 38 39 36  01/04/2019 [place] TYROID (Accession [id number])    04/30/18 Outside Labs:                RADIOGRAPHIC STUDIES: I have personally reviewed the radiological images as listed and agreed with the findings in the report. CT Chest Wo Contrast  Result Date: 04/24/2020 CLINICAL DATA:  Pulmonary nodule. EXAM: CT CHEST WITHOUT CONTRAST TECHNIQUE: Multidetector CT imaging of the chest was performed following the standard protocol without IV contrast. COMPARISON:  04/16/2019 and 03/19/2018. FINDINGS: Cardiovascular: Atherosclerotic calcification of the aorta, aortic valve and coronary arteries. Heart size normal. No pericardial effusion. Mediastinum/Nodes: No pathologically enlarged mediastinal or axillary lymph nodes. Esophagus is unremarkable. Lungs/Pleura: Biapical pleuroparenchymal scarring. 9 mm nodule in the left lower lobe (3/135) unchanged from 04/16/2019 and smaller than on 03/19/2018, consistent with a benign lesion. No suspicious pulmonary nodules. No pleural fluid. Airway is  unremarkable. Upper Abdomen: Subcentimeter low-attenuation lesion in the liver is too small to characterize but is unchanged and therefore likely benign. Visualized portions of the liver, gallbladder, adrenal glands, kidneys, spleen, pancreas, stomach and bowel are otherwise unremarkable. No upper abdominal adenopathy. Musculoskeletal: Degenerative changes in the spine and shoulders. IMPRESSION: 1. 9 mm left lower lobe nodule is unchanged from 04/16/2019 and is smaller than on 03/19/2018, indicative of a benign lesion. 2. Aortic atherosclerosis (ICD10-I70.0). Coronary artery calcification. Electronically Signed   By: Lorin Picket M.D.   On: 04/24/2020 15:37    ASSESSMENT & PLAN:  70 y.o. male with  1. Chronic Lymphocytic Leukemia Initial diagnosis on 02/13/15 Peripheral blood flow cytometry which revealed a Monoclonal B-cell population 09/25/15 Cytogenetics ruled out Mantle cell lymphoma. 12/25/17 FISH study ruled out translocation 11;14 12/30/17 BM Biopsy revealed CLL without evidence of transformation  03/17/18 FISH CLL Prognostic panel revealed an 11q deletion and Trisomy 12  03/19/18 CT N/C/A/P revealed NECK: Multiple enlarged bilateral cervical lymph nodes consistent with the given diagnosis of CLL. The largest node is a right level 3 lymph node measuring 3.4 x 2.8 x 1.2 cm. 2. Degenerative changes of the cervical spine. 3. Hypodense lesion in the right side of the tongue measuring up to 1.5 cm. This could represent focal infection. Neoplasm is considered less likely. The lesion appears to be superficial along the right lateral aspect of the anterior tongue a. This should be amenable to direct inspection, C/A/P: Marked adenopathy within the chest, abdomen, and pelvis, consistent with active lymphoma/leukemia. 2. Splenomegaly is nonspecific. Splenic involvement cannot be excluded. 3. Right greater than left and basilar predominant peribronchovascular interstitial thickening and nodularity.  Considerations include pulmonary involvement of lymphoma and/or Infection/aspiration. 4. Coronary artery atherosclerosis. Aortic Atherosclerosis. 5. Trace nonspecific pelvic fluid.  03/26/18 Left cervical  LN biopsy revealed SLL/CLL  08/11/18 CT Neck revealed "Decreased cervical lymphadenopathy with all remaining nodes being subcentimeter in short axis. 2. Sinusitis."  08/11/18 CT Chest revealed "Interval improvement without resolution of the basilar predominant peripheral peribronchovascular nodularity. 2. Interval development of a new masslike consolidative opacity in the medial right lung measuring up to 3.3 cm. This may be infectious/inflammatory. Lymphoma involvement not excluded. 3. Interval resolution of splenomegaly with small volume perisplenic ascites identified today. 4.  Aortic Atherosclerosis."  04/16/19 CT Chest ([id number]) revealed "1. Overall today's study demonstrates improved appearance of the lungs with resolution of widespread thickening of the peribronchovascular interstitium as well as scattered areas of peribronchovascular micronodularity seen throughout the lungs bilaterally. Mild cylindrical bronchiectasis is again noted. 2. Stable 9 x 8 mm pulmonary nodule in the left lower lobe (axial image 126 of series 3), strongly favored to be benign."  #2 Thyroid nodule- appears benign on Ultrasound. 10/09/2018 US THYROID (Accession [id number]) revealed "1. Solitary 1.2 cm benign colloid containing cyst within the left lobe of the thyroid does not meet imaging criteria to recommend percutaneous sampling or continued dedicated follow-up. 2. Otherwise, normal thyroid ultrasound."   PLAN: -Discussed pt labwork today, 11/01/19; blood counts and chemistries are stable, LDH is borderline elevated.  -No lab or clinical evidence of CLL progression/recurrence at this time.  -No indication to treat. Will continue watchful observation every 6 months. -Recommend pt f/u with Pulmonology for lung  lesion monitoring -Will see back in 6 months with labs   FOLLOW UP: RTC with Dr Irene Limbo with labs in 6 months   The total time spent in the appt was 20 minutes and more than 50% was on counseling and direct patient cares.  All of the patient's questions were answered with apparent satisfaction. The patient knows to call the clinic with any problems, questions or concerns.    Sullivan Lone MD [place] AAHIVMS [facility] Hematology/Oncology Physician [facility]  (Office):       [phone] (Work cell):  [phone] (Fax):           [phone]

## 2020-05-14 NOTE — Progress Notes (Signed)
HEMATOLOGY/ONCOLOGY CLINIC NOTE  Date of Service:  .05/08/2020  Patient Care Team: [name], MD as PCP - General (Internal Medicine) Sherren Mocha, MD as PCP - Cardiology (Cardiology)  CHIEF COMPLAINTS/PURPOSE OF CONSULTATION:  Chronic Lymphocytic Leukemia  Oncologic History:   Oncology History  Chronic lymphocytic leukemia (CLL), B-cell ([place])  02/14/2015 Initial Diagnosis   Monoclonal B-cell population CD5 and CD20 positive, differential diagnosis CLL versus mantle cell lymphoma ( patient had absolute lymphocyte count of 8000 in 2010), remained stable.   07/09/2018 -  Chemotherapy   The patient had obinutuzumab (GAZYVA) 100 mg in sodium chloride 0.9 % 100 mL (0.9615 mg/mL) chemo infusion, 100 mg, Intravenous, Once, 2 of 6 cycles Administration: 100 mg (07/09/2018), 900 mg (07/10/2018), 1,000 mg (07/31/2018)  for chemotherapy treatment.       HISTORY OF PRESENTING ILLNESS:   Jake Kennedy is a wonderful 70 y.o. male who has been referred to [place] by Dr. [name] for evaluation and management of Chronic Lymphocytic Leukemia. The pt was formerly under the care of my colleague Dr. Nicholas Lose. He is accompanied today by his wife. The pt reports that he is doing well overall.  The pt reports that his course of CLL had been pretty consistent since diagnosis in 2017, until roughly 3 months ago in roughly November 2019. He notes that he has had recent increase in fatigue in the last 3 months, and more acutely in the last 3 weeks. He endorses occasional fevers over the last two weeks during the night, highest at 100.2 or 100.3. He also endorses some night sweats, not every night, some of these being "drenching." He notes that he has been eating well for the last 6 months, however has lost 8 pounds in that interim. He endorses regular exercising until the past month, due to his worsened fatigue, and had previously regularly run upwards of 3 miles and bike for several miles as well.  The  pt notes that he can't smell very well, in the last 2-3 weeks, and his hearing has also reduced in the last week. He senses that his enlarged lymph nodes are pressing on his neck, and characterizes this as similar to when one has an acute cold and congestion. He had acute pain in his right hip a week ago and saw my colleague Sandi Mealy, PA in Symptom Managment. He was given Norco but denies taking this because he didn't want to. He endorses a positional element to his hip pain, which e attributes to the pain being from an enlarged lymph node. He notes that his hip pain had sudden onset on the night of 03/18/18. He notes that his neck lymph nodes have grown in the last 3 weeks to a month. The pt notes that his lymph nodes in his neck, armpits, and groin have been enlarged.  The pt tried an antibiotic course 2 weeks ago with his PCP for a suspected sinus infection, during which his cervical lymph nodes enlarged further. He notes that his neck lymph nodes are somewhat improved today as compared to this. The pt denies frequent infections.   Of note prior to the patient's visit today, pt has had CT N/C/A/P completed on 03/19/18 with results revealing NECK: Multiple enlarged bilateral cervical lymph nodes consistent with the given diagnosis of CLL. The largest node is a right level 3 lymph node measuring 3.4 x 2.8 x 1.2 cm. 2. Degenerative changes of the cervical spine. 3. Hypodense lesion in the right side of  the tongue measuring up to 1.5 cm. This could represent focal infection. Neoplasm is considered less likely. The lesion appears to be superficial along the right lateral aspect of the anterior tongue a. This should be amenable to direct inspection, C/A/P: Marked adenopathy within the chest, abdomen, and pelvis, consistent with active lymphoma/leukemia. 2. Splenomegaly is nonspecific. Splenic involvement cannot be excluded. 3. Right greater than left and basilar predominant peribronchovascular interstitial  thickening and nodularity. Considerations include pulmonary involvement of lymphoma and/or Infection/aspiration. 4. Coronary artery atherosclerosis. Aortic Atherosclerosis. 5. Trace nonspecific pelvic fluid.  Most recent lab results (03/17/18) of CBC w/diff and CMP is as follows: all values are WNL except for WBC at 13.1k, RBC at 2.67, HGB at 10.1, HCT at 31.0, MCV at 116.1, MCH at 37.8, PLT at 103k, ANC at 900, Lymphs abs at 11.1k, BUN at 31, Calcium at 8.7, Total Protein at 6.2, Albumin at 3.4. 03/17/18 LDH at 241  On review of systems, pt reports new fatigue, recent fevers, recent night sweats, recently grown neck lymph nodes, eating well, some weight loss, right groin pain, and denies chills, SOB, ear pain, pain along the spine, frequent infections, flank pain, leg swelling, testicular pain or swelling, and any other symptoms.   Interval History:   Jake Kennedy returns today for management and evaluation of his CLL.  Patient notes he is feeling well and has no acute new symptoms. Labs - no evidence of CLL progression. Recent ct - 3/22-  Stable.  On review of systems, pt denies fevers, chills, night sweats, SOB, unexpected weight loss, new lumps/bumps, leg swelling, rash, testicular swelling and any other symptoms.    MEDICAL HISTORY:  Past Medical History:  Diagnosis Date  . Basal cell carcinoma    moles removed in 2007 and 2012 from L nose and L hip  . BPH (benign prostatic hypertrophy)   . CAD (coronary artery disease), native coronary artery    a. 02/10/2015 95% mid LAD stenosis s/p DES  b. 07/15/16: LHC showed stable non obst dz and patent stent  . CLL (chronic lymphocytic leukemia) ([place])   . Coronary artery disease   . ED (erectile dysfunction)   . Lymphocytosis 02/11/2015   seen by Dr. Lindi Adie 02/11/2015, working up for possible CLL    SURGICAL HISTORY: Past Surgical History:  Procedure Laterality Date  . CARDIAC CATHETERIZATION  02/10/2015  . CARDIAC CATHETERIZATION N/A 02/10/2015    Procedure: Left Heart Cath and Coronary Angiography;  Surgeon: Sherren Mocha, MD;  Location: [facility];  Service: Cardiovascular;  Laterality: N/A;  . CARDIAC CATHETERIZATION N/A 02/10/2015   Procedure: Coronary Stent Intervention;  Surgeon: Sherren Mocha, MD;  Location: [facility];  Service: Cardiovascular;  Laterality: N/A;  . CORONARY STENT PLACEMENT  02/10/2015   DES to LAD  . LAMINECTOMY    . LEFT HEART CATH AND CORONARY ANGIOGRAPHY N/A 07/15/2016   Procedure: Left Heart Cath and Coronary Angiography;  Surgeon: Martinique, Peter M, MD;  Location: [facility];  Service: Cardiovascular;  Laterality: N/A;  . TONSILLECTOMY    . VIDEO BRONCHOSCOPY WITH ENDOBRONCHIAL NAVIGATION N/A 09/16/2018   Procedure: VIDEO BRONCHOSCOPY WITH ENDOBRONCHIAL NAVIGATION AND BIOPIES;  Surgeon: Collene Gobble, MD;  Location: [facility];  Service: Thoracic;  Laterality: N/A;    SOCIAL HISTORY: Social History   Socioeconomic History  . Marital status: Married    Spouse name: Not on file  . Number of children: Not on file  . Years of education: Not  on file  . Highest education level: Not on file  Occupational History  . Not on file  Tobacco Use  . Smoking status: Never Smoker  . Smokeless tobacco: Never Used  Vaping Use  . Vaping Use: Never used  Substance and Sexual Activity  . Alcohol use: Yes    Alcohol/week: 0.0 standard drinks    Comment: occasional  . Drug use: No  . Sexual activity: Not on file  Other Topics Concern  . Not on file  Social History Narrative  . Not on file   Social Determinants of Health   Financial Resource Strain: Not on file  Food Insecurity: Not on file  Transportation Needs: Not on file  Physical Activity: Not on file  Stress: Not on file  Social Connections: Not on file  Intimate Partner Violence: Not on file    FAMILY HISTORY: Family History  Problem Relation Age of Onset  . Diabetes Mother   . Heart failure Mother   . Heart attack Mother         occured in 54s.   . Healthy Sister   . Healthy Brother   . Healthy Maternal Grandmother   . Healthy Maternal Grandfather   . Healthy Paternal Grandmother   . Healthy Paternal Grandfather   . Healthy Brother     ALLERGIES:  is allergic to adhesive [tape].  MEDICATIONS:  Current Outpatient Medications  Medication Sig Dispense Refill  . aspirin 81 MG tablet Take 81 mg by mouth daily.    Marland Kitchen atorvastatin (LIPITOR) 80 MG tablet TAKE 1 TABLET(80 MG) BY MOUTH DAILY AT 6 PM 90 tablet 0  . Multiple Vitamin (MULTIVITAMIN) tablet Take 1 tablet by mouth daily.    . Multiple Vitamins-Minerals (PRESERVISION AREDS PO) Take 2 capsules by mouth daily.    . nitroGLYCERIN (NITROSTAT) 0.4 MG SL tablet PLACE 1 TABLET UNDER THE TONGUE EVERY 5 MINUTES AS NEEDED FOR CHEST PAIN 25 tablet 3  . Omega-3 Fatty Acids (FISH OIL CONCENTRATE PO) Take 1 capsule by mouth daily.      No current facility-administered medications for this visit.    REVIEW OF SYSTEMS:   .10 Point review of Systems was done is negative except as noted above.  PHYSICAL EXAMINATION: ECOG FS:1 - Symptomatic but completely ambulatory  Vitals:   05/08/20 1334  BP: 107/71  Pulse: 66  Resp: 17  Temp: 98 F (36.7 C)  SpO2: 98%   Wt Readings from Last 3 Encounters:  05/08/20 187 lb 3.2 oz (84.9 kg)  04/24/20 187 lb (84.8 kg)  04/24/20 186 lb 3.2 oz (84.5 kg)   Body mass index is 24.04 kg/m.   Marland Kitchen GENERAL:alert, in no acute distress and comfortable SKIN: no acute rashes, no significant lesions EYES: conjunctiva are pink and non-injected, sclera anicteric OROPHARYNX: MMM, no exudates, no oropharyngeal erythema or ulceration NECK: supple, no JVD LYMPH:  no palpable lymphadenopathy in the cervical, axillary or inguinal regions LUNGS: clear to auscultation b/l with normal respiratory effort HEART: regular rate & rhythm ABDOMEN:  normoactive bowel sounds , non tender, not distended. Extremity: no pedal edema PSYCH: alert &  oriented x 3 with fluent speech NEURO: no focal motor/sensory deficits   LABORATORY DATA:  I have reviewed the data as listed  . CBC Latest Ref Rng & Units 05/08/2020 11/01/2019 05/17/2019  WBC 4.0 - 10.5 K/uL 6.3 4.9 4.7  Hemoglobin 13.0 - 17.0 g/dL 16.2 15.5 16.8  Hematocrit 39.0 - 52.0 % 49.2 46.9 50.9  Platelets 150 -  400 K/uL 176 144(L) 157  ANC 700<---600<---1200  . CMP Latest Ref Rng & Units 05/08/2020 11/01/2019 05/17/2019  Glucose 70 - 99 mg/dL 93 108(H) 138(H)  BUN 8 - 23 mg/dL 26(H) 21 22  Creatinine 0.61 - 1.24 mg/dL 0.92 0.85 1.03  Sodium 135 - 145 mmol/L 141 139 141  Potassium 3.5 - 5.1 mmol/L 4.9 4.7 4.7  Chloride 98 - 111 mmol/L 103 106 103  CO2 22 - 32 mmol/L 26 28 28   Calcium 8.9 - 10.3 mg/dL 8.7(L) 8.8(L) 9.0  Total Protein 6.5 - 8.1 g/dL 6.5 5.9(L) 6.5  Total Bilirubin 0.3 - 1.2 mg/dL 0.9 0.8 0.9  Alkaline Phos 38 - 126 U/L 71 71 90  AST 15 - 41 U/L 24 22 21   ALT 0 - 44 U/L 38 39 36  01/04/2019 [place] TYROID (Accession [id number])    04/30/18 Outside Labs:                RADIOGRAPHIC STUDIES: I have personally reviewed the radiological images as listed and agreed with the findings in the report. CT Chest Wo Contrast  Result Date: 04/24/2020 CLINICAL DATA:  Pulmonary nodule. EXAM: CT CHEST WITHOUT CONTRAST TECHNIQUE: Multidetector CT imaging of the chest was performed following the standard protocol without IV contrast. COMPARISON:  04/16/2019 and 03/19/2018. FINDINGS: Cardiovascular: Atherosclerotic calcification of the aorta, aortic valve and coronary arteries. Heart size normal. No pericardial effusion. Mediastinum/Nodes: No pathologically enlarged mediastinal or axillary lymph nodes. Esophagus is unremarkable. Lungs/Pleura: Biapical pleuroparenchymal scarring. 9 mm nodule in the left lower lobe (3/135) unchanged from 04/16/2019 and smaller than on 03/19/2018, consistent with a benign lesion. No suspicious pulmonary nodules. No pleural fluid. Airway is  unremarkable. Upper Abdomen: Subcentimeter low-attenuation lesion in the liver is too small to characterize but is unchanged and therefore likely benign. Visualized portions of the liver, gallbladder, adrenal glands, kidneys, spleen, pancreas, stomach and bowel are otherwise unremarkable. No upper abdominal adenopathy. Musculoskeletal: Degenerative changes in the spine and shoulders. IMPRESSION: 1. 9 mm left lower lobe nodule is unchanged from 04/16/2019 and is smaller than on 03/19/2018, indicative of a benign lesion. 2. Aortic atherosclerosis (ICD10-I70.0). Coronary artery calcification. Electronically Signed   By: Lorin Picket M.D.   On: 04/24/2020 15:37    ASSESSMENT & PLAN:  70 y.o. male with  1. Chronic Lymphocytic Leukemia Initial diagnosis on 02/13/15 Peripheral blood flow cytometry which revealed a Monoclonal B-cell population 09/25/15 Cytogenetics ruled out Mantle cell lymphoma. 12/25/17 FISH study ruled out translocation 11;14 12/30/17 BM Biopsy revealed CLL without evidence of transformation  03/17/18 FISH CLL Prognostic panel revealed an 11q deletion and Trisomy 12  03/19/18 CT N/C/A/P revealed NECK: Multiple enlarged bilateral cervical lymph nodes consistent with the given diagnosis of CLL. The largest node is a right level 3 lymph node measuring 3.4 x 2.8 x 1.2 cm. 2. Degenerative changes of the cervical spine. 3. Hypodense lesion in the right side of the tongue measuring up to 1.5 cm. This could represent focal infection. Neoplasm is considered less likely. The lesion appears to be superficial along the right lateral aspect of the anterior tongue a. This should be amenable to direct inspection, C/A/P: Marked adenopathy within the chest, abdomen, and pelvis, consistent with active lymphoma/leukemia. 2. Splenomegaly is nonspecific. Splenic involvement cannot be excluded. 3. Right greater than left and basilar predominant peribronchovascular interstitial thickening and nodularity.  Considerations include pulmonary involvement of lymphoma and/or Infection/aspiration. 4. Coronary artery atherosclerosis. Aortic Atherosclerosis. 5. Trace nonspecific pelvic fluid.  03/26/18 Left cervical  LN biopsy revealed SLL/CLL  08/11/18 CT Neck revealed "Decreased cervical lymphadenopathy with all remaining nodes being subcentimeter in short axis. 2. Sinusitis."  08/11/18 CT Chest revealed "Interval improvement without resolution of the basilar predominant peripheral peribronchovascular nodularity. 2. Interval development of a new masslike consolidative opacity in the medial right lung measuring up to 3.3 cm. This may be infectious/inflammatory. Lymphoma involvement not excluded. 3. Interval resolution of splenomegaly with small volume perisplenic ascites identified today. 4.  Aortic Atherosclerosis."  04/16/19 CT Chest ([id number]) revealed "1. Overall today's study demonstrates improved appearance of the lungs with resolution of widespread thickening of the peribronchovascular interstitium as well as scattered areas of peribronchovascular micronodularity seen throughout the lungs bilaterally. Mild cylindrical bronchiectasis is again noted. 2. Stable 9 x 8 mm pulmonary nodule in the left lower lobe (axial image 126 of series 3), strongly favored to be benign."  #2 Thyroid nodule- appears benign on Ultrasound. 10/09/2018 US THYROID (Accession [id number]) revealed "1. Solitary 1.2 cm benign colloid containing cyst within the left lobe of the thyroid does not meet imaging criteria to recommend percutaneous sampling or continued dedicated follow-up. 2. Otherwise, normal thyroid ultrasound."   PLAN: -Discussed pt labwork today, blood counts and chemistries are stable. -No lab or clinical evidence of CLL progression/recurrence at this time.  -No indication to treat. Will continue watchful observation every 6 months. -Recommend pt f/u with Pulmonology for lung lesion monitoring -Will see back in 6  months with labs   FOLLOW UP: RTC with Dr Irene Limbo with labs in 6 months   The total time spent in the appt was 20 minutes and more than 50% was on counseling and direct patient cares.  All of the patient's questions were answered with apparent satisfaction. The patient knows to call the clinic with any problems, questions or concerns.    Sullivan Lone MD [place] AAHIVMS [facility] Hematology/Oncology Physician [facility]  (Office):       [phone] (Work cell):  [phone] (Fax):           [phone]

## 2020-06-05 DIAGNOSIS — M79662 Pain in left lower leg: Secondary | ICD-10-CM | POA: Diagnosis not present

## 2020-06-05 DIAGNOSIS — M66869 Spontaneous rupture of other tendons, unspecified lower leg: Secondary | ICD-10-CM | POA: Diagnosis not present

## 2020-06-05 DIAGNOSIS — M25572 Pain in left ankle and joints of left foot: Secondary | ICD-10-CM | POA: Diagnosis not present

## 2020-06-07 ENCOUNTER — Other Ambulatory Visit: Payer: BC Managed Care – PPO

## 2020-06-07 ENCOUNTER — Ambulatory Visit: Payer: BC Managed Care – PPO | Admitting: Hematology

## 2020-06-07 DIAGNOSIS — S86012A Strain of left Achilles tendon, initial encounter: Secondary | ICD-10-CM | POA: Diagnosis not present

## 2020-06-08 ENCOUNTER — Telehealth: Payer: Self-pay | Admitting: *Deleted

## 2020-06-08 NOTE — Telephone Encounter (Signed)
   [facility] Pre-operative Risk Assessment    Patient Name: Jake Kennedy  DOB: 12/31/1950  MRN: [id number]   HEARTCARE STAFF: - Please ensure there is not already an duplicate clearance open for this procedure. - Under Visit Info/Reason for Call, type in Other and utilize the format Clearance MM/DD/YY or Clearance TBD. Do not use dashes or single digits. - If request is for dental extraction, please clarify the # of teeth to be extracted.  Request for surgical clearance: URGENT  1. What type of surgery is being performed? LEFT ACHILLES TENDON REPAI   2. When is this surgery scheduled? 06/13/20   3. What type of clearance is required (medical clearance vs. Pharmacy clearance to hold med vs. Both)? MEDICAL  4. Are there any medications that need to be held prior to surgery and how long? ASA    5. Practice name and name of physician performing surgery? [facility]; DR. Jenny Reichmann Kennedy   6. What is the office phone number? [phone]   7.   What is the office fax number? [phone] ATTN: [place]  8.   Anesthesia type (None, local, MAC, general) ? GENERAL   [name] 06/08/2020, 4:25 PM  _________________________________________________________________   (provider comments below)

## 2020-06-09 NOTE — Telephone Encounter (Signed)
   Name: Jake Kennedy  DOB: October 14, 1950  MRN: [id number]   Primary Cardiologist: Sherren Mocha, MD  Chart reviewed as part of pre-operative protocol coverage. Patient was contacted 06/09/2020 in reference to pre-operative risk assessment for pending surgery as outlined below.  Jake Kennedy was last seen on 04/24/20 by Dr. Burt Knack.  Since that day, Jake Kennedy has done well from a cardiac standpoint. He was playing basketball at the time of his achilles tendon injury. He can easily complete 4 METs without anginal complaints.   Therefore, based on ACC/AHA guidelines, the patient would be at acceptable risk for the planned procedure without further cardiovascular testing.   The patient was advised that if he develops new symptoms prior to surgery to contact our office to arrange for a follow-up visit, and he verbalized understanding.  Per Dr. Burt Knack, patient can hold aspirin 5-7 days prior to his upcoming surgery with plans to restart as soon as he is cleared to do so by his orthopedist.   I will route this recommendation to the requesting party via [place] fax function and remove from pre-op pool. Please call with questions.  Abigail Butts, PA-C 06/09/2020, 2:44 PM

## 2020-06-09 NOTE — Telephone Encounter (Signed)
Ok to hold ASA for surgery as requested. thanks

## 2020-06-13 DIAGNOSIS — S86012A Strain of left Achilles tendon, initial encounter: Secondary | ICD-10-CM | POA: Diagnosis not present

## 2020-06-13 DIAGNOSIS — M66872 Spontaneous rupture of other tendons, left ankle and foot: Secondary | ICD-10-CM | POA: Diagnosis not present

## 2020-06-13 DIAGNOSIS — G8918 Other acute postprocedural pain: Secondary | ICD-10-CM | POA: Diagnosis not present

## 2020-06-28 DIAGNOSIS — S86012D Strain of left Achilles tendon, subsequent encounter: Secondary | ICD-10-CM | POA: Diagnosis not present

## 2020-06-29 DIAGNOSIS — U071 COVID-19: Secondary | ICD-10-CM | POA: Diagnosis not present

## 2020-06-30 DIAGNOSIS — U071 COVID-19: Secondary | ICD-10-CM | POA: Diagnosis not present

## 2020-07-12 DIAGNOSIS — Z4789 Encounter for other orthopedic aftercare: Secondary | ICD-10-CM | POA: Diagnosis not present

## 2020-07-26 ENCOUNTER — Other Ambulatory Visit: Payer: Self-pay | Admitting: Family Medicine

## 2020-07-26 DIAGNOSIS — I2510 Atherosclerotic heart disease of native coronary artery without angina pectoris: Secondary | ICD-10-CM

## 2020-08-25 DIAGNOSIS — S86012A Strain of left Achilles tendon, initial encounter: Secondary | ICD-10-CM | POA: Diagnosis not present

## 2020-08-29 DIAGNOSIS — Z23 Encounter for immunization: Secondary | ICD-10-CM | POA: Diagnosis not present

## 2020-09-01 DIAGNOSIS — S86012A Strain of left Achilles tendon, initial encounter: Secondary | ICD-10-CM | POA: Diagnosis not present

## 2020-09-05 DIAGNOSIS — S86012A Strain of left Achilles tendon, initial encounter: Secondary | ICD-10-CM | POA: Diagnosis not present

## 2020-09-07 DIAGNOSIS — S86012A Strain of left Achilles tendon, initial encounter: Secondary | ICD-10-CM | POA: Diagnosis not present

## 2020-09-12 DIAGNOSIS — S86012A Strain of left Achilles tendon, initial encounter: Secondary | ICD-10-CM | POA: Diagnosis not present

## 2020-09-15 DIAGNOSIS — S86012A Strain of left Achilles tendon, initial encounter: Secondary | ICD-10-CM | POA: Diagnosis not present

## 2020-09-19 DIAGNOSIS — S86012A Strain of left Achilles tendon, initial encounter: Secondary | ICD-10-CM | POA: Diagnosis not present

## 2020-09-20 DIAGNOSIS — Z4789 Encounter for other orthopedic aftercare: Secondary | ICD-10-CM | POA: Diagnosis not present

## 2020-09-21 DIAGNOSIS — Z85828 Personal history of other malignant neoplasm of skin: Secondary | ICD-10-CM | POA: Diagnosis not present

## 2020-09-21 DIAGNOSIS — C44319 Basal cell carcinoma of skin of other parts of face: Secondary | ICD-10-CM | POA: Diagnosis not present

## 2020-09-21 DIAGNOSIS — S86012A Strain of left Achilles tendon, initial encounter: Secondary | ICD-10-CM | POA: Diagnosis not present

## 2020-09-21 DIAGNOSIS — D2262 Melanocytic nevi of left upper limb, including shoulder: Secondary | ICD-10-CM | POA: Diagnosis not present

## 2020-09-21 DIAGNOSIS — C4441 Basal cell carcinoma of skin of scalp and neck: Secondary | ICD-10-CM | POA: Diagnosis not present

## 2020-09-21 DIAGNOSIS — L570 Actinic keratosis: Secondary | ICD-10-CM | POA: Diagnosis not present

## 2020-09-21 DIAGNOSIS — D2261 Melanocytic nevi of right upper limb, including shoulder: Secondary | ICD-10-CM | POA: Diagnosis not present

## 2020-09-27 DIAGNOSIS — S86012A Strain of left Achilles tendon, initial encounter: Secondary | ICD-10-CM | POA: Diagnosis not present

## 2020-09-29 DIAGNOSIS — S86012A Strain of left Achilles tendon, initial encounter: Secondary | ICD-10-CM | POA: Diagnosis not present

## 2020-10-03 DIAGNOSIS — H43813 Vitreous degeneration, bilateral: Secondary | ICD-10-CM | POA: Diagnosis not present

## 2020-10-03 DIAGNOSIS — S86012A Strain of left Achilles tendon, initial encounter: Secondary | ICD-10-CM | POA: Diagnosis not present

## 2020-10-03 DIAGNOSIS — H2513 Age-related nuclear cataract, bilateral: Secondary | ICD-10-CM | POA: Diagnosis not present

## 2020-10-05 DIAGNOSIS — S86012A Strain of left Achilles tendon, initial encounter: Secondary | ICD-10-CM | POA: Diagnosis not present

## 2020-10-10 DIAGNOSIS — S86012A Strain of left Achilles tendon, initial encounter: Secondary | ICD-10-CM | POA: Diagnosis not present

## 2020-10-12 DIAGNOSIS — S86012A Strain of left Achilles tendon, initial encounter: Secondary | ICD-10-CM | POA: Diagnosis not present

## 2020-10-17 DIAGNOSIS — S86012A Strain of left Achilles tendon, initial encounter: Secondary | ICD-10-CM | POA: Diagnosis not present

## 2020-10-19 DIAGNOSIS — S86012A Strain of left Achilles tendon, initial encounter: Secondary | ICD-10-CM | POA: Diagnosis not present

## 2020-11-07 ENCOUNTER — Other Ambulatory Visit: Payer: Self-pay

## 2020-11-07 ENCOUNTER — Inpatient Hospital Stay: Payer: BC Managed Care – PPO | Attending: Hematology

## 2020-11-07 ENCOUNTER — Inpatient Hospital Stay: Payer: BC Managed Care – PPO | Admitting: Hematology

## 2020-11-07 VITALS — BP 111/69 | HR 61 | Temp 98.5°F | Resp 17 | Ht 74.0 in | Wt 186.3 lb

## 2020-11-07 DIAGNOSIS — C9110 Chronic lymphocytic leukemia of B-cell type not having achieved remission: Secondary | ICD-10-CM

## 2020-11-07 DIAGNOSIS — Z85828 Personal history of other malignant neoplasm of skin: Secondary | ICD-10-CM | POA: Diagnosis not present

## 2020-11-07 DIAGNOSIS — Z9221 Personal history of antineoplastic chemotherapy: Secondary | ICD-10-CM | POA: Insufficient documentation

## 2020-11-07 DIAGNOSIS — Z79899 Other long term (current) drug therapy: Secondary | ICD-10-CM | POA: Insufficient documentation

## 2020-11-07 DIAGNOSIS — I700 Atherosclerosis of aorta: Secondary | ICD-10-CM | POA: Diagnosis not present

## 2020-11-07 DIAGNOSIS — N400 Enlarged prostate without lower urinary tract symptoms: Secondary | ICD-10-CM | POA: Insufficient documentation

## 2020-11-07 DIAGNOSIS — I2510 Atherosclerotic heart disease of native coronary artery without angina pectoris: Secondary | ICD-10-CM | POA: Insufficient documentation

## 2020-11-07 DIAGNOSIS — E041 Nontoxic single thyroid nodule: Secondary | ICD-10-CM | POA: Diagnosis not present

## 2020-11-07 DIAGNOSIS — N529 Male erectile dysfunction, unspecified: Secondary | ICD-10-CM | POA: Insufficient documentation

## 2020-11-07 LAB — CBC WITH DIFFERENTIAL (CANCER CENTER ONLY)
Abs Immature Granulocytes: 0.02 10*3/uL (ref 0.00–0.07)
Basophils Absolute: 0 10*3/uL (ref 0.0–0.1)
Basophils Relative: 1 %
Eosinophils Absolute: 0.1 10*3/uL (ref 0.0–0.5)
Eosinophils Relative: 1 %
HCT: 46.1 % (ref 39.0–52.0)
Hemoglobin: 15.2 g/dL (ref 13.0–17.0)
Immature Granulocytes: 0 %
Lymphocytes Relative: 13 %
Lymphs Abs: 0.6 10*3/uL — ABNORMAL LOW (ref 0.7–4.0)
MCH: 30.4 pg (ref 26.0–34.0)
MCHC: 33 g/dL (ref 30.0–36.0)
MCV: 92.2 fL (ref 80.0–100.0)
Monocytes Absolute: 0.7 10*3/uL (ref 0.1–1.0)
Monocytes Relative: 14 %
Neutro Abs: 3.4 10*3/uL (ref 1.7–7.7)
Neutrophils Relative %: 71 %
Platelet Count: 158 10*3/uL (ref 150–400)
RBC: 5 MIL/uL (ref 4.22–5.81)
RDW: 13 % (ref 11.5–15.5)
WBC Count: 4.9 10*3/uL (ref 4.0–10.5)
nRBC: 0 % (ref 0.0–0.2)

## 2020-11-07 LAB — CMP (CANCER CENTER ONLY)
ALT: 33 U/L (ref 0–44)
AST: 22 U/L (ref 15–41)
Albumin: 3.9 g/dL (ref 3.5–5.0)
Alkaline Phosphatase: 67 U/L (ref 38–126)
Anion gap: 8 (ref 5–15)
BUN: 20 mg/dL (ref 8–23)
CO2: 26 mmol/L (ref 22–32)
Calcium: 8.8 mg/dL — ABNORMAL LOW (ref 8.9–10.3)
Chloride: 105 mmol/L (ref 98–111)
Creatinine: 0.88 mg/dL (ref 0.61–1.24)
GFR, Estimated: >60 mL/min (ref >60)
Glucose, Bld: 93 mg/dL (ref 70–99)
Potassium: 4.4 mmol/L (ref 3.5–5.1)
Sodium: 139 mmol/L (ref 135–145)
Total Bilirubin: 0.8 mg/dL (ref 0.3–1.2)
Total Protein: 6 g/dL — ABNORMAL LOW (ref 6.5–8.1)

## 2020-11-07 LAB — LACTATE DEHYDROGENASE: LDH: 168 U/L (ref 98–192)

## 2020-11-08 ENCOUNTER — Telehealth: Payer: Self-pay | Admitting: Hematology

## 2020-11-08 NOTE — Telephone Encounter (Signed)
Scheduled follow-up appointment per 10/4 los. Patient is aware. 

## 2020-11-13 ENCOUNTER — Encounter: Payer: Self-pay | Admitting: Hematology

## 2020-11-13 NOTE — Progress Notes (Signed)
HEMATOLOGY/ONCOLOGY CLINIC NOTE  Date of Service:  .11/07/2020  Patient Care Team: [name], MD as PCP - General (Internal Medicine) Sherren Mocha, MD as PCP - Cardiology (Cardiology)  CHIEF COMPLAINTS/PURPOSE OF CONSULTATION:  Chronic Lymphocytic Leukemia  Oncologic History:   Oncology History  Chronic lymphocytic leukemia (CLL), B-cell ([place])  02/14/2015 Initial Diagnosis   Monoclonal B-cell population CD5 and CD20 positive, differential diagnosis CLL versus mantle cell lymphoma ( patient had absolute lymphocyte count of 8000 in 2010), remained stable.   07/09/2018 -  Chemotherapy   The patient had obinutuzumab (GAZYVA) 100 mg in sodium chloride 0.9 % 100 mL (0.9615 mg/mL) chemo infusion, 100 mg, Intravenous, Once, 2 of 6 cycles Administration: 100 mg (07/09/2018), 900 mg (07/10/2018), 1,000 mg (07/31/2018)   for chemotherapy treatment.        HISTORY OF PRESENTING ILLNESS:   Jake Kennedy is a wonderful 70 y.o. male who has been referred to [place] by Dr. [name] for evaluation and management of Chronic Lymphocytic Leukemia. The pt was formerly under the care of my colleague Dr. Nicholas Lose. He is accompanied today by his wife. The pt reports that he is doing well overall.  The pt reports that his course of CLL had been pretty consistent since diagnosis in 2017, until roughly 3 months ago in roughly November 2019. He notes that he has had recent increase in fatigue in the last 3 months, and more acutely in the last 3 weeks. He endorses occasional fevers over the last two weeks during the night, highest at 100.2 or 100.3. He also endorses some night sweats, not every night, some of these being "drenching." He notes that he has been eating well for the last 6 months, however has lost 8 pounds in that interim. He endorses regular exercising until the past month, due to his worsened fatigue, and had previously regularly run upwards of 3 miles and bike for several miles as  well.  The pt notes that he can't smell very well, in the last 2-3 weeks, and his hearing has also reduced in the last week. He senses that his enlarged lymph nodes are pressing on his neck, and characterizes this as similar to when one has an acute cold and congestion. He had acute pain in his right hip a week ago and saw my colleague Sandi Mealy, PA in Symptom Managment. He was given Norco but denies taking this because he didn't want to. He endorses a positional element to his hip pain, which e attributes to the pain being from an enlarged lymph node. He notes that his hip pain had sudden onset on the night of 03/18/18. He notes that his neck lymph nodes have grown in the last 3 weeks to a month. The pt notes that his lymph nodes in his neck, armpits, and groin have been enlarged.  The pt tried an antibiotic course 2 weeks ago with his PCP for a suspected sinus infection, during which his cervical lymph nodes enlarged further. He notes that his neck lymph nodes are somewhat improved today as compared to this. The pt denies frequent infections.   Of note prior to the patient's visit today, pt has had CT N/C/A/P completed on 03/19/18 with results revealing NECK: Multiple enlarged bilateral cervical lymph nodes consistent with the given diagnosis of CLL. The largest node is a right level 3 lymph node measuring 3.4 x 2.8 x 1.2 cm. 2. Degenerative changes of the cervical spine. 3. Hypodense lesion in the right  side of the tongue measuring up to 1.5 cm. This could represent focal infection. Neoplasm is considered less likely. The lesion appears to be superficial along the right lateral aspect of the anterior tongue a. This should be amenable to direct inspection, C/A/P: Marked adenopathy within the chest, abdomen, and pelvis, consistent with active lymphoma/leukemia. 2. Splenomegaly is nonspecific. Splenic involvement cannot be excluded. 3. Right greater than left and basilar predominant peribronchovascular  interstitial thickening and nodularity. Considerations include pulmonary involvement of lymphoma and/or Infection/aspiration. 4. Coronary artery atherosclerosis. Aortic Atherosclerosis. 5. Trace nonspecific pelvic fluid.  Most recent lab results (03/17/18) of CBC w/diff and CMP is as follows: all values are WNL except for WBC at 13.1k, RBC at 2.67, HGB at 10.1, HCT at 31.0, MCV at 116.1, MCH at 37.8, PLT at 103k, ANC at 900, Lymphs abs at 11.1k, BUN at 31, Calcium at 8.7, Total Protein at 6.2, Albumin at 3.4. 03/17/18 LDH at 241  On review of systems, pt reports new fatigue, recent fevers, recent night sweats, recently grown neck lymph nodes, eating well, some weight loss, right groin pain, and denies chills, SOB, ear pain, pain along the spine, frequent infections, flank pain, leg swelling, testicular pain or swelling, and any other symptoms.   Interval History:   Jake Kennedy returns for continued evaluation and management of his CLL after his last clinic visit 6 months ago on 05/08/2020. He notes no acute new symptoms. No fevers no chills no night sweats no unexpected weight loss.  No significant new lumps or bumps. No new cough chest pain or shortness of breath. Pain or distention. Patient notes he was playing basketball with his grandson and did ruptured his left Achilles tendon and had to undergo repair and rehabilitation for this.  Notes he has been following physical therapy tries to strengthen his calf muscle and tendon again.  Labs done today show CBC which is within normal limits, CMP unremarkable, LDH within normal limits at 168.  On review of system patient notes no other symptoms or signs suggestive of CLL progression/recurrence.   MEDICAL HISTORY:  Past Medical History:  Diagnosis Date   Basal cell carcinoma    moles removed in 2007 and 2012 from L nose and L hip   BPH (benign prostatic hypertrophy)    CAD (coronary artery disease), native coronary artery    a. 02/10/2015 95%  mid LAD stenosis s/p DES  b. 07/15/16: LHC showed stable non obst dz and patent stent   CLL (chronic lymphocytic leukemia) ([place])    Coronary artery disease    ED (erectile dysfunction)    Lymphocytosis 02/11/2015   seen by Dr. Lindi Adie 02/11/2015, working up for possible CLL    SURGICAL HISTORY: Past Surgical History:  Procedure Laterality Date   CARDIAC CATHETERIZATION  02/10/2015   CARDIAC CATHETERIZATION N/A 02/10/2015   Procedure: Left Heart Cath and Coronary Angiography;  Surgeon: Sherren Mocha, MD;  Location: [facility];  Service: Cardiovascular;  Laterality: N/A;   CARDIAC CATHETERIZATION N/A 02/10/2015   Procedure: Coronary Stent Intervention;  Surgeon: Sherren Mocha, MD;  Location: [facility];  Service: Cardiovascular;  Laterality: N/A;   CORONARY STENT PLACEMENT  02/10/2015   DES to LAD   LAMINECTOMY     LEFT HEART CATH AND CORONARY ANGIOGRAPHY N/A 07/15/2016   Procedure: Left Heart Cath and Coronary Angiography;  Surgeon: Martinique, Peter M, MD;  Location: [facility];  Service: Cardiovascular;  Laterality: N/A;   TONSILLECTOMY  VIDEO BRONCHOSCOPY WITH ENDOBRONCHIAL NAVIGATION N/A 09/16/2018   Procedure: VIDEO BRONCHOSCOPY WITH ENDOBRONCHIAL NAVIGATION AND BIOPIES;  Surgeon: Collene Gobble, MD;  Location: [facility];  Service: Thoracic;  Laterality: N/A;    SOCIAL HISTORY: Social History   Socioeconomic History   Marital status: Married    Spouse name: Not on file   Number of children: Not on file   Years of education: Not on file   Highest education level: Not on file  Occupational History   Not on file  Tobacco Use   Smoking status: Never   Smokeless tobacco: Never  Vaping Use   Vaping Use: Never used  Substance and Sexual Activity   Alcohol use: Yes    Alcohol/week: 0.0 standard drinks    Comment: occasional   Drug use: No   Sexual activity: Not on file  Other Topics Concern   Not on file  Social History Narrative   Not on file   Social  Determinants of Health   Financial Resource Strain: Not on file  Food Insecurity: Not on file  Transportation Needs: Not on file  Physical Activity: Not on file  Stress: Not on file  Social Connections: Not on file  Intimate Partner Violence: Not on file    FAMILY HISTORY: Family History  Problem Relation Age of Onset   Diabetes Mother    Heart failure Mother    Heart attack Mother        occured in 62s.    Healthy Sister    Healthy Brother    Healthy Maternal Grandmother    Healthy Maternal Grandfather    Healthy Paternal Grandmother    Healthy Paternal Grandfather    Healthy Brother     ALLERGIES:  is allergic to adhesive [tape].  MEDICATIONS:  Current Outpatient Medications  Medication Sig Dispense Refill   aspirin 81 MG tablet Take 81 mg by mouth daily.     atorvastatin (LIPITOR) 80 MG tablet TAKE 1 TABLET(80 MG) BY MOUTH DAILY AT 6 PM 90 tablet 3   Multiple Vitamin (MULTIVITAMIN) tablet Take 1 tablet by mouth daily.     Multiple Vitamins-Minerals (PRESERVISION AREDS PO) Take 2 capsules by mouth daily.     nitroGLYCERIN (NITROSTAT) 0.4 MG SL tablet PLACE 1 TABLET UNDER THE TONGUE EVERY 5 MINUTES AS NEEDED FOR CHEST PAIN 25 tablet 3   Omega-3 Fatty Acids (FISH OIL CONCENTRATE PO) Take 1 capsule by mouth daily.      sildenafil (VIAGRA) 100 MG tablet sildenafil 100 mg tablet     No current facility-administered medications for this visit.    REVIEW OF SYSTEMS:   .10 Point review of Systems was done is negative except as noted above.  PHYSICAL EXAMINATION: ECOG FS:1 - Symptomatic but completely ambulatory  Vitals:   11/07/20 1345  BP: 111/69  Pulse: 61  Resp: 17  Temp: 98.5 F (36.9 C)  SpO2: 96%   Wt Readings from Last 3 Encounters:  11/07/20 186 lb 4.8 oz (84.5 kg)  05/08/20 187 lb 3.2 oz (84.9 kg)  04/24/20 187 lb (84.8 kg)   Body mass index is 23.92 kg/m.   Marland Kitchen GENERAL:alert, in no acute distress and comfortable SKIN: no acute rashes, no  significant lesions EYES: conjunctiva are pink and non-injected, sclera anicteric OROPHARYNX: MMM, no exudates, no oropharyngeal erythema or ulceration NECK: supple, no JVD LYMPH:  no palpable lymphadenopathy in the cervical, axillary or inguinal regions LUNGS: clear to auscultation b/l with normal respiratory effort HEART: regular rate & rhythm  ABDOMEN:  normoactive bowel sounds , non tender, not distended. Extremity: no pedal edema PSYCH: alert & oriented x 3 with fluent speech NEURO: no focal motor/sensory deficits   LABORATORY DATA:  I have reviewed the data as listed  . CBC Latest Ref Rng & Units 11/07/2020 05/08/2020 11/01/2019  WBC 4.0 - 10.5 K/uL 4.9 6.3 4.9  Hemoglobin 13.0 - 17.0 g/dL 15.2 16.2 15.5  Hematocrit 39.0 - 52.0 % 46.1 49.2 46.9  Platelets 150 - 400 K/uL 158 176 144(L)   . CMP Latest Ref Rng & Units 11/07/2020 05/08/2020 11/01/2019  Glucose 70 - 99 mg/dL 93 93 108(H)  BUN 8 - 23 mg/dL 20 26(H) 21  Creatinine 0.61 - 1.24 mg/dL 0.88 0.92 0.85  Sodium 135 - 145 mmol/L 139 141 139  Potassium 3.5 - 5.1 mmol/L 4.4 4.9 4.7  Chloride 98 - 111 mmol/L 105 103 106  CO2 22 - 32 mmol/L 26 26 28   Calcium 8.9 - 10.3 mg/dL 8.8(L) 8.7(L) 8.8(L)  Total Protein 6.5 - 8.1 g/dL 6.0(L) 6.5 5.9(L)  Total Bilirubin 0.3 - 1.2 mg/dL 0.8 0.9 0.8  Alkaline Phos 38 - 126 U/L 67 71 71  AST 15 - 41 U/L 22 24 22   ALT 0 - 44 U/L 33 38 39  01/04/2019 [place] TH[facility] clinic he says that leg pulls 5 minutes on top of like a lot of other things and so if it is normal on the floor things to create those findings within patient's and he just keep adding layers that they are related to this new system without really correcting it to be with her off of optimization was if there was anything that broke the flow of of design optimization it was needing less of that if it brings on the whole optimization process and so likely just like continuously Gladding the schedule can figure out what the letter is white lab  reporting were not patient is to see if can do the schedulers will do Monoferric level that was already covered and lost, her recovery is working portal any concerns with bloating on the look ROID (Accession [id number])    04/30/18 Outside Labs:                RADIOGRAPHIC STUDIES: I have personally reviewed the radiological images as listed and agreed with the findings in the report. No results found.   ASSESSMENT & PLAN:  70 y.o. male with  1. Chronic Lymphocytic Leukemia Initial diagnosis on 02/13/15 Peripheral blood flow cytometry which revealed a Monoclonal B-cell population 09/25/15 Cytogenetics ruled out Mantle cell lymphoma. 12/25/17 FISH study ruled out translocation (11;14) 12/30/17 BM Biopsy revealed CLL without evidence of transformation  03/17/18 FISH CLL Prognostic panel revealed an 11q deletion and Trisomy 12  03/19/18 CT N/C/A/P revealed NECK: Multiple enlarged bilateral cervical lymph nodes consistent with the given diagnosis of CLL. The largest node is a right level 3 lymph node measuring 3.4 x 2.8 x 1.2 cm. 2. Degenerative changes of the cervical spine. 3. Hypodense lesion in the right side of the tongue measuring up to 1.5 cm. This could represent focal infection. Neoplasm is considered less likely. The lesion appears to be superficial along the right lateral aspect of the anterior tongue a. This should be amenable to direct inspection, C/A/P: Marked adenopathy within the chest, abdomen, and pelvis, consistent with active lymphoma/leukemia. 2. Splenomegaly is nonspecific. Splenic involvement cannot be excluded. 3. Right greater than left and basilar predominant peribronchovascular interstitial thickening and nodularity. Considerations include  pulmonary involvement of lymphoma and/or Infection/aspiration. 4. Coronary artery atherosclerosis. Aortic Atherosclerosis. 5. Trace nonspecific pelvic fluid.  03/26/18 Left cervical LN biopsy revealed SLL/CLL  08/11/18 CT Neck  revealed "Decreased cervical lymphadenopathy with all remaining nodes being subcentimeter in short axis. 2. Sinusitis."  08/11/18 CT Chest revealed "Interval improvement without resolution of the basilar predominant peripheral peribronchovascular nodularity. 2. Interval development of a new masslike consolidative opacity in the medial right lung measuring up to 3.3 cm. This may be infectious/inflammatory. Lymphoma involvement not excluded. 3. Interval resolution of splenomegaly with small volume perisplenic ascites identified today. 4.  Aortic Atherosclerosis."  04/16/19 CT Chest ([id number]) revealed "1. Overall today's study demonstrates improved appearance of the lungs with resolution of widespread thickening of the peribronchovascular interstitium as well as scattered areas of peribronchovascular micronodularity seen throughout the lungs bilaterally. Mild cylindrical bronchiectasis is again noted. 2. Stable 9 x 8 mm pulmonary nodule in the left lower lobe (axial image 126 of series 3), strongly favored to be benign."  #2 Thyroid nodule- appears benign on Ultrasound. 10/09/2018 US THYROID (Accession [id number]) revealed "1. Solitary 1.2 cm benign colloid containing cyst within the left lobe of the thyroid does not meet imaging criteria to recommend percutaneous sampling or continued dedicated follow-up. 2. Otherwise, normal thyroid ultrasound."   PLAN: -Labs done today show CBC which is within normal limits, CMP unremarkable, LDH within normal limits at 168 -Patient has no lab or clinical evidence of CLL recurrence/progression at this time -No indication for additional CLL treatment at this time. -Patient continues to follow with pulmonary to monitor his lung lesion. -Return to clinic with Dr. Irene Limbo with labs in 6 months  FOLLOW UP:  RTC with Dr Irene Limbo with labs in 6 months   The total time spent in the appointment was 20 minutes and more than 50% was on counseling and direct patient cares.  All  of the patient's questions were answered with apparent satisfaction. The patient knows to call the clinic with any problems, questions or concerns.    Sullivan Lone MD [place] AAHIVMS [facility] Hematology/Oncology Physician [facility]

## 2020-12-12 DIAGNOSIS — C44319 Basal cell carcinoma of skin of other parts of face: Secondary | ICD-10-CM | POA: Diagnosis not present

## 2020-12-18 DIAGNOSIS — C490 Malignant neoplasm of connective and soft tissue of head, face and neck: Secondary | ICD-10-CM | POA: Diagnosis not present

## 2020-12-18 DIAGNOSIS — D485 Neoplasm of uncertain behavior of skin: Secondary | ICD-10-CM | POA: Diagnosis not present

## 2020-12-18 DIAGNOSIS — D481 Neoplasm of uncertain behavior of connective and other soft tissue: Secondary | ICD-10-CM | POA: Diagnosis not present

## 2020-12-19 DIAGNOSIS — H43813 Vitreous degeneration, bilateral: Secondary | ICD-10-CM | POA: Diagnosis not present

## 2020-12-19 DIAGNOSIS — H4423 Degenerative myopia, bilateral: Secondary | ICD-10-CM | POA: Diagnosis not present

## 2020-12-19 DIAGNOSIS — H35373 Puckering of macula, bilateral: Secondary | ICD-10-CM | POA: Diagnosis not present

## 2020-12-19 DIAGNOSIS — H43391 Other vitreous opacities, right eye: Secondary | ICD-10-CM | POA: Diagnosis not present

## 2021-01-04 DIAGNOSIS — C44329 Squamous cell carcinoma of skin of other parts of face: Secondary | ICD-10-CM | POA: Diagnosis not present

## 2021-01-16 ENCOUNTER — Telehealth: Payer: Self-pay | Admitting: Emergency Medicine

## 2021-01-17 NOTE — Telephone Encounter (Signed)
Appt scheduled for 04/18/20 @ 10:45 pt aware of appt

## 2021-01-19 DIAGNOSIS — H5210 Myopia, unspecified eye: Secondary | ICD-10-CM | POA: Diagnosis not present

## 2021-04-16 ENCOUNTER — Encounter: Payer: Self-pay | Admitting: Hematology

## 2021-04-18 ENCOUNTER — Ambulatory Visit (INDEPENDENT_AMBULATORY_CARE_PROVIDER_SITE_OTHER)
Admission: RE | Admit: 2021-04-18 | Discharge: 2021-04-18 | Disposition: A | Discharge status: Home / Self Care | Payer: BC Managed Care – PPO | Source: Ambulatory Visit | Attending: Emergency Medicine | Admitting: Emergency Medicine

## 2021-04-18 ENCOUNTER — Encounter: Payer: Self-pay | Admitting: Cardiovascular Disease

## 2021-04-18 ENCOUNTER — Other Ambulatory Visit: Payer: Self-pay

## 2021-04-18 DIAGNOSIS — I700 Atherosclerosis of aorta: Secondary | ICD-10-CM | POA: Diagnosis not present

## 2021-04-18 DIAGNOSIS — R911 Solitary pulmonary nodule: Secondary | ICD-10-CM | POA: Diagnosis not present

## 2021-04-20 NOTE — Telephone Encounter (Signed)
Jake Kennedy - couple of ways we could do this.  We could give him an order for a lipid panel with LFTs to be drawn by hematology with his upcoming labs.  Also, if he is going to have an annual physical in May with labs, he could have them done by Dr Laurann Montana and I can review at that time. Whatever he prefers. Thanks! ?

## 2021-04-24 DIAGNOSIS — D2262 Melanocytic nevi of left upper limb, including shoulder: Secondary | ICD-10-CM | POA: Diagnosis not present

## 2021-04-24 DIAGNOSIS — D1721 Benign lipomatous neoplasm of skin and subcutaneous tissue of right arm: Secondary | ICD-10-CM | POA: Diagnosis not present

## 2021-04-24 DIAGNOSIS — L570 Actinic keratosis: Secondary | ICD-10-CM | POA: Diagnosis not present

## 2021-04-24 DIAGNOSIS — C44612 Basal cell carcinoma of skin of right upper limb, including shoulder: Secondary | ICD-10-CM | POA: Diagnosis not present

## 2021-04-24 DIAGNOSIS — Z85828 Personal history of other malignant neoplasm of skin: Secondary | ICD-10-CM | POA: Diagnosis not present

## 2021-04-24 DIAGNOSIS — D2261 Melanocytic nevi of right upper limb, including shoulder: Secondary | ICD-10-CM | POA: Diagnosis not present

## 2021-04-26 ENCOUNTER — Ambulatory Visit: Payer: BC Managed Care – PPO | Admitting: Emergency Medicine

## 2021-04-26 ENCOUNTER — Other Ambulatory Visit: Payer: Self-pay

## 2021-04-26 ENCOUNTER — Encounter: Payer: Self-pay | Admitting: Emergency Medicine

## 2021-04-26 DIAGNOSIS — R9389 Abnormal findings on diagnostic imaging of other specified body structures: Secondary | ICD-10-CM

## 2021-04-26 DIAGNOSIS — R053 Chronic cough: Secondary | ICD-10-CM | POA: Diagnosis not present

## 2021-04-26 NOTE — Assessment & Plan Note (Signed)
Left lower lobe pulmonary nodule, 9 mm, unchanged.  We have stability over several years and reassuring bronchoscopy.  I do not think he needs any repeat imaging.  Nodule can be considered benign ?

## 2021-04-26 NOTE — Assessment & Plan Note (Signed)
Significantly improved.  He does still have some scant nasal congestion and mucus but has not required therapy.  I do not see any significant airways inflammation, bronchiectatic change on CT scan from 3/15.  Plan to continue to follow ?

## 2021-04-26 NOTE — Progress Notes (Signed)
? ?Subjective:  ? ? Patient ID: Jake Kennedy, male    DOB: August 19, 1950, 71 y.o.   MRN: [id number] ? ?HPI ?   ?ROV 04/23/19 --71 year old never smoker with CLL followed for an abnormal CT scan of the chest with bronchiectatic change and some subpleural micronodular disease.  Underwent reassuring bronchoscopy 09/2018.  We repeated his CT on 04/16/2019 and I have reviewed, this shows overall improvement with less peribronchovascular thickening and micronodularity.  He has mild cylindrical bronchiectasis.  There is a stable 9.8 mm left lower lobe nodule.  His cough is in the am, clears mucous and then is not bothered through the day. He stopped nasacort, mucinex. He stopped flutter valve for now. He is using nasal saline. He has had his covid vaccines.  ?MDM: ?-Reviewed CT scan of the chest 04/16/2019 ?-Reviewed office notes from Dr. Irene Limbo 01/15/19 and Dr Lucia Gaskins 02/10/19 ? ?ROV 04/24/20 --this follow-up visit 71 year old never smoker with a history of CLL and micronodular disease and bronchiectatic change on CT scan of the chest.  We have been following him clinically and with serial CT scans of the chest.  He is not on any current bronchodilator therapy.  He does not have dyspnea.  He does have some cough with clear mucus that he reports is better.  ?He feels great, good exercise tolerance, minimal cough.  ? ?He underwent CT scan of the chest today which I have reviewed, shows minimal bronchiectatic change, no new infiltrates or nodules.  His left lower lobe pulmonary nodule is stable, possibly a little bit smaller than last year ? ?MDM: ?Reviewed oncology note from Dr.Kale 11/01/2019 ? ?ROV 04/26/21 --71 year old gentleman who is followed by Dr. Irene Limbo for CLL.  He has a history of subtle bronchiectatic change and micronodular disease on CT scans of the chest.  We have followed him with serial CT scans and his left lower lobe pulmonary nodule was stable to slightly smaller 1 year ago.  He returns after repeat scan.  He is also  dealt with chronic cough in the past, allergic rhinitis.  Today he reports he is feeling well. He does still have some am cough. Some nasal mucous and needs to cough a small amt up most mornings.  ?No SOB. He did rupture his achilles since last time - improved now.  ? ?CT chest 04/18/2021 reviewed by me, shows no change in the 9 mm medial left lower lobe pulmonary nodule.  No other new nodules.  No mediastinal or hilar adenopathy.  No significant change in airways inflammation or bronchiectasis. ? ?Review of Systems  ?Constitutional:  Negative for fatigue, fever and unexpected weight change.  ?HENT:  Positive for congestion. Negative for dental problem, ear pain, nosebleeds, postnasal drip, rhinorrhea, sinus pressure, sneezing, sore throat and trouble swallowing.   ?Eyes:  Negative for redness and itching.  ?Respiratory:  Positive for cough. Negative for chest tightness, shortness of breath and wheezing.   ?Cardiovascular:  Negative for palpitations and leg swelling.  ?Gastrointestinal:  Negative for nausea and vomiting.  ?Genitourinary:  Negative for dysuria.  ?Musculoskeletal:  Negative for joint swelling.  ?Skin:  Negative for rash.  ?Neurological:  Negative for headaches.  ?Hematological:  Does not bruise/bleed easily.  ?Psychiatric/Behavioral:  Negative for dysphoric mood. The patient is not nervous/anxious.   ? ?   ?Objective:  ? Physical Exam ?Vitals:  ? 04/26/21 0856  ?BP: 122/68  ?Pulse: 70  ?Temp: 98.3 ?F (36.8 ?C)  ?TempSrc: Oral  ?SpO2: 96%  ?Weight: 184 lb  6.4 oz (83.6 kg)  ?Height: '6\' 2"'$  (1.88 m)  ? ?Gen: Pleasant, tall and thin, in no distress,  normal affect ? ?ENT: No lesions,  mouth clear,  oropharynx clear, no postnasal drip ? ?Neck: No JVD, no stridor ? ?Lungs: No use of accessory muscles, no crackles or wheezing on normal respiration, no wheeze on forced expiration ? ?Cardiovascular: RRR, heart sounds normal, no murmur or gallops, no peripheral edema ? ?Musculoskeletal: No deformities, no  cyanosis or clubbing ? ?Neuro: alert, awake, non focal ? ?Skin: Warm, no lesions or rash ? ? ?   ?Assessment & Plan:  ?Chronic cough ?Significantly improved.  He does still have some scant nasal congestion and mucus but has not required therapy.  I do not see any significant airways inflammation, bronchiectatic change on CT scan from 3/15.  Plan to continue to follow ? ?Abnormal CT of the chest ?Left lower lobe pulmonary nodule, 9 mm, unchanged.  We have stability over several years and reassuring bronchoscopy.  I do not think he needs any repeat imaging.  Nodule can be considered benign ? ?[name], MD, PhD ?04/26/2021, 9:16 AM ? Pulmonary and Critical Care ?[phone] or if no answer [phone] ? ?

## 2021-04-26 NOTE — Patient Instructions (Addendum)
We reviewed your CT scan of the chest today.  Pulmonary nodule is unchanged and benign.  We do not need to do any more dedicated CTs unless you develop new symptoms or problems. ?Please let us know if you have any change in cough, congestion, respiratory symptoms. ?Follow with Dr. Lamonte Sakai as needed ?

## 2021-05-02 DIAGNOSIS — H5210 Myopia, unspecified eye: Secondary | ICD-10-CM | POA: Diagnosis not present

## 2021-05-07 ENCOUNTER — Other Ambulatory Visit: Payer: Self-pay

## 2021-05-07 DIAGNOSIS — C9110 Chronic lymphocytic leukemia of B-cell type not having achieved remission: Secondary | ICD-10-CM

## 2021-05-08 ENCOUNTER — Inpatient Hospital Stay: Payer: BC Managed Care – PPO

## 2021-05-08 ENCOUNTER — Other Ambulatory Visit: Payer: Self-pay

## 2021-05-08 ENCOUNTER — Inpatient Hospital Stay: Payer: BC Managed Care – PPO | Attending: Hematology | Admitting: Hematology

## 2021-05-08 VITALS — BP 103/70 | HR 67 | Temp 97.2°F | Resp 20 | Wt 184.4 lb

## 2021-05-08 DIAGNOSIS — C9110 Chronic lymphocytic leukemia of B-cell type not having achieved remission: Secondary | ICD-10-CM

## 2021-05-08 LAB — CMP (CANCER CENTER ONLY)
ALT: 34 U/L (ref 0–44)
AST: 23 U/L (ref 15–41)
Albumin: 4.2 g/dL (ref 3.5–5.0)
Alkaline Phosphatase: 56 U/L (ref 38–126)
Anion gap: 6 (ref 5–15)
BUN: 25 mg/dL — ABNORMAL HIGH (ref 8–23)
CO2: 29 mmol/L (ref 22–32)
Calcium: 8.9 mg/dL (ref 8.9–10.3)
Chloride: 105 mmol/L (ref 98–111)
Creatinine: 0.86 mg/dL (ref 0.61–1.24)
GFR, Estimated: >60 mL/min (ref >60)
Glucose, Bld: 109 mg/dL — ABNORMAL HIGH (ref 70–99)
Potassium: 4.4 mmol/L (ref 3.5–5.1)
Sodium: 140 mmol/L (ref 135–145)
Total Bilirubin: 0.8 mg/dL (ref 0.3–1.2)
Total Protein: 6.2 g/dL — ABNORMAL LOW (ref 6.5–8.1)

## 2021-05-08 LAB — CBC WITH DIFFERENTIAL (CANCER CENTER ONLY)
Abs Immature Granulocytes: 0.01 10*3/uL (ref 0.00–0.07)
Basophils Absolute: 0 10*3/uL (ref 0.0–0.1)
Basophils Relative: 1 %
Eosinophils Absolute: 0.1 10*3/uL (ref 0.0–0.5)
Eosinophils Relative: 3 %
HCT: 47.7 % (ref 39.0–52.0)
Hemoglobin: 16.1 g/dL (ref 13.0–17.0)
Immature Granulocytes: 0 %
Lymphocytes Relative: 17 %
Lymphs Abs: 0.8 10*3/uL (ref 0.7–4.0)
MCH: 30.8 pg (ref 26.0–34.0)
MCHC: 33.8 g/dL (ref 30.0–36.0)
MCV: 91.4 fL (ref 80.0–100.0)
Monocytes Absolute: 0.5 10*3/uL (ref 0.1–1.0)
Monocytes Relative: 10 %
Neutro Abs: 3.5 10*3/uL (ref 1.7–7.7)
Neutrophils Relative %: 69 %
Platelet Count: 156 10*3/uL (ref 150–400)
RBC: 5.22 MIL/uL (ref 4.22–5.81)
RDW: 12.9 % (ref 11.5–15.5)
WBC Count: 5 10*3/uL (ref 4.0–10.5)
nRBC: 0 % (ref 0.0–0.2)

## 2021-05-08 LAB — LACTATE DEHYDROGENASE: LDH: 150 U/L (ref 98–192)

## 2021-05-08 NOTE — Progress Notes (Signed)
? ? ?HEMATOLOGY/ONCOLOGY CLINIC NOTE ? ?Date of Service:  05/08/2021 ? ?Patient Care Team: ?[name], MD as PCP - General (Internal Medicine) ?Sherren Mocha, MD as PCP - Cardiology (Cardiology) ? ?CHIEF COMPLAINTS/PURPOSE OF CONSULTATION:  ?Chronic Lymphocytic Leukemia ? ?Oncologic History:  ? ?Oncology History  ?Chronic lymphocytic leukemia (CLL), B-cell ([facility])  ?02/14/2015 Initial Diagnosis  ? Monoclonal B-cell population CD5 and CD20 positive, differential diagnosis CLL versus mantle cell lymphoma ( patient had absolute lymphocyte count of 8000 in 2010), remained stable. ?  ?07/09/2018 -  Chemotherapy  ? The patient had obinutuzumab (GAZYVA) 100 mg in sodium chloride 0.9 % 100 mL (0.9615 mg/mL) chemo infusion, 100 mg, Intravenous, Once, 2 of 6 cycles ?Administration: 100 mg (07/09/2018), 900 mg (07/10/2018), 1,000 mg (07/31/2018) ? ? for chemotherapy treatment.  ? ?  ? ? ? ?HISTORY OF PRESENTING ILLNESS:  ?Please see previous note for details on initial presentation ? ?Interval History:  ? ?YOUSSOUF SHIPLEY returns for continued evaluation and management of his CLL after his last clinic visit was 04/16/2021. He reports He is doing well with no new symptoms or concerns. ? ?He reports that his ruptured achilles tendon has resolved. ? ?He notes some nasal drainage that occurs in the morning. We discussed some potential lifestyle choices to help manage it. ? ?He reports he had two basal cell carcinomas removed in the last 6 months. ? ?We discussed his previously evaluated tongue cyst and it was found to be benign and he is not inclined to pursue surgical intervention or second consultation by ENT at this time. ? ?Labs done today show CBC which is within normal limits, CMP unremarkable, LDH within normal limits at 150. ? ?No new fatigue ?No fever, chills, night sweats. ?No new lumps, bumps, or lesions/rashes. ?No abdominal pain or change in bowel habits. ?No new or unexpected weight loss. ?No SOB or chest pain. ?No other  new or acute focal symptoms. ? ?On review of system patient notes no other symptoms or signs suggestive of CLL progression/recurrence. ? ?MEDICAL HISTORY:  ?Past Medical History:  ?Diagnosis Date  ? Basal cell carcinoma   ? moles removed in 2007 and 2012 from L nose and L hip  ? BPH (benign prostatic hypertrophy)   ? CAD (coronary artery disease), native coronary artery   ? a. 02/10/2015 95% mid LAD stenosis s/p DES  b. 07/15/16: LHC showed stable non obst dz and patent stent  ? CLL (chronic lymphocytic leukemia) ([place])   ? Coronary artery disease   ? ED (erectile dysfunction)   ? Lymphocytosis 02/11/2015  ? seen by Dr. Lindi Adie 02/11/2015, working up for possible CLL  ? ? ?SURGICAL HISTORY: ?Past Surgical History:  ?Procedure Laterality Date  ? CARDIAC CATHETERIZATION  02/10/2015  ? CARDIAC CATHETERIZATION N/A 02/10/2015  ? Procedure: Left Heart Cath and Coronary Angiography;  Surgeon: Sherren Mocha, MD;  Location: [facility];  Service: Cardiovascular;  Laterality: N/A;  ? CARDIAC CATHETERIZATION N/A 02/10/2015  ? Procedure: Coronary Stent Intervention;  Surgeon: Sherren Mocha, MD;  Location: [facility];  Service: Cardiovascular;  Laterality: N/A;  ? CORONARY STENT PLACEMENT  02/10/2015  ? DES to LAD  ? LAMINECTOMY    ? LEFT HEART CATH AND CORONARY ANGIOGRAPHY N/A 07/15/2016  ? Procedure: Left Heart Cath and Coronary Angiography;  Surgeon: Martinique, Peter M, MD;  Location: [facility];  Service: Cardiovascular;  Laterality: N/A;  ? TONSILLECTOMY    ? VIDEO BRONCHOSCOPY WITH ENDOBRONCHIAL NAVIGATION N/A 09/16/2018  ?  Procedure: VIDEO BRONCHOSCOPY WITH ENDOBRONCHIAL NAVIGATION AND BIOPIES;  Surgeon: Collene Gobble, MD;  Location: [facility];  Service: Thoracic;  Laterality: N/A;  ? ? ?SOCIAL HISTORY: ?Social History  ? ?Socioeconomic History  ? Marital status: Married  ?  Spouse name: Not on file  ? Number of children: Not on file  ? Years of education: Not on file  ? Highest education level: Not on file   ?Occupational History  ? Not on file  ?Tobacco Use  ? Smoking status: Never  ? Smokeless tobacco: Never  ?Vaping Use  ? Vaping Use: Never used  ?Substance and Sexual Activity  ? Alcohol use: Yes  ?  Alcohol/week: 0.0 standard drinks  ?  Comment: occasional  ? Drug use: No  ? Sexual activity: Not on file  ?Other Topics Concern  ? Not on file  ?Social History Narrative  ? Not on file  ? ?Social Determinants of Health  ? ?Financial Resource Strain: Not on file  ?Food Insecurity: Not on file  ?Transportation Needs: Not on file  ?Physical Activity: Not on file  ?Stress: Not on file  ?Social Connections: Not on file  ?Intimate Partner Violence: Not on file  ? ? ?FAMILY HISTORY: ?Family History  ?Problem Relation Age of Onset  ? Diabetes Mother   ? Heart failure Mother   ? Heart attack Mother   ?     occured in 64s.   ? Healthy Sister   ? Healthy Brother   ? Healthy Maternal Grandmother   ? Healthy Maternal Grandfather   ? Healthy Paternal Grandmother   ? Healthy Paternal Grandfather   ? Healthy Brother   ? ? ?ALLERGIES:  is allergic to adhesive [tape]. ? ?MEDICATIONS:  ?Current Outpatient Medications  ?Medication Sig Dispense Refill  ? aspirin 81 MG tablet Take 81 mg by mouth daily.    ? atorvastatin (LIPITOR) 80 MG tablet TAKE 1 TABLET(80 MG) BY MOUTH DAILY AT 6 PM 90 tablet 3  ? Multiple Vitamin (MULTIVITAMIN) tablet Take 1 tablet by mouth daily.    ? Multiple Vitamins-Minerals (PRESERVISION AREDS PO) Take 2 capsules by mouth daily.    ? nitroGLYCERIN (NITROSTAT) 0.4 MG SL tablet PLACE 1 TABLET UNDER THE TONGUE EVERY 5 MINUTES AS NEEDED FOR CHEST PAIN 25 tablet 3  ? Omega-3 Fatty Acids (FISH OIL CONCENTRATE PO) Take 1 capsule by mouth daily.     ? sildenafil (VIAGRA) 100 MG tablet sildenafil 100 mg tablet    ? ?No current facility-administered medications for this visit.  ? ? ?REVIEW OF SYSTEMS:   ?.10 Point review of Systems was done is negative except as noted above. ? ?PHYSICAL EXAMINATION: ?ECOG FS:1 - Symptomatic  but completely ambulatory ? ?Vitals:  ? 05/08/21 1340  ?BP: 103/70  ?Pulse: 67  ?Resp: 20  ?Temp: (!) 97.2 ?F (36.2 ?C)  ?SpO2: 99%  ? ?Wt Readings from Last 3 Encounters:  ?05/08/21 184 lb 6.4 oz (83.6 kg)  ?04/26/21 184 lb 6.4 oz (83.6 kg)  ?11/07/20 186 lb 4.8 oz (84.5 kg)  ? ?Body mass index is 23.68 kg/m?Marland Kitchen   ?NAD ?GENERAL:alert, in no acute distress and comfortable ?SKIN: no acute rashes, no significant lesions ?EYES: conjunctiva are pink and non-injected, sclera anicteric ?OROPHARYNX: MMM, no exudates, no oropharyngeal erythema or ulceration ?NECK: supple, no JVD ?LYMPH:  no palpable lymphadenopathy in the cervical, axillary or inguinal regions ?LUNGS: clear to auscultation b/l with normal respiratory effort ?HEART: regular rate & rhythm ?ABDOMEN:  normoactive bowel sounds ,  non tender, not distended. ?Extremity: no pedal edema ?PSYCH: alert & oriented x 3 with fluent speech ?NEURO: no focal motor/sensory deficits ? ?LABORATORY DATA:  ?I have reviewed the data as listed ? ?. ? ?  Latest Ref Rng & Units 05/08/2021  ?  1:30 PM 11/07/2020  ?  1:26 PM 05/08/2020  ?  1:04 PM  ?CBC  ?WBC 4.0 - 10.5 K/uL 5.0   4.9   6.3    ?Hemoglobin 13.0 - 17.0 g/dL 16.1   15.2   16.2    ?Hematocrit 39.0 - 52.0 % 47.7   46.1   49.2    ?Platelets 150 - 400 K/uL 156   158   176    ? ?. ? ?  Latest Ref Rng & Units 11/07/2020  ?  1:26 PM 05/08/2020  ?  1:04 PM 11/01/2019  ?  1:11 PM  ?CMP  ?Glucose 70 - 99 mg/dL 93   93   108    ?BUN 8 - 23 mg/dL '20   26   21    '$ ?Creatinine 0.61 - 1.24 mg/dL 0.88   0.92   0.85    ?Sodium 135 - 145 mmol/L 139   141   139    ?Potassium 3.5 - 5.1 mmol/L 4.4   4.9   4.7    ?Chloride 98 - 111 mmol/L 105   103   106    ?CO2 22 - 32 mmol/L '26   26   28    '$ ?Calcium 8.9 - 10.3 mg/dL 8.8   8.7   8.8    ?Total Protein 6.5 - 8.1 g/dL 6.0   6.5   5.9    ?Total Bilirubin 0.3 - 1.2 mg/dL 0.8   0.9   0.8    ?Alkaline Phos 38 - 126 U/L 67   71   71    ?AST 15 - 41 U/L '22   24   22    '$ ?ALT 0 - 44 U/L 33   38   39    ?01/04/2019  [place] [facility] clinic he says that leg pulls 5 minutes on top of like a lot of other things and so if it is normal on the floor things to create those findings within patient's and he just keep adding layers that they a

## 2021-05-10 ENCOUNTER — Telehealth: Payer: Self-pay | Admitting: Hematology

## 2021-05-10 NOTE — Telephone Encounter (Signed)
Scheduled follow-up appointment per 4/4 los. Patient is aware. 

## 2021-05-11 ENCOUNTER — Ambulatory Visit: Payer: BC Managed Care – PPO | Admitting: Cardiovascular Disease

## 2021-05-11 ENCOUNTER — Encounter: Payer: Self-pay | Admitting: Cardiovascular Disease

## 2021-05-11 VITALS — BP 110/80 | HR 60 | Ht 74.0 in | Wt 184.8 lb

## 2021-05-11 DIAGNOSIS — I2510 Atherosclerotic heart disease of native coronary artery without angina pectoris: Secondary | ICD-10-CM

## 2021-05-11 DIAGNOSIS — E782 Mixed hyperlipidemia: Secondary | ICD-10-CM

## 2021-05-11 NOTE — Progress Notes (Signed)
?Cardiology Office Note:   ? ?Date:  05/11/2021  ? ?ID:  Jake Kennedy, DOB 03-16-1950, MRN [id number] ? ?PCP:  [name], MD ?  ?[facility] Providers ?Cardiologist:  Sherren Mocha, MD    ? ?Referring MD: [name], MD  ? ?Chief Complaint  ?Patient presents with  ? Coronary Artery Disease  ? ? ?History of Present Illness:   ? ?Jake Kennedy is a 71 y.o. male with a hx of coronary artery disease, presenting for follow-up evaluation.  The patient presented in 2016 with exertional angina.  He had a nuclear stress test that demonstrated severe ischemia.  Cardiac catheterization demonstrated moderate RCA stenosis and critical proximal LAD stenosis.  He was treated with PCI of the LAD with a drug-eluting stent.  He had recurrent angina the following year and cardiac catheterization demonstrated patency at his stent site and no change in the moderate nonobstructive RCA stenosis.  Ongoing medical therapy was recommended. ? ?The patient is here alone today.  He has had a recent checkup at the cancer center and has had a good report there.  He does not require any specific therapy at this time for his CLL.  He is doing well from a cardiac perspective and denies any chest pain, chest pressure, or shortness of breath.  No leg swelling, heart palpitations, orthopnea, or PND.  Last year he was playing basketball with his grandson and had an Achilles tear requiring surgery.  He has no other interval complaints and he has had a good recovery from his Achilles surgery. ? ?Past Medical History:  ?Diagnosis Date  ? Basal cell carcinoma   ? moles removed in 2007 and 2012 from L nose and L hip  ? BPH (benign prostatic hypertrophy)   ? CAD (coronary artery disease), native coronary artery   ? a. 02/10/2015 95% mid LAD stenosis s/p DES  b. 07/15/16: LHC showed stable non obst dz and patent stent  ? CLL (chronic lymphocytic leukemia) ([place])   ? Coronary artery disease   ? ED (erectile dysfunction)   ? Lymphocytosis 02/11/2015  ? seen  by Dr. Lindi Adie 02/11/2015, working up for possible CLL  ? ? ?Past Surgical History:  ?Procedure Laterality Date  ? CARDIAC CATHETERIZATION  02/10/2015  ? CARDIAC CATHETERIZATION N/A 02/10/2015  ? Procedure: Left Heart Cath and Coronary Angiography;  Surgeon: Sherren Mocha, MD;  Location: [facility];  Service: Cardiovascular;  Laterality: N/A;  ? CARDIAC CATHETERIZATION N/A 02/10/2015  ? Procedure: Coronary Stent Intervention;  Surgeon: Sherren Mocha, MD;  Location: [facility];  Service: Cardiovascular;  Laterality: N/A;  ? CORONARY STENT PLACEMENT  02/10/2015  ? DES to LAD  ? LAMINECTOMY    ? LEFT HEART CATH AND CORONARY ANGIOGRAPHY N/A 07/15/2016  ? Procedure: Left Heart Cath and Coronary Angiography;  Surgeon: Martinique, Peter M, MD;  Location: [facility];  Service: Cardiovascular;  Laterality: N/A;  ? TONSILLECTOMY    ? VIDEO BRONCHOSCOPY WITH ENDOBRONCHIAL NAVIGATION N/A 09/16/2018  ? Procedure: VIDEO BRONCHOSCOPY WITH ENDOBRONCHIAL NAVIGATION AND BIOPIES;  Surgeon: Collene Gobble, MD;  Location: [facility];  Service: Thoracic;  Laterality: N/A;  ? ? ?Current Medications: ?Current Meds  ?Medication Sig  ? aspirin 81 MG tablet Take 81 mg by mouth daily.  ? atorvastatin (LIPITOR) 80 MG tablet TAKE 1 TABLET(80 MG) BY MOUTH DAILY AT 6 PM  ? Multiple Vitamin (MULTIVITAMIN) tablet Take 1 tablet by mouth daily.  ? Multiple Vitamins-Minerals (PRESERVISION AREDS PO) Take  2 capsules by mouth daily.  ? nitroGLYCERIN (NITROSTAT) 0.4 MG SL tablet PLACE 1 TABLET UNDER THE TONGUE EVERY 5 MINUTES AS NEEDED FOR CHEST PAIN  ? Omega-3 Fatty Acids (FISH OIL CONCENTRATE PO) Take 1 capsule by mouth daily.   ? sildenafil (VIAGRA) 100 MG tablet sildenafil 100 mg tablet  ?  ? ?Allergies:   Adhesive [tape]  ? ?Social History  ? ?Socioeconomic History  ? Marital status: Married  ?  Spouse name: Not on file  ? Number of children: Not on file  ? Years of education: Not on file  ? Highest education level: Not on file  ?Occupational  History  ? Not on file  ?Tobacco Use  ? Smoking status: Never  ? Smokeless tobacco: Never  ?Vaping Use  ? Vaping Use: Never used  ?Substance and Sexual Activity  ? Alcohol use: Yes  ?  Alcohol/week: 0.0 standard drinks  ?  Comment: occasional  ? Drug use: No  ? Sexual activity: Not on file  ?Other Topics Concern  ? Not on file  ?Social History Narrative  ? Not on file  ? ?Social Determinants of Health  ? ?Financial Resource Strain: Not on file  ?Food Insecurity: Not on file  ?Transportation Needs: Not on file  ?Physical Activity: Not on file  ?Stress: Not on file  ?Social Connections: Not on file  ?  ? ?Family History: ?The patient's family history includes Diabetes in his mother; Healthy in his brother, brother, maternal grandfather, maternal grandmother, paternal grandfather, paternal grandmother, and sister; Heart attack in his mother; Heart failure in his mother. ? ?ROS:   ?Please see the history of present illness.    ?All other systems reviewed and are negative. ? ?EKGs/Labs/Other Studies Reviewed:   ? ?The following studies were reviewed today: ?CT Chest 04/18/21: ?Cardiovascular: Aortic atherosclerosis. Normal heart size. Left and ?right coronary artery calcifications and stents. No pericardial ?effusion. ?  ?Mediastinum/Nodes: No enlarged mediastinal, hilar, or axillary lymph ?nodes. Thyroid gland, trachea, and esophagus demonstrate no ?significant findings. ?  ?Lungs/Pleura: Stable, benign 0.9 cm nodule of the medial left lung ?base (series 3, image 129). No pleural effusion or pneumothorax. ?  ?Musculoskeletal: No chest wall mass or suspicious osseous lesions ?identified. ?  ?CT ABDOMEN PELVIS FINDINGS ?  ?Hepatobiliary: No solid liver abnormality is seen. No gallstones, ?gallbladder wall thickening, or biliary dilatation. ?  ?Pancreas: Unremarkable. No pancreatic ductal dilatation or ?surrounding inflammatory changes. ?  ?Spleen: Normal in size without significant abnormality. ?  ?Adrenals/Urinary Tract:  Adrenal glands are unremarkable. Kidneys are ?normal, without renal calculi, solid lesion, or hydronephrosis. ?Bladder is unremarkable. ?  ?Stomach/Bowel: Stomach is within normal limits. Appendix appears ?normal. No evidence of bowel wall thickening, distention, or ?inflammatory changes. ?  ?Vascular/Lymphatic: No significant vascular findings are present. No ?enlarged abdominal or pelvic lymph nodes. ?  ?Reproductive: No mass or other abnormality. ?  ?Other: No abdominal wall hernia or abnormality. No ascites. ?  ?Musculoskeletal: No acute osseous findings. ?  ?IMPRESSION: ?1. Stable, benign 0.9 cm nodule of the medial left lung base, for ?which no further follow-up is required. ?2. Coronary artery disease. ?  ?Aortic Atherosclerosis (ICD10-I70.0). ?  ? ?EKG:  EKG is ordered today.  The ekg ordered today demonstrates NSR 60 bpm, left axis deviation, no change from prior  ? ?Recent Labs: ?05/08/2021: ALT 34; BUN 25; Creatinine 0.86; Hemoglobin 16.1; Platelet Count 156; Potassium 4.4; Sodium 140  ?Recent Lipid Panel ?   ?Component Value Date/Time  ?  CHOL 117 10/16/2018 1125  ? CHOL 83 (L) 11/11/2017 1010  ? TRIG 69 10/16/2018 1125  ? HDL 35 (L) 10/16/2018 1125  ? HDL 23 (L) 11/11/2017 1010  ? CHOLHDL 3.3 10/16/2018 1125  ? VLDL 14 10/16/2018 1125  ? [place] 68 10/16/2018 1125  ? LDLCALC 45 11/11/2017 1010  ? ? ? ?Risk Assessment/Calculations:   ?  ? ?    ? ?Physical Exam:   ? ?VS:  BP 110/80   Pulse 60   Ht '6\' 2"'$  (1.88 m)   Wt 184 lb 12.8 oz (83.8 kg)   SpO2 97%   BMI 23.73 kg/m?    ? ?Wt Readings from Last 3 Encounters:  ?05/11/21 184 lb 12.8 oz (83.8 kg)  ?05/08/21 184 lb 6.4 oz (83.6 kg)  ?04/26/21 184 lb 6.4 oz (83.6 kg)  ?  ? ?GEN:  Well nourished, well developed in no acute distress ?HEENT: Normal ?NECK: No JVD; No carotid bruits ?LYMPHATICS: No lymphadenopathy ?CARDIAC: RRR, no murmurs, rubs, gallops ?RESPIRATORY:  Clear to auscultation without rales, wheezing or rhonchi  ?ABDOMEN: Soft, non-tender,  non-distended ?MUSCULOSKELETAL:  No edema; No deformity  ?SKIN: Warm and dry ?NEUROLOGIC:  Alert and oriented x 3 ?PSYCHIATRIC:  Normal affect  ? ?ASSESSMENT:   ? ?1. Coronary artery disease involving native cor

## 2021-05-11 NOTE — Patient Instructions (Signed)
Medication Instructions:  ?Your physician recommends that you continue on your current medications as directed. Please refer to the Current Medication list given to you today. ? ?*If you need a refill on your cardiac medications before your next appointment, please call your pharmacy* ? ? ?Lab Work: ?NONE ?If you have labs (blood work) drawn today and your tests are completely normal, you will receive your results only by: ?MyChart Message (if you have MyChart) OR ?A paper copy in the mail ?If you have any lab test that is abnormal or we need to change your treatment, we will call you to review the results. ? ? ?Testing/Procedures: ?NONE ? ? ?Follow-Up: ?At [facility], you and your health needs are our priority.  As part of our continuing mission to provide you with exceptional heart care, we have created designated Provider Care Teams.  These Care Teams include your primary Cardiologist (physician) and Advanced Practice Providers (APPs -  Physician Assistants and Nurse Practitioners) who all work together to provide you with the care you need, when you need it. ? ?Your next appointment:   ?1 year(s) ? ?The format for your next appointment:   ?In Person ? ?Provider:   ?Sherren Mocha, MD   ? ?  ?

## 2021-05-14 ENCOUNTER — Encounter: Payer: Self-pay | Admitting: Hematology

## 2021-06-14 DIAGNOSIS — I2510 Atherosclerotic heart disease of native coronary artery without angina pectoris: Secondary | ICD-10-CM | POA: Diagnosis not present

## 2021-06-14 DIAGNOSIS — R739 Hyperglycemia, unspecified: Secondary | ICD-10-CM | POA: Diagnosis not present

## 2021-06-14 DIAGNOSIS — Z0000 Encounter for general adult medical examination without abnormal findings: Secondary | ICD-10-CM | POA: Diagnosis not present

## 2021-06-14 DIAGNOSIS — Z125 Encounter for screening for malignant neoplasm of prostate: Secondary | ICD-10-CM | POA: Diagnosis not present

## 2021-06-14 DIAGNOSIS — N529 Male erectile dysfunction, unspecified: Secondary | ICD-10-CM | POA: Diagnosis not present

## 2021-06-14 DIAGNOSIS — C8300 Small cell B-cell lymphoma, unspecified site: Secondary | ICD-10-CM | POA: Diagnosis not present

## 2021-06-14 DIAGNOSIS — N400 Enlarged prostate without lower urinary tract symptoms: Secondary | ICD-10-CM | POA: Diagnosis not present

## 2021-06-27 ENCOUNTER — Ambulatory Visit: Payer: BC Managed Care – PPO | Admitting: Cardiovascular Disease

## 2021-07-04 ENCOUNTER — Encounter: Payer: Self-pay | Admitting: Cardiovascular Disease

## 2021-07-04 NOTE — Telephone Encounter (Signed)
Lab report printed and sent to Medical Records to be scanned to chart.

## 2021-07-08 ENCOUNTER — Other Ambulatory Visit: Payer: Self-pay | Admitting: Cardiovascular Disease

## 2021-07-08 DIAGNOSIS — I2510 Atherosclerotic heart disease of native coronary artery without angina pectoris: Secondary | ICD-10-CM

## 2021-07-20 DIAGNOSIS — H25813 Combined forms of age-related cataract, bilateral: Secondary | ICD-10-CM | POA: Diagnosis not present

## 2021-07-20 DIAGNOSIS — H35373 Puckering of macula, bilateral: Secondary | ICD-10-CM | POA: Diagnosis not present

## 2021-07-20 DIAGNOSIS — H442E3 Degenerative myopia with other maculopathy, bilateral eye: Secondary | ICD-10-CM | POA: Diagnosis not present

## 2021-07-20 DIAGNOSIS — H43813 Vitreous degeneration, bilateral: Secondary | ICD-10-CM | POA: Diagnosis not present

## 2021-08-27 ENCOUNTER — Other Ambulatory Visit: Payer: Self-pay

## 2021-10-05 ENCOUNTER — Other Ambulatory Visit: Payer: Self-pay

## 2021-10-15 DIAGNOSIS — M546 Pain in thoracic spine: Secondary | ICD-10-CM | POA: Diagnosis not present

## 2021-10-21 DIAGNOSIS — M545 Low back pain, unspecified: Secondary | ICD-10-CM | POA: Diagnosis not present

## 2021-10-29 DIAGNOSIS — M546 Pain in thoracic spine: Secondary | ICD-10-CM | POA: Diagnosis not present

## 2021-10-29 DIAGNOSIS — M545 Low back pain, unspecified: Secondary | ICD-10-CM | POA: Diagnosis not present

## 2021-11-08 ENCOUNTER — Telehealth: Payer: Self-pay

## 2021-11-08 NOTE — Patient Outreach (Signed)
  Care Coordination   Initial Visit Note   11/08/2021 Name: ALEPH NICKSON MRN: [id number] DOB: [birth year]  SYLVESTRE RATHGEBER is a 71 y.o. year old male who sees [name], MD for primary care. I spoke with  [name] by phone today.  What matters to the patients health and wellness today?  No Concerns Expressed. Patient returned call to acknowledge receipt of voice message. Patient prefers not to answer questions via phone.   Goals Addressed   None     SDOH assessments and interventions completed:  No     Care Coordination Interventions Activated:  No  Care Coordination Interventions:  No, not indicated   Follow up plan: No further intervention required.   Encounter Outcome:  [facility] [place] [facility] [phone]

## 2021-11-08 NOTE — Patient Outreach (Signed)
  Care Coordination   11/08/2021 Name: Jake Kennedy MRN: [id number] DOB: 1950/11/15   Care Coordination Outreach Attempts:  An unsuccessful telephone outreach was attempted today to offer the patient information about available care coordination services as a benefit of their health plan.   Follow Up Plan:  Additional outreach attempts will be made to offer the patient care coordination information and services.   Encounter Outcome:  No Answer  Care Coordination Interventions Activated:  No   Care Coordination Interventions:  No, not indicated    [place] Management [phone]

## 2021-11-13 DIAGNOSIS — H25813 Combined forms of age-related cataract, bilateral: Secondary | ICD-10-CM | POA: Diagnosis not present

## 2021-11-13 DIAGNOSIS — H35373 Puckering of macula, bilateral: Secondary | ICD-10-CM | POA: Diagnosis not present

## 2021-12-06 DIAGNOSIS — C44519 Basal cell carcinoma of skin of other part of trunk: Secondary | ICD-10-CM | POA: Diagnosis not present

## 2021-12-06 DIAGNOSIS — D0462 Carcinoma in situ of skin of left upper limb, including shoulder: Secondary | ICD-10-CM | POA: Diagnosis not present

## 2021-12-06 DIAGNOSIS — C44719 Basal cell carcinoma of skin of left lower limb, including hip: Secondary | ICD-10-CM | POA: Diagnosis not present

## 2021-12-06 DIAGNOSIS — Z85828 Personal history of other malignant neoplasm of skin: Secondary | ICD-10-CM | POA: Diagnosis not present

## 2021-12-06 DIAGNOSIS — D225 Melanocytic nevi of trunk: Secondary | ICD-10-CM | POA: Diagnosis not present

## 2021-12-06 DIAGNOSIS — L814 Other melanin hyperpigmentation: Secondary | ICD-10-CM | POA: Diagnosis not present

## 2021-12-06 DIAGNOSIS — L820 Inflamed seborrheic keratosis: Secondary | ICD-10-CM | POA: Diagnosis not present

## 2021-12-06 DIAGNOSIS — L570 Actinic keratosis: Secondary | ICD-10-CM | POA: Diagnosis not present

## 2021-12-06 DIAGNOSIS — L821 Other seborrheic keratosis: Secondary | ICD-10-CM | POA: Diagnosis not present

## 2022-01-01 ENCOUNTER — Other Ambulatory Visit: Payer: Self-pay

## 2022-01-01 ENCOUNTER — Inpatient Hospital Stay: Payer: BC Managed Care – PPO

## 2022-01-01 ENCOUNTER — Inpatient Hospital Stay: Payer: BC Managed Care – PPO | Attending: Hematology | Admitting: Hematology

## 2022-01-01 VITALS — BP 130/79 | HR 58 | Temp 97.8°F | Resp 18 | Ht 74.0 in | Wt 184.0 lb

## 2022-01-01 DIAGNOSIS — C9110 Chronic lymphocytic leukemia of B-cell type not having achieved remission: Secondary | ICD-10-CM | POA: Insufficient documentation

## 2022-01-01 DIAGNOSIS — E041 Nontoxic single thyroid nodule: Secondary | ICD-10-CM | POA: Insufficient documentation

## 2022-01-01 DIAGNOSIS — Z85828 Personal history of other malignant neoplasm of skin: Secondary | ICD-10-CM | POA: Insufficient documentation

## 2022-01-01 LAB — LACTATE DEHYDROGENASE: LDH: 140 U/L (ref 98–192)

## 2022-01-01 LAB — CBC WITH DIFFERENTIAL (CANCER CENTER ONLY)
Abs Immature Granulocytes: 0.02 10*3/uL (ref 0.00–0.07)
Basophils Absolute: 0 10*3/uL (ref 0.0–0.1)
Basophils Relative: 1 %
Eosinophils Absolute: 0.2 10*3/uL (ref 0.0–0.5)
Eosinophils Relative: 3 %
HCT: 50.7 % (ref 39.0–52.0)
Hemoglobin: 16.7 g/dL (ref 13.0–17.0)
Immature Granulocytes: 0 %
Lymphocytes Relative: 17 %
Lymphs Abs: 0.8 10*3/uL (ref 0.7–4.0)
MCH: 31 pg (ref 26.0–34.0)
MCHC: 32.9 g/dL (ref 30.0–36.0)
MCV: 94.1 fL (ref 80.0–100.0)
Monocytes Absolute: 0.5 10*3/uL (ref 0.1–1.0)
Monocytes Relative: 10 %
Neutro Abs: 3.4 10*3/uL (ref 1.7–7.7)
Neutrophils Relative %: 69 %
Platelet Count: 137 10*3/uL — ABNORMAL LOW (ref 150–400)
RBC: 5.39 MIL/uL (ref 4.22–5.81)
RDW: 12.8 % (ref 11.5–15.5)
WBC Count: 5 10*3/uL (ref 4.0–10.5)
nRBC: 0 % (ref 0.0–0.2)

## 2022-01-01 LAB — CMP (CANCER CENTER ONLY)
ALT: 35 U/L (ref 0–44)
AST: 21 U/L (ref 15–41)
Albumin: 4.4 g/dL (ref 3.5–5.0)
Alkaline Phosphatase: 64 U/L (ref 38–126)
Anion gap: 4 — ABNORMAL LOW (ref 5–15)
BUN: 22 mg/dL (ref 8–23)
CO2: 31 mmol/L (ref 22–32)
Calcium: 9.3 mg/dL (ref 8.9–10.3)
Chloride: 106 mmol/L (ref 98–111)
Creatinine: 0.97 mg/dL (ref 0.61–1.24)
GFR, Estimated: >60 mL/min (ref >60)
Glucose, Bld: 99 mg/dL (ref 70–99)
Potassium: 4.5 mmol/L (ref 3.5–5.1)
Sodium: 141 mmol/L (ref 135–145)
Total Bilirubin: 0.9 mg/dL (ref 0.3–1.2)
Total Protein: 6.3 g/dL — ABNORMAL LOW (ref 6.5–8.1)

## 2022-01-01 NOTE — Progress Notes (Signed)
HEMATOLOGY/ONCOLOGY CLINIC NOTE  Date of Service:  01/01/2022  Patient Care Team: [name], MD as PCP - General (Internal Medicine) Sherren Mocha, MD as PCP - Cardiology (Cardiology)  CHIEF COMPLAINTS/PURPOSE OF CONSULTATION:  Chronic Lymphocytic Leukemia  Oncologic History:   Oncology History  Chronic lymphocytic leukemia (CLL), B-cell ([place])  02/14/2015 Initial Diagnosis   Monoclonal B-cell population CD5 and CD20 positive, differential diagnosis CLL versus mantle cell lymphoma ( patient had absolute lymphocyte count of 8000 in 2010), remained stable.   07/09/2018 -  Chemotherapy   The patient had obinutuzumab (GAZYVA) 100 mg in sodium chloride 0.9 % 100 mL (0.9615 mg/mL) chemo infusion, 100 mg, Intravenous, Once, 2 of 6 cycles Administration: 100 mg (07/09/2018), 900 mg (07/10/2018), 1,000 mg (07/31/2018)  for chemotherapy treatment.       HISTORY OF PRESENTING ILLNESS:  Please see previous note for details on initial presentation  Interval History:   Jake Kennedy returns for continued evaluation and management of his CLL.   Patient was last seen by me on 05/08/2021 and was doing well overall.   Patient reports he has been doing well overall without any new medical concerns since our last visit. He denies any new lung problems. He denies fever, chills, night sweats, back pain, abdominal pain, change in breathing, new lumps/bumps, or leg swelling.   He notes he had two basal and one squamous cell carcinomas removed since our last visit. Patient follows-up with his dermatologist for basal and squamous cell carcinoma.   He regularly follows-up with his PCP, Dermatologist, and Cardiologist.   He is compliant with all his medications.   He has not received the influenza vaccine, RSV vaccine, and the COVID-19 Booster. He has received his Shingle vaccine and Pneumonia vaccine.    Patient reports his PCP has changed to Dr. Syliva Overman.   MEDICAL HISTORY:  Past  Medical History:  Diagnosis Date   Basal cell carcinoma    moles removed in 2007 and 2012 from L nose and L hip   BPH (benign prostatic hypertrophy)    CAD (coronary artery disease), native coronary artery    a. 02/10/2015 95% mid LAD stenosis s/p DES  b. 07/15/16: LHC showed stable non obst dz and patent stent   CLL (chronic lymphocytic leukemia) ([place])    Coronary artery disease    ED (erectile dysfunction)    Lymphocytosis 02/11/2015   seen by Dr. Lindi Adie 02/11/2015, working up for possible CLL    SURGICAL HISTORY: Past Surgical History:  Procedure Laterality Date   CARDIAC CATHETERIZATION  02/10/2015   CARDIAC CATHETERIZATION N/A 02/10/2015   Procedure: Left Heart Cath and Coronary Angiography;  Surgeon: Sherren Mocha, MD;  Location: [facility];  Service: Cardiovascular;  Laterality: N/A;   CARDIAC CATHETERIZATION N/A 02/10/2015   Procedure: Coronary Stent Intervention;  Surgeon: Sherren Mocha, MD;  Location: [facility];  Service: Cardiovascular;  Laterality: N/A;   CORONARY STENT PLACEMENT  02/10/2015   DES to LAD   LAMINECTOMY     LEFT HEART CATH AND CORONARY ANGIOGRAPHY N/A 07/15/2016   Procedure: Left Heart Cath and Coronary Angiography;  Surgeon: Martinique, Peter M, MD;  Location: [facility];  Service: Cardiovascular;  Laterality: N/A;   TONSILLECTOMY     VIDEO BRONCHOSCOPY WITH ENDOBRONCHIAL NAVIGATION N/A 09/16/2018   Procedure: VIDEO BRONCHOSCOPY WITH ENDOBRONCHIAL NAVIGATION AND BIOPIES;  Surgeon: Collene Gobble, MD;  Location: [facility];  Service: Thoracic;  Laterality: N/A;    SOCIAL  HISTORY: Social History   Socioeconomic History   Marital status: Married    Spouse name: Not on file   Number of children: Not on file   Years of education: Not on file   Highest education level: Not on file  Occupational History   Not on file  Tobacco Use   Smoking status: Never   Smokeless tobacco: Never  Vaping Use   Vaping Use: Never used  Substance and Sexual  Activity   Alcohol use: Yes    Alcohol/week: 0.0 standard drinks of alcohol    Comment: occasional   Drug use: No   Sexual activity: Not on file  Other Topics Concern   Not on file  Social History Narrative   Not on file   Social Determinants of Health   Financial Resource Strain: Not on file  Food Insecurity: Not on file  Transportation Needs: Not on file  Physical Activity: Not on file  Stress: Not on file  Social Connections: Not on file  Intimate Partner Violence: Not on file    FAMILY HISTORY: Family History  Problem Relation Age of Onset   Diabetes Mother    Heart failure Mother    Heart attack Mother        occured in 68s.    Healthy Sister    Healthy Brother    Healthy Maternal Grandmother    Healthy Maternal Grandfather    Healthy Paternal Grandmother    Healthy Paternal Grandfather    Healthy Brother     ALLERGIES:  is allergic to adhesive [tape].  MEDICATIONS:  Current Outpatient Medications  Medication Sig Dispense Refill   aspirin 81 MG tablet Take 81 mg by mouth daily.     atorvastatin (LIPITOR) 80 MG tablet TAKE 1 TABLET(80 MG) BY MOUTH DAILY AT 6 PM 90 tablet 3   Multiple Vitamin (MULTIVITAMIN) tablet Take 1 tablet by mouth daily.     Multiple Vitamins-Minerals (PRESERVISION AREDS PO) Take 2 capsules by mouth daily.     nitroGLYCERIN (NITROSTAT) 0.4 MG SL tablet PLACE 1 TABLET UNDER THE TONGUE EVERY 5 MINUTES AS NEEDED FOR CHEST PAIN 25 tablet 3   Omega-3 Fatty Acids (FISH OIL CONCENTRATE PO) Take 1 capsule by mouth daily.      sildenafil (VIAGRA) 100 MG tablet sildenafil 100 mg tablet     No current facility-administered medications for this visit.    REVIEW OF SYSTEMS:   .10 Point review of Systems was done is negative except as noted above.  PHYSICAL EXAMINATION: ECOG FS:1 - Symptomatic but completely ambulatory  There were no vitals filed for this visit.  Wt Readings from Last 3 Encounters:  05/11/21 184 lb 12.8 oz (83.8 kg)   05/08/21 184 lb 6.4 oz (83.6 kg)  04/26/21 184 lb 6.4 oz (83.6 kg)   There is no height or weight on file to calculate BMI.   NAD GENERAL:alert, in no acute distress and comfortable SKIN: no acute rashes, no significant lesions EYES: conjunctiva are pink and non-injected, sclera anicteric OROPHARYNX: MMM, no exudates, no oropharyngeal erythema or ulceration NECK: supple, no JVD LYMPH:  no palpable lymphadenopathy in the cervical, axillary or inguinal regions LUNGS: clear to auscultation b/l with normal respiratory effort HEART: regular rate & rhythm ABDOMEN:  normoactive bowel sounds , non tender, not distended. No palpable hepatosplenomegaly Extremity: no pedal edema PSYCH: alert & oriented x 3 with fluent speech NEURO: no focal motor/sensory deficits  LABORATORY DATA:  I have reviewed the data as listed  .  Latest Ref Rng & Units 01/01/2022    8:39 AM 05/08/2021    1:30 PM 11/07/2020    1:26 PM  CBC  WBC 4.0 - 10.5 K/uL 5.0  5.0  4.9   Hemoglobin 13.0 - 17.0 g/dL 16.7  16.1  15.2   Hematocrit 39.0 - 52.0 % 50.7  47.7  46.1   Platelets 150 - 400 K/uL 137  156  158    .    Latest Ref Rng & Units 01/01/2022    8:39 AM 05/08/2021    1:30 PM 11/07/2020    1:26 PM  CMP  Glucose 70 - 99 mg/dL 99  109  93   BUN 8 - 23 mg/dL '22  25  20   '$ Creatinine 0.61 - 1.24 mg/dL 0.97  0.86  0.88   Sodium 135 - 145 mmol/L 141  140  139   Potassium 3.5 - 5.1 mmol/L 4.5  4.4  4.4   Chloride 98 - 111 mmol/L 106  105  105   CO2 22 - 32 mmol/L '31  29  26   '$ Calcium 8.9 - 10.3 mg/dL 9.3  8.9  8.8   Total Protein 6.5 - 8.1 g/dL 6.3  6.2  6.0   Total Bilirubin 0.3 - 1.2 mg/dL 0.9  0.8  0.8   Alkaline Phos 38 - 126 U/L 64  56  67   AST 15 - 41 U/L '21  23  22   '$ ALT 0 - 44 U/L 35  34  33   01/04/2019 [place] [facility] he says that leg pulls 5 minutes on top of like a lot of other things and so if it is normal on the floor things to create those findings within patient's and he just keep adding layers  that they are related to this new system without really correcting it to be with her off of optimization was if there was anything that broke the flow of of design optimization it was needing less of that if it brings on the whole optimization process and so likely just like continuously [place] the schedule can figure out what the letter is white lab reporting were not patient is to see if can do the schedulers will do Monoferric level that was already covered and lost, her recovery is working portal any concerns with bloating on the look ROID (Accession [id number])    04/30/18 Outside Labs:              Cytology done 05/25/2019 revealed "FINAL MICROSCOPIC DIAGNOSIS:  - No malignant cells identified"  RADIOGRAPHIC STUDIES: I have personally reviewed the radiological images as listed and agreed with the findings in the report. No results found.   ASSESSMENT & PLAN:  71 y.o. male with  1. Chronic Lymphocytic Leukemia Initial diagnosis on 02/13/15 Peripheral blood flow cytometry which revealed a Monoclonal B-cell population 09/25/15 Cytogenetics ruled out Mantle cell lymphoma. 12/25/17 FISH study ruled out translocation (11;14) 12/30/17 BM Biopsy revealed CLL without evidence of transformation  03/17/18 FISH CLL Prognostic panel revealed an 11q deletion and Trisomy 12  03/19/18 CT N/C/A/P revealed NECK: Multiple enlarged bilateral cervical lymph nodes consistent with the given diagnosis of CLL. The largest node is a right level 3 lymph node measuring 3.4 x 2.8 x 1.2 cm. 2. Degenerative changes of the cervical spine. 3. Hypodense lesion in the right side of the tongue measuring up to 1.5 cm. This could represent focal infection. Neoplasm is considered less likely. The lesion appears to  be superficial along the right lateral aspect of the anterior tongue a. This should be amenable to direct inspection, C/A/P: Marked adenopathy within the chest, abdomen, and pelvis, consistent with active  lymphoma/leukemia. 2. Splenomegaly is nonspecific. Splenic involvement cannot be excluded. 3. Right greater than left and basilar predominant peribronchovascular interstitial thickening and nodularity. Considerations include pulmonary involvement of lymphoma and/or Infection/aspiration. 4. Coronary artery atherosclerosis. Aortic Atherosclerosis. 5. Trace nonspecific pelvic fluid.  03/26/18 Left cervical LN biopsy revealed SLL/CLL  08/11/18 CT Neck revealed "Decreased cervical lymphadenopathy with all remaining nodes being subcentimeter in short axis. 2. Sinusitis."  08/11/18 CT Chest revealed "Interval improvement without resolution of the basilar predominant peripheral peribronchovascular nodularity. 2. Interval development of a new masslike consolidative opacity in the medial right lung measuring up to 3.3 cm. This may be infectious/inflammatory. Lymphoma involvement not excluded. 3. Interval resolution of splenomegaly with small volume perisplenic ascites identified today. 4.  Aortic Atherosclerosis."  04/16/19 CT Chest ([id number]) revealed "1. Overall today's study demonstrates improved appearance of the lungs with resolution of widespread thickening of the peribronchovascular interstitium as well as scattered areas of peribronchovascular micronodularity seen throughout the lungs bilaterally. Mild cylindrical bronchiectasis is again noted. 2. Stable 9 x 8 mm pulmonary nodule in the left lower lobe (axial image 126 of series 3), strongly favored to be benign."  #2 Thyroid nodule- appears benign on Ultrasound. 10/09/2018 US THYROID (Accession [id number]) revealed "1. Solitary 1.2 cm benign colloid containing cyst within the left lobe of the thyroid does not meet imaging criteria to recommend percutaneous sampling or continued dedicated follow-up. 2. Otherwise, normal thyroid ultrasound."   PLAN: -Labs done today show CBC which is within normal limits, CMP unremarkable, LDH in the normal range. Platelet  counts are 137 K. -Patient has no lab or clinical evidence of CLL recurrence/progression at this time. -No indication for additional CLL treatment at this time. -Return to clinic with Dr. Irene Limbo with labs in 7 months  FOLLOW UP: F/u with PCP with labs in 6 months RTC with Dr Irene Limbo with labs in 12 months  The total time spent in the appointment was 21 minutes* .  All of the patient's questions were answered with apparent satisfaction. The patient knows to call the clinic with any problems, questions or concerns.   Sullivan Lone MD MS AAHIVMS [facility] Hematology/Oncology Physician [facility]  .*Total Encounter Time as defined by the Centers for Medicare and Medicaid Services includes, in addition to the face-to-face time of a patient visit (documented in the note above) non-face-to-face time: obtaining and reviewing outside history, ordering and reviewing medications, tests or procedures, care coordination (communications with other health care professionals or caregivers) and documentation in the medical record.   I, Cleda Mccreedy, am acting as a Education administrator for Sullivan Lone, MD.  .I have reviewed the above documentation for accuracy and completeness, and I agree with the above. Brunetta Genera MD

## 2022-01-03 ENCOUNTER — Other Ambulatory Visit: Payer: Self-pay

## 2022-01-04 ENCOUNTER — Ambulatory Visit
Admission: RE | Admit: 2022-01-04 | Discharge: 2022-01-04 | Disposition: A | Discharge status: Home / Self Care | Payer: BC Managed Care – PPO | Source: Ambulatory Visit | Attending: Internal Medicine | Admitting: Internal Medicine

## 2022-01-04 ENCOUNTER — Other Ambulatory Visit: Payer: Self-pay | Admitting: Internal Medicine

## 2022-01-04 DIAGNOSIS — S9031XA Contusion of right foot, initial encounter: Secondary | ICD-10-CM

## 2022-01-07 ENCOUNTER — Encounter: Payer: Self-pay | Admitting: Hematology

## 2022-01-07 ENCOUNTER — Other Ambulatory Visit: Payer: Self-pay

## 2022-03-13 DIAGNOSIS — L608 Other nail disorders: Secondary | ICD-10-CM | POA: Diagnosis not present

## 2022-03-13 DIAGNOSIS — Z6823 Body mass index (BMI) 23.0-23.9, adult: Secondary | ICD-10-CM | POA: Diagnosis not present

## 2022-03-13 DIAGNOSIS — L03031 Cellulitis of right toe: Secondary | ICD-10-CM | POA: Diagnosis not present

## 2022-03-19 DIAGNOSIS — H43813 Vitreous degeneration, bilateral: Secondary | ICD-10-CM | POA: Diagnosis not present

## 2022-03-19 DIAGNOSIS — H5213 Myopia, bilateral: Secondary | ICD-10-CM | POA: Diagnosis not present

## 2022-03-19 DIAGNOSIS — H25813 Combined forms of age-related cataract, bilateral: Secondary | ICD-10-CM | POA: Diagnosis not present

## 2022-03-19 DIAGNOSIS — H35373 Puckering of macula, bilateral: Secondary | ICD-10-CM | POA: Diagnosis not present

## 2022-03-19 DIAGNOSIS — H442E3 Degenerative myopia with other maculopathy, bilateral eye: Secondary | ICD-10-CM | POA: Diagnosis not present

## 2022-03-19 DIAGNOSIS — H52213 Irregular astigmatism, bilateral: Secondary | ICD-10-CM | POA: Diagnosis not present

## 2022-03-19 DIAGNOSIS — H524 Presbyopia: Secondary | ICD-10-CM | POA: Diagnosis not present

## 2022-03-26 DIAGNOSIS — L608 Other nail disorders: Secondary | ICD-10-CM | POA: Diagnosis not present

## 2022-05-23 ENCOUNTER — Ambulatory Visit: Payer: BC Managed Care – PPO | Attending: Cardiovascular Disease | Admitting: Cardiovascular Disease

## 2022-05-23 ENCOUNTER — Encounter: Payer: Self-pay | Admitting: Cardiovascular Disease

## 2022-05-23 VITALS — BP 100/68 | HR 61 | Ht 74.0 in | Wt 179.8 lb

## 2022-05-23 DIAGNOSIS — I2510 Atherosclerotic heart disease of native coronary artery without angina pectoris: Secondary | ICD-10-CM | POA: Diagnosis not present

## 2022-05-23 DIAGNOSIS — E782 Mixed hyperlipidemia: Secondary | ICD-10-CM | POA: Diagnosis not present

## 2022-05-23 NOTE — Progress Notes (Signed)
Cardiology Office Note:    Date:  05/23/2022   ID:  Windy Fast, DOB 07/24/1950, MRN [id number]  PCP:  [name], MD (Inactive)   [facility] Providers Cardiologist:  [name], MD     Referring MD: [name], MD   Chief Complaint  Patient presents with   Coronary Artery Disease    History of Present Illness:    Jake Kennedy is a 72 y.o. male with a hx of coronary artery disease, presenting for follow-up evaluation.  The patient presented in 2016 with exertional angina.  He had a nuclear stress test that demonstrated severe ischemia.  Cardiac catheterization demonstrated moderate RCA stenosis and critical proximal LAD stenosis.  He was treated with PCI of the LAD with a drug-eluting stent.  He had recurrent angina the following year and cardiac catheterization demonstrated patency at his stent site and no change in the moderate nonobstructive RCA stenosis.  Ongoing medical therapy was recommended.   He's here alone today. Doing very well. 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 Today, he denies symptoms of palpitations, chest pain, shortness of breath, orthopnea, PND, lower extremity edema, dizziness, or syncope.   Past Medical History:  Diagnosis Date   Basal cell carcinoma    moles removed in 2007 and 2012 from L nose and L hip   BPH (benign prostatic hypertrophy)    CAD (coronary artery disease), native coronary artery    a. 02/10/2015 95% mid LAD stenosis s/p DES  b. 07/15/16: LHC showed stable non obst dz and patent stent   CLL (chronic lymphocytic leukemia)    Coronary artery disease    ED (erectile dysfunction)    Lymphocytosis 02/11/2015   seen by Dr. Pamelia Hoit 02/11/2015, working up for possible CLL    Past Surgical History:  Procedure Laterality Date   CARDIAC CATHETERIZATION  02/10/2015   CARDIAC CATHETERIZATION N/A 02/10/2015   Procedure: Left Heart Cath and Coronary Angiography;  Surgeon: [name], MD;  Location: [facility] INVASIVE CV LAB;  Service: Cardiovascular;  Laterality: N/A;   CARDIAC CATHETERIZATION N/A 02/10/2015   Procedure: Coronary Stent Intervention;  Surgeon: [name], MD;  Location: [facility] INVASIVE CV LAB;  Service: Cardiovascular;  Laterality: N/A;   CORONARY STENT PLACEMENT  02/10/2015   DES to LAD   LAMINECTOMY     LEFT HEART CATH AND CORONARY ANGIOGRAPHY N/A 07/15/2016   Procedure: Left Heart Cath and Coronary Angiography;  Surgeon: [place], Peter M, MD;  Location: [facility] INVASIVE CV LAB;  Service: Cardiovascular;  Laterality: N/A;   TONSILLECTOMY     VIDEO BRONCHOSCOPY WITH ENDOBRONCHIAL NAVIGATION N/A 09/16/2018   Procedure: VIDEO BRONCHOSCOPY WITH ENDOBRONCHIAL NAVIGATION AND BIOPIES;  Surgeon: Leslye Peer, MD;  Location: [facility] OR;  Service: Thoracic;  Laterality: N/A;    Current Medications: Current Meds  Medication Sig   aspirin 81 MG tablet Take 81 mg by mouth daily.   atorvastatin (LIPITOR) 80 MG tablet TAKE 1 TABLET(80 MG) BY MOUTH DAILY AT 6 PM   Multiple Vitamin (MULTIVITAMIN) tablet Take 1 tablet by mouth daily.   Multiple Vitamins-Minerals (PRESERVISION AREDS PO) Take 2 capsules by mouth daily.   nitroGLYCERIN (NITROSTAT) 0.4 MG SL tablet PLACE 1 TABLET UNDER THE TONGUE EVERY 5 MINUTES AS NEEDED FOR CHEST PAIN   Omega-3 Fatty Acids (FISH OIL CONCENTRATE PO) Take 1 capsule by mouth daily.    sildenafil (VIAGRA) 100 MG tablet sildenafil 100 mg tablet     Allergies:  Adhesive [tape]   Social History   Socioeconomic History   Marital status: Married    Spouse name: Not on file   Number of children: Not on file   Years of education: Not on file   Highest education level: Not on file  Occupational History   Not on file  Tobacco Use   Smoking status: Never   Smokeless tobacco: Never  Vaping Use   Vaping Use: Never used  Substance and Sexual Activity   Alcohol use: Yes    Alcohol/week: 0.0 standard drinks of alcohol    Comment: occasional   Drug  use: No   Sexual activity: Not on file  Other Topics Concern   Not on file  Social History Narrative   Not on file   Social Determinants of Health   Financial Resource Strain: Not on file  Food Insecurity: Not on file  Transportation Needs: Not on file  Physical Activity: Not on file  Stress: Not on file  Social Connections: Not on file     Family History: The patient's family history includes Diabetes in his mother; Healthy in his brother, brother, maternal grandfather, maternal grandmother, paternal grandfather, paternal grandmother, and sister; Heart attack in his mother; Heart failure in his mother.  ROS:   Please see the history of present illness.    All other systems reviewed and are negative.  EKGs/Labs/Other Studies Reviewed:    The following studies were reviewed today: Cardiac Studies & Procedures   CARDIAC CATHETERIZATION  CARDIAC CATHETERIZATION 07/15/2016  Narrative  Mid RCA lesion, 50 %stenosed.  Mid LAD-2 lesion, 0 %stenosed.  A drug eluting is in place  Mid LAD-1 lesion, 30 %stenosed.  1st Diag lesion, 30 %stenosed.  The left ventricular systolic function is normal.  LV end diastolic pressure is normal.  The left ventricular ejection fraction is 55-65% by visual estimate.  1. Nonobstructive CAD. The stent in the mid LAD is widely patent. There is 30% narrowing proximal to the stent. The RCA is a large and ectatic vessel with 50% stenosis in the mid vessel. This is unchanged from prior studies. 2. Normal LV function 3. Normal LVEDP.  Plan: no culprit lesion to explain his current chest pain. Recommend continued medical therapy.  Findings Coronary Findings Diagnostic  Dominance: Right  Left Main Vessel was injected. Vessel is normal in caliber. Vessel is angiographically normal.  Left Anterior Descending  Mid LAD-2 lesion with no stenosis was previously treated. The lesion is eccentric.  First Diagonal Branch  Ramus  Intermedius Vessel is angiographically normal.  Left Circumflex Vessel is angiographically normal.  Right Coronary Artery The vessel is moderately ectatic. Anterior take off The lesion is segmental.  Intervention  No interventions have been documented.   CARDIAC CATHETERIZATION  CARDIAC CATHETERIZATION 02/10/2015  Narrative  Mid LAD-2 lesion, 95% stenosed. Post intervention, there is a 0% residual stenosis.  Mid LAD-1 lesion, 30% stenosed.  1st Diag lesion, 30% stenosed.  1. Severe LAD stenosis, treated successfully with PCI using a drug-eluting stent platform 2. Moderate diffuse RCA stenosis, recommend medical therapy 3. Patent left circumflex without significant stenosis  Recommend dual antiplatelet therapy with aspirin and Plavix for 12 months without interruption. The patient needs aggressive risk reduction. A high-dose statin drug will be initiated.  Findings Coronary Findings Diagnostic  Dominance: Right  Left Anterior Descending  Eccentric.  First Diagonal Branch  Ramus Intermedius . Vessel is angiographically normal.  Left Circumflex . Vessel is angiographically normal.  Right Coronary Artery Diffuse.  Intervention  Mid LAD-2 lesion PCI The pre-interventional distal flow is normal (TIMI 3). Pre-stent angioplasty was performed. A drug-eluting stent was placed. Post-stent angioplasty was performed. Maximum pressure: 16 atm. The post-interventional distal flow is normal (TIMI 3). The intervention was successful. No complications occurred at this lesion. There is 95% eccentric mid LAD stenosis. This is a large vessel that supplies the apex and wraps around to supply the inferoapex. Heparin is used for anticoagulation. A therapeutic ACT was achieved. The patient is preloaded with clopidogrel 600 mg on the table. An XB LAD 3.5 cm guide catheter was used. A cougar guidewire was advanced beyond the lesion. The lesion is predilated with a 2.5 mm balloon, stented  with a 3.0 x 16 mm Promus DES, and the stent was postdilated with a 3.25 mm noncompliant balloon to 16 atm. There is a 0% residual stenosis post intervention.   STRESS TESTS  MYOCARDIAL PERFUSION IMAGING 02/02/2015  Narrative  The left ventricular ejection fraction is mildly decreased (45-54%).  Nuclear stress EF: 52%. There is hypokinesis in the basal inferior, inferolateral and mid inferolateral walls.  Upsloping ST segment depression ST segment depression was noted during stress in the II, III, V5 and V6 leads, and returning to baseline after less than 1 minute of recovery.  Defect 1: There is a large defect of severe severity present in the basal inferoseptal, basal inferior, basal inferolateral, mid inferoseptal, mid inferior, mid inferolateral, apical anterior, apical septal, apical inferior and apex location.  Findings consistent with prior myocardial infarction with peri-infarct ischemia.  This is a high risk study.  This is a high risk study, there is a medium size infarct in the basal and mid inferior, inferolateral walls with a large area, severe severity ischemia extending into basal and mid inferoseptal, and apical inferior, septal, anterior and in the true apex (dominant RCA territory or multivessel disease). SDS 14.  The patient is scheduled for a left cardiac cath on Januay 3, 2017.   ECHOCARDIOGRAM  ECHOCARDIOGRAM COMPLETE 10/09/2018  Narrative ECHOCARDIOGRAM REPORT    Patient Name:   Jake Kennedy Date of Exam: 10/09/2018 Medical Rec #:  [id number]     Height:       74.0 in Accession #:    [id number]    Weight:       154.0 lb Date of Birth:  1950/05/25     BSA:          1.94 m Patient Age:    68 years      BP:           106/68 mmHg Patient Gender: M             HR:           77 bpm. Exam Location:  [address]   Procedure: 2D Echo, Color Doppler and Cardiac Doppler  Indications:    R06.02  History:        Patient has prior history of Echocardiogram  examinations, most recent 02/28/2015. CAD Signs/Symptoms: Shortness of Breath Risk Factors: Dyslipidemia.  Sonographer:    Samule Ohm RDCS Referring Phys: [phone] Maite Burlison  IMPRESSIONS   1. The left ventricle has normal systolic function, with an ejection fraction of 55-60%. The cavity size was normal. Left ventricular diastolic Doppler parameters are consistent with impaired relaxation. No evidence of left ventricular regional wall motion abnormalities. 2. The right ventricle has normal systolic function. The cavity was normal. There is no increase in right ventricular wall thickness. Right ventricular  systolic pressure could not be assessed. 3. The mitral valve is myxomatous. Mild thickening of the mitral valve leaflet. 4. The aortic valve is tricuspid. Mild thickening of the aortic valve. Mild calcification of the aortic valve. 5. The aorta is normal unless otherwise noted.  FINDINGS Left Ventricle: The left ventricle has normal systolic function, with an ejection fraction of 55-60%. The cavity size was normal. There is no increase in left ventricular wall thickness. Left ventricular diastolic Doppler parameters are consistent with impaired relaxation. Normal left ventricular filling pressures No evidence of left ventricular regional wall motion abnormalities..  Right Ventricle: The right ventricle has normal systolic function. The cavity was normal. There is no increase in right ventricular wall thickness. Right ventricular systolic pressure could not be assessed.  Left Atrium: Left atrial size was normal in size.  Right Atrium: Right atrial size was normal in size.  Interatrial Septum: No atrial level shunt detected by color flow Doppler.  Pericardium: There is no evidence of pericardial effusion.  Mitral Valve: The mitral valve is myxomatous. Mild thickening of the mitral valve leaflet. Mitral valve regurgitation is not visualized by color flow Doppler. Prominent redundant  mitral valve chordae are seen.  Tricuspid Valve: The tricuspid valve is normal in structure. Tricuspid valve regurgitation was not visualized by color flow Doppler.  Aortic Valve: The aortic valve is tricuspid Mild thickening of the aortic valve. Mild calcification of the aortic valve. Aortic valve regurgitation was not visualized by color flow Doppler. There is no evidence of aortic valve stenosis.  Pulmonic Valve: The pulmonic valve was not well visualized. Pulmonic valve regurgitation is not visualized by color flow Doppler.  Aorta: The aorta is normal unless otherwise noted.  Venous: The inferior vena cava measures 1.90 cm, is normal in size with greater than 50% respiratory variability.   +--------------+--------++ LEFT VENTRICLE         +--------------+--------++ PLAX 2D                +--------------+--------++ LVIDd:        4.90 cm  +--------------+--------++ LVIDs:        3.20 cm  +--------------+--------++ LV PW:        0.90 cm  +--------------+--------++ LV IVS:       0.80 cm  +--------------+--------++ LVOT diam:    2.30 cm  +--------------+--------++ LV SV:        72 ml    +--------------+--------++ LV SV Index:  37.86    +--------------+--------++ LVOT Area:    4.15 cm +--------------+--------++                        +--------------+--------++  +---------------+-------++-----------++ LEFT ATRIUM           Index       +---------------+-------++-----------++ LA diam:       2.60 cm1.34 cm/m  +---------------+-------++-----------++ LA Vol (A2C):  36.5 ml18.77 ml/m +---------------+-------++-----------++ LA Vol (A4C):  23.0 ml11.83 ml/m +---------------+-------++-----------++ LA Biplane Vol:31.3 ml16.09 ml/m +---------------+-------++-----------++ +------------+---------++-----------++ RIGHT ATRIUM         Index       +------------+---------++-----------++ RA  Pressure:3.00 mmHg            +------------+---------++-----------++ RA Area:    10.50 cm            +------------+---------++-----------++ RA Volume:  24.80 ml 12.75 ml/m +------------+---------++-----------++ +------------+-----------++ AORTIC VALVE            +------------+-----------++ LVOT Vmax:  105.00 cm/s +------------+-----------++ LVOT Vmean: 64.200  cm/s +------------+-----------++ LVOT VTI:   0.195 m     +------------+-----------++  +-------------+-------++ AORTA                +-------------+-------++ Ao Root diam:3.70 cm +-------------+-------++ Ao Asc diam: 3.50 cm +-------------+-------++  +--------------+--------++   +---------------+---------++ MITRAL VALVE             TRICUSPID VALVE          +--------------+--------++   +---------------+---------++ MV Area (PHT):           Estimated RAP: 3.00 mmHg +--------------+--------++   +---------------+---------++                        +--------------+--------++   +--------------+-------+ MV Decel Time:372 msec   SHUNTS                +--------------+--------++   +--------------+-------+ +--------------+----------++ Systemic VTI: 0.20 m  MV E velocity:47.50 cm/s +--------------+-------+ +--------------+----------++ Systemic Diam:2.30 cm MV A velocity:55.30 cm/s +--------------+-------+ +--------------+----------++ MV E/A ratio: 0.86       +--------------+----------++  +---------+-------+ IVC              +---------+-------+ IVC diam:1.90 cm +---------+-------+   Thurmon Fair MD Electronically signed by Thurmon Fair MD Signature Date/Time: 10/09/2018/6:08:44 PM    Final              EKG:  EKG is ordered today.  The ekg ordered today demonstrates normal sinus rhythm 61 bpm, left axis deviation, otherwise normal  Recent Labs: 01/01/2022: ALT 35; BUN 22; Creatinine 0.97; Hemoglobin 16.7; Platelet  Count 137; Potassium 4.5; Sodium 141  Recent Lipid Panel    Component Value Date/Time   CHOL 117 10/16/2018 1125   CHOL 83 (L) 11/11/2017 1010   TRIG 69 10/16/2018 1125   HDL 35 (L) 10/16/2018 1125   HDL 23 (L) 11/11/2017 1010   CHOLHDL 3.3 10/16/2018 1125   VLDL 14 10/16/2018 1125   LDLCALC 68 10/16/2018 1125   LDLCALC 45 11/11/2017 1010     Risk Assessment/Calculations:                Physical Exam:    VS:  BP 100/68   Pulse 61   Ht 6\' 2"  (1.88 m)   Wt 179 lb 12.8 oz (81.6 kg)   SpO2 98%   BMI 23.08 kg/m     Wt Readings from Last 3 Encounters:  05/23/22 179 lb 12.8 oz (81.6 kg)  01/01/22 184 lb (83.5 kg)  05/11/21 184 lb 12.8 oz (83.8 kg)     GEN:  Well nourished, well developed in no acute distress HEENT: Normal NECK: No JVD; No carotid bruits LYMPHATICS: No lymphadenopathy CARDIAC: RRR, no murmurs, rubs, gallops RESPIRATORY:  Clear to auscultation without rales, wheezing or rhonchi  ABDOMEN: Soft, non-tender, non-distended MUSCULOSKELETAL:  No edema; No deformity  SKIN: Warm and dry NEUROLOGIC:  Alert and oriented x 3 PSYCHIATRIC:  Normal affect   ASSESSMENT:    1. Coronary artery disease involving native coronary artery of native heart without angina pectoris   2. Mixed hyperlipidemia    PLAN:    In order of problems listed above:  The patient continues to do well with no symptoms and an active lifestyle.  He is exercising regularly with no functional limitation.  He continues on aspirin for antiplatelet therapy and high intensity statin drug.  He will have labs at his annual physical which is coming up next month.  Continue current management.  I will  see him back in 1 year unless problems arise. Doing well with atorvastatin 80 mg.  His lipids been excellent with last lab values showing a total cholesterol 95, HDL 35, and LDL 43.           Medication Adjustments/Labs and Tests Ordered: Current medicines are reviewed at length with the patient  today.  Concerns regarding medicines are outlined above.  Orders Placed This Encounter  Procedures   EKG 12-Lead   No orders of the defined types were placed in this encounter.   Patient Instructions  Medication Instructions:  Your physician recommends that you continue on your current medications as directed. Please refer to the Current Medication list given to you today.  *If you need a refill on your cardiac medications before your next appointment, please call your pharmacy*   Lab Work: NONE If you have labs (blood work) drawn today and your tests are completely normal, you will receive your results only by: MyChart Message (if you have MyChart) OR A paper copy in the mail If you have any lab test that is abnormal or we need to change your treatment, we will call you to review the results.   Testing/Procedures: NONE   Follow-Up: At [facility], you and your health needs are our priority.  As part of our continuing mission to provide you with exceptional heart care, we have created designated Provider Care Teams.  These Care Teams include your primary Cardiologist (physician) and Advanced Practice Providers (APPs -  Physician Assistants and Nurse Practitioners) who all work together to provide you with the care you need, when you need it.  We recommend signing up for the patient portal called "MyChart".  Sign up information is provided on this After Visit Summary.  MyChart is used to connect with patients for Virtual Visits (Telemedicine).  Patients are able to view lab/test results, encounter notes, upcoming appointments, etc.  Non-urgent messages can be sent to your provider as well.   To learn more about what you can do with MyChart, go to [email or web address].    Your next appointment:   1 year(s)  Provider:   [name], MD        Signed, [name], MD  05/23/2022 4:50 PM    [facility]

## 2022-05-23 NOTE — Patient Instructions (Signed)
Medication Instructions:  Your physician recommends that you continue on your current medications as directed. Please refer to the Current Medication list given to you today.  *If you need a refill on your cardiac medications before your next appointment, please call your pharmacy*   Lab Work: NONE If you have labs (blood work) drawn today and your tests are completely normal, you will receive your results only by: MyChart Message (if you have MyChart) OR A paper copy in the mail If you have any lab test that is abnormal or we need to change your treatment, we will call you to review the results.   Testing/Procedures: NONE   Follow-Up: At [facility], you and your health needs are our priority.  As part of our continuing mission to provide you with exceptional heart care, we have created designated Provider Care Teams.  These Care Teams include your primary Cardiologist (physician) and Advanced Practice Providers (APPs -  Physician Assistants and Nurse Practitioners) who all work together to provide you with the care you need, when you need it.  We recommend signing up for the patient portal called "MyChart".  Sign up information is provided on this After Visit Summary.  MyChart is used to connect with patients for Virtual Visits (Telemedicine).  Patients are able to view lab/test results, encounter notes, upcoming appointments, etc.  Non-urgent messages can be sent to your provider as well.   To learn more about what you can do with MyChart, go to [email or web address].    Your next appointment:   1 year(s)  Provider:   Michael Cooper, MD      

## 2022-05-27 DIAGNOSIS — S91209A Unspecified open wound of unspecified toe(s) with damage to nail, initial encounter: Secondary | ICD-10-CM | POA: Diagnosis not present

## 2022-05-28 ENCOUNTER — Encounter: Payer: Self-pay | Admitting: Hematology

## 2022-06-06 DIAGNOSIS — L570 Actinic keratosis: Secondary | ICD-10-CM | POA: Diagnosis not present

## 2022-06-06 DIAGNOSIS — Z85828 Personal history of other malignant neoplasm of skin: Secondary | ICD-10-CM | POA: Diagnosis not present

## 2022-06-06 DIAGNOSIS — D225 Melanocytic nevi of trunk: Secondary | ICD-10-CM | POA: Diagnosis not present

## 2022-06-06 DIAGNOSIS — D2261 Melanocytic nevi of right upper limb, including shoulder: Secondary | ICD-10-CM | POA: Diagnosis not present

## 2022-06-06 DIAGNOSIS — D2262 Melanocytic nevi of left upper limb, including shoulder: Secondary | ICD-10-CM | POA: Diagnosis not present

## 2022-06-06 DIAGNOSIS — C44712 Basal cell carcinoma of skin of right lower limb, including hip: Secondary | ICD-10-CM | POA: Diagnosis not present

## 2022-06-06 DIAGNOSIS — L820 Inflamed seborrheic keratosis: Secondary | ICD-10-CM | POA: Diagnosis not present

## 2022-07-03 DIAGNOSIS — E7800 Pure hypercholesterolemia, unspecified: Secondary | ICD-10-CM | POA: Diagnosis not present

## 2022-07-03 DIAGNOSIS — Z23 Encounter for immunization: Secondary | ICD-10-CM | POA: Diagnosis not present

## 2022-07-03 DIAGNOSIS — Z0000 Encounter for general adult medical examination without abnormal findings: Secondary | ICD-10-CM | POA: Diagnosis not present

## 2022-07-03 DIAGNOSIS — N400 Enlarged prostate without lower urinary tract symptoms: Secondary | ICD-10-CM | POA: Diagnosis not present

## 2022-07-03 DIAGNOSIS — C9110 Chronic lymphocytic leukemia of B-cell type not having achieved remission: Secondary | ICD-10-CM | POA: Diagnosis not present

## 2022-07-03 DIAGNOSIS — R7301 Impaired fasting glucose: Secondary | ICD-10-CM | POA: Diagnosis not present

## 2022-07-03 DIAGNOSIS — Z125 Encounter for screening for malignant neoplasm of prostate: Secondary | ICD-10-CM | POA: Diagnosis not present

## 2022-07-03 DIAGNOSIS — I2510 Atherosclerotic heart disease of native coronary artery without angina pectoris: Secondary | ICD-10-CM | POA: Diagnosis not present

## 2022-07-05 ENCOUNTER — Encounter: Payer: Self-pay | Admitting: Cardiovascular Disease

## 2022-07-05 ENCOUNTER — Other Ambulatory Visit: Payer: Self-pay

## 2022-07-05 ENCOUNTER — Encounter: Payer: Self-pay | Admitting: Hematology

## 2022-07-05 ENCOUNTER — Other Ambulatory Visit: Payer: Self-pay | Admitting: Cardiovascular Disease

## 2022-07-05 DIAGNOSIS — I2510 Atherosclerotic heart disease of native coronary artery without angina pectoris: Secondary | ICD-10-CM

## 2022-07-05 MED ORDER — NITROGLYCERIN 0.4 MG SL SUBL: SUBLINGUAL_TABLET | SUBLINGUAL | 3 refills | Status: DC

## 2022-07-08 DIAGNOSIS — Z23 Encounter for immunization: Secondary | ICD-10-CM | POA: Diagnosis not present

## 2022-07-29 DIAGNOSIS — Z1211 Encounter for screening for malignant neoplasm of colon: Secondary | ICD-10-CM | POA: Diagnosis not present

## 2022-07-29 DIAGNOSIS — Z1212 Encounter for screening for malignant neoplasm of rectum: Secondary | ICD-10-CM | POA: Diagnosis not present

## 2022-08-02 LAB — COLOGUARD: COLOGUARD: NEGATIVE

## 2022-08-02 LAB — EXTERNAL GENERIC LAB PROCEDURE: COLOGUARD: NEGATIVE

## 2022-10-16 DIAGNOSIS — H442E3 Degenerative myopia with other maculopathy, bilateral eye: Secondary | ICD-10-CM | POA: Diagnosis not present

## 2022-10-16 DIAGNOSIS — H25813 Combined forms of age-related cataract, bilateral: Secondary | ICD-10-CM | POA: Diagnosis not present

## 2022-10-16 DIAGNOSIS — H35373 Puckering of macula, bilateral: Secondary | ICD-10-CM | POA: Diagnosis not present

## 2022-10-16 DIAGNOSIS — H43813 Vitreous degeneration, bilateral: Secondary | ICD-10-CM | POA: Diagnosis not present

## 2022-11-12 DIAGNOSIS — Z23 Encounter for immunization: Secondary | ICD-10-CM | POA: Diagnosis not present

## 2022-12-11 DIAGNOSIS — L570 Actinic keratosis: Secondary | ICD-10-CM | POA: Diagnosis not present

## 2022-12-11 DIAGNOSIS — L814 Other melanin hyperpigmentation: Secondary | ICD-10-CM | POA: Diagnosis not present

## 2022-12-11 DIAGNOSIS — L820 Inflamed seborrheic keratosis: Secondary | ICD-10-CM | POA: Diagnosis not present

## 2022-12-11 DIAGNOSIS — D044 Carcinoma in situ of skin of scalp and neck: Secondary | ICD-10-CM | POA: Diagnosis not present

## 2022-12-11 DIAGNOSIS — D2262 Melanocytic nevi of left upper limb, including shoulder: Secondary | ICD-10-CM | POA: Diagnosis not present

## 2022-12-11 DIAGNOSIS — Z85828 Personal history of other malignant neoplasm of skin: Secondary | ICD-10-CM | POA: Diagnosis not present

## 2022-12-30 ENCOUNTER — Other Ambulatory Visit: Payer: Self-pay

## 2022-12-30 DIAGNOSIS — C9110 Chronic lymphocytic leukemia of B-cell type not having achieved remission: Secondary | ICD-10-CM

## 2022-12-31 ENCOUNTER — Inpatient Hospital Stay: Payer: BC Managed Care – PPO | Attending: Hematology

## 2022-12-31 ENCOUNTER — Inpatient Hospital Stay: Payer: BC Managed Care – PPO | Admitting: Hematology

## 2022-12-31 ENCOUNTER — Encounter: Payer: Self-pay | Admitting: Hematology

## 2022-12-31 VITALS — BP 116/72 | HR 68 | Temp 97.9°F | Resp 20 | Wt 184.2 lb

## 2022-12-31 DIAGNOSIS — Z7982 Long term (current) use of aspirin: Secondary | ICD-10-CM | POA: Insufficient documentation

## 2022-12-31 DIAGNOSIS — R220 Localized swelling, mass and lump, head: Secondary | ICD-10-CM | POA: Diagnosis not present

## 2022-12-31 DIAGNOSIS — C9110 Chronic lymphocytic leukemia of B-cell type not having achieved remission: Secondary | ICD-10-CM | POA: Diagnosis not present

## 2022-12-31 DIAGNOSIS — E041 Nontoxic single thyroid nodule: Secondary | ICD-10-CM | POA: Diagnosis not present

## 2022-12-31 DIAGNOSIS — Z79899 Other long term (current) drug therapy: Secondary | ICD-10-CM | POA: Diagnosis not present

## 2022-12-31 LAB — CMP (CANCER CENTER ONLY)
ALT: 41 U/L (ref 0–44)
AST: 24 U/L (ref 15–41)
Albumin: 4.4 g/dL (ref 3.5–5.0)
Alkaline Phosphatase: 64 U/L (ref 38–126)
Anion gap: 3 — ABNORMAL LOW (ref 5–15)
BUN: 28 mg/dL — ABNORMAL HIGH (ref 8–23)
CO2: 32 mmol/L (ref 22–32)
Calcium: 9 mg/dL (ref 8.9–10.3)
Chloride: 106 mmol/L (ref 98–111)
Creatinine: 0.89 mg/dL (ref 0.61–1.24)
GFR, Estimated: >60 mL/min (ref >60)
Glucose, Bld: 76 mg/dL (ref 70–99)
Potassium: 4.6 mmol/L (ref 3.5–5.1)
Sodium: 141 mmol/L (ref 135–145)
Total Bilirubin: 0.9 mg/dL (ref <1.2)
Total Protein: 6.4 g/dL — ABNORMAL LOW (ref 6.5–8.1)

## 2022-12-31 LAB — CBC WITH DIFFERENTIAL (CANCER CENTER ONLY)
Abs Immature Granulocytes: 0.03 10*3/uL (ref 0.00–0.07)
Basophils Absolute: 0 10*3/uL (ref 0.0–0.1)
Basophils Relative: 1 %
Eosinophils Absolute: 0.1 10*3/uL (ref 0.0–0.5)
Eosinophils Relative: 3 %
HCT: 50.7 % (ref 39.0–52.0)
Hemoglobin: 16.9 g/dL (ref 13.0–17.0)
Immature Granulocytes: 1 %
Lymphocytes Relative: 18 %
Lymphs Abs: 0.8 10*3/uL (ref 0.7–4.0)
MCH: 31.2 pg (ref 26.0–34.0)
MCHC: 33.3 g/dL (ref 30.0–36.0)
MCV: 93.5 fL (ref 80.0–100.0)
Monocytes Absolute: 0.5 10*3/uL (ref 0.1–1.0)
Monocytes Relative: 12 %
Neutro Abs: 2.9 10*3/uL (ref 1.7–7.7)
Neutrophils Relative %: 65 %
Platelet Count: 128 10*3/uL — ABNORMAL LOW (ref 150–400)
RBC: 5.42 MIL/uL (ref 4.22–5.81)
RDW: 12.9 % (ref 11.5–15.5)
WBC Count: 4.3 10*3/uL (ref 4.0–10.5)
nRBC: 0 % (ref 0.0–0.2)

## 2022-12-31 LAB — LACTATE DEHYDROGENASE: LDH: 141 U/L (ref 98–192)

## 2022-12-31 NOTE — Progress Notes (Signed)
HEMATOLOGY/ONCOLOGY CLINIC NOTE  Date of Service:  12/31/2022  Patient Care Team: [name], MD as PCP - General (Internal Medicine) [name], MD as PCP - Cardiology (Cardiology)  CHIEF COMPLAINTS/PURPOSE OF CONSULTATION:  Chronic Lymphocytic Leukemia  Oncologic History:   Oncology History  Chronic lymphocytic leukemia (CLL), B-cell (HCC)  02/14/2015 Initial Diagnosis   Monoclonal B-cell population CD5 and CD20 positive, differential diagnosis CLL versus mantle cell lymphoma ( patient had absolute lymphocyte count of 8000 in 2010), remained stable.   07/09/2018 - 07/31/2018 Chemotherapy   Patient is on Treatment Plan : LEUKEMIA CLL Obinutuzumab q28d        HISTORY OF PRESENTING ILLNESS:  Please see previous note for details on initial presentation  Interval History:   Jake Kennedy returns for continued evaluation and management of his CLL.   Patient was last seen by me on 01/01/2022 and he was doing well overall.   Patient notes he has been doing well overall since our last visit. He denies any new infection issues, fever, chills, night sweats, abdominal pain, chest pain, back pain, unexpected weight loss, new lumps/bumps, or leg swelling. He still has a small tongue nodule which has Jake changed in size. Nodule is on the anterior right edge of the tongue.  He denies any infection in the past six months.   Patient new PCP is Dr. Renford Dills since his previous PCP, Dr. Kirby Funk, retired.   He is UTD with influenza vaccine and RSV vaccine. He has Jake had COVID-19 Booster.   He is occasionally drinks alcoholic beverage.   MEDICAL HISTORY:  Past Medical History:  Diagnosis Date   Basal cell carcinoma    moles removed in 2007 and 2012 from L nose and L hip   BPH (benign prostatic hypertrophy)    CAD (coronary artery disease), native coronary artery    a. 02/10/2015 95% mid LAD stenosis s/p DES  b. 07/15/16: LHC showed stable non obst dz and patent  stent   CLL (chronic lymphocytic leukemia) (HCC)    Coronary artery disease    ED (erectile dysfunction)    Lymphocytosis 02/11/2015   seen by Dr. Pamelia Hoit 02/11/2015, working up for possible CLL    SURGICAL HISTORY: Past Surgical History:  Procedure Laterality Date   CARDIAC CATHETERIZATION  02/10/2015   CARDIAC CATHETERIZATION N/A 02/10/2015   Procedure: Left Heart Cath and Coronary Angiography;  Surgeon: [name], MD;  Location: [facility] INVASIVE CV LAB;  Service: Cardiovascular;  Laterality: N/A;   CARDIAC CATHETERIZATION N/A 02/10/2015   Procedure: Coronary Stent Intervention;  Surgeon: [name], MD;  Location: [facility] INVASIVE CV LAB;  Service: Cardiovascular;  Laterality: N/A;   CORONARY STENT PLACEMENT  02/10/2015   DES to LAD   LAMINECTOMY     LEFT HEART CATH AND CORONARY ANGIOGRAPHY N/A 07/15/2016   Procedure: Left Heart Cath and Coronary Angiography;  Surgeon: Swazil[place], Peter M, MD;  Location: [facility] INVASIVE CV LAB;  Service: Cardiovascular;  Laterality: N/A;   TONSILLECTOMY     VIDEO BRONCHOSCOPY WITH ENDOBRONCHIAL NAVIGATION N/A 09/16/2018   Procedure: VIDEO BRONCHOSCOPY WITH ENDOBRONCHIAL NAVIGATION AND BIOPIES;  Surgeon: Leslye Peer, MD;  Location: [facility] OR;  Service: Thoracic;  Laterality: N/A;    SOCIAL HISTORY: Social History   Socioeconomic History   Marital status: Married    Spouse name: Jake Kennedy   Number of children: Jake Kennedy   Years of education: Jake Kennedy   Highest education level: Jake on  Kennedy  Occupational History   Jake Kennedy  Tobacco Use   Smoking status: Never   Smokeless tobacco: Never  Vaping Use   Vaping status: Never Used  Substance and Sexual Activity   Alcohol use: Yes    Alcohol/week: 0.0 standard drinks of alcohol    Comment: occasional   Drug use: No   Sexual activity: Jake Kennedy  Other Topics Concern   Jake Kennedy  Social History Narrative   Jake Kennedy   Social Determinants of Health   Financial Resource Strain: Jake Kennedy   Food Insecurity: Jake Kennedy  Transportation Needs: Jake Kennedy  Physical Activity: Jake Kennedy  Stress: Jake Kennedy  Social Connections: Jake Kennedy  Intimate Partner Violence: Jake Kennedy    FAMILY HISTORY: Family History  Problem Relation Age of Onset   Diabetes Mother    Heart failure Mother    Heart attack Mother        occured in 68s.    Healthy Sister    Healthy Brother    Healthy Maternal Grandmother    Healthy Maternal Grandfather    Healthy Paternal Grandmother    Healthy Paternal Grandfather    Healthy Brother     ALLERGIES:  is allergic to adhesive [tape].  MEDICATIONS:  Current Outpatient Medications  Medication Sig Dispense Refill   aspirin 81 MG tablet Take 81 mg by mouth daily.     atorvastatin (LIPITOR) 80 MG tablet TAKE 1 TABLET(80 MG) BY MOUTH DAILY AT 6 PM 90 tablet 3   Multiple Vitamin (MULTIVITAMIN) tablet Take 1 tablet by mouth daily.     Multiple Vitamins-Minerals (PRESERVISION AREDS PO) Take 2 capsules by mouth daily.     nitroGLYCERIN (NITROSTAT) 0.4 MG SL tablet PLACE 1 TABLET UNDER THE TONGUE EVERY 5 MINUTES AS NEEDED FOR CHEST PAIN 25 tablet 3   Omega-3 Fatty Acids (FISH OIL CONCENTRATE PO) Take 1 capsule by mouth daily.      sildenafil (VIAGRA) 100 MG tablet sildenafil 100 mg tablet     No current facility-administered medications for this visit.    REVIEW OF SYSTEMS:   .10 Point review of Systems was done is negative except as noted above.  PHYSICAL EXAMINATION: ECOG FS:1 - Symptomatic but completely ambulatory  Vitals:   12/31/22 0914  BP: 116/72  Pulse: 68  Resp: 20  Temp: 97.9 F (36.6 C)  SpO2: 99%    Wt Readings from Last 3 Encounters:  05/23/22 179 lb 12.8 oz (81.6 kg)  01/01/22 184 lb (83.5 kg)  05/11/21 184 lb 12.8 oz (83.8 kg)   Body mass index is 23.65 kg/m.   NAD GENERAL:alert, in no acute distress and comfortable SKIN: no acute rashes, no significant lesions EYES: conjunctiva are pink and non-injected,  sclera anicteric OROPHARYNX: MMM, no exudates, no oropharyngeal erythema or ulceration NECK: supple, no JVD LYMPH:  no palpable lymphadenopathy in the cervical, axillary or inguinal regions LUNGS: clear to auscultation b/l with normal respiratory effort HEART: regular rate & rhythm ABDOMEN:  normoactive bowel sounds , non tender, Jake distended. No palpable hepatosplenomegaly Extremity: no pedal edema PSYCH: alert & oriented x 3 with fluent speech NEURO: no focal motor/sensory deficits  LABORATORY DATA:  I have reviewed the data as listed  .    Latest Ref Rng & Units 12/31/2022    8:58 AM 01/01/2022    8:39 AM 05/08/2021    1:30 PM  CBC  WBC 4.0 - 10.5  K/uL 4.3  5.0  5.0   Hemoglobin 13.0 - 17.0 g/dL 86.5  78.4  69.6   Hematocrit 39.0 - 52.0 % 50.7  50.7  47.7   Platelets 150 - 400 K/uL 128  137  156    .    Latest Ref Rng & Units 12/31/2022    8:58 AM 01/01/2022    8:39 AM 05/08/2021    1:30 PM  CMP  Glucose 70 - 99 mg/dL 76  99  295   BUN 8 - 23 mg/dL 28  22  25    Creatinine 0.61 - 1.24 mg/dL 2.84  1.32  4.40   Sodium 135 - 145 mmol/L 141  141  140   Potassium 3.5 - 5.1 mmol/L 4.6  4.5  4.4   Chloride 98 - 111 mmol/L 106  106  105   CO2 22 - 32 mmol/L 32  31  29   Calcium 8.9 - 10.3 mg/dL 9.0  9.3  8.9   Total Protein 6.5 - 8.1 g/dL 6.4  6.3  6.2   Total Bilirubin <1.2 mg/dL 0.9  0.9  0.8   Alkaline Phos 38 - 126 U/L 64  64  56   AST 15 - 41 U/L 24  21  23    ALT 0 - 44 U/L 41  35  34    01/04/2019 [place] [facility] he says that leg pulls 5 minutes on top of like a lot of other things and so if it is normal on the floor things to create those findings within patient's and he just keep adding layers that they are related to this new system without really correcting it to be with her off of optimization was if there was anything that broke the flow of of design optimization it was needing less of that if it brings on the whole optimization process and so likely just like  continuously [place] the schedule can figure out what the letter is white lab reporting were Jake patient is to see if can do the schedulers will do Monoferric level that was already covered and lost, her recovery is working portal any concerns with bloating on the look ROID (Accession [id number])    04/30/18 Outside Labs:              Cytology done 05/25/2019 revealed "FINAL MICROSCOPIC DIAGNOSIS:  - No malignant cells identified"  RADIOGRAPHIC STUDIES: I have personally reviewed the radiological images as listed and agreed with the findings in the report. No results found.   ASSESSMENT & PLAN:   72 y.o. male with  1. Chronic Lymphocytic Leukemia Initial diagnosis on 02/13/15 Peripheral blood flow cytometry which revealed a Monoclonal B-cell population 09/25/15 Cytogenetics ruled out Mantle cell lymphoma. 12/25/17 FISH study ruled out translocation (11;14) 12/30/17 BM Biopsy revealed CLL without evidence of transformation  03/17/18 FISH CLL Prognostic panel revealed an 11q deletion and Trisomy 12  03/19/18 CT N/C/A/P revealed NECK: Multiple enlarged bilateral cervical lymph nodes consistent with the given diagnosis of CLL. The largest node is a right level 3 lymph node measuring 3.4 x 2.8 x 1.2 cm. 2. Degenerative changes of the cervical spine. 3. Hypodense lesion in the right side of the tongue measuring up to 1.5 cm. This could represent focal infection. Neoplasm is considered less likely. The lesion appears to be superficial along the right lateral aspect of the anterior tongue a. This should be amenable to direct inspection, C/A/P: Marked adenopathy within the chest, abdomen, and pelvis, consistent with  active lymphoma/leukemia. 2. Splenomegaly is nonspecific. Splenic involvement cannot be excluded. 3. Right greater than left and basilar predominant peribronchovascular interstitial thickening and nodularity. Considerations include pulmonary involvement of lymphoma and/or  Infection/aspiration. 4. Coronary artery atherosclerosis. Aortic Atherosclerosis. 5. Trace nonspecific pelvic fluid.  03/26/18 Left cervical LN biopsy revealed SLL/CLL  08/11/18 CT Neck revealed "Decreased cervical lymphadenopathy with all remaining nodes being subcentimeter in short axis. 2. Sinusitis."  08/11/18 CT Chest revealed "Interval improvement without resolution of the basilar predominant peripheral peribronchovascular nodularity. 2. Interval development of a new masslike consolidative opacity in the medial right lung measuring up to 3.3 cm. This may be infectious/inflammatory. Lymphoma involvement Jake excluded. 3. Interval resolution of splenomegaly with small volume perisplenic ascites identified today. 4.  Aortic Atherosclerosis."  04/16/19 CT Chest ([id number]) revealed "1. Overall today's study demonstrates improved appearance of the lungs with resolution of widespread thickening of the peribronchovascular interstitium as well as scattered areas of peribronchovascular micronodularity seen throughout the lungs bilaterally. Mild cylindrical bronchiectasis is again noted. 2. Stable 9 x 8 mm pulmonary nodule in the left lower lobe (axial image 126 of series 3), strongly favored to be benign."  #2 Thyroid nodule- appears benign on Ultrasound. 10/09/2018 US THYROID (Accession [id number]) revealed "1. Solitary 1.2 cm benign colloid containing cyst within the left lobe of the thyroid does Jake meet imaging criteria to recommend percutaneous sampling or continued dedicated follow-up. 2. Otherwise, normal thyroid ultrasound."  3. Tongue nodule PLAN: -Discussed lab results from today, 12/31/2022, in detail with the patient. CBC shows low platelets of 128 K. CMP pending.  -Recommend COVID-19 Booster and other age related vaccines.  -Discussed with the patient that we will get an CT scan if platelet drop below 100 K.  -Patient has no lab or clinical evidence of CLL recurrence/progression at this  time. -No indication for additional CLL treatment at this time. -Return to clinic with Dr. Candise Che with labs in 1 year.  -Patient will have labs with Dr. Nehemiah Settle in six months. He will let us know if platelets drops.  -Discussed the option of getting referred to ENT Physician regarding the tongue nodule, which has Jake changed in size. Nodule on the anterior right edge of the tongue. Patient agrees.  -Will refer the patient to ENT.  FOLLOW UP: -Chronic tongue nodule for further evaluation-- referral to ENT Dr Cameron Ali - labs with PCP in 6 months -RTC with Dr Candise Che with labs in 12 months  The total time spent in the appointment was 30 minutes* .  All of the patient's questions were answered with apparent satisfaction. The patient knows to call the clinic with any problems, questions or concerns.   Wyvonnia Lora MD MS AAHIVMS [facility] Hematology/Oncology Physician [facility]  .*Total Encounter Time as defined by the Centers for Medicare and Medicaid Services includes, in addition to the face-to-face time of a patient visit (documented in the note above) non-face-to-face time: obtaining and reviewing outside history, ordering and reviewing medications, tests or procedures, care coordination (communications with other health care professionals or caregivers) and documentation in the medical record.   I,Param Shah,acting as a Neurosurgeon for Wyvonnia Lora, MD.,have documented all relevant documentation on the behalf of Wyvonnia Lora, MD,as directed by  Wyvonnia Lora, MD while in the presence of Wyvonnia Lora, MD.  .I have reviewed the above documentation for accuracy and completeness, and I agree with the above. Johney Maine MD

## 2023-01-01 DIAGNOSIS — H6993 Unspecified Eustachian tube disorder, bilateral: Secondary | ICD-10-CM | POA: Diagnosis not present

## 2023-01-01 DIAGNOSIS — H6123 Impacted cerumen, bilateral: Secondary | ICD-10-CM | POA: Diagnosis not present

## 2023-01-01 DIAGNOSIS — H66001 Acute suppurative otitis media without spontaneous rupture of ear drum, right ear: Secondary | ICD-10-CM | POA: Diagnosis not present

## 2023-01-06 ENCOUNTER — Encounter: Payer: Self-pay | Admitting: Hematology

## 2023-01-14 ENCOUNTER — Encounter: Payer: Self-pay | Admitting: Hematology

## 2023-01-23 DIAGNOSIS — H6123 Impacted cerumen, bilateral: Secondary | ICD-10-CM | POA: Diagnosis not present

## 2023-01-23 DIAGNOSIS — K148 Other diseases of tongue: Secondary | ICD-10-CM | POA: Diagnosis not present

## 2023-02-04 DIAGNOSIS — J029 Acute pharyngitis, unspecified: Secondary | ICD-10-CM | POA: Diagnosis not present

## 2023-05-01 DIAGNOSIS — C4441 Basal cell carcinoma of skin of scalp and neck: Secondary | ICD-10-CM | POA: Diagnosis not present

## 2023-05-01 DIAGNOSIS — D2262 Melanocytic nevi of left upper limb, including shoulder: Secondary | ICD-10-CM | POA: Diagnosis not present

## 2023-05-01 DIAGNOSIS — L821 Other seborrheic keratosis: Secondary | ICD-10-CM | POA: Diagnosis not present

## 2023-05-01 DIAGNOSIS — D220 Melanocytic nevi of lip: Secondary | ICD-10-CM | POA: Diagnosis not present

## 2023-05-01 DIAGNOSIS — D692 Other nonthrombocytopenic purpura: Secondary | ICD-10-CM | POA: Diagnosis not present

## 2023-05-01 DIAGNOSIS — Z85828 Personal history of other malignant neoplasm of skin: Secondary | ICD-10-CM | POA: Diagnosis not present

## 2023-05-01 DIAGNOSIS — D485 Neoplasm of uncertain behavior of skin: Secondary | ICD-10-CM | POA: Diagnosis not present

## 2023-05-01 DIAGNOSIS — L570 Actinic keratosis: Secondary | ICD-10-CM | POA: Diagnosis not present

## 2023-05-25 NOTE — Progress Notes (Signed)
 Cardiology Office Note:    Date:  05/26/2023   ID:  Jake Kennedy, DOB 06/11/1950, MRN [id number]  PCP:  Merl Star, MD   [facility] Providers Cardiologist:  Arnoldo Lapping, MD     Referring MD: Ozell Blunt, MD   Chief Complaint  Patient presents with   Coronary Artery Disease    History of Present Illness:    Jake Kennedy is a 73 y.o. male with a hx of  coronary artery disease, presenting for follow-up evaluation.  The patient presented in 2016 with exertional angina.  He had a nuclear stress test that demonstrated severe ischemia.  Cardiac catheterization demonstrated moderate RCA stenosis and critical proximal LAD stenosis.  He was treated with PCI of the LAD with a drug-eluting stent.  He had recurrent angina the following year and cardiac catheterization demonstrated patency at his stent site and no change in the moderate nonobstructive RCA stenosis.  Ongoing medical therapy was recommended.   The patient is here alone today.  He has been doing well from a cardiac perspective.  He walks about 100,000 steps per week.  With that level of activity, he has no exertional symptoms.  He also rides a bike.  He specifically denies chest pain, chest pressure, or shortness of breath.  No heart palpitations.  He is compliant with his medications.  He reports easy bruising with aspirin .  No other complaints today.  Current Medications: Current Meds  Medication Sig   aspirin  81 MG tablet Take 81 mg by mouth daily.   Multiple Vitamin (MULTIVITAMIN) tablet Take 1 tablet by mouth daily.   Multiple Vitamins-Minerals (PRESERVISION AREDS PO) Take 2 capsules by mouth daily.   Omega-3 Fatty Acids (FISH OIL CONCENTRATE PO) Take 1 capsule by mouth daily.    sildenafil (VIAGRA) 100 MG tablet sildenafil 100 mg tablet   [DISCONTINUED] atorvastatin  (LIPITOR ) 80 MG tablet TAKE 1 TABLET(80 MG) BY MOUTH DAILY AT 6 PM   [DISCONTINUED] nitroGLYCERIN  (NITROSTAT ) 0.4 MG SL tablet PLACE 1  TABLET UNDER THE TONGUE EVERY 5 MINUTES AS NEEDED FOR CHEST PAIN     Allergies:   Adhesive [tape]   ROS:   Please see the history of present illness.    All other systems reviewed and are negative.  EKGs/Labs/Other Studies Reviewed:    The following studies were reviewed today: Cardiac Studies & Procedures   ______________________________________________________________________________________________ CARDIAC CATHETERIZATION  CARDIAC CATHETERIZATION 07/15/2016  Conclusion  Mid RCA lesion, 50 %stenosed.  Mid LAD-2 lesion, 0 %stenosed.  A drug eluting is in place  Mid LAD-1 lesion, 30 %stenosed.  1st Diag lesion, 30 %stenosed.  The left ventricular systolic function is normal.  LV end diastolic pressure is normal.  The left ventricular ejection fraction is 55-65% by visual estimate.  1. Nonobstructive CAD. The stent in the mid LAD is widely patent. There is 30% narrowing proximal to the stent. The RCA is a large and ectatic vessel with 50% stenosis in the mid vessel. This is unchanged from prior studies. 2. Normal LV function 3. Normal LVEDP.  Plan: no culprit lesion to explain his current chest pain. Recommend continued medical therapy.  Findings Coronary Findings Diagnostic  Dominance: Right  Left Main Vessel was injected. Vessel is normal in caliber. Vessel is angiographically normal.  Left Anterior Descending  Mid LAD-2 lesion with no stenosis was previously treated. The lesion is eccentric.  First Diagonal Branch  Ramus Intermedius Vessel is angiographically normal.  Left Circumflex Vessel is angiographically normal.  Right  Coronary Artery The vessel is moderately ectatic. Anterior take off The lesion is segmental.  Intervention  No interventions have been documented.   CARDIAC CATHETERIZATION  CARDIAC CATHETERIZATION 02/10/2015  Conclusion  Mid LAD-2 lesion, 95% stenosed. Post intervention, there is a 0% residual stenosis.  Mid LAD-1  lesion, 30% stenosed.  1st Diag lesion, 30% stenosed.  1. Severe LAD stenosis, treated successfully with PCI using a drug-eluting stent platform 2. Moderate diffuse RCA stenosis, recommend medical therapy 3. Patent left circumflex without significant stenosis  Recommend dual antiplatelet therapy with aspirin  and Plavix  for 12 months without interruption. The patient needs aggressive risk reduction. A high-dose statin drug will be initiated.  Findings Coronary Findings Diagnostic  Dominance: Right  Left Anterior Descending  Eccentric.  First Diagonal Branch  Ramus Intermedius . Vessel is angiographically normal.  Left Circumflex . Vessel is angiographically normal.  Right Coronary Artery Diffuse.  Intervention  Mid LAD-2 lesion PCI The pre-interventional distal flow is normal (TIMI 3). Pre-stent angioplasty was performed. A drug-eluting stent was placed. Post-stent angioplasty was performed. Maximum pressure: 16 atm. The post-interventional distal flow is normal (TIMI 3). The intervention was successful. No complications occurred at this lesion. There is 95% eccentric mid LAD stenosis. This is a large vessel that supplies the apex and wraps around to supply the inferoapex. Heparin  is used for anticoagulation. A therapeutic ACT was achieved. The patient is preloaded with clopidogrel  600 mg on the table. An XB LAD 3.5 cm guide catheter was used. A cougar guidewire was advanced beyond the lesion. The lesion is predilated with a 2.5 mm balloon, stented with a 3.0 x 16 mm Promus DES, and the stent was postdilated with a 3.25 mm noncompliant balloon to 16 atm. There is a 0% residual stenosis post intervention.   STRESS TESTS  MYOCARDIAL PERFUSION IMAGING 02/02/2015  Narrative  The left ventricular ejection fraction is mildly decreased (45-54%).  Nuclear stress EF: 52%. There is hypokinesis in the basal inferior, inferolateral and mid inferolateral walls.  Upsloping ST segment  depression ST segment depression was noted during stress in the II, III, V5 and V6 leads, and returning to baseline after less than 1 minute of recovery.  Defect 1: There is a large defect of severe severity present in the basal inferoseptal, basal inferior, basal inferolateral, mid inferoseptal, mid inferior, mid inferolateral, apical anterior, apical septal, apical inferior and apex location.  Findings consistent with prior myocardial infarction with peri-infarct ischemia.  This is a high risk study.  This is a high risk study, there is a medium size infarct in the basal and mid inferior, inferolateral walls with a large area, severe severity ischemia extending into basal and mid inferoseptal, and apical inferior, septal, anterior and in the true apex (dominant RCA territory or multivessel disease). SDS 14.  The patient is scheduled for a left cardiac cath on Januay 3, 2017.   ECHOCARDIOGRAM  ECHOCARDIOGRAM COMPLETE 10/09/2018  Narrative ECHOCARDIOGRAM REPORT    Patient Name:   Jake Kennedy Date of Exam: 10/09/2018 Medical Rec #:  [id number]     Height:       74.0 in Accession #:    [id number]    Weight:       154.0 lb Date of Birth:  [birth year]     BSA:          1.94 m Patient Age:    68 years      BP:           106/68 mmHg  Patient Gender: M             HR:           77 bpm. Exam Location:  [address]   Procedure: 2D Echo, Color Doppler and Cardiac Doppler  Indications:    R06.02  History:        Patient has prior history of Echocardiogram examinations, most recent 02/28/2015. CAD Signs/Symptoms: Shortness of Breath Risk Factors: Dyslipidemia.  Sonographer:    Lula Sale RDCS Referring Phys: [phone] Markeita Alicia  IMPRESSIONS   1. The left ventricle has normal systolic function, with an ejection fraction of 55-60%. The cavity size was normal. Left ventricular diastolic Doppler parameters are consistent with impaired relaxation. No evidence of left ventricular regional  wall motion abnormalities. 2. The right ventricle has normal systolic function. The cavity was normal. There is no increase in right ventricular wall thickness. Right ventricular systolic pressure could not be assessed. 3. The mitral valve is myxomatous. Mild thickening of the mitral valve leaflet. 4. The aortic valve is tricuspid. Mild thickening of the aortic valve. Mild calcification of the aortic valve. 5. The aorta is normal unless otherwise noted.  FINDINGS Left Ventricle: The left ventricle has normal systolic function, with an ejection fraction of 55-60%. The cavity size was normal. There is no increase in left ventricular wall thickness. Left ventricular diastolic Doppler parameters are consistent with impaired relaxation. Normal left ventricular filling pressures No evidence of left ventricular regional wall motion abnormalities..  Right Ventricle: The right ventricle has normal systolic function. The cavity was normal. There is no increase in right ventricular wall thickness. Right ventricular systolic pressure could not be assessed.  Left Atrium: Left atrial size was normal in size.  Right Atrium: Right atrial size was normal in size.  Interatrial Septum: No atrial level shunt detected by color flow Doppler.  Pericardium: There is no evidence of pericardial effusion.  Mitral Valve: The mitral valve is myxomatous. Mild thickening of the mitral valve leaflet. Mitral valve regurgitation is not visualized by color flow Doppler. Prominent redundant mitral valve chordae are seen.  Tricuspid Valve: The tricuspid valve is normal in structure. Tricuspid valve regurgitation was not visualized by color flow Doppler.  Aortic Valve: The aortic valve is tricuspid Mild thickening of the aortic valve. Mild calcification of the aortic valve. Aortic valve regurgitation was not visualized by color flow Doppler. There is no evidence of aortic valve stenosis.  Pulmonic Valve: The pulmonic valve was  not well visualized. Pulmonic valve regurgitation is not visualized by color flow Doppler.  Aorta: The aorta is normal unless otherwise noted.  Venous: The inferior vena cava measures 1.90 cm, is normal in size with greater than 50% respiratory variability.   +--------------+--------++ LEFT VENTRICLE         +--------------+--------++ PLAX 2D                +--------------+--------++ LVIDd:        4.90 cm  +--------------+--------++ LVIDs:        3.20 cm  +--------------+--------++ LV PW:        0.90 cm  +--------------+--------++ LV IVS:       0.80 cm  +--------------+--------++ LVOT diam:    2.30 cm  +--------------+--------++ LV SV:        72 ml    +--------------+--------++ LV SV Index:  37.86    +--------------+--------++ LVOT Area:    4.15 cm +--------------+--------++                        +--------------+--------++  +---------------+-------++-----------++  LEFT ATRIUM           Index       +---------------+-------++-----------++ LA diam:       2.60 cm1.34 cm/m  +---------------+-------++-----------++ LA Vol (A2C):  36.5 ml18.77 ml/m +---------------+-------++-----------++ LA Vol (A4C):  23.0 ml11.83 ml/m +---------------+-------++-----------++ LA Biplane Vol:31.3 ml16.09 ml/m +---------------+-------++-----------++ +------------+---------++-----------++ RIGHT ATRIUM         Index       +------------+---------++-----------++ RA Pressure:3.00 mmHg            +------------+---------++-----------++ RA Area:    10.50 cm            +------------+---------++-----------++ RA Volume:  24.80 ml 12.75 ml/m +------------+---------++-----------++ +------------+-----------++ AORTIC VALVE            +------------+-----------++ LVOT Vmax:  105.00 cm/s +------------+-----------++ LVOT Vmean: 64.200 cm/s +------------+-----------++ LVOT VTI:   0.195 m      +------------+-----------++  +-------------+-------++ AORTA                +-------------+-------++ Ao Root diam:3.70 cm +-------------+-------++ Ao Asc diam: 3.50 cm +-------------+-------++  +--------------+--------++   +---------------+---------++ MITRAL VALVE             TRICUSPID VALVE          +--------------+--------++   +---------------+---------++ MV Area (PHT):           Estimated RAP: 3.00 mmHg +--------------+--------++   +---------------+---------++                        +--------------+--------++   +--------------+-------+ MV Decel Time:372 msec   SHUNTS                +--------------+--------++   +--------------+-------+ +--------------+----------++ Systemic VTI: 0.20 m  MV E velocity:47.50 cm/s +--------------+-------+ +--------------+----------++ Systemic Diam:2.30 cm MV A velocity:55.30 cm/s +--------------+-------+ +--------------+----------++ MV E/A ratio: 0.86       +--------------+----------++  +---------+-------+ IVC              +---------+-------+ IVC diam:1.90 cm +---------+-------+   Luana Rumple MD Electronically signed by Luana Rumple MD Signature Date/Time: 10/09/2018/6:08:44 PM    Final          ______________________________________________________________________________________________      EKG:   EKG Interpretation Date/Time:  Monday May 26 2023 08:01:31 EDT Ventricular Rate:  53 PR Interval:  200 QRS Duration:  120 QT Interval:  424 QTC Calculation: 397 R Axis:   -63  Text Interpretation: Sinus bradycardia Left anterior fascicular block Minimal voltage criteria for LVH, may be normal variant ( Cornell product ) When compared with ECG of 14-Jul-2016 05:54, No significant change was found Confirmed by Arnoldo Lapping [phone]) on 05/26/2023 8:15:07 AM    Recent Labs: 12/31/2022: ALT 41; BUN 28; Creatinine 0.89; Hemoglobin 16.9; Platelet Count 128;  Potassium 4.6; Sodium 141  Recent Lipid Panel    Component Value Date/Time   CHOL 117 10/16/2018 1125   CHOL 83 (L) 11/11/2017 1010   TRIG 69 10/16/2018 1125   HDL 35 (L) 10/16/2018 1125   HDL 23 (L) 11/11/2017 1010   CHOLHDL 3.3 10/16/2018 1125   VLDL 14 10/16/2018 1125   LDLCALC 68 10/16/2018 1125   LDLCALC 45 11/11/2017 1010     Risk Assessment/Calculations:                Physical Exam:    VS:  BP 108/60   Pulse (!) 53   Ht 6\' 2"  (1.88 m)   Wt 182 lb (82.6 kg)   SpO2  96%   BMI 23.37 kg/m     Wt Readings from Last 3 Encounters:  05/26/23 182 lb (82.6 kg)  12/31/22 184 lb 3.2 oz (83.6 kg)  05/23/22 179 lb 12.8 oz (81.6 kg)     GEN:  Well nourished, well developed in no acute distress HEENT: Normal NECK: No JVD; No carotid bruits LYMPHATICS: No lymphadenopathy CARDIAC: RRR, no murmurs, rubs, gallops RESPIRATORY:  Clear to auscultation without rales, wheezing or rhonchi  ABDOMEN: Soft, non-tender, non-distended MUSCULOSKELETAL:  No edema; No deformity  SKIN: Warm and dry NEUROLOGIC:  Alert and oriented x 3 PSYCHIATRIC:  Normal affect   Assessment & Plan Coronary artery disease involving native coronary artery of native heart without angina pectoris Stable without angina.  Continue aspirin  and a statin drug.  Follow-up 1 year. Mixed hyperlipidemia Follows an excellent diet and exercise regimen.  Most recent labs with cholesterol 92, HDL 34, LDL 40, triglycerides.  Continue current management.            Medication Adjustments/Labs and Tests Ordered: Current medicines are reviewed at length with the patient today.  Concerns regarding medicines are outlined above.  Orders Placed This Encounter  Procedures   EKG 12-Lead   Meds ordered this encounter  Medications   nitroGLYCERIN  (NITROSTAT ) 0.4 MG SL tablet    Sig: PLACE 1 TABLET UNDER THE TONGUE EVERY 5 MINUTES AS NEEDED FOR CHEST PAIN    Dispense:  25 tablet    Refill:  3   atorvastatin   (LIPITOR ) 80 MG tablet    Sig: Take 1 tablet (80 mg total) by mouth daily.    Dispense:  90 tablet    Refill:  3    Patient Instructions   Follow-Up: At [facility], you and your health needs are our priority.  As part of our continuing mission to provide you with exceptional heart care, our providers are all part of one team.  This team includes your primary Cardiologist (physician) and Advanced Practice Providers or APPs (Physician Assistants and Nurse Practitioners) who all work together to provide you with the care you need, when you need it.  Your next appointment:   1 year(s)  Provider:   Arnoldo Lapping, MD       1st Floor: - Lobby - Registration  - Pharmacy  - Lab - Cafe  2nd Floor: - PV Lab - Diagnostic Testing (echo, CT, nuclear med)  3rd Floor: - Vacant  4th Floor: - TCTS (cardiothoracic surgery) - AFib Clinic - Structural Heart Clinic - Vascular Surgery  - Vascular Ultrasound  5th Floor: - HeartCare Cardiology (general and EP) - Clinical Pharmacy for coumadin, hypertension, lipid, weight-loss medications, and med management appointments    Valet parking services will be available as well.     Signed, Arnoldo Lapping, MD  05/26/2023 1:40 PM    [facility]

## 2023-05-26 ENCOUNTER — Encounter: Payer: Self-pay | Admitting: Cardiovascular Disease

## 2023-05-26 ENCOUNTER — Ambulatory Visit: Payer: BC Managed Care – PPO | Attending: Cardiovascular Disease | Admitting: Cardiovascular Disease

## 2023-05-26 VITALS — BP 108/60 | HR 53 | Ht 74.0 in | Wt 182.0 lb

## 2023-05-26 DIAGNOSIS — E782 Mixed hyperlipidemia: Secondary | ICD-10-CM

## 2023-05-26 DIAGNOSIS — I2510 Atherosclerotic heart disease of native coronary artery without angina pectoris: Secondary | ICD-10-CM | POA: Diagnosis not present

## 2023-05-26 MED ORDER — ATORVASTATIN CALCIUM 80 MG PO TABS: 80.0000 mg | ORAL_TABLET | Freq: Every day | ORAL | 3 refills | Status: AC

## 2023-05-26 MED ORDER — NITROGLYCERIN 0.4 MG SL SUBL: SUBLINGUAL_TABLET | SUBLINGUAL | 3 refills | Status: AC

## 2023-05-26 NOTE — Assessment & Plan Note (Addendum)
 Follows an excellent diet and exercise regimen.  Most recent labs with cholesterol 92, HDL 34, LDL 40, triglycerides.  Continue current management.

## 2023-05-26 NOTE — Patient Instructions (Signed)
 Follow-Up: At [facility], you and your health needs are our priority.  As part of our continuing mission to provide you with exceptional heart care, our providers are all part of one team.  This team includes your primary Cardiologist (physician) and Advanced Practice Providers or APPs (Physician Assistants and Nurse Practitioners) who all work together to provide you with the care you need, when you need it.  Your next appointment:   1 year(s)  Provider:   [name], MD        1st Floor: - Lobby - Registration  - Pharmacy  - Lab - Cafe  2nd Floor: - PV Lab - Diagnostic Testing (echo, CT, nuclear med)  3rd Floor: - Vacant  4th Floor: - TCTS (cardiothoracic surgery) - AFib Clinic - Structural Heart Clinic - Vascular Surgery  - Vascular Ultrasound  5th Floor: - HeartCare Cardiology (general and EP) - Clinical Pharmacy for coumadin, hypertension, lipid, weight-loss medications, and med management appointments    Valet parking services will be available as well.

## 2023-05-26 NOTE — Assessment & Plan Note (Signed)
 Stable without angina.  Continue aspirin  and a statin drug.  Follow-up 1 year.

## 2023-08-13 DIAGNOSIS — Z125 Encounter for screening for malignant neoplasm of prostate: Secondary | ICD-10-CM | POA: Diagnosis not present

## 2023-08-13 DIAGNOSIS — N529 Male erectile dysfunction, unspecified: Secondary | ICD-10-CM | POA: Diagnosis not present

## 2023-08-13 DIAGNOSIS — C9110 Chronic lymphocytic leukemia of B-cell type not having achieved remission: Secondary | ICD-10-CM | POA: Diagnosis not present

## 2023-08-13 DIAGNOSIS — C4491 Basal cell carcinoma of skin, unspecified: Secondary | ICD-10-CM | POA: Diagnosis not present

## 2023-08-13 DIAGNOSIS — Z0000 Encounter for general adult medical examination without abnormal findings: Secondary | ICD-10-CM | POA: Diagnosis not present

## 2023-08-13 DIAGNOSIS — N400 Enlarged prostate without lower urinary tract symptoms: Secondary | ICD-10-CM | POA: Diagnosis not present

## 2023-08-18 ENCOUNTER — Encounter: Payer: Self-pay | Admitting: Cardiovascular Disease

## 2023-08-18 ENCOUNTER — Encounter: Payer: Self-pay | Admitting: Hematology

## 2023-08-20 ENCOUNTER — Encounter: Payer: Self-pay | Admitting: Internal Medicine

## 2023-08-21 DIAGNOSIS — Z125 Encounter for screening for malignant neoplasm of prostate: Secondary | ICD-10-CM | POA: Diagnosis not present

## 2023-10-02 DIAGNOSIS — N401 Enlarged prostate with lower urinary tract symptoms: Secondary | ICD-10-CM | POA: Diagnosis not present

## 2023-10-02 DIAGNOSIS — R351 Nocturia: Secondary | ICD-10-CM | POA: Diagnosis not present

## 2023-10-02 DIAGNOSIS — R350 Frequency of micturition: Secondary | ICD-10-CM | POA: Diagnosis not present

## 2023-10-02 DIAGNOSIS — R972 Elevated prostate specific antigen [PSA]: Secondary | ICD-10-CM | POA: Diagnosis not present

## 2023-10-08 ENCOUNTER — Encounter: Payer: Self-pay | Admitting: Urology

## 2023-10-08 ENCOUNTER — Other Ambulatory Visit: Payer: Self-pay | Admitting: Urology

## 2023-10-08 DIAGNOSIS — R972 Elevated prostate specific antigen [PSA]: Secondary | ICD-10-CM

## 2023-10-24 ENCOUNTER — Other Ambulatory Visit

## 2023-11-06 ENCOUNTER — Ambulatory Visit
Admission: RE | Admit: 2023-11-06 | Discharge: 2023-11-06 | Disposition: A | Discharge status: Home / Self Care | Source: Ambulatory Visit | Attending: Urology | Admitting: Urology

## 2023-11-06 DIAGNOSIS — R972 Elevated prostate specific antigen [PSA]: Secondary | ICD-10-CM

## 2023-11-06 DIAGNOSIS — N402 Nodular prostate without lower urinary tract symptoms: Secondary | ICD-10-CM | POA: Diagnosis not present

## 2023-11-06 MED ORDER — GADOPICLENOL 0.5 MMOL/ML IV SOLN
8.5000 mL | Freq: Once | INTRAVENOUS | Status: AC | PRN
  Administered 2023-11-06: 8.5 mL via INTRAVENOUS

## 2023-11-10 DIAGNOSIS — H43813 Vitreous degeneration, bilateral: Secondary | ICD-10-CM | POA: Diagnosis not present

## 2023-11-10 DIAGNOSIS — H442E3 Degenerative myopia with other maculopathy, bilateral eye: Secondary | ICD-10-CM | POA: Diagnosis not present

## 2023-11-10 DIAGNOSIS — H25813 Combined forms of age-related cataract, bilateral: Secondary | ICD-10-CM | POA: Diagnosis not present

## 2023-11-10 DIAGNOSIS — H35373 Puckering of macula, bilateral: Secondary | ICD-10-CM | POA: Diagnosis not present

## 2023-11-20 DIAGNOSIS — R972 Elevated prostate specific antigen [PSA]: Secondary | ICD-10-CM | POA: Diagnosis not present

## 2023-11-20 DIAGNOSIS — R3912 Poor urinary stream: Secondary | ICD-10-CM | POA: Diagnosis not present

## 2023-11-20 DIAGNOSIS — R3914 Feeling of incomplete bladder emptying: Secondary | ICD-10-CM | POA: Diagnosis not present

## 2023-11-20 DIAGNOSIS — N401 Enlarged prostate with lower urinary tract symptoms: Secondary | ICD-10-CM | POA: Diagnosis not present

## 2023-12-02 DIAGNOSIS — D225 Melanocytic nevi of trunk: Secondary | ICD-10-CM | POA: Diagnosis not present

## 2023-12-02 DIAGNOSIS — D485 Neoplasm of uncertain behavior of skin: Secondary | ICD-10-CM | POA: Diagnosis not present

## 2023-12-02 DIAGNOSIS — Z85828 Personal history of other malignant neoplasm of skin: Secondary | ICD-10-CM | POA: Diagnosis not present

## 2023-12-02 DIAGNOSIS — L570 Actinic keratosis: Secondary | ICD-10-CM | POA: Diagnosis not present

## 2023-12-02 DIAGNOSIS — L821 Other seborrheic keratosis: Secondary | ICD-10-CM | POA: Diagnosis not present

## 2023-12-02 DIAGNOSIS — L820 Inflamed seborrheic keratosis: Secondary | ICD-10-CM | POA: Diagnosis not present

## 2023-12-02 DIAGNOSIS — C44612 Basal cell carcinoma of skin of right upper limb, including shoulder: Secondary | ICD-10-CM | POA: Diagnosis not present

## 2023-12-02 DIAGNOSIS — D692 Other nonthrombocytopenic purpura: Secondary | ICD-10-CM | POA: Diagnosis not present

## 2023-12-08 DIAGNOSIS — N401 Enlarged prostate with lower urinary tract symptoms: Secondary | ICD-10-CM | POA: Diagnosis not present

## 2023-12-16 DIAGNOSIS — L988 Other specified disorders of the skin and subcutaneous tissue: Secondary | ICD-10-CM | POA: Diagnosis not present

## 2023-12-16 DIAGNOSIS — D485 Neoplasm of uncertain behavior of skin: Secondary | ICD-10-CM | POA: Diagnosis not present

## 2023-12-22 DIAGNOSIS — R3911 Hesitancy of micturition: Secondary | ICD-10-CM | POA: Diagnosis not present

## 2023-12-22 DIAGNOSIS — R351 Nocturia: Secondary | ICD-10-CM | POA: Diagnosis not present

## 2023-12-22 DIAGNOSIS — N401 Enlarged prostate with lower urinary tract symptoms: Secondary | ICD-10-CM | POA: Diagnosis not present

## 2023-12-22 DIAGNOSIS — R3915 Urgency of urination: Secondary | ICD-10-CM | POA: Diagnosis not present

## 2023-12-29 NOTE — Progress Notes (Signed)
 HEMATOLOGY ONCOLOGY PROGRESS NOTE  Date of service: 12/31/2023  Patient Care Team: Rexanne Ingle, MD as PCP - General (Internal Medicine) Wonda Sharper, MD as PCP - Cardiology (Cardiology)  CHIEF COMPLAINT/PURPOSE OF CONSULTATION: Follow-up for continued evaluation and management of Chronic Lymphocytic Leukemia   HISTORY OF PRESENTING ILLNESS: (03/25/2018) Jake Kennedy is a wonderful 73 y.o. male who has been referred to us  by Dr. Norleen Lewis for evaluation and management of Chronic Lymphocytic Leukemia. The pt was formerly under the care of my colleague Dr. Vinay Gudena. He is accompanied today by his wife. The pt reports that he is doing well overall.   The pt reports that his course of CLL had been pretty consistent since diagnosis in 2017, until roughly 3 months ago in roughly November 2019. He notes that he has had recent increase in fatigue in the last 3 months, and more acutely in the last 3 weeks. He endorses occasional fevers over the last two weeks during the night, highest at 100.2 or 100.3. He also endorses some night sweats, not every night, some of these being drenching. He notes that he has been eating well for the last 6 months, however has lost 8 pounds in that interim. He endorses regular exercising until the past month, due to his worsened fatigue, and had previously regularly run upwards of 3 miles and bike for several miles as well.   The pt notes that he can't smell very well, in the last 2-3 weeks, and his hearing has also reduced in the last week. He senses that his enlarged lymph nodes are pressing on his neck, and characterizes this as similar to when one has an acute cold and congestion. He had acute pain in his right hip a week ago and saw my colleague Fleeta Bertin, PA in Symptom Managment. He was given Norco but denies taking this because he didn't want to. He endorses a positional element to his hip pain, which e attributes to the pain being from an enlarged lymph  node. He notes that his hip pain had sudden onset on the night of 03/18/18. He notes that his neck lymph nodes have grown in the last 3 weeks to a month. The pt notes that his lymph nodes in his neck, armpits, and groin have been enlarged.   The pt tried an antibiotic course 2 weeks ago with his PCP for a suspected sinus infection, during which his cervical lymph nodes enlarged further. He notes that his neck lymph nodes are somewhat improved today as compared to this. The pt denies frequent infections.    Of note prior to the patient's visit today, pt has had CT N/C/A/P completed on 03/19/18 with results revealing NECK: Multiple enlarged bilateral cervical lymph nodes consistent with the given diagnosis of CLL. The largest node is a right level 3 lymph node measuring 3.4 x 2.8 x 1.2 cm. 2. Degenerative changes of the cervical spine. 3. Hypodense lesion in the right side of the tongue measuring up to 1.5 cm. This could represent focal infection. Neoplasm is considered less likely. The lesion appears to be superficial along the right lateral aspect of the anterior tongue a. This should be amenable to direct inspection, C/A/P: Marked adenopathy within the chest, abdomen, and pelvis, consistent with active lymphoma/leukemia. 2. Splenomegaly is nonspecific. Splenic involvement cannot be excluded. 3. Right greater than left and basilar predominant peribronchovascular interstitial thickening and nodularity. Considerations include pulmonary involvement of lymphoma and/or Infection/aspiration. 4. Coronary artery atherosclerosis. Aortic Atherosclerosis. 5. Trace  nonspecific pelvic fluid.   Most recent lab results (03/17/18) of CBC w/diff and CMP is as follows: all values are WNL except for WBC at 13.1k, RBC at 2.67, HGB at 10.1, HCT at 31.0, MCV at 116.1, MCH at 37.8, PLT at 103k, ANC at 900, Lymphs abs at 11.1k, BUN at 31, Calcium  at 8.7, Total Protein at 6.2, Albumin at 3.4. 03/17/18 LDH at 241   On review of systems,  pt reports new fatigue, recent fevers, recent night sweats, recently grown neck lymph nodes, eating well, some weight loss, right groin pain, and denies chills, SOB, ear pain, pain along the spine, frequent infections, flank pain, leg swelling, testicular pain or swelling, and any other symptoms.  SUMMARY OF ONCOLOGIC HISTORY: Oncology History  Chronic lymphocytic leukemia (CLL), B-cell (HCC)  02/14/2015 Initial Diagnosis   Monoclonal B-cell population CD5 and CD20 positive, differential diagnosis CLL versus mantle cell lymphoma ( patient had absolute lymphocyte count of 8000 in 2010), remained stable.   07/09/2018 - 07/31/2018 Chemotherapy   Patient is on Treatment Plan : LEUKEMIA CLL Obinutuzumab  [id number]      INTERVAL HISTORY: Jake Kennedy is a 73 y.o. male who is here today for continued evaluation and management of Chronic Lymphocytic Leukemia.   he was last seen by me on 12/31/2022; at the time he did not have any concerns and was doing well.   Today, he says that he's been feeling well.   He notes having to see Urology for an enlarged prostate and PSA elevated at 5.6. He endorses nocturia. He apparently is also not completely voiding during urination according to Urologist.  He reports that at his recent dental appointment, who expressed concern that his tongue nodule has grown. His ENT was Dr. Ethyl, who he last saw in January of 2021. He had a biopsy in 05/2019, which was benign. He has reportedly seen Dr. Jesus recently. Denies affecting speech or providing discomfort.   Denies any fevers/chills, drenching night sweats, new lumps/bumps, or change in breathing. Denies smoking or alcohol use. Says that he has been discharged from Dr. Shelah, Pulmonology.   REVIEW OF SYSTEMS:   10 Point review of systems of done and is negative except as noted above.  MEDICAL HISTORY Past Medical History:  Diagnosis Date   Basal cell carcinoma    moles removed in 2007 and 2012 from L nose and L hip    BPH (benign prostatic hypertrophy)    CAD (coronary artery disease), native coronary artery    a. 02/10/2015 95% mid LAD stenosis s/p DES  b. 07/15/16: LHC showed stable non obst dz and patent stent   CLL (chronic lymphocytic leukemia) (HCC)    Coronary artery disease    ED (erectile dysfunction)    Lymphocytosis 02/11/2015   seen by Dr. Odean 02/11/2015, working up for possible CLL    SURGICAL HISTORY Past Surgical History:  Procedure Laterality Date   CARDIAC CATHETERIZATION  02/10/2015   CARDIAC CATHETERIZATION N/A 02/10/2015   Procedure: Left Heart Cath and Coronary Angiography;  Surgeon: Ozell Fell, MD;  Location: [facility] INVASIVE CV LAB;  Service: Cardiovascular;  Laterality: N/A;   CARDIAC CATHETERIZATION N/A 02/10/2015   Procedure: Coronary Stent Intervention;  Surgeon: Ozell Fell, MD;  Location: [facility] INVASIVE CV LAB;  Service: Cardiovascular;  Laterality: N/A;   CORONARY STENT PLACEMENT  02/10/2015   DES to LAD   LAMINECTOMY     LEFT HEART CATH AND CORONARY ANGIOGRAPHY N/A 07/15/2016   Procedure: Left Heart Cath and  Coronary Angiography;  Surgeon: Jordan, Peter M, MD;  Location: [facility] INVASIVE CV LAB;  Service: Cardiovascular;  Laterality: N/A;   TONSILLECTOMY     VIDEO BRONCHOSCOPY WITH ENDOBRONCHIAL NAVIGATION N/A 09/16/2018   Procedure: VIDEO BRONCHOSCOPY WITH ENDOBRONCHIAL NAVIGATION AND BIOPIES;  Surgeon: Shelah Lamar RAMAN, MD;  Location: [facility] OR;  Service: Thoracic;  Laterality: N/A;    SOCIAL HISTORY Social History   Tobacco Use   Smoking status: Never   Smokeless tobacco: Never  Vaping Use   Vaping status: Never Used  Substance Use Topics   Alcohol use: Yes    Alcohol/week: 0.0 standard drinks of alcohol    Comment: occasional   Drug use: No    Social History   Social History Narrative   Not on file    SOCIAL DRIVERS OF HEALTH SDOH Screenings   Tobacco Use: Low Risk  (05/26/2023)     FAMILY HISTORY Family History  Problem Relation Age of Onset   Diabetes  Mother    Heart failure Mother    Heart attack Mother        occured in 12s.    Healthy Sister    Healthy Brother    Healthy Maternal Grandmother    Healthy Maternal Grandfather    Healthy Paternal Grandmother    Healthy Paternal Grandfather    Healthy Brother      ALLERGIES: is allergic to adhesive [tape].  MEDICATIONS  Current Outpatient Medications  Medication Sig Dispense Refill   aspirin  81 MG tablet Take 81 mg by mouth daily.     atorvastatin  (LIPITOR ) 80 MG tablet Take 1 tablet (80 mg total) by mouth daily. 90 tablet 3   Multiple Vitamin (MULTIVITAMIN) tablet Take 1 tablet by mouth daily.     Multiple Vitamins-Minerals (PRESERVISION AREDS PO) Take 2 capsules by mouth daily.     nitroGLYCERIN  (NITROSTAT ) 0.4 MG SL tablet PLACE 1 TABLET UNDER THE TONGUE EVERY 5 MINUTES AS NEEDED FOR CHEST PAIN 25 tablet 3   Omega-3 Fatty Acids (FISH OIL CONCENTRATE PO) Take 1 capsule by mouth daily.      sildenafil (VIAGRA) 100 MG tablet sildenafil 100 mg tablet     No current facility-administered medications for this visit.    PHYSICAL EXAMINATION: ECOG PERFORMANCE STATUS: 1 - Symptomatic but completely ambulatory VITALS: Vitals:   12/31/23 0921  BP: 103/73  Pulse: 71  Resp: 20  Temp: 97.7 F (36.5 C)  SpO2: 96%   Filed Weights   12/31/23 0921  Weight: 182 lb 9.6 oz (82.8 kg)   Body mass index is 23.44 kg/m.  GENERAL: alert, in no acute distress and comfortable SKIN: no acute rashes, no significant lesions EYES: conjunctiva are pink and non-injected, sclera anicteric OROPHARYNX: MMM, no exudates, no oropharyngeal erythema or ulceration NECK: supple, no JVD LYMPH:  no palpable lymphadenopathy in the cervical, axillary or inguinal regions LUNGS: clear to auscultation b/l with normal respiratory effort HEART: regular rate & rhythm ABDOMEN:  normoactive bowel sounds , non tender, not distended, no hepatosplenomegaly Extremity: no pedal edema PSYCH: alert & oriented x 3  with fluent speech NEURO: no focal motor/sensory deficits  LABORATORY DATA:   I have reviewed the data as listed     Latest Ref Rng & Units 12/31/2023    8:47 AM 12/31/2022    8:58 AM 01/01/2022    8:39 AM  CBC EXTENDED  WBC 4.0 - 10.5 K/uL 4.9  4.3  5.0   RBC 4.22 - 5.81 MIL/uL 5.38  5.42  5.39  Hemoglobin 13.0 - 17.0 g/dL 83.1  83.0  83.2   HCT 39.0 - 52.0 % 50.3  50.7  50.7   Platelets 150 - 400 K/uL 127  128  137   NEUT# 1.7 - 7.7 K/uL 3.1  2.9  3.4   Lymph# 0.7 - 4.0 K/uL 1.3  0.8  0.8    LDH:  Lab Results  Component Value Date   LDH 168 12/31/2023        Latest Ref Rng & Units 12/31/2023    8:47 AM 12/31/2022    8:58 AM 01/01/2022    8:39 AM  CMP  Glucose 70 - 99 mg/dL 84  76  99   BUN 8 - 23 mg/dL 22  28  22    Creatinine 0.61 - 1.24 mg/dL 9.13  9.10  9.02   Sodium 135 - 145 mmol/L 142  141  141   Potassium 3.5 - 5.1 mmol/L 4.3  4.6  4.5   Chloride 98 - 111 mmol/L 105  106  106   CO2 22 - 32 mmol/L 29  32  31   Calcium  8.9 - 10.3 mg/dL 9.3  9.0  9.3   Total Protein 6.5 - 8.1 g/dL 6.4  6.4  6.3   Total Bilirubin 0.0 - 1.2 mg/dL 0.7  0.9  0.9   Alkaline Phos 38 - 126 U/L 67  64  64   AST 15 - 41 U/L 31  24  21    ALT 0 - 44 U/L 47  41  35    01/04/2019 US  THY clinic he says that leg pulls 5 minutes on top of like a lot of other things and so if it is normal on the floor things to create those findings within patient's and he just keep adding layers that they are related to this new system without really correcting it to be with her off of optimization was if there was anything that broke the flow of of design optimization it was needing less of that if it brings on the whole optimization process and so likely just like continuously Gladding the schedule can figure out what the letter is white lab reporting were not patient is to see if can do the schedulers will do Monoferric level that was already covered and lost, her recovery is working portal any concerns with  bloating on the look ROID (Accession [id number])     04/30/18 Outside Labs:                     Cytology done 05/25/2019 revealed FINAL MICROSCOPIC DIAGNOSIS:  - No malignant cells identified  RADIOGRAPHIC STUDIES: I have personally reviewed the radiological images as listed and agreed with the findings in the report. MR PROSTATE W WO CONTRAST Result Date: 11/07/2023 EXAM: MRI PROSTATE WITH AND WITHOUT INTRAVENOUS CONTRAST 11/06/2023 11:30:29 AM TECHNIQUE: Multiparametric MRI imaging of the prostate with and without dynamic contrast enhanced imaging and diffusion weighted imaging was performed. Dynacad was utilized in analysis of images. COMPARISON: None available. CLINICAL HISTORY: Dx: Elevated PSA x 2 months; Vueway  10 mL vial used; 8.5 mL administered; 2.5 mL wasted; PSA: 4.3, 5.62; No path; Pt did prep last night and this morning; Pt used restroom prior to exam here twice. Elevated PSA x 2 months. FINDINGS: PROSTATE: Prostate volume is approximately 17.5 mL. Heterogeneous signal intensity within the peripheral sinus on T2 weighted imaging. No focal lesion identified. No foci of restricted diffusion within the peripheral zone. No suspicious enhancement  pattern on postcontrast T1 weighted imaging. The transitional zone is enlarged by well capsulated nodules. PI-RADS 2. SEMINAL VESICLES: Unremarkable. NEUROVASCULAR BUNDLE: Unremarkable. LYMPHADENOPATHY: No lymphadenopathy. BLADDER: The bladder is unremarkable. BOWEL: The visualized bowel is without acute abnormality. PERITONEAL CAVITY: No free fluid. BONES: Normal bone marrow signal intensity. No suspicious or aggressive osseous lesions. SOFT TISSUES: No focal abnormality. IMPRESSION: 1. No MRI findings to suggest clinically significant prostate cancer (PI-RADS 2). 2. Benign prostatic hyperplasia with enlarged transition zone composed of well-capsulated nodules. 3. Prostate volume approximately 93 mL (ellipsoid formula using 6.1 x 5.1 x 5.7  cm). Electronically signed by: Norleen Boxer MD 11/07/2023 10:31 AM EDT RP Workstation: [id number]    ASSESSMENT & PLAN:   73 y.o. male with  1. Chronic Lymphocytic Leukemia Initial diagnosis on 02/13/15 Peripheral blood flow cytometry which revealed a Monoclonal B-cell population 09/25/15 Cytogenetics ruled out Mantle cell lymphoma. 12/25/17 FISH study ruled out translocation (11;14) 12/30/17 BM Biopsy revealed CLL without evidence of transformation  03/17/18 FISH CLL Prognostic panel revealed an 11q deletion and Trisomy 12   03/19/18 CT N/C/A/P revealed NECK: Multiple enlarged bilateral cervical lymph nodes consistent with the given diagnosis of CLL. The largest node is a right level 3 lymph node measuring 3.4 x 2.8 x 1.2 cm. 2. Degenerative changes of the cervical spine. 3. Hypodense lesion in the right side of the tongue measuring up to 1.5 cm. This could represent focal infection. Neoplasm is considered less likely. The lesion appears to be superficial along the right lateral aspect of the anterior tongue a. This should be amenable to direct inspection, C/A/P: Marked adenopathy within the chest, abdomen, and pelvis, consistent with active lymphoma/leukemia. 2. Splenomegaly is nonspecific. Splenic involvement cannot be excluded. 3. Right greater than left and basilar predominant peribronchovascular interstitial thickening and nodularity. Considerations include pulmonary involvement of lymphoma and/or Infection/aspiration. 4. Coronary artery atherosclerosis. Aortic Atherosclerosis. 5. Trace nonspecific pelvic fluid.   03/26/18 Left cervical LN biopsy revealed SLL/CLL   08/11/18 CT Neck revealed Decreased cervical lymphadenopathy with all remaining nodes being subcentimeter in short axis. 2. Sinusitis.   08/11/18 CT Chest revealed Interval improvement without resolution of the basilar predominant peripheral peribronchovascular nodularity. 2. Interval development of a new masslike consolidative opacity  in the medial right lung measuring up to 3.3 cm. This may be infectious/inflammatory. Lymphoma involvement not excluded. 3. Interval resolution of splenomegaly with small volume perisplenic ascites identified today. 4.  Aortic Atherosclerosis.   04/16/19 CT Chest ([id number]) revealed 1. Overall today's study demonstrates improved appearance of the lungs with resolution of widespread thickening of the peribronchovascular interstitium as well as scattered areas of peribronchovascular micronodularity seen throughout the lungs bilaterally. Mild cylindrical bronchiectasis is again noted. 2. Stable 9 x 8 mm pulmonary nodule in the left lower lobe (axial image 126 of series 3), strongly favored to be benign.   #2 Thyroid  nodule- appears benign on Ultrasound. 10/09/2018 US  THYROID  (Accession [id number]) revealed 1. Solitary 1.2 cm benign colloid containing cyst within the left lobe of the thyroid  does not meet imaging criteria to recommend percutaneous sampling or continued dedicated follow-up. 2. Otherwise, normal thyroid  ultrasound.   3. Tongue nodule  PLAN: - Discussed lab results on 12/31/2023 in detail with patient: CBC showed WBC of 4.9K, Hemoglobin of 16.8, and PLTs of 127K decreased from 128K.  Baseline thrombocytopenia.  CMP stable.  LDH: 168. - no lab or clinical evidence of significant CLL progression at this time. No indication for treatment.  - Reviewed 11/07/2023 Prostate MRI: 1.  No MRI findings to suggest clinically significant prostate cancer (PI-RADS 2).  2. Benign prostatic hyperplasia with enlarged transition zone composed of well-capsulated nodules.  3. Prostate volume approximately 93 mL (ellipsoid formula using 6.1 x 5.1 x 5.7 cm).    Discussed BPH and potential side effects of leaving untreated, such as bladder thickening.    -continue f/u with Urology.  - Discussed connecting with ENT for evaluation of tongue nodule.- given referral to Dr Jesus.  Examined nodule, which  seemed cyst-like, seems to have grown slightly.   FOLLOW-UP   -Chronic tongue nodule for further evaluation-- referral to ENT Dr Juliane Jesus - labs with PCP in 6 months -RTC with Dr Onesimo with labs in 12 months    The total time spent in the appointment was 30 minutes* .  All of the patient's questions were answered and the patient knows to call the clinic with any problems, questions, or concerns.  Emaline Onesimo MD MS AAHIVMS [facility] Hematology/Oncology Physician [facility]  *Total Encounter Time as defined by the Centers for Medicare and Medicaid Services includes, in addition to the face-to-face time of a patient visit (documented in the note above) non-face-to-face time: obtaining and reviewing outside history, ordering and reviewing medications, tests or procedures, care coordination (communications with other health care professionals or caregivers) and documentation in the medical record.  I,Emily Lagle,acting as a neurosurgeon for Emaline Onesimo, MD.,have documented all relevant documentation on the behalf of Emaline Onesimo, MD,as directed by  Emaline Onesimo, MD while in the presence of Emaline Onesimo, MD.  I have reviewed the above documentation for accuracy and completeness, and I agree with the above.  Yenesis Even, MD

## 2023-12-30 ENCOUNTER — Other Ambulatory Visit: Payer: Self-pay

## 2023-12-30 DIAGNOSIS — C9110 Chronic lymphocytic leukemia of B-cell type not having achieved remission: Secondary | ICD-10-CM

## 2023-12-31 ENCOUNTER — Other Ambulatory Visit: Payer: Self-pay

## 2023-12-31 ENCOUNTER — Inpatient Hospital Stay: Payer: BC Managed Care – PPO | Attending: Hematology

## 2023-12-31 ENCOUNTER — Inpatient Hospital Stay: Payer: BC Managed Care – PPO | Admitting: Hematology

## 2023-12-31 VITALS — BP 103/73 | HR 71 | Temp 97.7°F | Resp 20 | Wt 182.6 lb

## 2023-12-31 DIAGNOSIS — C9110 Chronic lymphocytic leukemia of B-cell type not having achieved remission: Secondary | ICD-10-CM

## 2023-12-31 DIAGNOSIS — N401 Enlarged prostate with lower urinary tract symptoms: Secondary | ICD-10-CM | POA: Diagnosis not present

## 2023-12-31 DIAGNOSIS — E041 Nontoxic single thyroid nodule: Secondary | ICD-10-CM | POA: Diagnosis not present

## 2023-12-31 DIAGNOSIS — R220 Localized swelling, mass and lump, head: Secondary | ICD-10-CM

## 2023-12-31 DIAGNOSIS — K148 Other diseases of tongue: Secondary | ICD-10-CM | POA: Diagnosis not present

## 2023-12-31 DIAGNOSIS — D696 Thrombocytopenia, unspecified: Secondary | ICD-10-CM | POA: Diagnosis not present

## 2023-12-31 LAB — CMP (CANCER CENTER ONLY)
ALT: 47 U/L — ABNORMAL HIGH (ref 0–44)
AST: 31 U/L (ref 15–41)
Albumin: 4.5 g/dL (ref 3.5–5.0)
Alkaline Phosphatase: 67 U/L (ref 38–126)
Anion gap: 8 (ref 5–15)
BUN: 22 mg/dL (ref 8–23)
CO2: 29 mmol/L (ref 22–32)
Calcium: 9.3 mg/dL (ref 8.9–10.3)
Chloride: 105 mmol/L (ref 98–111)
Creatinine: 0.86 mg/dL (ref 0.61–1.24)
GFR, Estimated: >60 mL/min (ref >60)
Glucose, Bld: 84 mg/dL (ref 70–99)
Potassium: 4.3 mmol/L (ref 3.5–5.1)
Sodium: 142 mmol/L (ref 135–145)
Total Bilirubin: 0.7 mg/dL (ref 0.0–1.2)
Total Protein: 6.4 g/dL — ABNORMAL LOW (ref 6.5–8.1)

## 2023-12-31 LAB — CBC WITH DIFFERENTIAL (CANCER CENTER ONLY)
Abs Immature Granulocytes: 0.03 K/uL (ref 0.00–0.07)
Basophils Absolute: 0.1 K/uL (ref 0.0–0.1)
Basophils Relative: 1 %
Eosinophils Absolute: 0.1 K/uL (ref 0.0–0.5)
Eosinophils Relative: 2 %
HCT: 50.3 % (ref 39.0–52.0)
Hemoglobin: 16.8 g/dL (ref 13.0–17.0)
Immature Granulocytes: 1 %
Lymphocytes Relative: 27 %
Lymphs Abs: 1.3 K/uL (ref 0.7–4.0)
MCH: 31.2 pg (ref 26.0–34.0)
MCHC: 33.4 g/dL (ref 30.0–36.0)
MCV: 93.5 fL (ref 80.0–100.0)
Monocytes Absolute: 0.4 K/uL (ref 0.1–1.0)
Monocytes Relative: 7 %
Neutro Abs: 3.1 K/uL (ref 1.7–7.7)
Neutrophils Relative %: 62 %
Platelet Count: 127 K/uL — ABNORMAL LOW (ref 150–400)
RBC: 5.38 MIL/uL (ref 4.22–5.81)
RDW: 12.4 % (ref 11.5–15.5)
WBC Count: 4.9 K/uL (ref 4.0–10.5)
nRBC: 0 % (ref 0.0–0.2)

## 2023-12-31 LAB — LACTATE DEHYDROGENASE: LDH: 168 U/L (ref 105–235)

## 2024-01-06 ENCOUNTER — Encounter: Payer: Self-pay | Admitting: Hematology

## 2024-01-12 NOTE — Progress Notes (Signed)
 Referral sent to Dr. Jesus per Dr. Onesimo

## 2024-05-27 ENCOUNTER — Ambulatory Visit: Admitting: Cardiology

## 2025-01-05 ENCOUNTER — Inpatient Hospital Stay: Admitting: Hematology

## 2025-01-05 ENCOUNTER — Inpatient Hospital Stay
